# Patient Record
Sex: Male | Born: 1940
Health system: Southern US, Community
[De-identification: ages and names within clinical notes are randomized; demographics above are authoritative.]

## PROBLEM LIST (undated history)

## (undated) DIAGNOSIS — M4300 Spondylolysis, site unspecified: Secondary | ICD-10-CM

## (undated) DIAGNOSIS — M1990 Unspecified osteoarthritis, unspecified site: Secondary | ICD-10-CM

## (undated) DIAGNOSIS — C801 Malignant (primary) neoplasm, unspecified: Secondary | ICD-10-CM

## (undated) DIAGNOSIS — Z87442 Personal history of urinary calculi: Secondary | ICD-10-CM

## (undated) DIAGNOSIS — J189 Pneumonia, unspecified organism: Secondary | ICD-10-CM

## (undated) DIAGNOSIS — K219 Gastro-esophageal reflux disease without esophagitis: Secondary | ICD-10-CM

## (undated) DIAGNOSIS — T4145XA Adverse effect of unspecified anesthetic, initial encounter: Secondary | ICD-10-CM

## (undated) DIAGNOSIS — I2510 Atherosclerotic heart disease of native coronary artery without angina pectoris: Secondary | ICD-10-CM

## (undated) DIAGNOSIS — K469 Unspecified abdominal hernia without obstruction or gangrene: Secondary | ICD-10-CM

## (undated) DIAGNOSIS — Z8489 Family history of other specified conditions: Secondary | ICD-10-CM

## (undated) DIAGNOSIS — N289 Disorder of kidney and ureter, unspecified: Secondary | ICD-10-CM

## (undated) DIAGNOSIS — K5909 Other constipation: Secondary | ICD-10-CM

## (undated) DIAGNOSIS — T8859XA Other complications of anesthesia, initial encounter: Secondary | ICD-10-CM

## (undated) DIAGNOSIS — R519 Headache, unspecified: Secondary | ICD-10-CM

## (undated) DIAGNOSIS — R0600 Dyspnea, unspecified: Secondary | ICD-10-CM

## (undated) DIAGNOSIS — E785 Hyperlipidemia, unspecified: Secondary | ICD-10-CM

## (undated) DIAGNOSIS — I739 Peripheral vascular disease, unspecified: Secondary | ICD-10-CM

## (undated) DIAGNOSIS — E119 Type 2 diabetes mellitus without complications: Secondary | ICD-10-CM

## (undated) DIAGNOSIS — I10 Essential (primary) hypertension: Secondary | ICD-10-CM

## (undated) DIAGNOSIS — R350 Frequency of micturition: Secondary | ICD-10-CM

## (undated) DIAGNOSIS — J449 Chronic obstructive pulmonary disease, unspecified: Secondary | ICD-10-CM

## (undated) HISTORY — DX: Unspecified abdominal hernia without obstruction or gangrene: K46.9

## (undated) HISTORY — DX: Malignant (primary) neoplasm, unspecified: C80.1

## (undated) HISTORY — PX: KIDNEY STONE SURGERY: SHX686

## (undated) HISTORY — PX: NEPHRECTOMY: SHX65

## (undated) HISTORY — DX: Frequency of micturition: R35.0

## (undated) HISTORY — DX: Essential (primary) hypertension: I10

## (undated) HISTORY — DX: Hyperlipidemia, unspecified: E78.5

## (undated) HISTORY — DX: Gastro-esophageal reflux disease without esophagitis: K21.9

## (undated) HISTORY — DX: Type 2 diabetes mellitus without complications: E11.9

## (undated) HISTORY — PX: COLONOSCOPY: SHX174

## (undated) HISTORY — DX: Spondylolysis, site unspecified: M43.00

## (undated) HISTORY — PX: HERNIA REPAIR: SHX51

---
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
## 2009-05-12 DIAGNOSIS — C029 Malignant neoplasm of tongue, unspecified: Secondary | ICD-10-CM

## 2009-05-12 HISTORY — DX: Malignant neoplasm of tongue, unspecified: C02.9

## 2009-12-07 HISTORY — PX: PARTIAL GLOSSECTOMY: SHX2173

## 2011-05-13 DIAGNOSIS — C801 Malignant (primary) neoplasm, unspecified: Secondary | ICD-10-CM

## 2011-05-13 HISTORY — DX: Malignant (primary) neoplasm, unspecified: C80.1

## 2011-10-08 HISTORY — PX: NEPHRECTOMY: SHX65

## 2011-12-29 ENCOUNTER — Encounter: Payer: Self-pay | Admitting: Sports Medicine

## 2011-12-29 ENCOUNTER — Ambulatory Visit (INDEPENDENT_AMBULATORY_CARE_PROVIDER_SITE_OTHER): Payer: Self-pay | Admitting: Sports Medicine

## 2011-12-29 VITALS — BP 111/64 | HR 70 | Temp 97.9°F | Resp 18 | Wt 240.0 lb

## 2011-12-29 DIAGNOSIS — IMO0001 Reserved for inherently not codable concepts without codable children: Secondary | ICD-10-CM

## 2011-12-29 DIAGNOSIS — M47816 Spondylosis without myelopathy or radiculopathy, lumbar region: Secondary | ICD-10-CM | POA: Insufficient documentation

## 2011-12-29 DIAGNOSIS — M5136 Other intervertebral disc degeneration, lumbar region: Secondary | ICD-10-CM | POA: Insufficient documentation

## 2011-12-29 MED ORDER — TRAMADOL-ACETAMINOPHEN 37.5-325 MG PO TABS: 1.0000 | ORAL_TABLET | Freq: Four times a day (QID) | ORAL | Status: AC | PRN

## 2011-12-29 MED ORDER — CYCLOBENZAPRINE HCL 10 MG PO TABS: ORAL_TABLET | ORAL | Status: AC

## 2011-12-29 NOTE — Assessment & Plan Note (Signed)
Muscle spasm, known lumbar spondylosis, I do suspect he has an element of degenerative disc disease as well. Trigger point injection as above. Ultracet for use as needed. Given him some rehabilitation exercises. Also like him to use short-term disability, and avoid heavy lifting for the next 3-4 weeks.  See him back in 3-4 weeks, and if no better we'll talk about MRI for interventional injection planning.
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
## 2012-01-23 ENCOUNTER — Encounter: Payer: Self-pay | Admitting: Sports Medicine

## 2012-01-23 ENCOUNTER — Ambulatory Visit (INDEPENDENT_AMBULATORY_CARE_PROVIDER_SITE_OTHER): Payer: 59 | Admitting: Sports Medicine

## 2012-01-23 VITALS — BP 123/65 | HR 102 | Temp 97.4°F | Wt 236.0 lb

## 2012-01-23 DIAGNOSIS — IMO0001 Reserved for inherently not codable concepts without codable children: Secondary | ICD-10-CM

## 2012-01-23 NOTE — Assessment & Plan Note (Signed)
Pain is gone. He wishes to go back to full-time work. His boss does agree that he will back him off in terms of time, in intensity. He may come back to see me on an as-needed basis, and if his pain comes back, I would recommend an MRI for interventional injection planning.

## 2012-01-23 NOTE — Progress Notes (Signed)
Patient ID: Clinton Ortiz, male   DOB: [birth year], 71 y.o.   MRN: [id number] Subjective:    CC: Followup back pain  HPI: Clinton Ortiz comes back to see me for follow up of back pain. I saw him approximately 4 weeks ago in the several trigger point injections. I also placed him on some analgesics, and gave him some rehabilitation exercises to do. Overall he notes his pain is essentially gone. He is ready to go back to work. He does note, that his boss will allow him to cut back on his hours, and the intensity.  Past medical history, Surgical history, Family history, Social history, Allergies, and medications have been entered into the medical record, reviewed, and no changes needed.   Review of Systems: No fevers, chills, night sweats, weight loss, chest pain, or shortness of breath.   Objective:    General: Well Developed, well nourished, and in no acute distress.  Neuro: Alert and oriented x3, extra-ocular muscles intact.  HEENT: Normocephalic, atraumatic, pupils equal round reactive to light, neck supple, no masses, no lymphadenopathy, thyroid nonpalpable.  Skin: Warm and dry, no rashes. Cardiac: Regular rate and rhythm, no murmurs rubs or gallops.  Respiratory: Clear to auscultation bilaterally. Not using accessory muscles, speaking in full sentences. Back Exam:  Inspection: Unremarkable  Motion: Flexion 45 deg, Extension 45 deg, Side Bending to 45 deg bilaterally,  Rotation to 45 deg bilaterally  SLR laying: Negative  XSLR laying: Negative  Palpable tenderness: None. FABER: negative. Sensory change: Gross sensation intact to all lumbar and sacral dermatomes.  Reflexes: 2+ at both patellar tendons, 2+ at achilles tendons, Babinski's downgoing.  Strength at foot  Plantar-flexion: 5/5 Dorsi-flexion: 5/5 Eversion: 5/5 Inversion: 5/5  Leg strength  Quad: 5/5 Hamstring: 5/5 Hip flexor: 5/5 Hip abductors: 5/5  Gait unremarkable.   Impression and Recommendations:

## 2012-07-03 ENCOUNTER — Emergency Department
Admission: EM | Admit: 2012-07-03 | Discharge: 2012-07-03 | Disposition: A | Discharge status: Home / Self Care | Payer: 59 | Source: Home / Self Care | Attending: Family Medicine | Admitting: Family Medicine

## 2012-07-03 DIAGNOSIS — J069 Acute upper respiratory infection, unspecified: Secondary | ICD-10-CM

## 2012-07-03 MED ORDER — DOXYCYCLINE HYCLATE 100 MG PO CAPS: 100.0000 mg | ORAL_CAPSULE | Freq: Two times a day (BID) | ORAL | Status: DC

## 2012-07-03 MED ORDER — BENZONATATE 100 MG PO CAPS: ORAL_CAPSULE | ORAL | Status: DC

## 2012-07-03 NOTE — ED Notes (Signed)
Started with headache yesterday now noted with sore throat this am

## 2012-07-03 NOTE — ED Provider Notes (Signed)
History     CSN: [id number]  Arrival date & time 07/03/12  1236   First MD Initiated Contact with Patient 07/03/12 1307      Chief Complaint  Patient presents with  . Sore Throat       HPI Comments: Patient developed cold-like symptoms yesterday with sore throat, nasal congestion, mild cough, myalgias, and headache.  He has had two episodes of pneumonia in the past.  The history is provided by the patient.    Past Medical History  Diagnosis Date  . Hyperlipidemia   . Hypertension   . Hernia   . Spondylolysis   . Frequent urination   . Cancer     left kidney    Past Surgical History  Procedure Laterality Date  . Nephrectomy      left  . Hernia repair      Family History  Problem Relation Age of Onset  . Cancer Mother     bladder  . Heart attack Father     History  Substance Use Topics  . Smoking status: Former Smoker -- 1.00 packs/day for 2 years    Types: Cigarettes  . Smokeless tobacco: Not on file  . Alcohol Use: No      Review of Systems + sore throat + cough No pleuritic pain No wheezing + nasal congestion + post-nasal drainage No sinus pain/pressure No itchy/red eyes No earache No hemoptysis + SOB No fever/chills No nausea No vomiting No abdominal pain No diarrhea No urinary symptoms No skin rashes + fatigue + myalgias + headache    Allergies  Prednisone  Home Medications   Current Outpatient Rx  Name  Route  Sig  Dispense  Refill  . traMADol (ULTRAM) 50 MG tablet   Oral   Take 25 mg by mouth every 6 (six) hours as needed for pain.         Marland Kitchen amLODipine-olmesartan (AZOR) 10-40 MG per tablet   Oral   Take 1 tablet by mouth daily.         Marland Kitchen aspirin 81 MG tablet   Oral   Take 81 mg by mouth daily.         . benzonatate (TESSALON) 100 MG capsule      Take one cap at bedtime as necessary for cough   12 capsule   0   . doxycycline (VIBRAMYCIN) 100 MG capsule   Oral   Take 1 capsule (100 mg total) by mouth 2  (two) times daily.   20 capsule   0   . Multiple Vitamins-Minerals (MULTIVITAMIN WITH MINERALS) tablet   Oral   Take 1 tablet by mouth daily.         . pravastatin (PRAVACHOL) 40 MG tablet   Oral   Take 40 mg by mouth daily.         . Tamsulosin HCl (FLOMAX) 0.4 MG CAPS   Oral   Take by mouth.           BP 119/75  Pulse 77  Temp(Src) 97.8 F (36.6 C) (Oral)  Ht 5\' 7"  (1.702 m)  Wt 245 lb 8 oz (111.358 kg)  BMI 38.44 kg/m2  SpO2 96%  Physical Exam Nursing notes and Vital Signs reviewed. Appearance:  Patient appears stated age, and in no acute distress.  Patient is obese (BMI 38.4) Eyes:  Pupils are equal, round, and reactive to light and accomodation.  Extraocular movement is intact.  Conjunctivae are not inflamed  Ears:  Canals normal.  Tympanic membranes normal.  Nose:  Mildly congested turbinates.  No sinus tenderness.  Pharynx:  Uvula slightly edematous and erythematous Neck:  Supple.  Tender shotty posterior nodes are palpated bilaterally  Lungs:  Clear to auscultation.  Breath sounds are equal.  Heart:  Regular rate and rhythm without murmurs, rubs, or gallops.  Abdomen:  Nontender without masses or hepatosplenomegaly.  Bowel sounds are present.  No CVA or flank tenderness.  Extremities:  No edema.  No calf tenderness Skin:  No rash present.   ED Course  Procedures  none  POCT rapid strep test negative    1. Acute upper respiratory infections of unspecified site       MDM  Because of history of pneumonia twice will empirically begin doxycycline (advised to stop taking iron-containing vitamin while taking the doxycycline) Prescription written for Benzonatate (Tessalon) to take at bedtime for night-time cough.  Take plain Mucinex (guaifenesin) twice daily for cough and congestion.  Increase fluid intake, rest. May use Afrin nasal spray (or generic oxymetazoline) twice daily for about 5 days.  Also recommend using saline nasal spray several times daily  and saline nasal irrigation (AYR is a common brand) Stop all antihistamines for now, and other non-prescription cough/cold preparations. Follow-up with family doctor if not improving 7 to 10 days.         Kandra Nicolas, MD 07/03/12 1355

## 2012-09-02 ENCOUNTER — Ambulatory Visit (HOSPITAL_BASED_OUTPATIENT_CLINIC_OR_DEPARTMENT_OTHER)
Admission: RE | Admit: 2012-09-02 | Discharge: 2012-09-02 | Disposition: A | Discharge status: Home / Self Care | Payer: 59 | Source: Ambulatory Visit | Attending: Sports Medicine | Admitting: Sports Medicine

## 2012-09-02 ENCOUNTER — Encounter: Payer: Self-pay | Admitting: Sports Medicine

## 2012-09-02 ENCOUNTER — Ambulatory Visit (INDEPENDENT_AMBULATORY_CARE_PROVIDER_SITE_OTHER): Payer: 59 | Admitting: Sports Medicine

## 2012-09-02 VITALS — BP 137/80 | HR 80 | Wt 248.0 lb

## 2012-09-02 DIAGNOSIS — N5089 Other specified disorders of the male genital organs: Secondary | ICD-10-CM

## 2012-09-02 DIAGNOSIS — E785 Hyperlipidemia, unspecified: Secondary | ICD-10-CM

## 2012-09-02 DIAGNOSIS — C649 Malignant neoplasm of unspecified kidney, except renal pelvis: Secondary | ICD-10-CM | POA: Insufficient documentation

## 2012-09-02 DIAGNOSIS — I10 Essential (primary) hypertension: Secondary | ICD-10-CM

## 2012-09-02 DIAGNOSIS — N433 Hydrocele, unspecified: Secondary | ICD-10-CM | POA: Insufficient documentation

## 2012-09-02 NOTE — Assessment & Plan Note (Signed)
Is clinically resembles a hydrocele, left worse than right. Certainly with his history he needs a testicular ultrasound. He denies any constitutional symptoms to suggest cancer.

## 2012-09-02 NOTE — Progress Notes (Signed)
  Subjective:    CC: Scrotal swelling  HPI: Clinton Ortiz is a very pleasant 72 year old male with a history of unilateral nephrectomy for renal cancer who comes in with a several year history of on and off swelling in his scrotum. He denies any pain, notes that it is not worse with heavy lifting, or with Valsalva. The swelling is localized, somewhat worse on the left side. He denies any trauma. He has no problem urinating, with the exception of his known obstructive uropathy. Denies any constitutional symptoms, fevers, chills. No penile discharge, no dysuria. No flank pain.   Past medical history, Surgical history, Family history not pertinant except as noted below, Social history, Allergies, and medications have been entered into the medical record, reviewed, and no changes needed.   Review of Systems: No fevers, chills, night sweats, weight loss, chest pain, or shortness of breath.   Objective:    General: Well Developed, well nourished, and in no acute distress.  Neuro: Alert and oriented x3, extra-ocular muscles intact, sensation grossly intact.  HEENT: Normocephalic, atraumatic, pupils equal round reactive to light, neck supple, no masses, no lymphadenopathy, thyroid nonpalpable.  Skin: Warm and dry, no rashes. Cardiac: Regular rate and rhythm, no murmurs rubs or gallops, no lower extremity edema.  Respiratory: Clear to auscultation bilaterally. Not using accessory muscles, speaking in full sentences. Scrotum: Diffusely swollen, left worse than right, feels as though there is a hydrocele. There is no verrucous heel, and I cannot elicit any tenderness over the testes or epididymis. There is no hernia palpable with palpation in the internal inguinal ring, or directly over the anterior lower abdomen during Valsalva. There is no penile discharge. No other lesions, and no inguinal lymphadenopathy.  Impression and Recommendations:

## 2012-09-03 ENCOUNTER — Telehealth: Payer: Self-pay | Admitting: *Deleted

## 2012-09-03 NOTE — Telephone Encounter (Signed)
He was already called on 09/03/2012 at 2:20 PM. He was informed that he does have a urologist, and should he desire surgical correction he can either make an appointment, or we can do a referral for him.

## 2012-09-03 NOTE — Telephone Encounter (Signed)
Pt called requesting [place] results. Please advise.

## 2012-09-21 ENCOUNTER — Encounter: Payer: Self-pay | Admitting: Sports Medicine

## 2012-10-15 ENCOUNTER — Encounter: Payer: Self-pay | Admitting: Sports Medicine

## 2013-02-07 DIAGNOSIS — C649 Malignant neoplasm of unspecified kidney, except renal pelvis: Secondary | ICD-10-CM | POA: Diagnosis not present

## 2013-02-07 DIAGNOSIS — N183 Chronic kidney disease, stage 3 unspecified: Secondary | ICD-10-CM | POA: Diagnosis not present

## 2013-02-08 DIAGNOSIS — C649 Malignant neoplasm of unspecified kidney, except renal pelvis: Secondary | ICD-10-CM | POA: Diagnosis not present

## 2013-02-08 DIAGNOSIS — N183 Chronic kidney disease, stage 3 unspecified: Secondary | ICD-10-CM | POA: Diagnosis not present

## 2013-02-14 ENCOUNTER — Ambulatory Visit (INDEPENDENT_AMBULATORY_CARE_PROVIDER_SITE_OTHER): Payer: 59 | Admitting: Sports Medicine

## 2013-02-14 VITALS — BP 135/81 | HR 75 | Wt 256.0 lb

## 2013-02-14 DIAGNOSIS — IMO0001 Reserved for inherently not codable concepts without codable children: Secondary | ICD-10-CM | POA: Diagnosis not present

## 2013-02-14 DIAGNOSIS — M542 Cervicalgia: Secondary | ICD-10-CM | POA: Diagnosis not present

## 2013-02-14 DIAGNOSIS — D1779 Benign lipomatous neoplasm of other sites: Secondary | ICD-10-CM

## 2013-02-14 DIAGNOSIS — I10 Essential (primary) hypertension: Secondary | ICD-10-CM | POA: Diagnosis not present

## 2013-02-14 DIAGNOSIS — D1720 Benign lipomatous neoplasm of skin and subcutaneous tissue of unspecified limb: Secondary | ICD-10-CM | POA: Insufficient documentation

## 2013-02-14 DIAGNOSIS — Z23 Encounter for immunization: Secondary | ICD-10-CM

## 2013-02-14 DIAGNOSIS — Z299 Encounter for prophylactic measures, unspecified: Secondary | ICD-10-CM

## 2013-02-14 DIAGNOSIS — Z0000 Encounter for general adult medical examination without abnormal findings: Secondary | ICD-10-CM | POA: Insufficient documentation

## 2013-02-14 MED ORDER — DEXAMETHASONE 4 MG PO TABS: 4.0000 mg | ORAL_TABLET | Freq: Two times a day (BID) | ORAL | Status: DC

## 2013-02-14 NOTE — Assessment & Plan Note (Addendum)
Symptoms are likely related to lumbar degenerative disc disease, with left-sided paraspinal spasm. Home exercises, allergic to prednisone, we will use dexamethasone, he already has some tramadol. X-rays. Return in one month, we can consider formal physical therapy if no better.

## 2013-02-14 NOTE — Assessment & Plan Note (Signed)
Influenza and tetanus vaccines given. Last colonoscopy was approximately 5 years ago.

## 2013-02-14 NOTE — Assessment & Plan Note (Signed)
Well controlled, no changes 

## 2013-02-14 NOTE — Progress Notes (Signed)
  Subjective:    CC: Pain  HPI: Lump on forearm: Right-sided, present for approximately 4 months unchanged, nontender. No constitutional symptoms.  Neck and shoulder pain: Pain travels from the left upper neck, into the left upper shoulder, and down the back and the exact distribution of the trapezius. Worse when looking down for long periods of time, and with leftward gaze. Symptoms are moderate, persistent, no radicular symptoms into the hand or arm.  Past medical history, Surgical history, Family history not pertinant except as noted below, Social history, Allergies, and medications have been entered into the medical record, reviewed, and no changes needed.   Review of Systems: No fevers, chills, night sweats, weight loss, chest pain, or shortness of breath.   Objective:    General: Well Developed, well nourished, and in no acute distress.  Neuro: Alert and oriented x3, extra-ocular muscles intact, sensation grossly intact.  HEENT: Normocephalic, atraumatic, pupils equal round reactive to light, neck supple, no masses, no lymphadenopathy, thyroid nonpalpable.  Skin: Warm and dry, no rashes. Cardiac: Regular rate and rhythm, no murmurs rubs or gallops, no lower extremity edema.  Respiratory: Clear to auscultation bilaterally. Not using accessory muscles, speaking in full sentences. Neck: Inspection unremarkable. No palpable stepoffs. Negative Spurling's maneuver. Full neck range of motion Grip strength and sensation normal in bilateral hands Strength good C4 to T1 distribution No sensory change to C4 to T1 Negative Hoffman sign bilaterally Reflexes normal Right arm: There is a soft, movable, well-defined mass approximately 2 cm across it is palpable in the forearm. This has the consistency of a lipoma.  Impression and Recommendations:

## 2013-02-14 NOTE — Assessment & Plan Note (Signed)
Currently resolved with conservative measures.

## 2013-02-14 NOTE — Assessment & Plan Note (Signed)
Benign, has not changed in 4 months. He will keep an eye on this.

## 2013-02-16 DIAGNOSIS — G4700 Insomnia, unspecified: Secondary | ICD-10-CM | POA: Diagnosis not present

## 2013-02-16 DIAGNOSIS — M542 Cervicalgia: Secondary | ICD-10-CM | POA: Diagnosis not present

## 2013-02-16 DIAGNOSIS — E785 Hyperlipidemia, unspecified: Secondary | ICD-10-CM | POA: Diagnosis not present

## 2013-02-16 DIAGNOSIS — R7301 Impaired fasting glucose: Secondary | ICD-10-CM | POA: Diagnosis not present

## 2013-02-16 DIAGNOSIS — M1990 Unspecified osteoarthritis, unspecified site: Secondary | ICD-10-CM | POA: Diagnosis not present

## 2013-02-16 DIAGNOSIS — I10 Essential (primary) hypertension: Secondary | ICD-10-CM | POA: Diagnosis not present

## 2013-02-16 DIAGNOSIS — N183 Chronic kidney disease, stage 3 unspecified: Secondary | ICD-10-CM | POA: Diagnosis not present

## 2013-02-21 DIAGNOSIS — K7689 Other specified diseases of liver: Secondary | ICD-10-CM | POA: Diagnosis not present

## 2013-02-21 DIAGNOSIS — C649 Malignant neoplasm of unspecified kidney, except renal pelvis: Secondary | ICD-10-CM | POA: Diagnosis not present

## 2013-02-21 DIAGNOSIS — Z905 Acquired absence of kidney: Secondary | ICD-10-CM | POA: Diagnosis not present

## 2013-02-21 DIAGNOSIS — N289 Disorder of kidney and ureter, unspecified: Secondary | ICD-10-CM | POA: Diagnosis not present

## 2013-03-14 ENCOUNTER — Encounter: Payer: Self-pay | Admitting: Sports Medicine

## 2013-03-14 ENCOUNTER — Ambulatory Visit (INDEPENDENT_AMBULATORY_CARE_PROVIDER_SITE_OTHER): Payer: Medicare Other | Admitting: Sports Medicine

## 2013-03-14 ENCOUNTER — Ambulatory Visit (INDEPENDENT_AMBULATORY_CARE_PROVIDER_SITE_OTHER): Payer: Medicare Other

## 2013-03-14 VITALS — BP 127/81 | HR 87 | Wt 255.0 lb

## 2013-03-14 DIAGNOSIS — M542 Cervicalgia: Secondary | ICD-10-CM

## 2013-03-14 DIAGNOSIS — M5030 Other cervical disc degeneration, unspecified cervical region: Secondary | ICD-10-CM | POA: Diagnosis not present

## 2013-03-14 DIAGNOSIS — M47812 Spondylosis without myelopathy or radiculopathy, cervical region: Secondary | ICD-10-CM

## 2013-03-14 MED ORDER — DEXAMETHASONE 4 MG PO TABS: 4.0000 mg | ORAL_TABLET | Freq: Two times a day (BID) | ORAL | Status: DC

## 2013-03-14 NOTE — Assessment & Plan Note (Signed)
Persistent pain on the left side in the C6 distribution over the dorsal scapular region. Unfortunately he did not pick up any of the medicines I prescribed. Continue home PT, I will re call in the medications, he also needs to get some x-rays. I like to see him back in about 2 weeks to see how things are going.

## 2013-03-14 NOTE — Progress Notes (Signed)
  Subjective:    CC: Followup  HPI: Clinton Ortiz is a very pleasant 72 year old male, I saw him approximately a month ago, he had pain in his neck radiating over his right scapula, and causing weakness in his right hand. We suspected cervical radiculitis and I gave him some medications, and asked him to get some x-rays and do some home exercises. He returns, he did not pick up his medicines, he did not get his x-rays, he has been doing home rehabilitation, but only occasionally. Symptoms continued to cause pain radiating over the dorsal scapular distribution. It is moderate, persistent.  Past medical history, Surgical history, Family history not pertinant except as noted below, Social history, Allergies, and medications have been entered into the medical record, reviewed, and no changes needed.   Review of Systems: No fevers, chills, night sweats, weight loss, chest pain, or shortness of breath.   Objective:    General: Well Developed, well nourished, and in no acute distress.  Neuro: Alert and oriented x3, extra-ocular muscles intact, sensation grossly intact.  HEENT: Normocephalic, atraumatic, pupils equal round reactive to light, neck supple, no masses, no lymphadenopathy, thyroid nonpalpable.  Skin: Warm and dry, no rashes. Cardiac: Regular rate and rhythm, no murmurs rubs or gallops, no lower extremity edema.  Respiratory: Clear to auscultation bilaterally. Not using accessory muscles, speaking in full sentences.  Impression and Recommendations:

## 2013-03-28 ENCOUNTER — Ambulatory Visit (INDEPENDENT_AMBULATORY_CARE_PROVIDER_SITE_OTHER): Payer: Medicare Other | Admitting: Sports Medicine

## 2013-03-28 ENCOUNTER — Encounter: Payer: Self-pay | Admitting: Sports Medicine

## 2013-03-28 VITALS — BP 116/85 | HR 108 | Wt 247.0 lb

## 2013-03-28 DIAGNOSIS — M542 Cervicalgia: Secondary | ICD-10-CM

## 2013-03-28 NOTE — Assessment & Plan Note (Signed)
Essentially resolved with dexamethasone and home exercises. Return to see me on an as-needed basis. He did have multilevel cervical spondylosis on x-ray.

## 2013-03-28 NOTE — Progress Notes (Signed)
  Subjective:    CC: Follow up  HPI: C6 cervical radiculitis: Periscapular, essentially resolved with dexamethasone, and home exercises.  Past medical history, Surgical history, Family history not pertinant except as noted below, Social history, Allergies, and medications have been entered into the medical record, reviewed, and no changes needed.   Review of Systems: No fevers, chills, night sweats, weight loss, chest pain, or shortness of breath.   Objective:    General: Well Developed, well nourished, and in no acute distress.  Neuro: Alert and oriented x3, extra-ocular muscles intact, sensation grossly intact.  HEENT: Normocephalic, atraumatic, pupils equal round reactive to light, neck supple, no masses, no lymphadenopathy, thyroid nonpalpable.  Skin: Warm and dry, no rashes. Cardiac: Regular rate and rhythm, no murmurs rubs or gallops, no lower extremity edema.  Respiratory: Clear to auscultation bilaterally. Not using accessory muscles, speaking in full sentences. Neck: Inspection unremarkable. No palpable stepoffs. Negative Spurling's maneuver. Full neck range of motion Grip strength and sensation normal in bilateral hands Strength good C4 to T1 distribution No sensory change to C4 to T1 Negative Hoffman sign bilaterally Reflexes normal Impression and Recommendations:

## 2013-06-14 DIAGNOSIS — C021 Malignant neoplasm of border of tongue: Secondary | ICD-10-CM | POA: Diagnosis not present

## 2013-06-14 DIAGNOSIS — Z85810 Personal history of malignant neoplasm of tongue: Secondary | ICD-10-CM | POA: Diagnosis not present

## 2013-06-16 DIAGNOSIS — E041 Nontoxic single thyroid nodule: Secondary | ICD-10-CM | POA: Diagnosis not present

## 2013-06-21 DIAGNOSIS — M1990 Unspecified osteoarthritis, unspecified site: Secondary | ICD-10-CM | POA: Diagnosis not present

## 2013-06-21 DIAGNOSIS — N184 Chronic kidney disease, stage 4 (severe): Secondary | ICD-10-CM | POA: Diagnosis not present

## 2013-06-21 DIAGNOSIS — E785 Hyperlipidemia, unspecified: Secondary | ICD-10-CM | POA: Diagnosis not present

## 2013-06-21 DIAGNOSIS — R351 Nocturia: Secondary | ICD-10-CM | POA: Diagnosis not present

## 2013-06-21 DIAGNOSIS — Z125 Encounter for screening for malignant neoplasm of prostate: Secondary | ICD-10-CM | POA: Diagnosis not present

## 2013-06-21 DIAGNOSIS — M542 Cervicalgia: Secondary | ICD-10-CM | POA: Diagnosis not present

## 2013-06-21 DIAGNOSIS — I10 Essential (primary) hypertension: Secondary | ICD-10-CM | POA: Diagnosis not present

## 2013-06-21 DIAGNOSIS — R7301 Impaired fasting glucose: Secondary | ICD-10-CM | POA: Diagnosis not present

## 2013-06-21 DIAGNOSIS — Z0000 Encounter for general adult medical examination without abnormal findings: Secondary | ICD-10-CM | POA: Diagnosis not present

## 2013-06-21 DIAGNOSIS — R3589 Other polyuria: Secondary | ICD-10-CM | POA: Diagnosis not present

## 2013-06-21 DIAGNOSIS — C029 Malignant neoplasm of tongue, unspecified: Secondary | ICD-10-CM | POA: Diagnosis not present

## 2013-06-21 DIAGNOSIS — R358 Other polyuria: Secondary | ICD-10-CM | POA: Diagnosis not present

## 2013-08-03 ENCOUNTER — Telehealth: Payer: Self-pay | Admitting: *Deleted

## 2013-08-03 NOTE — Telephone Encounter (Signed)
Daughter will come by to pickup disc.  Oscar La, LPN

## 2013-08-03 NOTE — Telephone Encounter (Signed)
Daughter is calling stating they need the CD's from previous scan they brought in for you to look at. She is wanting to pick them up because pt has appt on Friday with spine surgeon. Do you know where the disk are placed or if you still have them so I can call her to pick them up.  Oscar La, LPN

## 2013-08-05 DIAGNOSIS — IMO0002 Reserved for concepts with insufficient information to code with codable children: Secondary | ICD-10-CM | POA: Diagnosis not present

## 2013-08-15 DIAGNOSIS — M47817 Spondylosis without myelopathy or radiculopathy, lumbosacral region: Secondary | ICD-10-CM | POA: Diagnosis not present

## 2013-08-22 DIAGNOSIS — M48061 Spinal stenosis, lumbar region without neurogenic claudication: Secondary | ICD-10-CM | POA: Diagnosis not present

## 2013-08-29 DIAGNOSIS — M545 Low back pain, unspecified: Secondary | ICD-10-CM | POA: Diagnosis not present

## 2013-09-01 ENCOUNTER — Ambulatory Visit (INDEPENDENT_AMBULATORY_CARE_PROVIDER_SITE_OTHER): Payer: Medicare Other | Admitting: Sports Medicine

## 2013-09-01 ENCOUNTER — Encounter: Payer: Self-pay | Admitting: Sports Medicine

## 2013-09-01 VITALS — BP 147/88 | HR 77 | Ht 66.0 in | Wt 272.0 lb

## 2013-09-01 DIAGNOSIS — IMO0001 Reserved for inherently not codable concepts without codable children: Secondary | ICD-10-CM

## 2013-09-01 NOTE — Progress Notes (Signed)
Patient ID: Clinton Ortiz, male   DOB: 1940-05-25, 73 y.o.   MRN: [id number]  Subjective:    CC: low back pain  HPI:  Patient is a pleasant 73 yo man who presents today for chronic lower back pain.  His pain is localized over the middle of his lower back with no radicular symptoms.  His pain is worsened with extension and activity.  He was seen at [place] and MRI was ordered.  It showed facet joint arthritis and a facet injection was recommended.  He was scheduled for facet injection with Dr. Mina Marble on May 19th, however wants to have it done sooner as he is going on vacation May 11th.  He is here today to find a sooner appointment for facet injection.  Past medical history, Surgical history, Family history not pertinant except as noted below, Social history, Allergies, and medications have been entered into the medical record, reviewed, and no changes needed.   Review of Systems: No fevers, chills, night sweats, weight loss, chest pain, or shortness of breath.   Objective:    General: Well Developed, well nourished, and in no acute distress.  Neuro: Alert and oriented x3, extra-ocular muscles intact, sensation grossly intact.  HEENT: Normocephalic, atraumatic, pupils equal round reactive to light, neck supple, no masses, no lymphadenopathy, thyroid nonpalpable.  Skin: Warm and dry, no rashes. Cardiac: Regular rate and rhythm, no murmurs rubs or gallops, no lower extremity edema.  Respiratory: Clear to auscultation bilaterally. Not using accessory muscles, speaking in full sentences. Back Exam:  Inspection: Unremarkable  Motion: Flexion 45 deg, Extension 45 deg, Side Bending to 45 deg bilaterally,  Rotation to 45 deg bilaterally  SLR laying: Negative  XSLR laying: Negative  Palpable tenderness: None. FABER: negative. Sensory change: Gross sensation intact to all lumbar and sacral dermatomes.  Reflexes: 2+ at both patellar tendons, 2+ at achilles tendons, Babinski's downgoing.    Strength at foot  Plantar-flexion: 5/5 Dorsi-flexion: 5/5 Eversion: 5/5 Inversion: 5/5  Leg strength  Quad: 5/5 Hamstring: 5/5 Hip flexor: 5/5 Hip abductors: 5/5  Gait unremarkable.  Impression and Recommendations:    Patient with known facet arthritis in his lumbar spine here to schedule facet injection before May 11th.  However we do not have MRI here to review.  I prefer to personally review the MRI prior to ordering injection so as to most accurately guide the procedure.  -return tomorrow w/ MRI disc -schedule facet injection after review of MRI

## 2013-09-01 NOTE — Assessment & Plan Note (Signed)
Degenerative disc disease at L5-S1 as well as multilevel facet spondylosis on x-rays, I do not have his MRI for review. It sounds as though he has gone to [facility], Dr. Mina Marble recommended facet joint injections. He would like these done as soon as possible, and it takes too long to get in with Dr. Mina Marble. I have asked him to get his MRI disc, bring it, and come see me tomorrow for me to review. Based on my review of his MRI I can order facet injections which can be done very quickly. I will see him back tomorrow.

## 2013-09-02 ENCOUNTER — Ambulatory Visit (INDEPENDENT_AMBULATORY_CARE_PROVIDER_SITE_OTHER): Payer: Medicare Other | Admitting: Sports Medicine

## 2013-09-02 ENCOUNTER — Encounter: Payer: Self-pay | Admitting: Sports Medicine

## 2013-09-02 VITALS — BP 139/86 | HR 95 | Ht 67.0 in | Wt 273.0 lb

## 2013-09-02 DIAGNOSIS — IMO0002 Reserved for concepts with insufficient information to code with codable children: Secondary | ICD-10-CM | POA: Diagnosis not present

## 2013-09-02 DIAGNOSIS — M5134 Other intervertebral disc degeneration, thoracic region: Secondary | ICD-10-CM | POA: Insufficient documentation

## 2013-09-02 DIAGNOSIS — IMO0001 Reserved for inherently not codable concepts without codable children: Secondary | ICD-10-CM | POA: Diagnosis not present

## 2013-09-02 NOTE — Assessment & Plan Note (Signed)
With left-sided lower thoracic radicular symptoms and overlying spasm. L-spine x-rays did extend to the lower thoracic spine and do show significant degenerative changes. I'm going to order an MRI of the thoracic spine, he should come back to see me to go over results and plan intervention. His pain does sound predominantly discogenic today.

## 2013-09-02 NOTE — Assessment & Plan Note (Addendum)
Clinton Ortiz MRI shows some severe pathology however his pain seems to be in the lower thoracic spine.  I am going to order a left L4/5 interlaminar epidural, I do think he does have some pathology in the lower thoracic spine. I am also order an MRI of his thoracic spine for further interventional planning. Interestingly his L5-S1 level appears completely congenitally fused. The amount of calcification in the anterior and posterior longitudinal ligament, as well as spurring as well as calcification in the L5-S1 disc do make me suspect that diffuse idiopathic skeletal hyperostosis may be at work here.

## 2013-09-02 NOTE — Progress Notes (Signed)
  Subjective:    CC: Followup  HPI: I saw Clinton Ortiz yesterday for his back pain, yesterday he described it is predominantly discogenic. Today he describes it worse around the low back, worse with sitting, without radicular symptoms. It's worse with sneezing and Valsalva. He also endorses pain at the lower thoracic spine and the left side with radiation around the left lower chest. This is also moderate persistent. He tells me he has not had any prior back surgery.  Past medical history, Surgical history, Family history not pertinant except as noted below, Social history, Allergies, and medications have been entered into the medical record, reviewed, and no changes needed.   Review of Systems: No fevers, chills, night sweats, weight loss, chest pain, or shortness of breath.   Objective:    General: Well Developed, well nourished, and in no acute distress.  Neuro: Alert and oriented x3, extra-ocular muscles intact, sensation grossly intact.  HEENT: Normocephalic, atraumatic, pupils equal round reactive to light, neck supple, no masses, no lymphadenopathy, thyroid nonpalpable.  Skin: Warm and dry, no rashes. Cardiac: Regular rate and rhythm, no murmurs rubs or gallops, no lower extremity edema.  Respiratory: Clear to auscultation bilaterally. Not using accessory muscles, speaking in full sentences.  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 There is also multilevel degenerative changes worse at the L4-L5 level, with multilevel lower facet arthrosis. I am unable to visualize much of the thoracic spine on MRI however x-rays do show significant lower thoracic degenerative disc disease. The anterior osteophytes as well as calcification spurring is significant enough where I would consider a diagnosis of diffuse idiopathic skeletal hyperostosis.  Impression and  Recommendations:

## 2013-09-05 ENCOUNTER — Other Ambulatory Visit: Payer: Medicare Other

## 2013-09-06 ENCOUNTER — Ambulatory Visit
Admission: RE | Admit: 2013-09-06 | Discharge: 2013-09-06 | Disposition: A | Discharge status: Home / Self Care | Payer: Medicare Other | Source: Ambulatory Visit | Attending: Sports Medicine | Admitting: Sports Medicine

## 2013-09-06 VITALS — BP 166/91 | HR 72

## 2013-09-06 DIAGNOSIS — M5134 Other intervertebral disc degeneration, thoracic region: Secondary | ICD-10-CM

## 2013-09-06 DIAGNOSIS — IMO0002 Reserved for concepts with insufficient information to code with codable children: Secondary | ICD-10-CM | POA: Diagnosis not present

## 2013-09-06 MED ORDER — IOHEXOL 180 MG/ML  SOLN
1.0000 mL | Freq: Once | INTRAMUSCULAR | Status: AC | PRN
  Administered 2013-09-06: 1 mL via EPIDURAL

## 2013-09-06 MED ORDER — METHYLPREDNISOLONE ACETATE 40 MG/ML INJ SUSP (RADIOLOG
120.0000 mg | Freq: Once | INTRAMUSCULAR | Status: AC
  Administered 2013-09-06: 120 mg via EPIDURAL

## 2013-09-06 NOTE — Discharge Instructions (Signed)

## 2013-09-08 ENCOUNTER — Telehealth: Payer: Self-pay

## 2013-09-08 NOTE — Telephone Encounter (Signed)
error 

## 2013-09-10 ENCOUNTER — Ambulatory Visit (HOSPITAL_BASED_OUTPATIENT_CLINIC_OR_DEPARTMENT_OTHER)
Admission: RE | Admit: 2013-09-10 | Discharge: 2013-09-10 | Disposition: A | Discharge status: Home / Self Care | Payer: Medicare Other | Source: Ambulatory Visit | Attending: Sports Medicine | Admitting: Sports Medicine

## 2013-09-10 DIAGNOSIS — M5134 Other intervertebral disc degeneration, thoracic region: Secondary | ICD-10-CM

## 2013-09-10 DIAGNOSIS — IMO0002 Reserved for concepts with insufficient information to code with codable children: Secondary | ICD-10-CM | POA: Diagnosis not present

## 2013-09-10 DIAGNOSIS — M5030 Other cervical disc degeneration, unspecified cervical region: Secondary | ICD-10-CM | POA: Diagnosis not present

## 2013-09-10 DIAGNOSIS — Z859 Personal history of malignant neoplasm, unspecified: Secondary | ICD-10-CM | POA: Diagnosis not present

## 2013-09-23 ENCOUNTER — Ambulatory Visit: Payer: Medicare Other | Admitting: Sports Medicine

## 2013-09-27 DIAGNOSIS — M545 Low back pain, unspecified: Secondary | ICD-10-CM | POA: Diagnosis not present

## 2013-09-27 DIAGNOSIS — E785 Hyperlipidemia, unspecified: Secondary | ICD-10-CM | POA: Diagnosis not present

## 2013-09-27 DIAGNOSIS — G4700 Insomnia, unspecified: Secondary | ICD-10-CM | POA: Diagnosis not present

## 2013-09-27 DIAGNOSIS — R7301 Impaired fasting glucose: Secondary | ICD-10-CM | POA: Diagnosis not present

## 2013-09-27 DIAGNOSIS — Z5181 Encounter for therapeutic drug level monitoring: Secondary | ICD-10-CM | POA: Diagnosis not present

## 2013-09-27 DIAGNOSIS — N184 Chronic kidney disease, stage 4 (severe): Secondary | ICD-10-CM | POA: Diagnosis not present

## 2013-09-27 DIAGNOSIS — I10 Essential (primary) hypertension: Secondary | ICD-10-CM | POA: Diagnosis not present

## 2013-09-30 ENCOUNTER — Ambulatory Visit: Payer: Medicare Other | Admitting: Sports Medicine

## 2013-10-10 DIAGNOSIS — Z85528 Personal history of other malignant neoplasm of kidney: Secondary | ICD-10-CM | POA: Diagnosis not present

## 2013-10-10 DIAGNOSIS — C649 Malignant neoplasm of unspecified kidney, except renal pelvis: Secondary | ICD-10-CM | POA: Diagnosis not present

## 2013-10-27 DIAGNOSIS — L219 Seborrheic dermatitis, unspecified: Secondary | ICD-10-CM | POA: Diagnosis not present

## 2013-10-27 DIAGNOSIS — L578 Other skin changes due to chronic exposure to nonionizing radiation: Secondary | ICD-10-CM | POA: Diagnosis not present

## 2013-10-27 DIAGNOSIS — L570 Actinic keratosis: Secondary | ICD-10-CM | POA: Diagnosis not present

## 2013-10-27 DIAGNOSIS — L821 Other seborrheic keratosis: Secondary | ICD-10-CM | POA: Diagnosis not present

## 2013-12-13 DIAGNOSIS — C021 Malignant neoplasm of border of tongue: Secondary | ICD-10-CM | POA: Diagnosis not present

## 2013-12-28 ENCOUNTER — Ambulatory Visit (INDEPENDENT_AMBULATORY_CARE_PROVIDER_SITE_OTHER): Payer: Medicare Other | Admitting: Sports Medicine

## 2013-12-28 ENCOUNTER — Encounter: Payer: Self-pay | Admitting: Sports Medicine

## 2013-12-28 VITALS — BP 144/89 | HR 78 | Ht 67.0 in | Wt 271.0 lb

## 2013-12-28 DIAGNOSIS — E785 Hyperlipidemia, unspecified: Secondary | ICD-10-CM | POA: Diagnosis not present

## 2013-12-28 DIAGNOSIS — Z79899 Other long term (current) drug therapy: Secondary | ICD-10-CM

## 2013-12-28 DIAGNOSIS — N400 Enlarged prostate without lower urinary tract symptoms: Secondary | ICD-10-CM | POA: Diagnosis not present

## 2013-12-28 DIAGNOSIS — I10 Essential (primary) hypertension: Secondary | ICD-10-CM | POA: Diagnosis not present

## 2013-12-28 DIAGNOSIS — IMO0001 Reserved for inherently not codable concepts without codable children: Secondary | ICD-10-CM | POA: Diagnosis not present

## 2013-12-28 DIAGNOSIS — H6120 Impacted cerumen, unspecified ear: Secondary | ICD-10-CM

## 2013-12-28 DIAGNOSIS — H6122 Impacted cerumen, left ear: Secondary | ICD-10-CM

## 2013-12-28 DIAGNOSIS — E119 Type 2 diabetes mellitus without complications: Secondary | ICD-10-CM

## 2013-12-28 DIAGNOSIS — E669 Obesity, unspecified: Secondary | ICD-10-CM

## 2013-12-28 DIAGNOSIS — E1169 Type 2 diabetes mellitus with other specified complication: Secondary | ICD-10-CM

## 2013-12-28 MED ORDER — LIRAGLUTIDE -WEIGHT MANAGEMENT 18 MG/3ML ~~LOC~~ SOPN: 3.0000 mg | PEN_INJECTOR | Freq: Every day | SUBCUTANEOUS | Status: DC

## 2013-12-28 MED ORDER — TAMSULOSIN HCL 0.4 MG PO CAPS: 0.8000 mg | ORAL_CAPSULE | Freq: Every day | ORAL | Status: DC

## 2013-12-28 MED ORDER — TADALAFIL 5 MG PO TABS: 5.0000 mg | ORAL_TABLET | Freq: Every day | ORAL | Status: DC | PRN

## 2013-12-28 NOTE — Assessment & Plan Note (Signed)
Excellent response to a left-sided L4-L5 interlaminar epidural. Repeating injection.

## 2013-12-28 NOTE — Assessment & Plan Note (Signed)
Increasing Flomax 0.4 mg, continuing to have some nocturia. Also adding Cialis for BPH.

## 2013-12-28 NOTE — Assessment & Plan Note (Signed)
Nutrition referral, starting

## 2013-12-28 NOTE — Progress Notes (Signed)
  Subjective:    CC: Switch to me as PCP  HPI: Hyperlipidemia: Stable  Hypertension: Slightly elevated but asymptomatic.  Lumbar degenerative disc disease: Worse at the L4-L5 level, had an excellent response to left-sided L4-L5 interlaminar epidural. Desires repeat.  Obstructive uropathy: Minimal response to 0.4 mg of Flomax, amenable to increasing dose.  Obesity: Desires weight loss medication and nutrition referral.  Past medical history, Surgical history, Family history not pertinant except as noted below, Social history, Allergies, and medications have been entered into the medical record, reviewed, and no changes needed.   Review of Systems: No fevers, chills, night sweats, weight loss, chest pain, or shortness of breath.   Objective:    General: Well Developed, well nourished, and in no acute distress.  Neuro: Alert and oriented x3, extra-ocular muscles intact, sensation grossly intact.  HEENT: Normocephalic, atraumatic, pupils equal round reactive to light, neck supple, no masses, no lymphadenopathy, thyroid nonpalpable. Bilateral cerumen impaction Skin: Warm and dry, no rashes. Cardiac: Regular rate and rhythm, no murmurs rubs or gallops, no lower extremity edema.  Respiratory: Clear to auscultation bilaterally. Not using accessory muscles, speaking in full sentences.  Indication: Cerumen impaction of the ear(s) Medical necessity statement: On physical examination, cerumen impairs clinically significant portions of the external auditory canal, and tympanic membrane. Noted obstructive, copious cerumen that cannot be removed without magnification and instrumentations requiring physician skills Consent: Discussed benefits and risks of procedure and verbal consent obtained Procedure: Patient was prepped for the procedure. Utilized an otoscope to assess and take note of the ear canal, the tympanic membrane, and the presence, amount, and placement of the cerumen. Gentle water  irrigation and soft plastic curette was utilized to remove cerumen.  Post procedure examination: shows cerumen was completely removed. Patient tolerated procedure well. The patient is made aware that they may experience temporary vertigo, temporary hearing loss, and temporary discomfort. If these symptom last for more than 24 hours to call the clinic or proceed to the ED.  Impression and Recommendations:

## 2013-12-28 NOTE — Assessment & Plan Note (Signed)
Left-sided, removed with both irrigation and physician curettage.

## 2013-12-28 NOTE — Assessment & Plan Note (Signed)
Continue pravastatin, rechecking lipids.

## 2013-12-28 NOTE — Assessment & Plan Note (Signed)
Under moderate control, continue Benicar, Lasix, amlodipine. I am increasing his Flomax for BPH which will probably lower his blood pressure.

## 2013-12-29 DIAGNOSIS — Z79899 Other long term (current) drug therapy: Secondary | ICD-10-CM | POA: Diagnosis not present

## 2013-12-29 DIAGNOSIS — I10 Essential (primary) hypertension: Secondary | ICD-10-CM | POA: Diagnosis not present

## 2013-12-29 LAB — HEMOGLOBIN A1C
Hgb A1c MFr Bld: 7.2 % — ABNORMAL HIGH (ref <5.7)
Mean Plasma Glucose: 160 mg/dL — ABNORMAL HIGH (ref <117)

## 2013-12-29 LAB — LIPID PANEL
Cholesterol: 149 mg/dL (ref 0–200)
HDL: 31 mg/dL — ABNORMAL LOW (ref >39)
LDL Cholesterol: 80 mg/dL (ref 0–99)
Total CHOL/HDL Ratio: 4.8 Ratio
Triglycerides: 190 mg/dL — ABNORMAL HIGH (ref <150)
VLDL: 38 mg/dL (ref 0–40)

## 2013-12-29 LAB — COMPREHENSIVE METABOLIC PANEL WITH GFR
ALT: 29 U/L (ref 0–53)
AST: 21 U/L (ref 0–37)
CO2: 24 meq/L (ref 19–32)
Sodium: 141 meq/L (ref 135–145)
Total Bilirubin: 0.6 mg/dL (ref 0.2–1.2)
Total Protein: 6.3 g/dL (ref 6.0–8.3)

## 2013-12-29 LAB — COMPREHENSIVE METABOLIC PANEL
Albumin: 4.3 g/dL (ref 3.5–5.2)
Alkaline Phosphatase: 93 U/L (ref 39–117)
BUN: 23 mg/dL (ref 6–23)
Calcium: 9.1 mg/dL (ref 8.4–10.5)
Chloride: 108 mEq/L (ref 96–112)
Creat: 1.83 mg/dL — ABNORMAL HIGH (ref 0.50–1.35)
Glucose, Bld: 131 mg/dL — ABNORMAL HIGH (ref 70–99)
Potassium: 4.7 mEq/L (ref 3.5–5.3)

## 2013-12-29 LAB — CBC
HCT: 44.8 % (ref 39.0–52.0)
Hemoglobin: 15 g/dL (ref 13.0–17.0)
MCH: 31.7 pg (ref 26.0–34.0)
MCHC: 33.5 g/dL (ref 30.0–36.0)
MCV: 94.7 fL (ref 78.0–100.0)
Platelets: 163 10*3/uL (ref 150–400)
RBC: 4.73 MIL/uL (ref 4.22–5.81)
RDW: 13.5 % (ref 11.5–15.5)
WBC: 6.5 K/uL (ref 4.0–10.5)

## 2013-12-29 LAB — TSH: TSH: 3.628 u[IU]/mL (ref 0.350–4.500)

## 2013-12-30 ENCOUNTER — Telehealth: Payer: Self-pay

## 2013-12-30 DIAGNOSIS — E1121 Type 2 diabetes mellitus with diabetic nephropathy: Secondary | ICD-10-CM | POA: Insufficient documentation

## 2013-12-30 NOTE — Telephone Encounter (Signed)
Patient advised.

## 2013-12-30 NOTE — Telephone Encounter (Signed)
[facility] called and reports the Saxenda injections will cost 1,100 dollars a month after the insurance. Please advise.

## 2013-12-30 NOTE — Telephone Encounter (Signed)
We have plenty of other options for diabetes, have him come in to discuss this with me.

## 2013-12-30 NOTE — Telephone Encounter (Signed)
Patient scheduled.

## 2014-01-03 ENCOUNTER — Ambulatory Visit
Admission: RE | Admit: 2014-01-03 | Discharge: 2014-01-03 | Disposition: A | Discharge status: Home / Self Care | Payer: Medicare Other | Source: Ambulatory Visit | Attending: Sports Medicine | Admitting: Sports Medicine

## 2014-01-03 VITALS — BP 140/70 | HR 100

## 2014-01-03 DIAGNOSIS — M545 Low back pain, unspecified: Secondary | ICD-10-CM | POA: Diagnosis not present

## 2014-01-03 DIAGNOSIS — E669 Obesity, unspecified: Secondary | ICD-10-CM

## 2014-01-03 MED ORDER — METHYLPREDNISOLONE ACETATE 40 MG/ML INJ SUSP (RADIOLOG
120.0000 mg | Freq: Once | INTRAMUSCULAR | Status: AC
  Administered 2014-01-03: 120 mg via EPIDURAL

## 2014-01-03 MED ORDER — IOHEXOL 180 MG/ML  SOLN
1.0000 mL | Freq: Once | INTRAMUSCULAR | Status: AC | PRN
  Administered 2014-01-03: 1 mL via EPIDURAL

## 2014-01-03 NOTE — Discharge Instructions (Signed)

## 2014-01-05 ENCOUNTER — Ambulatory Visit (INDEPENDENT_AMBULATORY_CARE_PROVIDER_SITE_OTHER): Payer: Medicare Other | Admitting: Sports Medicine

## 2014-01-05 ENCOUNTER — Encounter: Payer: Self-pay | Admitting: Sports Medicine

## 2014-01-05 VITALS — BP 145/86 | HR 70 | Ht 67.0 in | Wt 273.0 lb

## 2014-01-05 DIAGNOSIS — E669 Obesity, unspecified: Secondary | ICD-10-CM

## 2014-01-05 MED ORDER — PHENTERMINE HCL 37.5 MG PO CAPS: ORAL_CAPSULE | ORAL | Status: DC

## 2014-01-05 NOTE — Progress Notes (Signed)
  Subjective:    CC: Followup  HPI: Clinton Ortiz is trying to lose weight, we called in [place] injections, this was too expensive, he has a clean cardiac history with a catheterization in the recent past without clinically significant coronary disease. He is amenable to trying phentermine.  Past medical history, Surgical history, Family history not pertinant except as noted below, Social history, Allergies, and medications have been entered into the medical record, reviewed, and no changes needed.   Review of Systems: No fevers, chills, night sweats, weight loss, chest pain, or shortness of breath.   Objective:    General: Well Developed, well nourished, and in no acute distress.  Neuro: Alert and oriented x3, extra-ocular muscles intact, sensation grossly intact.  HEENT: Normocephalic, atraumatic, pupils equal round reactive to light, neck supple, no masses, no lymphadenopathy, thyroid nonpalpable.  Skin: Warm and dry, no rashes. Cardiac: Regular rate and rhythm, no murmurs rubs or gallops, no lower extremity edema.  Respiratory: Clear to auscultation bilaterally. Not using accessory muscles, speaking in full sentences.  Impression and Recommendations:

## 2014-01-05 NOTE — Assessment & Plan Note (Signed)
Starting phentermine. Return monthly for weight checks and refills. He has had a cardiac catheterization that was essentially clear.

## 2014-01-17 ENCOUNTER — Emergency Department (INDEPENDENT_AMBULATORY_CARE_PROVIDER_SITE_OTHER)
Admission: EM | Admit: 2014-01-17 | Discharge: 2014-01-17 | Disposition: A | Discharge status: Home / Self Care | Payer: Medicare Other | Source: Home / Self Care

## 2014-01-17 ENCOUNTER — Encounter: Payer: Self-pay | Admitting: Emergency Medicine

## 2014-01-17 DIAGNOSIS — K5289 Other specified noninfective gastroenteritis and colitis: Secondary | ICD-10-CM

## 2014-01-17 DIAGNOSIS — E860 Dehydration: Secondary | ICD-10-CM

## 2014-01-17 DIAGNOSIS — K529 Noninfective gastroenteritis and colitis, unspecified: Secondary | ICD-10-CM

## 2014-01-17 MED ORDER — SODIUM CHLORIDE 0.9 % IV SOLN
Freq: Once | INTRAVENOUS | Status: AC
  Administered 2014-01-17: 15:00:00 via INTRAVENOUS

## 2014-01-17 NOTE — ED Notes (Signed)
Pt c/o vomiting, diarrhea and body aches x 3 days. Denies fever. He reports some RLQ abd 2 days ago that has resolved.

## 2014-01-17 NOTE — ED Provider Notes (Signed)
CSN: [id number]     Arrival date & time 01/17/14  1418 History   None    Chief Complaint  Patient presents with  . Emesis  . Diarrhea   (Consider location/radiation/quality/duration/timing/severity/associated sxs/prior Treatment) HPI Pt is a 73 yo male who presents to the clinic with vomiting, diarrhea, and body aches for 3 days. His daughter was also sick but she is better now. Denies fever. Has had some chills. No abdominal pain currently but did have RLQ pain 2 days ago that was brief and resolved. No blood or black tarry stools. He had diarrhea from about 1am to 5am this morning. He ate chicken noodle soup and had another 1 hour episode of diarrhea. He has vomited only once. Denies any recent abx or travel. He is starting to feel dizzy.   He did start phentermine 1 and 1/2 weeks ago for weight loss.   Past Medical History  Diagnosis Date  . Hyperlipidemia   . Hypertension   . Hernia   . Spondylolysis   . Frequent urination   . Cancer     left kidney   Past Surgical History  Procedure Laterality Date  . Nephrectomy      left  . Hernia repair     Family History  Problem Relation Age of Onset  . Cancer Mother     bladder  . Heart attack Father    History  Substance Use Topics  . Smoking status: Former Smoker -- 1.00 packs/day for 2 years    Types: Cigarettes  . Smokeless tobacco: Not on file  . Alcohol Use: No    Review of Systems  All other systems reviewed and are negative.   Allergies  Prednisone  Home Medications   Prior to Admission medications   Medication Sig Start Date End Date Taking? Authorizing Provider  amLODipine (NORVASC) 10 MG tablet Take 10 mg by mouth daily.   Yes Historical Provider, MD  aspirin 325 MG tablet Take 325 mg by mouth daily.   Yes Historical Provider, MD  dexamethasone (DECADRON) 4 MG tablet Take 1 tablet (4 mg total) by mouth 2 (two) times daily with a meal. 03/14/13   Silverio Decamp, MD  furosemide (LASIX) 40 MG tablet  Take 40 mg by mouth.    Historical Provider, MD  olmesartan (BENICAR) 40 MG tablet Take 40 mg by mouth daily.    Historical Provider, MD  phentermine 37.5 MG capsule One tab daily at 11 AM 01/05/14   Silverio Decamp, MD  pravastatin (PRAVACHOL) 40 MG tablet Take 40 mg by mouth daily.    Historical Provider, MD  tadalafil (CIALIS) 5 MG tablet Take 1 tablet (5 mg total) by mouth daily as needed (for BPH). 12/28/13   Silverio Decamp, MD  tamsulosin (FLOMAX) 0.4 MG CAPS capsule Take 2 capsules (0.8 mg total) by mouth daily. 12/28/13   Silverio Decamp, MD  traMADol (ULTRAM) 50 MG tablet Take 25 mg by mouth every 6 (six) hours as needed for pain.    Historical Provider, MD  Zolpidem Tartrate 5 MG SUBL Place under the tongue.    Historical Provider, MD   BP 120/83  Pulse 73  Temp(Src) 97.7 F (36.5 C) (Oral)  Resp 18  Ht 5\' 7"  (1.702 m)  Wt 261 lb (118.389 kg)  BMI 40.87 kg/m2  SpO2 96% Physical Exam  Constitutional: He is oriented to person, place, and time. He appears well-developed and well-nourished.  HENT:  Head: Normocephalic and atraumatic.  Right Ear: External ear normal.  Left Ear: External ear normal.  TM's clear.  Mucosa dry and pale.   Eyes: Conjunctivae are normal. Right eye exhibits no discharge. Left eye exhibits no discharge.  Neck: Normal range of motion. Neck supple.  Cardiovascular: Regular rhythm and normal heart sounds.   Tachycardia at 105.   Pulmonary/Chest: Effort normal and breath sounds normal.  Abdominal: Soft. Bowel sounds are normal. He exhibits no distension and no mass. There is no tenderness. There is no rebound and no guarding.  Lymphadenopathy:    He has no cervical adenopathy.  Neurological: He is alert and oriented to person, place, and time.  Skin: Skin is dry. There is pallor.  Very dry mucosa.   Psychiatric: He has a normal mood and affect. His behavior is normal.    ED Course  Procedures (including critical care time) Labs  Review Labs Reviewed  COMPLETE METABOLIC PANEL WITH GFR  POCT CBC W AUTO DIFF ([place])    Imaging Review No results found.   MDM   1. Gastroenteritis   2. Dehydration    1 bag of NS given IV in clinic today. Pt improved after fluids and BP went up to 120's on top.  Reassured pt about likely viral.  Cbc and cmp ordered. Discussed with pt to stop phentermine while getting over illness could be making symptoms worse.  Offered anti-nausea but declined.  Suggested imodium to use as needed.  BRAT diet and stay hydrated.  If diarrhea continues or if symptoms worsen follow up in UC or PCP.     Donella Stade, PA-C 01/17/14 1652

## 2014-01-17 NOTE — Discharge Instructions (Signed)
Stop phentermine until feeling better for at least 3 days. Could be making symptoms worse.  BRAT diet.  Stay hydrated.  immodium for diarrhea.   Viral Gastroenteritis Viral gastroenteritis is also known as stomach flu. This condition affects the stomach and intestinal tract. It can cause sudden diarrhea and vomiting. The illness typically lasts 3 to 8 days. Most people develop an immune response that eventually gets rid of the virus. While this natural response develops, the virus can make you quite ill. CAUSES  Many different viruses can cause gastroenteritis, such as rotavirus or noroviruses. You can catch one of these viruses by consuming contaminated food or water. You may also catch a virus by sharing utensils or other personal items with an infected person or by touching a contaminated surface. SYMPTOMS  The most common symptoms are diarrhea and vomiting. These problems can cause a severe loss of body fluids (dehydration) and a body salt (electrolyte) imbalance. Other symptoms may include:  Fever.  Headache.  Fatigue.  Abdominal pain. DIAGNOSIS  Your caregiver can usually diagnose viral gastroenteritis based on your symptoms and a physical exam. A stool sample may also be taken to test for the presence of viruses or other infections. TREATMENT  This illness typically goes away on its own. Treatments are aimed at rehydration. The most serious cases of viral gastroenteritis involve vomiting so severely that you are not able to keep fluids down. In these cases, fluids must be given through an intravenous line (IV). HOME CARE INSTRUCTIONS   Drink enough fluids to keep your urine clear or pale yellow. Drink small amounts of fluids frequently and increase the amounts as tolerated.  Ask your caregiver for specific rehydration instructions.  Avoid:  Foods high in sugar.  Alcohol.  Carbonated drinks.  Tobacco.  Juice.  Caffeine drinks.  Extremely hot or cold fluids.  Fatty,  greasy foods.  Too much intake of anything at one time.  Dairy products until 24 to 48 hours after diarrhea stops.  You may consume probiotics. Probiotics are active cultures of beneficial bacteria. They may lessen the amount and number of diarrheal stools in adults. Probiotics can be found in yogurt with active cultures and in supplements.  Wash your hands well to avoid spreading the virus.  Only take over-the-counter or prescription medicines for pain, discomfort, or fever as directed by your caregiver. Do not give aspirin to children. Antidiarrheal medicines are not recommended.  Ask your caregiver if you should continue to take your regular prescribed and over-the-counter medicines.  Keep all follow-up appointments as directed by your caregiver. SEEK IMMEDIATE MEDICAL CARE IF:   You are unable to keep fluids down.  You do not urinate at least once every 6 to 8 hours.  You develop shortness of breath.  You notice blood in your stool or vomit. This may look like coffee grounds.  You have abdominal pain that increases or is concentrated in one small area (localized).  You have persistent vomiting or diarrhea.  You have a fever.  The patient is a child younger than 3 months, and he or she has a fever.  The patient is a child older than 3 months, and he or she has a fever and persistent symptoms.  The patient is a child older than 3 months, and he or she has a fever and symptoms suddenly get worse.  The patient is a baby, and he or she has no tears when crying. MAKE SURE YOU:   Understand these instructions.  Will  watch your condition.  Will get help right away if you are not doing well or get worse. Document Released: 04/28/2005 Document Revised: 07/21/2011 Document Reviewed: 02/12/2011 [facility] Patient Information 2015 [place], [place]. This information is not intended to replace advice given to you by your health care provider. Make sure you discuss any questions you  have with your health care provider.

## 2014-01-18 ENCOUNTER — Telehealth: Payer: Self-pay | Admitting: *Deleted

## 2014-01-18 LAB — COMPLETE METABOLIC PANEL WITH GFR
ALBUMIN: 4 g/dL (ref 3.5–5.2)
ALK PHOS: 101 U/L (ref 39–117)
ALT: 32 U/L (ref 0–53)
AST: 21 U/L (ref 0–37)
BILIRUBIN TOTAL: 0.6 mg/dL (ref 0.2–1.2)
BUN: 27 mg/dL — ABNORMAL HIGH (ref 6–23)
CO2: 19 mEq/L (ref 19–32)
Calcium: 9 mg/dL (ref 8.4–10.5)
Chloride: 104 mEq/L (ref 96–112)
Creat: 2.22 mg/dL — ABNORMAL HIGH (ref 0.50–1.35)
GFR, Est African American: 33 mL/min — ABNORMAL LOW
GFR, Est Non African American: 29 mL/min — ABNORMAL LOW
Glucose, Bld: 130 mg/dL — ABNORMAL HIGH (ref 70–99)
POTASSIUM: 4.7 meq/L (ref 3.5–5.3)
SODIUM: 135 meq/L (ref 135–145)
TOTAL PROTEIN: 6.5 g/dL (ref 6.0–8.3)

## 2014-01-18 NOTE — ED Provider Notes (Signed)
Agree with exam, assessment, and plan.   Kandra Nicolas, MD 01/18/14 [phone]

## 2014-01-24 ENCOUNTER — Ambulatory Visit: Payer: Medicare Other | Admitting: Sports Medicine

## 2014-02-02 ENCOUNTER — Encounter: Payer: Self-pay | Admitting: Sports Medicine

## 2014-02-02 ENCOUNTER — Ambulatory Visit (INDEPENDENT_AMBULATORY_CARE_PROVIDER_SITE_OTHER): Payer: Medicare Other | Admitting: Sports Medicine

## 2014-02-02 VITALS — BP 136/87 | HR 85 | Ht 70.0 in | Wt 260.0 lb

## 2014-02-02 DIAGNOSIS — Z23 Encounter for immunization: Secondary | ICD-10-CM | POA: Diagnosis not present

## 2014-02-02 DIAGNOSIS — E1169 Type 2 diabetes mellitus with other specified complication: Secondary | ICD-10-CM

## 2014-02-02 DIAGNOSIS — E119 Type 2 diabetes mellitus without complications: Secondary | ICD-10-CM

## 2014-02-02 DIAGNOSIS — E669 Obesity, unspecified: Secondary | ICD-10-CM

## 2014-02-02 MED ORDER — PHENTERMINE HCL 37.5 MG PO CAPS: ORAL_CAPSULE | ORAL | Status: DC

## 2014-02-02 NOTE — Progress Notes (Signed)
  Subjective:    CC: Followup  HPI: Obesity: 13 pound weight loss on phentermine.  Diabetes mellitus type 2: Due for a foot exam, eye exam.  Renal insufficiency: History of renal cancer post nephrectomy, desires renal function testing.  Past medical history, Surgical history, Family history not pertinant except as noted below, Social history, Allergies, and medications have been entered into the medical record, reviewed, and no changes needed.   Review of Systems: No fevers, chills, night sweats, weight loss, chest pain, or shortness of breath.   Objective:    General: Well Developed, well nourished, and in no acute distress.  Neuro: Alert and oriented x3, extra-ocular muscles intact, sensation grossly intact.  HEENT: Normocephalic, atraumatic, pupils equal round reactive to light, neck supple, no masses, no lymphadenopathy, thyroid nonpalpable.  Skin: Warm and dry, no rashes. Cardiac: Regular rate and rhythm, no murmurs rubs or gallops, no lower extremity edema.  Respiratory: Clear to auscultation bilaterally. Not using accessory muscles, speaking in full sentences.  Diabetic Foot Exam Both feet were examined, there are no signs of ulceration or abnormal callus. Nails are unremarkable. Dorsalis pedis and posterior tibial pulses are palpable. Sensation is intact to sharp and monofilament. Shoes are of appropriate fitment.  Impression and Recommendations:

## 2014-02-02 NOTE — Assessment & Plan Note (Signed)
Diabetic foot exam normal. Referral to ophthalmology.

## 2014-02-02 NOTE — Assessment & Plan Note (Signed)
13 pound weight loss. Refilling phentermine.

## 2014-02-02 NOTE — Addendum Note (Signed)
Addended by: Doree Albee on: 02/02/2014 02:21 PM   Modules accepted: Orders

## 2014-02-03 LAB — BASIC METABOLIC PANEL
BUN: 20 mg/dL (ref 6–23)
Potassium: 5.1 mEq/L (ref 3.5–5.3)
Sodium: 141 mEq/L (ref 135–145)

## 2014-02-03 LAB — BASIC METABOLIC PANEL WITH GFR
CO2: 26 meq/L (ref 19–32)
Calcium: 9.4 mg/dL (ref 8.4–10.5)
Chloride: 106 meq/L (ref 96–112)
Creat: 1.89 mg/dL — ABNORMAL HIGH (ref 0.50–1.35)
Glucose, Bld: 105 mg/dL — ABNORMAL HIGH (ref 70–99)

## 2014-02-07 DIAGNOSIS — N183 Chronic kidney disease, stage 3 unspecified: Secondary | ICD-10-CM | POA: Diagnosis not present

## 2014-02-07 DIAGNOSIS — C649 Malignant neoplasm of unspecified kidney, except renal pelvis: Secondary | ICD-10-CM | POA: Diagnosis not present

## 2014-03-02 ENCOUNTER — Encounter: Payer: Self-pay | Admitting: Sports Medicine

## 2014-03-02 ENCOUNTER — Ambulatory Visit (INDEPENDENT_AMBULATORY_CARE_PROVIDER_SITE_OTHER): Payer: Medicare Other | Admitting: Sports Medicine

## 2014-03-02 VITALS — BP 135/86 | HR 112 | Ht 67.0 in | Wt 254.0 lb

## 2014-03-02 DIAGNOSIS — E669 Obesity, unspecified: Secondary | ICD-10-CM

## 2014-03-02 MED ORDER — PHENTERMINE HCL 37.5 MG PO CAPS: ORAL_CAPSULE | ORAL | Status: DC

## 2014-03-02 NOTE — Assessment & Plan Note (Signed)
Good continued weight loss.  Continue phentermine. Emphasized Portion control. Return in one month for a weight check, and one month after that to reevaluate his diabetes.

## 2014-03-02 NOTE — Progress Notes (Signed)
  Subjective:    CC: Weight check  HPI: Obesity: Continues to lose weight on phentermine. No side effects.  Past medical history, Surgical history, Family history not pertinant except as noted below, Social history, Allergies, and medications have been entered into the medical record, reviewed, and no changes needed.   Review of Systems: No fevers, chills, night sweats, weight loss, chest pain, or shortness of breath.   Objective:    General: Well Developed, well nourished, and in no acute distress.  Neuro: Alert and oriented x3, extra-ocular muscles intact, sensation grossly intact.  HEENT: Normocephalic, atraumatic, pupils equal round reactive to light, neck supple, no masses, no lymphadenopathy, thyroid nonpalpable.  Skin: Warm and dry, no rashes. Cardiac: Regular rate and rhythm, no murmurs rubs or gallops, no lower extremity edema.  Respiratory: Clear to auscultation bilaterally. Not using accessory muscles, speaking in full sentences.  Impression and Recommendations:

## 2014-03-24 ENCOUNTER — Other Ambulatory Visit: Payer: Self-pay

## 2014-03-24 MED ORDER — AMLODIPINE BESYLATE 10 MG PO TABS: 10.0000 mg | ORAL_TABLET | Freq: Every day | ORAL | Status: DC

## 2014-03-24 MED ORDER — PRAVASTATIN SODIUM 40 MG PO TABS: 40.0000 mg | ORAL_TABLET | Freq: Every day | ORAL | Status: DC

## 2014-03-24 NOTE — Telephone Encounter (Signed)
Patient request refill for Amlodipine and Pravastatin #90 3 R has been sent to [facility]. Oneta Rack

## 2014-03-27 ENCOUNTER — Telehealth: Payer: Self-pay

## 2014-03-27 DIAGNOSIS — M542 Cervicalgia: Secondary | ICD-10-CM

## 2014-03-27 MED ORDER — LOSARTAN POTASSIUM 100 MG PO TABS: 100.0000 mg | ORAL_TABLET | Freq: Every day | ORAL | Status: DC

## 2014-03-27 NOTE — Telephone Encounter (Signed)
Patient request refill for Losartan 100 mg #90 3R sent to [facility]. Oneta Rack

## 2014-03-30 ENCOUNTER — Ambulatory Visit (INDEPENDENT_AMBULATORY_CARE_PROVIDER_SITE_OTHER): Payer: Medicare Other | Admitting: Sports Medicine

## 2014-03-30 ENCOUNTER — Encounter: Payer: Self-pay | Admitting: Sports Medicine

## 2014-03-30 VITALS — BP 121/73 | HR 104 | Ht 67.0 in | Wt 248.0 lb

## 2014-03-30 DIAGNOSIS — E669 Obesity, unspecified: Secondary | ICD-10-CM | POA: Diagnosis not present

## 2014-03-30 MED ORDER — PHENTERMINE HCL 37.5 MG PO CAPS: ORAL_CAPSULE | ORAL | Status: DC

## 2014-03-30 NOTE — Assessment & Plan Note (Signed)
Continued excellent weight loss. Weight was as high as 273 and has now dropped to 248. Refill phentermine, return in one month.

## 2014-03-30 NOTE — Progress Notes (Signed)
  Subjective:    CC: Recheck weight  HPI: Obesity: Nahuel has dropped from 273 pounds to 248 pounds, no side effects, closer fitting better and he feels better about himself. Amenable to refill medication.  Past medical history, Surgical history, Family history not pertinant except as noted below, Social history, Allergies, and medications have been entered into the medical record, reviewed, and no changes needed.   Review of Systems: No fevers, chills, night sweats, weight loss, chest pain, or shortness of breath.   Objective:    General: Well Developed, well nourished, and in no acute distress.  Neuro: Alert and oriented x3, extra-ocular muscles intact, sensation grossly intact.  HEENT: Normocephalic, atraumatic, pupils equal round reactive to light, neck supple, no masses, no lymphadenopathy, thyroid nonpalpable.  Skin: Warm and dry, no rashes. Cardiac: Regular rate and rhythm, no murmurs rubs or gallops, no lower extremity edema.  Respiratory: Clear to auscultation bilaterally. Not using accessory muscles, speaking in full sentences.  Impression and Recommendations:

## 2014-05-01 ENCOUNTER — Ambulatory Visit: Payer: Medicare Other | Admitting: Sports Medicine

## 2014-05-01 ENCOUNTER — Ambulatory Visit (INDEPENDENT_AMBULATORY_CARE_PROVIDER_SITE_OTHER): Payer: Medicare Other | Admitting: Sports Medicine

## 2014-05-01 ENCOUNTER — Encounter: Payer: Self-pay | Admitting: Sports Medicine

## 2014-05-01 VITALS — BP 139/76 | HR 108 | Ht 67.0 in | Wt 249.0 lb

## 2014-05-01 DIAGNOSIS — E669 Obesity, unspecified: Secondary | ICD-10-CM

## 2014-05-01 DIAGNOSIS — I10 Essential (primary) hypertension: Secondary | ICD-10-CM

## 2014-05-01 DIAGNOSIS — E1169 Type 2 diabetes mellitus with other specified complication: Secondary | ICD-10-CM

## 2014-05-01 DIAGNOSIS — E119 Type 2 diabetes mellitus without complications: Secondary | ICD-10-CM | POA: Diagnosis not present

## 2014-05-01 NOTE — Assessment & Plan Note (Signed)
Well controlled, no changes 

## 2014-05-01 NOTE — Addendum Note (Signed)
Addended by: Silverio Decamp on: 05/01/2014 04:52 PM   Modules accepted: Level of Service

## 2014-05-01 NOTE — Progress Notes (Signed)
  Subjective:    CC:  Follow-up  HPI: Obesity: Overall having very good weight loss, did fall off the bandwagon recently, his wife has been significantly: He has been in and out of the house to the hospital multiple times.  Hypertension: Well controlled.  Past medical history, Surgical history, Family history not pertinant except as noted below, Social history, Allergies, and medications have been entered into the medical record, reviewed, and no changes needed.   Review of Systems: No fevers, chills, night sweats, weight loss, chest pain, or shortness of breath.   Objective:    General: Well Developed, well nourished, and in no acute distress.  Neuro: Alert and oriented x3, extra-ocular muscles intact, sensation grossly intact.  HEENT: Normocephalic, atraumatic, pupils equal round reactive to light, neck supple, no masses, no lymphadenopathy, thyroid nonpalpable.  Skin: Warm and dry, no rashes. Cardiac: Regular rate and rhythm, no murmurs rubs or gallops, no lower extremity edema.  Respiratory: Clear to auscultation bilaterally. Not using accessory muscles, speaking in full sentences.  Impression and Recommendations:

## 2014-05-01 NOTE — Assessment & Plan Note (Signed)
Continues to do well. He has been off of phentermine, his wife has been in the hospital. He is going to return in one month, and we can restart, we are going to treat until February.

## 2014-05-01 NOTE — Assessment & Plan Note (Signed)
With excellent weight loss and do expect a hemoglobin A1c to come down. We are going to check his blood work.

## 2014-05-02 LAB — COMPREHENSIVE METABOLIC PANEL
ALT: 20 U/L (ref 0–53)
AST: 21 U/L (ref 0–37)
Albumin: 4.2 g/dL (ref 3.5–5.2)
Alkaline Phosphatase: 105 U/L (ref 39–117)
CO2: 28 mEq/L (ref 19–32)
Glucose, Bld: 114 mg/dL — ABNORMAL HIGH (ref 70–99)
Potassium: 4.6 mEq/L (ref 3.5–5.3)
Sodium: 142 mEq/L (ref 135–145)
Total Bilirubin: 0.5 mg/dL (ref 0.2–1.2)
Total Protein: 6.2 g/dL (ref 6.0–8.3)

## 2014-05-02 LAB — COMPREHENSIVE METABOLIC PANEL WITH GFR
BUN: 28 mg/dL — ABNORMAL HIGH (ref 6–23)
Calcium: 9.5 mg/dL (ref 8.4–10.5)
Chloride: 107 meq/L (ref 96–112)
Creat: 1.62 mg/dL — ABNORMAL HIGH (ref 0.50–1.35)

## 2014-05-02 LAB — HEMOGLOBIN A1C
Hgb A1c MFr Bld: 6.7 % — ABNORMAL HIGH (ref <5.7)
Mean Plasma Glucose: 146 mg/dL — ABNORMAL HIGH (ref <117)

## 2014-05-04 ENCOUNTER — Other Ambulatory Visit: Payer: Self-pay | Admitting: Sports Medicine

## 2014-05-12 ENCOUNTER — Emergency Department (INDEPENDENT_AMBULATORY_CARE_PROVIDER_SITE_OTHER)
Admission: EM | Admit: 2014-05-12 | Discharge: 2014-05-12 | Disposition: A | Discharge status: Home / Self Care | Payer: Medicare Other | Source: Home / Self Care | Attending: Family Medicine | Admitting: Family Medicine

## 2014-05-12 ENCOUNTER — Encounter: Payer: Self-pay | Admitting: Emergency Medicine

## 2014-05-12 DIAGNOSIS — B9789 Other viral agents as the cause of diseases classified elsewhere: Principal | ICD-10-CM

## 2014-05-12 DIAGNOSIS — J069 Acute upper respiratory infection, unspecified: Secondary | ICD-10-CM

## 2014-05-12 MED ORDER — CEFDINIR 300 MG PO CAPS: 300.0000 mg | ORAL_CAPSULE | Freq: Two times a day (BID) | ORAL | Status: DC

## 2014-05-12 MED ORDER — BENZONATATE 200 MG PO CAPS: 200.0000 mg | ORAL_CAPSULE | Freq: Every day | ORAL | Status: DC

## 2014-05-12 NOTE — ED Provider Notes (Signed)
CSN: [id number]     Arrival date & time 05/12/14  1246 History   First MD Initiated Contact with Patient 05/12/14 1328     Chief Complaint  Patient presents with  . Nasal Congestion  . Cough      HPI Comments: Patient complains of one week history of typical cold-like symptoms including mild sore throat, sinus congestion, headache, fatigue, and non-productive cough.  Over the past two days he has had sweats.   He is a past smoker with a history of pneumonia.  The history is provided by the patient.    Past Medical History  Diagnosis Date  . Hyperlipidemia   . Hypertension   . Hernia   . Spondylolysis   . Frequent urination   . Cancer     left kidney   Past Surgical History  Procedure Laterality Date  . Nephrectomy      left  . Hernia repair     Family History  Problem Relation Age of Onset  . Cancer Mother     bladder  . Heart attack Father    History  Substance Use Topics  . Smoking status: Former Smoker -- 1.00 packs/day for 2 years    Types: Cigarettes  . Smokeless tobacco: Not on file  . Alcohol Use: No    Review of Systems + sore throat + cough No pleuritic pain No wheezing + nasal congestion + post-nasal drainage No sinus pain/pressure No itchy/red eyes No earache No hemoptysis No SOB No fever, + chills/sweats No nausea No vomiting No abdominal pain No diarrhea No urinary symptoms No skin rash + fatigue + myalgias + headache Used OTC meds without relief  Allergies  Prednisone  Home Medications   Prior to Admission medications   Medication Sig Start Date End Date Taking? Authorizing Provider  aspirin 325 MG tablet Take 325 mg by mouth daily.    Historical Provider, MD  benzonatate (TESSALON) 200 MG capsule Take 1 capsule (200 mg total) by mouth at bedtime. Take as needed for cough 05/12/14   Kandra Nicolas, MD  cefdinir (OMNICEF) 300 MG capsule Take 1 capsule (300 mg total) by mouth 2 (two) times daily. 05/12/14   Kandra Nicolas, MD   furosemide (LASIX) 40 MG tablet Take 40 mg by mouth as needed.    Historical Provider, MD  losartan (COZAAR) 100 MG tablet Take 1 tablet (100 mg total) by mouth daily. 03/27/14   Silverio Decamp, MD  phentermine 37.5 MG capsule One tab daily at 11 AM 03/30/14   Silverio Decamp, MD  pravastatin (PRAVACHOL) 40 MG tablet Take 1 tablet (40 mg total) by mouth daily. 03/24/14   Silverio Decamp, MD  tamsulosin (FLOMAX) 0.4 MG CAPS capsule TAKE 2 CAPSULES (0.8 MG TOTAL) BY MOUTH DAILY. 05/04/14   Silverio Decamp, MD  traMADol (ULTRAM) 50 MG tablet Take 25 mg by mouth every 6 (six) hours as needed for pain.    Historical Provider, MD   BP 117/76 mmHg  Pulse 75  Temp(Src) 97.7 F (36.5 C) (Oral)  Resp 16  Ht 5\' 7"  (1.702 m)  Wt 250 lb (113.399 kg)  BMI 39.15 kg/m2  SpO2 96% Physical Exam Nursing notes and Vital Signs reviewed. Appearance:  Patient appears stated age, and in no acute distress.  Patient is obese (BMI 39.2) Eyes:  Pupils are equal, round, and reactive to light and accomodation.  Extraocular movement is intact.  Conjunctivae are not inflamed  Ears:  Canals normal.  Tympanic membranes normal.  Nose:  Mildly congested turbinates.  No sinus tenderness.   Pharynx:  Normal Neck:  Supple.   Tender enlarged posterior nodes are palpated bilaterally  Lungs:  Clear to auscultation.  Breath sounds are equal.  Heart:  Regular rate and rhythm without murmurs, rubs, or gallops.  Abdomen:  Nontender without masses or hepatosplenomegaly.  Bowel sounds are present.  No CVA or flank tenderness.  Extremities:  No edema.  No calf tenderness Skin:  No rash present.   ED Course  Procedures  none   MDM   1. Viral URI with cough    With a past history of pneumonia will begin empiric Omnicef.  Prescription written for Benzonatate [facility]) to take at bedtime for night-time cough.  Take plain Mucinex (1200 mg guaifenesin) twice daily for cough and congestion.  Increase fluid  intake, rest. May use Afrin nasal spray (or generic oxymetazoline) twice daily for about 5 days.  Also recommend using saline nasal spray several times daily and saline nasal irrigation (AYR is a common brand) Try warm salt water gargles for sore throat.  Stop all antihistamines for now, and other non-prescription cough/cold preparations. Follow-up with family doctor if not improving about10 days.    Kandra Nicolas, MD 05/14/14 0700

## 2014-05-12 NOTE — ED Notes (Signed)
Reports congestion over past week; concerned it will progress to pneumonia.

## 2014-05-12 NOTE — Discharge Instructions (Signed)
Take plain Mucinex (1200 mg guaifenesin) twice daily for cough and congestion.  Increase fluid intake, rest. May use Afrin nasal spray (or generic oxymetazoline) twice daily for about 5 days.  Also recommend using saline nasal spray several times daily and saline nasal irrigation (AYR is a common brand) Try warm salt water gargles for sore throat.  Stop all antihistamines for now, and other non-prescription cough/cold preparations. Follow-up with family doctor if not improving about10 days.

## 2014-05-26 ENCOUNTER — Ambulatory Visit (INDEPENDENT_AMBULATORY_CARE_PROVIDER_SITE_OTHER): Payer: Medicare Other

## 2014-05-26 ENCOUNTER — Encounter: Payer: Self-pay | Admitting: Physician Assistant

## 2014-05-26 ENCOUNTER — Ambulatory Visit (INDEPENDENT_AMBULATORY_CARE_PROVIDER_SITE_OTHER): Payer: Medicare Other | Admitting: Physician Assistant

## 2014-05-26 VITALS — BP 133/72 | HR 88 | Temp 97.6°F | Ht 67.0 in | Wt 248.0 lb

## 2014-05-26 DIAGNOSIS — J449 Chronic obstructive pulmonary disease, unspecified: Secondary | ICD-10-CM | POA: Diagnosis not present

## 2014-05-26 DIAGNOSIS — J209 Acute bronchitis, unspecified: Secondary | ICD-10-CM | POA: Diagnosis not present

## 2014-05-26 DIAGNOSIS — R05 Cough: Secondary | ICD-10-CM

## 2014-05-26 DIAGNOSIS — R059 Cough, unspecified: Secondary | ICD-10-CM

## 2014-05-26 MED ORDER — HYDROCODONE-HOMATROPINE 5-1.5 MG/5ML PO SYRP: 5.0000 mL | ORAL_SOLUTION | Freq: Every evening | ORAL | Status: DC | PRN

## 2014-05-26 MED ORDER — ALBUTEROL SULFATE 108 (90 BASE) MCG/ACT IN AEPB: 2.0000 | INHALATION_SPRAY | RESPIRATORY_TRACT | Status: DC | PRN

## 2014-05-26 MED ORDER — AZITHROMYCIN 250 MG PO TABS
ORAL_TABLET | ORAL | Status: DC
Start: 2014-05-26 — End: 2014-06-01

## 2014-05-26 NOTE — Progress Notes (Signed)
   Subjective:    Patient ID: Clinton Ortiz, male    DOB: 04-17-1941, 74 y.o.   MRN: [id number]  HPI Pt presents to the clinic with continual productive cough since the first of the year. Went to [facility] 05/12/14 and treated with tessalon pearles, omnicef and mucinex. Does feel some better but cannot get rid of cough. Chest still feels tight and complains of some wheezing at bedtime. Producing blackish/brown sputum. No fever or chills. Feels very weak.    Review of Systems  All other systems reviewed and are negative.      Objective:   Physical Exam  Constitutional: He is oriented to person, place, and time. He appears well-developed and well-nourished.  HENT:  Head: Normocephalic and atraumatic.  Right Ear: External ear normal.  Left Ear: External ear normal.  Nose: Nose normal.  Mouth/Throat: Oropharynx is clear and moist.  Eyes: Conjunctivae are normal. Right eye exhibits no discharge. Left eye exhibits no discharge.  Neck: Normal range of motion. Neck supple.  Cardiovascular: Normal rate, regular rhythm and normal heart sounds.   Pulmonary/Chest: Effort normal and breath sounds normal. He has no wheezes.  Productive cough on exam.  PUlse ox 96 percent.   Lymphadenopathy:    He has no cervical adenopathy.  Neurological: He is alert and oriented to person, place, and time.  Skin: Skin is dry.  Psychiatric: He has a normal mood and affect. His behavior is normal.          Assessment & Plan:  Acute bronchitis/cough- cough for over 15 days will get CXR. zpak given for next 5 days. Added albuterol inhaler to use for next 3-5 days as needed 2 puffs every 4-6 hours for cough and chest tightness. Hycodan given for cough at bedtime. Pt not tolerant to prednisone. Follow up if not improving or worsening.

## 2014-05-26 NOTE — Patient Instructions (Signed)
Hycodan for cough at bedtime.  Start zpak.  Get chest xray.  Albuterol inhaler as needed 2 puffs.

## 2014-06-01 ENCOUNTER — Encounter: Payer: Self-pay | Admitting: Sports Medicine

## 2014-06-01 ENCOUNTER — Ambulatory Visit (INDEPENDENT_AMBULATORY_CARE_PROVIDER_SITE_OTHER): Payer: Medicare Other | Admitting: Sports Medicine

## 2014-06-01 VITALS — BP 119/78 | HR 85 | Wt 250.0 lb

## 2014-06-01 DIAGNOSIS — J441 Chronic obstructive pulmonary disease with (acute) exacerbation: Secondary | ICD-10-CM | POA: Diagnosis not present

## 2014-06-01 DIAGNOSIS — J449 Chronic obstructive pulmonary disease, unspecified: Secondary | ICD-10-CM | POA: Insufficient documentation

## 2014-06-01 MED ORDER — DOXYCYCLINE HYCLATE 100 MG PO TABS: 100.0000 mg | ORAL_TABLET | Freq: Two times a day (BID) | ORAL | Status: AC

## 2014-06-01 MED ORDER — PREDNISONE 50 MG PO TABS: 50.0000 mg | ORAL_TABLET | Freq: Every day | ORAL | Status: DC

## 2014-06-01 MED ORDER — BENZONATATE 200 MG PO CAPS: 200.0000 mg | ORAL_CAPSULE | Freq: Three times a day (TID) | ORAL | Status: DC | PRN

## 2014-06-01 NOTE — Progress Notes (Signed)
  Subjective:    CC: Weight check  HPI: Clinton Ortiz's weight loss has plateaued however he is coming up on 6 months of treatment and we were going to discontinue treatment next month anyway. He is happy with his weight loss and his hemoglobin A1c has improved significantly.  Unfortunately he is continuing to have a cough that is moderate, persistent and mildly productive despite azithromycin and antitussives. Chest x-ray did show some hyperinflation suggestive of COPD.  Past medical history, Surgical history, Family history not pertinant except as noted below, Social history, Allergies, and medications have been entered into the medical record, reviewed, and no changes needed.   Review of Systems: No fevers, chills, night sweats, weight loss, chest pain, or shortness of breath.   Objective:    General: Well Developed, well nourished, and in no acute distress.  Neuro: Alert and oriented x3, extra-ocular muscles intact, sensation grossly intact.  HEENT: Normocephalic, atraumatic, pupils equal round reactive to light, neck supple, no masses, no lymphadenopathy, thyroid nonpalpable.  Skin: Warm and dry, no rashes. Cardiac: Regular rate and rhythm, no murmurs rubs or gallops, no lower extremity edema.  Respiratory: Coarse sounds bilaterally with really no wheezes, crackles, rales. Not using accessory muscles, speaking in full sentences.  Impression and Recommendations:

## 2014-06-01 NOTE — Assessment & Plan Note (Signed)
Typically  asymptomaticchest x-ray does show some hyperinflation suggestive of early emphysema.  doxycycline, prednisone, Tessalon Perles.  Return if no better in 2 weeks.

## 2014-06-15 ENCOUNTER — Ambulatory Visit (INDEPENDENT_AMBULATORY_CARE_PROVIDER_SITE_OTHER): Payer: Medicare Other | Admitting: Sports Medicine

## 2014-06-15 ENCOUNTER — Encounter: Payer: Self-pay | Admitting: Sports Medicine

## 2014-06-15 VITALS — BP 115/78 | HR 82 | Wt 252.0 lb

## 2014-06-15 DIAGNOSIS — J441 Chronic obstructive pulmonary disease with (acute) exacerbation: Secondary | ICD-10-CM

## 2014-06-15 NOTE — Assessment & Plan Note (Signed)
Resolved with antibiotics and steroids.

## 2014-06-15 NOTE — Progress Notes (Signed)
  Subjective:    CC:  Follow-up  HPI:  I saw Clinton Ortiz about a week and a half to 2 weeks ago for a COPD exacerbation, he was treated with steroids, antibiotics, and returns today with symptoms completely resolved.  Past medical history, Surgical history, Family history not pertinant except as noted below, Social history, Allergies, and medications have been entered into the medical record, reviewed, and no changes needed.   Review of Systems: No fevers, chills, night sweats, weight loss, chest pain, or shortness of breath.   Objective:    General: Well Developed, well nourished, and in no acute distress.  Neuro: Alert and oriented x3, extra-ocular muscles intact, sensation grossly intact.  HEENT: Normocephalic, atraumatic, pupils equal round reactive to light, neck supple, no masses, no lymphadenopathy, thyroid nonpalpable.  Skin: Warm and dry, no rashes. Cardiac: Regular rate and rhythm, no murmurs rubs or gallops, no lower extremity edema.  Respiratory: Clear to auscultation bilaterally. Not using accessory muscles, speaking in full sentences.  Impression and Recommendations:

## 2014-06-20 DIAGNOSIS — Z85810 Personal history of malignant neoplasm of tongue: Secondary | ICD-10-CM | POA: Diagnosis not present

## 2014-09-05 ENCOUNTER — Encounter: Payer: PRIVATE HEALTH INSURANCE | Admitting: Sports Medicine

## 2014-09-13 ENCOUNTER — Encounter: Payer: Self-pay | Admitting: Sports Medicine

## 2014-09-13 ENCOUNTER — Ambulatory Visit (INDEPENDENT_AMBULATORY_CARE_PROVIDER_SITE_OTHER): Payer: Medicare Other | Admitting: Sports Medicine

## 2014-09-13 ENCOUNTER — Ambulatory Visit (INDEPENDENT_AMBULATORY_CARE_PROVIDER_SITE_OTHER): Payer: Medicare Other

## 2014-09-13 VITALS — BP 148/87 | HR 82 | Temp 97.5°F | Wt 269.0 lb

## 2014-09-13 DIAGNOSIS — R221 Localized swelling, mass and lump, neck: Secondary | ICD-10-CM

## 2014-09-13 DIAGNOSIS — E1169 Type 2 diabetes mellitus with other specified complication: Secondary | ICD-10-CM

## 2014-09-13 DIAGNOSIS — Z0000 Encounter for general adult medical examination without abnormal findings: Secondary | ICD-10-CM

## 2014-09-13 DIAGNOSIS — E119 Type 2 diabetes mellitus without complications: Secondary | ICD-10-CM

## 2014-09-13 DIAGNOSIS — E785 Hyperlipidemia, unspecified: Secondary | ICD-10-CM | POA: Diagnosis not present

## 2014-09-13 DIAGNOSIS — I10 Essential (primary) hypertension: Secondary | ICD-10-CM

## 2014-09-13 DIAGNOSIS — Z1211 Encounter for screening for malignant neoplasm of colon: Secondary | ICD-10-CM

## 2014-09-13 DIAGNOSIS — E049 Nontoxic goiter, unspecified: Secondary | ICD-10-CM | POA: Diagnosis not present

## 2014-09-13 DIAGNOSIS — E669 Obesity, unspecified: Secondary | ICD-10-CM

## 2014-09-13 DIAGNOSIS — N400 Enlarged prostate without lower urinary tract symptoms: Secondary | ICD-10-CM

## 2014-09-13 DIAGNOSIS — Z23 Encounter for immunization: Secondary | ICD-10-CM | POA: Diagnosis not present

## 2014-09-13 DIAGNOSIS — J410 Simple chronic bronchitis: Secondary | ICD-10-CM

## 2014-09-13 LAB — HEMOCCULT GUIAC POC 1CARD (OFFICE): Fecal Occult Blood, POC: NEGATIVE

## 2014-09-13 NOTE — Assessment & Plan Note (Signed)
Stable, no changes needed.

## 2014-09-13 NOTE — Assessment & Plan Note (Addendum)
Ultrasound one year ago at [facility] showed some cystic changes in the thyroid. Rechecking to ensure no change.  CT scan is recommended after ultrasound, there is a concern for malignancy. Patient needs to get CT scan scheduled after he gets his fasting blood work done, since they will need a renal function.

## 2014-09-13 NOTE — Progress Notes (Signed)
Subjective:    Clinton Ortiz is a 74 y.o. male who presents for Medicare Annual/Subsequent preventive examination.   Preventive Screening-Counseling & Management  Tobacco History  Smoking status  . Former Smoker -- 1.00 packs/day for 2 years  . Types: Cigarettes  Smokeless tobacco  . Not on file    Problems Prior to Visit 1. Neck mass: Right-sided, last year he did have an ultrasound at [facility], that showed some cystic changes in the thyroid. Amenable to do a recheck.  Current Problems (verified) Patient Active Problem List   Diagnosis Date Noted  . COPD exacerbation 06/01/2014  . Diabetes mellitus type 2 in obese 12/30/2013  . BPH (benign prostatic hypertrophy) 12/28/2013  . Obesity 12/28/2013  . Annual physical exam 02/14/2013  . Hypertension 09/02/2012  . Hyperlipidemia 09/02/2012  . History of renal cancer status post nephrectomy 09/02/2012  . Swelling, scrotum 09/02/2012  . Low back pain, lumbar spondylosis, ? DISH syndrome 12/29/2011    Medications Prior to Visit Current Outpatient Prescriptions on File Prior to Visit  Medication Sig Dispense Refill  . Albuterol Sulfate (PROAIR RESPICLICK) 123XX123 (90 BASE) MCG/ACT AEPB Inhale 2 puffs into the lungs every 4 (four) hours as needed. 1 each 0  . aspirin 325 MG tablet Take 325 mg by mouth daily.    . benzonatate (TESSALON) 200 MG capsule Take 1 capsule (200 mg total) by mouth 3 (three) times daily as needed for cough. 45 capsule 0  . furosemide (LASIX) 40 MG tablet Take 40 mg by mouth as needed.    Marland Kitchen losartan (COZAAR) 100 MG tablet Take 1 tablet (100 mg total) by mouth daily. 90 tablet 3  . phentermine 37.5 MG capsule One tab daily at 11 AM 30 capsule 0  . pravastatin (PRAVACHOL) 40 MG tablet Take 1 tablet (40 mg total) by mouth daily. 90 tablet 3  . predniSONE (DELTASONE) 50 MG tablet Take 1 tablet (50 mg total) by mouth daily. 5 tablet 0  . tamsulosin (FLOMAX) 0.4 MG CAPS capsule TAKE 2 CAPSULES (0.8 MG TOTAL)  BY MOUTH DAILY. 60 capsule 3  . traMADol (ULTRAM) 50 MG tablet Take 25 mg by mouth every 6 (six) hours as needed for pain.     No current facility-administered medications on file prior to visit.    Current Medications (verified) Current Outpatient Prescriptions  Medication Sig Dispense Refill  . Albuterol Sulfate (PROAIR RESPICLICK) 123XX123 (90 BASE) MCG/ACT AEPB Inhale 2 puffs into the lungs every 4 (four) hours as needed. 1 each 0  . aspirin 325 MG tablet Take 325 mg by mouth daily.    . benzonatate (TESSALON) 200 MG capsule Take 1 capsule (200 mg total) by mouth 3 (three) times daily as needed for cough. 45 capsule 0  . furosemide (LASIX) 40 MG tablet Take 40 mg by mouth as needed.    Marland Kitchen losartan (COZAAR) 100 MG tablet Take 1 tablet (100 mg total) by mouth daily. 90 tablet 3  . phentermine 37.5 MG capsule One tab daily at 11 AM 30 capsule 0  . pravastatin (PRAVACHOL) 40 MG tablet Take 1 tablet (40 mg total) by mouth daily. 90 tablet 3  . predniSONE (DELTASONE) 50 MG tablet Take 1 tablet (50 mg total) by mouth daily. 5 tablet 0  . tamsulosin (FLOMAX) 0.4 MG CAPS capsule TAKE 2 CAPSULES (0.8 MG TOTAL) BY MOUTH DAILY. 60 capsule 3  . traMADol (ULTRAM) 50 MG tablet Take 25 mg by mouth every 6 (six) hours as needed for  pain.     No current facility-administered medications for this visit.     Allergies (verified) Review of patient's allergies indicates no known allergies.   PAST HISTORY  Family History Family History  Problem Relation Age of Onset  . Cancer Mother     bladder  . Heart attack Father     Social History History  Substance Use Topics  . Smoking status: Former Smoker -- 1.00 packs/day for 2 years    Types: Cigarettes  . Smokeless tobacco: Not on file  . Alcohol Use: No    Are there smokers in your home (other than you)?  No  Risk Factors Current exercise habits: The patient does not participate in regular exercise at present.  Dietary issues discussed: Yes    Cardiac risk factors: advanced age (older than 27 for men, 61 for women), diabetes mellitus, dyslipidemia, hypertension and male gender.  Depression Screen (Note: if answer to either of the following is "Yes", a more complete depression screening is indicated)   Q1: Over the past two weeks, have you felt down, depressed or hopeless? No  Q2: Over the past two weeks, have you felt little interest or pleasure in doing things? No  Have you lost interest or pleasure in daily life? No  Do you often feel hopeless? No  Do you cry easily over simple problems? No  Activities of Daily Living In your present state of health, do you have any difficulty performing the following activities?:  Driving? Yes Managing money?  No Feeding yourself? No Getting from bed to chair? No Climbing a flight of stairs? No Preparing food and eating?: No Bathing or showering? No Getting dressed: No Getting to the toilet? No Using the toilet:No Moving around from place to place: No In the past year have you fallen or had a near fall?:No   Are you sexually active?  No  Do you have more than one partner?  No  Hearing Difficulties: Yes Do you often ask people to speak up or repeat themselves? Yes Do you experience ringing or noises in your ears? No Do you have difficulty understanding soft or whispered voices? No   Do you feel that you have a problem with memory? No  Do you often misplace items? No  Do you feel safe at home?  Yes  Cognitive Testing  Alert? Yes  Normal Appearance?Yes  Oriented to person? Yes  Place? Yes   Time? Yes  Recall of three objects?  Yes  Can perform simple calculations? Yes  Displays appropriate judgment?Yes  Can read the correct time from a watch face?Yes   Advanced Directives have been discussed with the patient? No   List the Names of Other Physician/Practitioners you currently use: 1.    Indicate any recent Medical Services you may have received from other than [facility]  providers in the past year (date may be approximate).  Immunization History  Administered Date(s) Administered  . Influenza,inj,Quad PF,36+ Mos 02/14/2013, 02/02/2014  . Tdap 02/14/2013    Screening Tests Health Maintenance  Topic Date Due  . ZOSTAVAX  07/28/2001  . PNA vac Low Risk Adult (1 of 2 - PCV13) 07/29/2006  . HEMOGLOBIN A1C  10/31/2014  . INFLUENZA VACCINE  12/11/2014  . FOOT EXAM  02/03/2015  . OPHTHALMOLOGY EXAM  02/15/2015  . COLONOSCOPY  05/12/2017  . TETANUS/TDAP  02/15/2023    All answers were reviewed with the patient and necessary referrals were made:  Aundria Mems, MD   09/13/2014  History reviewed: allergies, current medications, past family history, past medical history, past social history, past surgical history and problem list  Review of Systems A comprehensive review of systems was negative.    Objective:     Vision by Snellen chart: right eye:20/20, left eye:20/40 Blood pressure 148/87, pulse 82, temperature 97.5 F (36.4 C), temperature source Oral, weight 269 lb (122.018 kg), SpO2 93 %. Body mass index is 42.12 kg/(m^2). General: Well Developed, well nourished, and in no acute distress.  Neuro: Alert and oriented x3, extra-ocular muscles intact, sensation grossly intact. Cranial nerves II through XII are intact, motor, sensory, and coordinative functions are all intact. HEENT: Normocephalic, atraumatic, pupils equal round reactive to light, neck supple, no masses, no lymphadenopathy, thyroid minimally full particularly on the right side, no palpable nodules. Oropharynx, nasopharynx, external ear canals are unremarkable. Skin: Warm and dry, no rashes noted.  Cardiac: Regular rate and rhythm, no murmurs rubs or gallops.  Respiratory: Clear to auscultation bilaterally. Not using accessory muscles, speaking in full sentences.  Abdominal: Soft, nontender, nondistended, positive bowel sounds, no masses, no organomegaly.  Musculoskeletal:  Shoulder, elbow, wrist, hip, knee, ankle stable, and with full range of motion. Rectal: Good tone, normal prostate, Hemoccult negative.     Assessment:     Healthy male     Plan:     During the course of the visit the patient was educated and counseled about appropriate screening and preventive services including:    Pneumococcal vaccine   Diet review for nutrition referral? Yes ____  Not Indicated _x___   Patient Instructions (the written plan) was given to the patient.  Medicare Attestation I have personally reviewed: The patient's medical and social history Their use of alcohol, tobacco or illicit drugs Their current medications and supplements The patient's functional ability including ADLs,fall risks, home safety risks, cognitive, and hearing and visual impairment Diet and physical activities Evidence for depression or mood disorders  The patient's weight, height, BMI, and visual acuity have been recorded in the chart.  I have made referrals, counseling, and provided education to the patient based on review of the above and I have provided the patient with a written personalized care plan for preventive services.     Aundria Mems, MD   09/13/2014

## 2014-09-13 NOTE — Assessment & Plan Note (Signed)
Unremarkable prostate exam today.

## 2014-09-13 NOTE — Assessment & Plan Note (Signed)
Medicare physical as above. Pneumovax.

## 2014-09-13 NOTE — Assessment & Plan Note (Signed)
Rechecking lipids. 

## 2014-09-13 NOTE — Assessment & Plan Note (Signed)
Moderately elevated, no changes today.

## 2014-09-13 NOTE — Assessment & Plan Note (Signed)
Rechecking A1c. 

## 2014-09-13 NOTE — Addendum Note (Signed)
Addended by: Silverio Decamp on: 09/13/2014 01:40 PM   Modules accepted: Orders

## 2014-09-15 ENCOUNTER — Emergency Department (INDEPENDENT_AMBULATORY_CARE_PROVIDER_SITE_OTHER)
Admission: EM | Admit: 2014-09-15 | Discharge: 2014-09-15 | Disposition: A | Discharge status: Home / Self Care | Payer: Medicare Other | Source: Home / Self Care

## 2014-09-15 ENCOUNTER — Encounter: Payer: Self-pay | Admitting: Emergency Medicine

## 2014-09-15 DIAGNOSIS — R358 Other polyuria: Secondary | ICD-10-CM

## 2014-09-15 DIAGNOSIS — S39011A Strain of muscle, fascia and tendon of abdomen, initial encounter: Secondary | ICD-10-CM | POA: Diagnosis not present

## 2014-09-15 DIAGNOSIS — R3589 Other polyuria: Secondary | ICD-10-CM

## 2014-09-15 LAB — POCT URINALYSIS DIP (MANUAL ENTRY)
Bilirubin, UA: NEGATIVE
Blood, UA: NEGATIVE
Glucose, UA: NEGATIVE
Ketones, POC UA: NEGATIVE
Leukocytes, UA: NEGATIVE
Nitrite, UA: NEGATIVE
Protein Ur, POC: NEGATIVE
Spec Grav, UA: 1.02 (ref 1.005–1.03)
Urobilinogen, UA: 0.2 (ref 0–1)
pH, UA: 6 (ref 5–8)

## 2014-09-15 MED ORDER — TRAMADOL HCL 50 MG PO TABS: 50.0000 mg | ORAL_TABLET | Freq: Four times a day (QID) | ORAL | Status: DC | PRN

## 2014-09-15 NOTE — ED Provider Notes (Signed)
CSN: [id number]     Arrival date & time 09/15/14  1242  [place] [facility] Friday History   None    Chief Complaint  Patient presents with  . Abdominal Pain    The history is provided by the patient and the spouse.   Mild R,L,mid Lower abdominal pain described as "muscular soreness" intermittently for 3-4 weeks after lifting heavy items. ? Associated with mild urinary frequency, saw Dr T on Wed on this week but it wasn't bothering him then. Urinary stream has been normal for him without any acute change. Denies any other GU symptoms. No dysuria or hematuria or incontinence. No nausea or vomiting or change in bowel habits or fever or chills. No cardio respiratory symptoms. Past Medical History  Diagnosis Date  . Hyperlipidemia   . Hypertension   . Hernia   . Spondylolysis   . Frequent urination   . Cancer     left kidney   Past Surgical History  Procedure Laterality Date  . Nephrectomy      left  . Hernia repair     Family History  Problem Relation Age of Onset  . Cancer Mother     bladder  . Heart attack Father    History  Substance Use Topics  . Smoking status: Former Smoker -- 1.00 packs/day for 2 years    Types: Cigarettes  . Smokeless tobacco: Not on file  . Alcohol Use: No    Review of Systems  All other systems reviewed and are negative.   Allergies  Review of patient's allergies indicates no active allergies.  Home Medications   Prior to Admission medications   Medication Sig Start Date End Date Taking? Authorizing Provider  Albuterol Sulfate (PROAIR RESPICLICK) [id number] (90 BASE) MCG/ACT AEPB Inhale 2 puffs into the lungs every 4 (four) hours as needed. 05/26/14   Jade L Breeback, PA-C  aspirin 325 MG tablet Take 325 mg by mouth daily.    Historical Provider, MD  furosemide (LASIX) 40 MG tablet Take 40 mg by mouth as needed.    Historical Provider, MD  losartan (COZAAR) 100 MG tablet Take 1 tablet (100 mg total) by mouth daily. 03/27/14   Silverio Decamp, MD  phentermine 37.5 MG capsule One tab daily at 11 AM 03/30/14   Silverio Decamp, MD  pravastatin (PRAVACHOL) 40 MG tablet Take 1 tablet (40 mg total) by mouth daily. 03/24/14   Silverio Decamp, MD  tamsulosin (FLOMAX) 0.4 MG CAPS capsule TAKE 2 CAPSULES (0.8 MG TOTAL) BY MOUTH DAILY. 09/17/14   Silverio Decamp, MD  traMADol (ULTRAM) 50 MG tablet Take 1 tablet (50 mg total) by mouth every 6 (six) hours as needed for severe pain. Any further refills would need to be decided by Dr. Dianah Field. 09/15/14   Jacqulyn Cane, MD   BP 149/84 mmHg  Pulse 74  Temp(Src) 97.5 F (36.4 C) (Oral)  Ht 5\' 7"  (1.702 m)  Wt 271 lb (122.925 kg)  BMI 42.43 kg/m2  SpO2 96% Physical Exam  Constitutional: He is oriented to person, place, and time. He appears well-developed and well-nourished. No distress.  HENT:  Head: Normocephalic and atraumatic.  Mouth/Throat: Oropharynx is clear and moist.  Eyes: Conjunctivae and EOM are normal. Pupils are equal, round, and reactive to light. No scleral icterus.  Neck: Normal range of motion. No JVD present. No tracheal deviation present.  Cardiovascular: Normal rate and normal heart sounds.   Pulmonary/Chest: Effort normal and breath sounds normal.  Abdominal: Soft. Bowel sounds are normal. He exhibits no distension and no mass. There is no hepatosplenomegaly. There is no rigidity, no rebound, no guarding, no CVA tenderness, no tenderness at McBurney's point and negative Murphy's sign. Hernia confirmed negative in the ventral area.  The only abdominal tenderness is soft tissue and muscle tenderness over right and left lower abdominal wall, exacerbated by movement. Abdomen is obese, but otherwise abdominal exam negative  Musculoskeletal: Normal range of motion.  Neurological: He is alert and oriented to person, place, and time.  Skin: Skin is warm.  Psychiatric: He has a normal mood and affect.  Nursing note and vitals reviewed.   he declined  GU exam today ED Course  Procedures (including critical care time) Labs Review Labs Reviewed  POCT URINALYSIS DIP (MANUAL ENTRY)   Results for orders placed or performed during the hospital encounter of 09/15/14  POCT urinalysis dipstick (new)  Result Value Ref Range   Color, UA yellow    Clarity, UA clear    Glucose, UA neg    Bilirubin, UA negative    Bilirubin, UA negative    Spec Grav, UA 1.020 1.005 - 1.03   Blood, UA negative    pH, UA 6.0 5 - 8   Protein Ur, POC negative    Urobilinogen, UA 0.2 0 - 1   Nitrite, UA Negative    Leukocytes, UA Negative    UA normal  MDM   1. Abdominal wall strain, initial encounter   2. Polyuria    No evidence of urinary tract infection or acute abdomen or abdominal wall hernia. Treatment options discussed, as well as risks, benefits, alternatives. Patient voiced understanding and agreement with the following plans: Heat, other symptomatic care. Discussed. Advised not to wear a belt to hold up his pants, as the belt seems to be pressing against the sore,obese area on abdominal wall. He requested prescription for Ultram traMADol (ULTRAM) 50 MG tablet Take 1 tablet (50 mg total) by mouth every 6 (six) hours as needed for severe pain. Any further refills would need to be decided by Dr. Dianah Field. 58 tablet,No RF  Follow-up with your primary care doctor in 5 days if not improving, or sooner if symptoms become worse. Precautions discussed. Red flags discussed. Questions invited and answered. Patient and Wife voiced understanding and agreement.       Jacqulyn Cane, MD 09/17/14 2133

## 2014-09-15 NOTE — ED Notes (Signed)
Lower abdominal pain intermittently for3-4 weeks after lifting heavy items, polyuria, saw Dr Darene Lamer on Wed on this week but it wasn't bothering him then.Clinton Ortiz

## 2014-09-17 ENCOUNTER — Other Ambulatory Visit: Payer: Self-pay | Admitting: Sports Medicine

## 2014-09-20 DIAGNOSIS — I10 Essential (primary) hypertension: Secondary | ICD-10-CM | POA: Diagnosis not present

## 2014-09-20 DIAGNOSIS — E119 Type 2 diabetes mellitus without complications: Secondary | ICD-10-CM | POA: Diagnosis not present

## 2014-09-20 DIAGNOSIS — E669 Obesity, unspecified: Secondary | ICD-10-CM | POA: Diagnosis not present

## 2014-09-21 ENCOUNTER — Encounter (HOSPITAL_BASED_OUTPATIENT_CLINIC_OR_DEPARTMENT_OTHER): Payer: Self-pay

## 2014-09-21 ENCOUNTER — Ambulatory Visit (HOSPITAL_BASED_OUTPATIENT_CLINIC_OR_DEPARTMENT_OTHER)
Admission: RE | Admit: 2014-09-21 | Discharge: 2014-09-21 | Disposition: A | Discharge status: Home / Self Care | Payer: Medicare Other | Source: Ambulatory Visit | Attending: Sports Medicine | Admitting: Sports Medicine

## 2014-09-21 DIAGNOSIS — I6523 Occlusion and stenosis of bilateral carotid arteries: Secondary | ICD-10-CM | POA: Diagnosis not present

## 2014-09-21 DIAGNOSIS — Z85810 Personal history of malignant neoplasm of tongue: Secondary | ICD-10-CM | POA: Insufficient documentation

## 2014-09-21 DIAGNOSIS — R221 Localized swelling, mass and lump, neck: Secondary | ICD-10-CM | POA: Diagnosis not present

## 2014-09-21 HISTORY — DX: Disorder of kidney and ureter, unspecified: N28.9

## 2014-09-21 LAB — COMPREHENSIVE METABOLIC PANEL WITH GFR
CO2: 29 meq/L (ref 19–32)
Creat: 1.57 mg/dL — ABNORMAL HIGH (ref 0.50–1.35)
Sodium: 142 meq/L (ref 135–145)
Total Protein: 6.3 g/dL (ref 6.0–8.3)

## 2014-09-21 LAB — COMPREHENSIVE METABOLIC PANEL
ALT: 23 U/L (ref 0–53)
AST: 21 U/L (ref 0–37)
Albumin: 4 g/dL (ref 3.5–5.2)
Alkaline Phosphatase: 91 U/L (ref 39–117)
BUN: 25 mg/dL — ABNORMAL HIGH (ref 6–23)
Calcium: 9.3 mg/dL (ref 8.4–10.5)
Chloride: 107 mEq/L (ref 96–112)
Glucose, Bld: 107 mg/dL — ABNORMAL HIGH (ref 70–99)
Potassium: 4.9 mEq/L (ref 3.5–5.3)
Total Bilirubin: 0.6 mg/dL (ref 0.2–1.2)

## 2014-09-21 LAB — CBC
HCT: 44.8 % (ref 39.0–52.0)
Hemoglobin: 14.9 g/dL (ref 13.0–17.0)
MCH: 31.8 pg (ref 26.0–34.0)
MCHC: 33.3 g/dL (ref 30.0–36.0)
MCV: 95.7 fL (ref 78.0–100.0)
MPV: 13 fL — ABNORMAL HIGH (ref 8.6–12.4)
Platelets: 167 K/uL (ref 150–400)
RBC: 4.68 MIL/uL (ref 4.22–5.81)
RDW: 14 % (ref 11.5–15.5)
WBC: 6.5 K/uL (ref 4.0–10.5)

## 2014-09-21 LAB — TSH: TSH: 3.552 u[IU]/mL (ref 0.350–4.500)

## 2014-09-21 LAB — HEMOGLOBIN A1C
Hgb A1c MFr Bld: 6.8 % — ABNORMAL HIGH (ref <5.7)
Mean Plasma Glucose: 148 mg/dL — ABNORMAL HIGH (ref <117)

## 2014-09-21 MED ORDER — IOHEXOL 300 MG/ML  SOLN
60.0000 mL | Freq: Once | INTRAMUSCULAR | Status: AC | PRN
  Administered 2014-09-21: 60 mL via INTRAVENOUS

## 2014-10-13 DIAGNOSIS — C642 Malignant neoplasm of left kidney, except renal pelvis: Secondary | ICD-10-CM | POA: Diagnosis not present

## 2014-10-13 DIAGNOSIS — N200 Calculus of kidney: Secondary | ICD-10-CM | POA: Diagnosis not present

## 2014-10-13 DIAGNOSIS — Z483 Aftercare following surgery for neoplasm: Secondary | ICD-10-CM | POA: Diagnosis not present

## 2014-10-13 DIAGNOSIS — I2510 Atherosclerotic heart disease of native coronary artery without angina pectoris: Secondary | ICD-10-CM | POA: Diagnosis not present

## 2014-10-13 DIAGNOSIS — C649 Malignant neoplasm of unspecified kidney, except renal pelvis: Secondary | ICD-10-CM | POA: Diagnosis not present

## 2014-10-13 DIAGNOSIS — Z905 Acquired absence of kidney: Secondary | ICD-10-CM | POA: Diagnosis not present

## 2014-10-13 DIAGNOSIS — N289 Disorder of kidney and ureter, unspecified: Secondary | ICD-10-CM | POA: Diagnosis not present

## 2014-10-30 DIAGNOSIS — L570 Actinic keratosis: Secondary | ICD-10-CM | POA: Diagnosis not present

## 2014-10-30 DIAGNOSIS — L821 Other seborrheic keratosis: Secondary | ICD-10-CM | POA: Diagnosis not present

## 2014-10-30 DIAGNOSIS — L218 Other seborrheic dermatitis: Secondary | ICD-10-CM | POA: Diagnosis not present

## 2014-10-30 DIAGNOSIS — L579 Skin changes due to chronic exposure to nonionizing radiation, unspecified: Secondary | ICD-10-CM | POA: Diagnosis not present

## 2014-11-23 ENCOUNTER — Other Ambulatory Visit: Payer: Self-pay | Admitting: Sports Medicine

## 2014-11-23 MED ORDER — TRAMADOL HCL 50 MG PO TABS: 50.0000 mg | ORAL_TABLET | Freq: Four times a day (QID) | ORAL | Status: DC | PRN

## 2014-11-27 NOTE — Telephone Encounter (Signed)
RX already faxed to [facility]. Rhonda Cunningham,CMA

## 2014-12-19 DIAGNOSIS — C021 Malignant neoplasm of border of tongue: Secondary | ICD-10-CM | POA: Diagnosis not present

## 2015-01-01 ENCOUNTER — Telehealth: Payer: Self-pay

## 2015-01-01 MED ORDER — TRAMADOL HCL 50 MG PO TABS: 50.0000 mg | ORAL_TABLET | Freq: Four times a day (QID) | ORAL | Status: DC | PRN

## 2015-01-01 NOTE — Telephone Encounter (Signed)
Patient request refill for Tramadol. #[phone] refills faxed to [facility]. Oneta Rack

## 2015-01-10 ENCOUNTER — Other Ambulatory Visit: Payer: Self-pay | Admitting: Sports Medicine

## 2015-02-19 DIAGNOSIS — N183 Chronic kidney disease, stage 3 (moderate): Secondary | ICD-10-CM | POA: Diagnosis not present

## 2015-03-11 ENCOUNTER — Other Ambulatory Visit: Payer: Self-pay | Admitting: Sports Medicine

## 2015-04-07 ENCOUNTER — Other Ambulatory Visit: Payer: Self-pay | Admitting: Sports Medicine

## 2015-04-27 ENCOUNTER — Other Ambulatory Visit: Payer: Self-pay | Admitting: Sports Medicine

## 2015-06-07 ENCOUNTER — Ambulatory Visit (INDEPENDENT_AMBULATORY_CARE_PROVIDER_SITE_OTHER): Payer: Medicare Other | Admitting: Sports Medicine

## 2015-06-07 ENCOUNTER — Telehealth: Payer: Self-pay | Admitting: Sports Medicine

## 2015-06-07 ENCOUNTER — Encounter: Payer: Self-pay | Admitting: Sports Medicine

## 2015-06-07 VITALS — BP 134/88 | HR 87 | Temp 97.8°F | Resp 18 | Wt 273.0 lb

## 2015-06-07 DIAGNOSIS — E669 Obesity, unspecified: Secondary | ICD-10-CM | POA: Diagnosis not present

## 2015-06-07 DIAGNOSIS — E119 Type 2 diabetes mellitus without complications: Secondary | ICD-10-CM | POA: Diagnosis not present

## 2015-06-07 DIAGNOSIS — J410 Simple chronic bronchitis: Secondary | ICD-10-CM | POA: Diagnosis not present

## 2015-06-07 DIAGNOSIS — Z23 Encounter for immunization: Secondary | ICD-10-CM | POA: Diagnosis not present

## 2015-06-07 DIAGNOSIS — E785 Hyperlipidemia, unspecified: Secondary | ICD-10-CM | POA: Diagnosis not present

## 2015-06-07 DIAGNOSIS — E1169 Type 2 diabetes mellitus with other specified complication: Secondary | ICD-10-CM

## 2015-06-07 LAB — LIPID PANEL
Cholesterol: 167 mg/dL (ref 125–200)
HDL: 26 mg/dL — ABNORMAL LOW (ref >=40)
LDL Cholesterol: 84 mg/dL (ref <130)
Total CHOL/HDL Ratio: 6.4 ratio — ABNORMAL HIGH (ref <=5.0)
Triglycerides: 283 mg/dL — ABNORMAL HIGH (ref <150)
VLDL: 57 mg/dL — ABNORMAL HIGH (ref <30)

## 2015-06-07 LAB — COMPREHENSIVE METABOLIC PANEL WITH GFR
BUN: 20 mg/dL (ref 7–25)
CO2: 23 mmol/L (ref 20–31)
Chloride: 107 mmol/L (ref 98–110)
Creat: 1.94 mg/dL — ABNORMAL HIGH (ref 0.70–1.18)
Glucose, Bld: 155 mg/dL — ABNORMAL HIGH (ref 65–99)
Potassium: 4.9 mmol/L (ref 3.5–5.3)
Sodium: 141 mmol/L (ref 135–146)
Total Protein: 6.7 g/dL (ref 6.1–8.1)

## 2015-06-07 LAB — COMPREHENSIVE METABOLIC PANEL
ALT: 58 U/L — ABNORMAL HIGH (ref 9–46)
AST: 56 U/L — ABNORMAL HIGH (ref 10–35)
Albumin: 4.2 g/dL (ref 3.6–5.1)
Alkaline Phosphatase: 82 U/L (ref 40–115)
Calcium: 9.3 mg/dL (ref 8.6–10.3)
Total Bilirubin: 0.7 mg/dL (ref 0.2–1.2)

## 2015-06-07 MED ORDER — UMECLIDINIUM-VILANTEROL 62.5-25 MCG/INH IN AEPB: 1.0000 | INHALATION_SPRAY | RESPIRATORY_TRACT | Status: DC

## 2015-06-07 MED ORDER — EXENATIDE ER 2 MG ~~LOC~~ PEN: 2.0000 mg | PEN_INJECTOR | SUBCUTANEOUS | Status: DC

## 2015-06-07 MED ORDER — GABAPENTIN 100 MG PO CAPS: 100.0000 mg | ORAL_CAPSULE | Freq: Two times a day (BID) | ORAL | Status: DC

## 2015-06-07 NOTE — Progress Notes (Signed)
Subjective:    CC: Bilateral foot numbness and shortness of breath with exertion  HPI: Patient presents with DOE for the past few months. He says that it has slowly come on and he notices it the most when he takes his trash cans to the curb or when he does any lifting. He does feels some SOB when getting up to go to the restroom. Patient denies any chest pain, abdominal pain, palpitations, light headedness, or visual changes during these events. He has not used his albuterol inhaler during these events as he reports quick relief with rest. He reports an inability to exercise due to ankylosing spondylitits and spends most of his time sedentary. Despite this, he says that he has lost 15lbs due to dietary changes. He denies any leg pain, increased leg swelling, or SOB upon laying down flat at night.   Patient's bilateral foot numbness is noticed mostly along the distal plantar surface of both feet. He says that it seems to come and go throughout the day and is worse at night and in the morning. He says that it has been going on for around a year now. It is associated with itching that is more proximal to the numbness along the bottom of the midfoot. He denies any weakness or shooting pains/numbness. He says that it does not feel like the radicular symptoms he had in the past prior to his spinal fusion. He denies any pain with walking. His daughter reports that his diet and lifestyle is not helpful for his diabetes, saying that he eats a poor diet and does not take anything for his diabetes.  Past medical history, Surgical history, Family history not pertinant except as noted below, Social history, Allergies, and medications have been entered into the medical record, reviewed, and no changes needed.   Review of Systems: No fevers, chills, night sweats, weight loss, or headache.   Objective:    General: Well Developed, well nourished, and in no acute distress.  Neuro: Alert and oriented x3, extra-ocular  muscles intact, sensation grossly intact.  HEENT: Normocephalic, atraumatic, pupils equal round reactive to light, neck supple, no masses, no lymphadenopathy, thyroid nonpalpable.  Skin: Warm and dry, no rashes. Cardiac: Regular rate and rhythm, no murmurs rubs or gallops, 2+ lower extremity edema bilaterally.  Respiratory: Clear to auscultation bilaterally. Not using accessory muscles, speaking in full sentences. Abdominal: NT/ND. Normoactive bowel sounds.  Right Foot: No visible erythema or swelling. Range of motion is full in all directions. Strength is 5/5 in all directions. No hallux valgus. No pes cavus or pes planus. No abnormal callus noted. No pain over the navicular prominence, or base of fifth metatarsal. No tenderness to palpation of the calcaneal insertion of plantar fascia. No pain at the Achilles insertion. No pain over the calcaneal bursa. No pain of the retrocalcaneal bursa. No tenderness to palpation over the tarsals, metatarsals, or phalanges. No hallux rigidus or limitus. No tenderness palpation over interphalangeal joints. No pain with compression of the metatarsal heads. Doral pedal pulse is 2+. Sensation is decreased along the distal plantar aspect of the foot diffusely up until the metatarsal heads. Proprioception intact in the 1st toe. Left Foot: No visible erythema or swelling. Range of motion is full in all directions. Strength is 5/5 in all directions. No hallux valgus. No pes cavus or pes planus. No abnormal callus noted. No pain over the navicular prominence, or base of fifth metatarsal. No tenderness to palpation of the calcaneal insertion of plantar fascia. No  pain at the Achilles insertion. No pain over the calcaneal bursa. No pain of the retrocalcaneal bursa. No tenderness to palpation over the tarsals, metatarsals, or phalanges. No hallux rigidus or limitus. No tenderness palpation over interphalangeal joints. No pain with compression of the  metatarsal heads. Doral pedal pulse is 2+. Sensation is decreased along the distal plantar aspect of the foot diffusely up until the metatarsal heads. Proprioception intact in the 1st toe.  Impression and Recommendations:    Patient's SOB is likely a combination of COPD progression and deconditioning due to lack of exercise. Does not seem cardiac given lack of chest pain, palpitations, lightheadedness, or orthopnea. Will Rx Anoro inhaler to be taken everyday and encouraged movement as tolerated.  Patient's numbness and tingling is most likely diabetic neuropathy given the lack of motor symptoms, lack of a radiculopathy pattern, and the distribution of sensory loss. Will check a HgbA1c as well as routine blood work today.   UPDATE: POC HgbA1c was elevated. Will Rx Bydureon for DM control and gabapentin for symptom control. Patient instructed to get routine eye exams.

## 2015-06-07 NOTE — Assessment & Plan Note (Signed)
Off of all diabetes medications, now developing some peripheral neuropathy. Starting low-dose gabapentin, rechecking A1c and we will likely start a GLP-1 agonist.  Also checking routine blood work. Foot exam performed today, due for eye exam.

## 2015-06-07 NOTE — Telephone Encounter (Signed)
Received fax for prior authorization on Bydureon sent through cover my meds waiting on authorization. - CF

## 2015-06-07 NOTE — Assessment & Plan Note (Signed)
Shortness of breath with movement, not on any controller medications. Starting [employer]

## 2015-06-08 LAB — CBC
HCT: 45 % (ref 39.0–52.0)
Hemoglobin: 15 g/dL (ref 13.0–17.0)
MCH: 31.8 pg (ref 26.0–34.0)
MCHC: 33.3 g/dL (ref 30.0–36.0)
MCV: 95.3 fL (ref 78.0–100.0)
MPV: 13.5 fL — ABNORMAL HIGH (ref 8.6–12.4)
Platelets: 178 K/uL (ref 150–400)
RBC: 4.72 MIL/uL (ref 4.22–5.81)
RDW: 13.7 % (ref 11.5–15.5)
WBC: 7.4 10*3/uL (ref 4.0–10.5)

## 2015-06-08 MED ORDER — ATORVASTATIN CALCIUM 40 MG PO TABS: 40.0000 mg | ORAL_TABLET | Freq: Every day | ORAL | Status: DC

## 2015-06-08 NOTE — Addendum Note (Signed)
Addended by: Silverio Decamp on: 06/08/2015 09:07 AM   Modules accepted: Orders, Medications

## 2015-06-08 NOTE — Assessment & Plan Note (Signed)
Persistently elevated, switching to atorvastatin, recheck in 3 months. Minimal transaminitis likely secondary to hepatic steatosis, we will monitor this.

## 2015-06-15 NOTE — Telephone Encounter (Signed)
Received information back from insurance company, Rx was denied until Pt tries and fails their preferred formulary. Will put information in ordering providers box for review.

## 2015-06-20 LAB — HM DIABETES EYE EXAM

## 2015-07-05 ENCOUNTER — Ambulatory Visit (INDEPENDENT_AMBULATORY_CARE_PROVIDER_SITE_OTHER): Payer: Medicare Other | Admitting: Sports Medicine

## 2015-07-05 ENCOUNTER — Encounter: Payer: Self-pay | Admitting: Sports Medicine

## 2015-07-05 DIAGNOSIS — J410 Simple chronic bronchitis: Secondary | ICD-10-CM

## 2015-07-05 DIAGNOSIS — E669 Obesity, unspecified: Secondary | ICD-10-CM | POA: Diagnosis not present

## 2015-07-05 DIAGNOSIS — E1169 Type 2 diabetes mellitus with other specified complication: Secondary | ICD-10-CM

## 2015-07-05 DIAGNOSIS — E119 Type 2 diabetes mellitus without complications: Secondary | ICD-10-CM

## 2015-07-05 MED ORDER — LIRAGLUTIDE 18 MG/3ML ~~LOC~~ SOPN: PEN_INJECTOR | SUBCUTANEOUS | Status: DC

## 2015-07-05 MED ORDER — FLUTICASONE FUROATE 100 MCG/ACT IN AEPB: 1.0000 | INHALATION_SPRAY | Freq: Every day | RESPIRATORY_TRACT | Status: DC

## 2015-07-05 MED ORDER — UMECLIDINIUM-VILANTEROL 62.5-25 MCG/INH IN AEPB: 1.0000 | INHALATION_SPRAY | Freq: Every day | RESPIRATORY_TRACT | Status: DC

## 2015-07-05 NOTE — Assessment & Plan Note (Signed)
Bydureon was not covered, switching to [employer]

## 2015-07-05 NOTE — Assessment & Plan Note (Signed)
Continue Anoro, no samples left so we are going to write prescriptions for this and

## 2015-07-05 NOTE — Progress Notes (Signed)
  Subjective:    CC: "Shortness of Breath"  HPI: Patient is a 74 year old male presenting for follow up for Anoro. Patient states that Anoro has minimally relieved his shortness of breath. Patient states that he feels like weight loss would help with shortness of breath. Patient states that GLP 1 agonist was not approved by his insurance. Patient would like to discuss other medications that could help control his diabetes. Patient states that he has otherwise been well.   Past medical history, Surgical history, Family history not pertinant except as noted below, Social history, Allergies, and medications have been entered into the medical record, reviewed, and no changes needed.   Review of Systems: No fevers, chills, night sweats, weight loss or chest pain  Objective:    General: Well Developed, well nourished, and in no acute distress.  Neuro: Alert and oriented x3, extra-ocular muscles intact, sensation grossly intact.  HEENT: Normocephalic, atraumatic, pupils equal round reactive to light, neck supple, no masses, no lymphadenopathy, thyroid nonpalpable.  Skin: Warm and dry, no rashes. Cardiac: Regular rate and rhythm, no murmurs rubs or gallops, no lower extremity edema.  Respiratory: Clear to auscultation bilaterally. Not using accessory muscles, speaking in full sentences.    Impression and Recommendations:    1. COPD: Continue Anoro. Patient was prescribed inhaled corticosteroid. Patient will follow up in one month.   2. Diabetes: Patient will continue on gabapentin for peripheral neuropathy. Patient was started on Victoza, which should also help patient meet weight loss goals.   3. Abnormal weight gain: Patient was advised to increase physical activity. Patient was advised to try swimming at [facility].

## 2015-07-06 ENCOUNTER — Telehealth: Payer: Self-pay | Admitting: Sports Medicine

## 2015-07-06 NOTE — Telephone Encounter (Signed)
Received fax for prior authorization on Victoza sent through cover my meds waiting on authorization. - CF

## 2015-07-06 NOTE — Telephone Encounter (Signed)
Received fax for prior authorization on Arnuity 100 mcg sent through cover my meds waiting on authorization. - CF

## 2015-07-09 NOTE — Telephone Encounter (Signed)
Medication was denied through cover my meds. - CF

## 2015-07-09 NOTE — Telephone Encounter (Signed)
Medication was denied through cover my meds - CF

## 2015-07-11 ENCOUNTER — Telehealth: Payer: Self-pay | Admitting: *Deleted

## 2015-07-17 ENCOUNTER — Telehealth: Payer: Self-pay

## 2015-07-17 NOTE — Telephone Encounter (Signed)
Clinton Ortiz called about her dad's medications, Anora Ellipta and Victoza. She states the Anora Ellipta should have been PA'ed for COPD and the Victoza for DM. I am not sure if there has been a PA started on these medications.

## 2015-07-18 NOTE — Telephone Encounter (Signed)
Checked on PA for victoza and it has been denied, Arnuity Ellipta has also been denied;  I have not received a paper copy of denial letter as of yet;will rout this phone note to provider to make him aware

## 2015-07-19 NOTE — Telephone Encounter (Signed)
Have him come and pick up some samples of Anoro, VIctoza cannot be used, looks like he has terrible insurance and won't be able to use a GLP 1 agonist.

## 2015-07-20 ENCOUNTER — Encounter: Payer: Self-pay | Admitting: *Deleted

## 2015-07-20 MED ORDER — TIOTROPIUM BROMIDE-OLODATEROL 2.5-2.5 MCG/ACT IN AERS: 2.0000 | INHALATION_SPRAY | Freq: Every day | RESPIRATORY_TRACT | Status: DC

## 2015-07-20 NOTE — Telephone Encounter (Signed)
We don't have anoro samples

## 2015-07-20 NOTE — Telephone Encounter (Signed)
That's ok we can use stiolto, I'll call some in and he can come for samples.

## 2015-07-23 ENCOUNTER — Other Ambulatory Visit: Payer: Self-pay

## 2015-07-23 MED ORDER — TAMSULOSIN HCL 0.4 MG PO CAPS: ORAL_CAPSULE | ORAL | Status: DC

## 2015-07-23 NOTE — Telephone Encounter (Signed)
Harrison left at the front and left message on patients vm

## 2015-07-23 NOTE — Telephone Encounter (Signed)
closed

## 2015-08-02 ENCOUNTER — Ambulatory Visit: Payer: Medicare Other | Admitting: Sports Medicine

## 2015-08-03 ENCOUNTER — Encounter: Payer: Self-pay | Admitting: Sports Medicine

## 2015-08-03 ENCOUNTER — Ambulatory Visit (INDEPENDENT_AMBULATORY_CARE_PROVIDER_SITE_OTHER): Payer: Medicare Other | Admitting: Sports Medicine

## 2015-08-03 VITALS — BP 139/85 | HR 93 | Resp 20 | Wt 268.5 lb

## 2015-08-03 DIAGNOSIS — E119 Type 2 diabetes mellitus without complications: Secondary | ICD-10-CM

## 2015-08-03 DIAGNOSIS — E1169 Type 2 diabetes mellitus with other specified complication: Secondary | ICD-10-CM

## 2015-08-03 DIAGNOSIS — J410 Simple chronic bronchitis: Secondary | ICD-10-CM

## 2015-08-03 DIAGNOSIS — E669 Obesity, unspecified: Secondary | ICD-10-CM

## 2015-08-03 MED ORDER — ASPIRIN EC 81 MG PO TBEC: 81.0000 mg | DELAYED_RELEASE_TABLET | Freq: Every day | ORAL | Status: DC

## 2015-08-03 MED ORDER — DULAGLUTIDE 1.5 MG/0.5ML ~~LOC~~ SOAJ: 1.5000 mg | SUBCUTANEOUS | Status: DC

## 2015-08-03 NOTE — Progress Notes (Signed)
  Subjective:    CC: Follow-up  HPI: COPD: Unclear as to exactly which inhalers he's using and when. Symptoms are overall unchanged.  Obesity/diabetes: So far Victoza has been denied as well as Bydureon. On review of his formulary Victoza, Bydureon, Trulicity are all Tier 3.  Past medical history, Surgical history, Family history not pertinant except as noted below, Social history, Allergies, and medications have been entered into the medical record, reviewed, and no changes needed.   Review of Systems: No fevers, chills, night sweats, weight loss, chest pain, or shortness of breath.   Objective:    General: Well Developed, well nourished, and in no acute distress.  Neuro: Alert and oriented x3, extra-ocular muscles intact, sensation grossly intact.  HEENT: Normocephalic, atraumatic, pupils equal round reactive to light, neck supple, no masses, no lymphadenopathy, thyroid nonpalpable.  Skin: Warm and dry, no rashes. Cardiac: Regular rate and rhythm, no murmurs rubs or gallops, no lower extremity edema.  Respiratory: Clear to auscultation bilaterally. Not using accessory muscles, speaking in full sentences.  Impression and Recommendations:

## 2015-08-03 NOTE — Assessment & Plan Note (Signed)
Should be using Stiolto and Arnuity.

## 2015-08-08 ENCOUNTER — Telehealth: Payer: Self-pay | Admitting: *Deleted

## 2015-08-08 NOTE — Telephone Encounter (Signed)
Trulicity approved through insurance through 05/11/2016. If dose increases will need a new PA   Ref # [id number]  Pt and pharm notified

## 2015-08-18 ENCOUNTER — Other Ambulatory Visit: Payer: Self-pay | Admitting: Sports Medicine

## 2015-08-20 NOTE — Telephone Encounter (Signed)
Is this a medication that you would be willing to refill while Dr. Darene Lamer is out of the office this week?

## 2015-08-30 ENCOUNTER — Ambulatory Visit: Payer: Medicare Other | Admitting: Sports Medicine

## 2015-08-30 ENCOUNTER — Ambulatory Visit (INDEPENDENT_AMBULATORY_CARE_PROVIDER_SITE_OTHER): Payer: Medicare Other | Admitting: Sports Medicine

## 2015-08-30 VITALS — BP 139/88 | HR 73 | Resp 16 | Wt 266.9 lb

## 2015-08-30 DIAGNOSIS — J410 Simple chronic bronchitis: Secondary | ICD-10-CM | POA: Diagnosis not present

## 2015-08-30 DIAGNOSIS — E1169 Type 2 diabetes mellitus with other specified complication: Secondary | ICD-10-CM

## 2015-08-30 DIAGNOSIS — E669 Obesity, unspecified: Secondary | ICD-10-CM | POA: Diagnosis not present

## 2015-08-30 DIAGNOSIS — E119 Type 2 diabetes mellitus without complications: Secondary | ICD-10-CM | POA: Diagnosis not present

## 2015-08-30 LAB — POCT GLYCOSYLATED HEMOGLOBIN (HGB A1C): Hemoglobin A1C: 7.8

## 2015-08-30 NOTE — Assessment & Plan Note (Signed)
A1c is controlled but not perfect, continue Trulicity, he's only been doing shots for 3 weeks now, return in 3 months to recheck A1c

## 2015-08-30 NOTE — Progress Notes (Signed)
  Subjective:    CC: Medication follow up for Diabetes and COPD.  HPI: Patient is here today for a follow up of his diabetes and COPD. Patient started Trulicity 3 weeks ago and states this has been going well for him. He has no concerns regarding this medication.   Patient is using his Fluticasone inhaler daily and the Albuterol inhaler as needed. He has not been using his Stiolto inhaler. He reports some productive cough when he wakes up in the morning, but this typically improves throughout the day. He denies wheezing, shortness of breath, or difficulty breathing.  Past medical history, Surgical history, Family history not pertinant except as noted below, Social history, Allergies, and medications have been entered into the medical record, reviewed, and no changes needed.   Review of Systems: No fevers, chills, night sweats, weight loss, chest pain, or shortness of breath.   Objective:    General: Well Developed, well nourished, and in no acute distress.  Neuro: Alert and oriented x3, extra-ocular muscles intact, sensation grossly intact.  HEENT: Normocephalic, atraumatic, pupils equal round reactive to light, neck supple, no masses, no lymphadenopathy, thyroid nonpalpable.  Skin: Warm and dry, no rashes. Cardiac: Regular rate and rhythm, no murmurs rubs or gallops, no lower extremity edema.  Respiratory: Clear to auscultation bilaterally. Not using accessory muscles, speaking in full sentences.   Impression and Recommendations:   1. COPD- Patient reports productive cough in the mornings. He has been noncompliant with his inhalers and not using them as prescribed. Educated patient and demonstrated Stiolto inhaler use in the office today. Encouraged patient to use this inhaler daily. Continue using daily Fluticasone inhaler and Albuterol inhaler as needed.   2. Diabetes Mellitus- A1c 7.8. Patient began Trulicity three weeks ago. Will recheck A1c in 3 months.   3. Obesity- Discussed  weight. Advised patient to avoid simple carbohydrates and simple sugars and encouraged increasing daily activity. Patient started Trulicity 3 weeks ago and has had a 2 pound weight loss.   I spent 25 minutes with this patient, greater than 50% was face-to-face time counseling regarding the above diagnoses

## 2015-08-30 NOTE — Assessment & Plan Note (Signed)
Noncompliant with inhalers, he is only doing his inhaled fluticasone, I gave him some Stiolto samples and showed him how to use it.

## 2015-10-12 DIAGNOSIS — N398 Other specified disorders of urinary system: Secondary | ICD-10-CM | POA: Diagnosis not present

## 2015-10-12 DIAGNOSIS — C642 Malignant neoplasm of left kidney, except renal pelvis: Secondary | ICD-10-CM | POA: Diagnosis not present

## 2015-10-19 ENCOUNTER — Other Ambulatory Visit: Payer: Self-pay | Admitting: Sports Medicine

## 2015-11-21 ENCOUNTER — Other Ambulatory Visit: Payer: Self-pay

## 2015-11-21 MED ORDER — TAMSULOSIN HCL 0.4 MG PO CAPS: ORAL_CAPSULE | ORAL | Status: DC

## 2015-11-27 ENCOUNTER — Other Ambulatory Visit: Payer: Self-pay | Admitting: Sports Medicine

## 2015-11-29 ENCOUNTER — Ambulatory Visit (INDEPENDENT_AMBULATORY_CARE_PROVIDER_SITE_OTHER): Payer: Medicare Other | Admitting: Sports Medicine

## 2015-11-29 ENCOUNTER — Encounter: Payer: Self-pay | Admitting: Sports Medicine

## 2015-11-29 DIAGNOSIS — Z23 Encounter for immunization: Secondary | ICD-10-CM

## 2015-11-29 DIAGNOSIS — I10 Essential (primary) hypertension: Secondary | ICD-10-CM | POA: Diagnosis not present

## 2015-11-29 DIAGNOSIS — M5136 Other intervertebral disc degeneration, lumbar region: Secondary | ICD-10-CM | POA: Diagnosis not present

## 2015-11-29 DIAGNOSIS — M51369 Other intervertebral disc degeneration, lumbar region without mention of lumbar back pain or lower extremity pain: Secondary | ICD-10-CM

## 2015-11-29 DIAGNOSIS — E119 Type 2 diabetes mellitus without complications: Secondary | ICD-10-CM

## 2015-11-29 DIAGNOSIS — E669 Obesity, unspecified: Secondary | ICD-10-CM

## 2015-11-29 DIAGNOSIS — E1169 Type 2 diabetes mellitus with other specified complication: Secondary | ICD-10-CM

## 2015-11-29 LAB — CBC
HCT: 47.5 % (ref 38.5–50.0)
Hemoglobin: 15.8 g/dL (ref 13.2–17.1)
MCH: 31.9 pg (ref 27.0–33.0)
MCHC: 33.3 g/dL (ref 32.0–36.0)
MCV: 96 fL (ref 80.0–100.0)
MPV: 12.6 fL — ABNORMAL HIGH (ref 7.5–12.5)
Platelets: 194 10*3/uL (ref 140–400)
RBC: 4.95 MIL/uL (ref 4.20–5.80)
RDW: 14 % (ref 11.0–15.0)
WBC: 8.9 K/uL (ref 3.8–10.8)

## 2015-11-29 LAB — LIPID PANEL
Cholesterol: 154 mg/dL (ref 125–200)
HDL: 34 mg/dL — ABNORMAL LOW (ref >=40)
LDL Cholesterol: 68 mg/dL (ref <130)
Total CHOL/HDL Ratio: 4.5 Ratio (ref <=5.0)
Triglycerides: 262 mg/dL — ABNORMAL HIGH (ref <150)
VLDL: 52 mg/dL — ABNORMAL HIGH (ref <30)

## 2015-11-29 LAB — COMPREHENSIVE METABOLIC PANEL WITH GFR
ALT: 34 U/L (ref 9–46)
AST: 29 U/L (ref 10–35)
Alkaline Phosphatase: 88 U/L (ref 40–115)
BUN: 23 mg/dL (ref 7–25)
Creat: 1.83 mg/dL — ABNORMAL HIGH (ref 0.70–1.18)
Glucose, Bld: 129 mg/dL — ABNORMAL HIGH (ref 65–99)
Potassium: 4.8 mmol/L (ref 3.5–5.3)
Sodium: 143 mmol/L (ref 135–146)
Total Protein: 7 g/dL (ref 6.1–8.1)

## 2015-11-29 LAB — COMPREHENSIVE METABOLIC PANEL
Albumin: 4.2 g/dL (ref 3.6–5.1)
CO2: 25 mmol/L (ref 20–31)
Calcium: 9.5 mg/dL (ref 8.6–10.3)
Chloride: 107 mmol/L (ref 98–110)
Total Bilirubin: 0.8 mg/dL (ref 0.2–1.2)

## 2015-11-29 NOTE — Assessment & Plan Note (Signed)
Currently in the doughnut hole, medications of gone up in price. Checking a hemoglobin A1c downstairs. Up-to-date on all screening measures.

## 2015-11-29 NOTE — Assessment & Plan Note (Signed)
Good response to a left L4-L5 interlaminar epidural about 2 years ago, repeating injection today, pain is in the left hip and buttock but not referable to the joint on exam.

## 2015-11-29 NOTE — Assessment & Plan Note (Signed)
Due for pneumococcal vaccination

## 2015-11-29 NOTE — Progress Notes (Signed)
  Subjective:    CC: Follow-up  HPI:  Diabetes mellitus type 2: Compliant with all the medications, they did go up in price, he is in the donut hole. Random blood sugar today was in the 140s, we are out of hemoglobin A1c cartridges.  Left hip pain: History of lumbar degenerative disc disease with radiculopathy, 2 years ago we did in the left L4-L5 interlaminar epidural that provided good relief, he is agreeable to repeat.  Hypertension: Well controlled  Hyperlipidemia: Due to recheck  Preventive measures: Due for pneumococcal 13  Past medical history, Surgical history, Family history not pertinant except as noted below, Social history, Allergies, and medications have been entered into the medical record, reviewed, and no changes needed.   Review of Systems: No fevers, chills, night sweats, weight loss, chest pain, or shortness of breath.   Objective:    General: Well Developed, well nourished, and in no acute distress.  Neuro: Alert and oriented x3, extra-ocular muscles intact, sensation grossly intact.  HEENT: Normocephalic, atraumatic, pupils equal round reactive to light, neck supple, no masses, no lymphadenopathy, thyroid nonpalpable.  Skin: Warm and dry, no rashes. Cardiac: Regular rate and rhythm, no murmurs rubs or gallops, no lower extremity edema.  Respiratory: Clear to auscultation bilaterally. Not using accessory muscles, speaking in full sentences.  Impression and Recommendations:    I spent 25 minutes with this patient, greater than 50% was face-to-face time counseling regarding the above diagnoses

## 2015-11-29 NOTE — Assessment & Plan Note (Signed)
Well controlled, no changes 

## 2015-11-30 LAB — HEMOGLOBIN A1C
Hgb A1c MFr Bld: 7.2 % — ABNORMAL HIGH (ref <5.7)
Mean Plasma Glucose: 160 mg/dL

## 2015-12-06 DIAGNOSIS — D125 Benign neoplasm of sigmoid colon: Secondary | ICD-10-CM | POA: Diagnosis not present

## 2015-12-06 DIAGNOSIS — Z86010 Personal history of colonic polyps: Secondary | ICD-10-CM | POA: Diagnosis not present

## 2015-12-07 ENCOUNTER — Ambulatory Visit
Admission: RE | Admit: 2015-12-07 | Discharge: 2015-12-07 | Disposition: A | Discharge status: Home / Self Care | Payer: Medicare Other | Source: Ambulatory Visit | Attending: Sports Medicine | Admitting: Sports Medicine

## 2015-12-07 VITALS — BP 159/89 | HR 75

## 2015-12-07 DIAGNOSIS — M5136 Other intervertebral disc degeneration, lumbar region: Secondary | ICD-10-CM

## 2015-12-07 DIAGNOSIS — M47817 Spondylosis without myelopathy or radiculopathy, lumbosacral region: Secondary | ICD-10-CM | POA: Diagnosis not present

## 2015-12-07 MED ORDER — IOPAMIDOL (ISOVUE-M 200) INJECTION 41%
1.0000 mL | Freq: Once | INTRAMUSCULAR | Status: AC
  Administered 2015-12-07: 1 mL via EPIDURAL

## 2015-12-07 MED ORDER — METHYLPREDNISOLONE ACETATE 40 MG/ML INJ SUSP (RADIOLOG
120.0000 mg | Freq: Once | INTRAMUSCULAR | Status: AC
  Administered 2015-12-07: 120 mg via EPIDURAL

## 2015-12-07 NOTE — Discharge Instructions (Signed)

## 2015-12-21 ENCOUNTER — Other Ambulatory Visit: Payer: Self-pay | Admitting: Sports Medicine

## 2015-12-25 DIAGNOSIS — Z9089 Acquired absence of other organs: Secondary | ICD-10-CM | POA: Diagnosis not present

## 2015-12-25 DIAGNOSIS — Z08 Encounter for follow-up examination after completed treatment for malignant neoplasm: Secondary | ICD-10-CM | POA: Diagnosis not present

## 2015-12-25 DIAGNOSIS — Z85810 Personal history of malignant neoplasm of tongue: Secondary | ICD-10-CM | POA: Diagnosis not present

## 2015-12-28 ENCOUNTER — Other Ambulatory Visit: Payer: Self-pay | Admitting: Sports Medicine

## 2016-01-07 ENCOUNTER — Other Ambulatory Visit: Payer: Self-pay | Admitting: Sports Medicine

## 2016-02-11 ENCOUNTER — Other Ambulatory Visit: Payer: Self-pay | Admitting: Sports Medicine

## 2016-02-19 DIAGNOSIS — N183 Chronic kidney disease, stage 3 (moderate): Secondary | ICD-10-CM | POA: Diagnosis not present

## 2016-02-23 ENCOUNTER — Other Ambulatory Visit: Payer: Self-pay | Admitting: Sports Medicine

## 2016-02-27 ENCOUNTER — Other Ambulatory Visit: Payer: Self-pay | Admitting: Sports Medicine

## 2016-02-29 ENCOUNTER — Ambulatory Visit (INDEPENDENT_AMBULATORY_CARE_PROVIDER_SITE_OTHER): Payer: Medicare Other | Admitting: Sports Medicine

## 2016-02-29 ENCOUNTER — Encounter: Payer: Self-pay | Admitting: Sports Medicine

## 2016-02-29 DIAGNOSIS — E7800 Pure hypercholesterolemia, unspecified: Secondary | ICD-10-CM

## 2016-02-29 DIAGNOSIS — E669 Obesity, unspecified: Secondary | ICD-10-CM | POA: Diagnosis not present

## 2016-02-29 DIAGNOSIS — E1169 Type 2 diabetes mellitus with other specified complication: Secondary | ICD-10-CM | POA: Diagnosis not present

## 2016-02-29 DIAGNOSIS — M5136 Other intervertebral disc degeneration, lumbar region: Secondary | ICD-10-CM | POA: Diagnosis not present

## 2016-02-29 DIAGNOSIS — Z23 Encounter for immunization: Secondary | ICD-10-CM

## 2016-02-29 DIAGNOSIS — M51369 Other intervertebral disc degeneration, lumbar region without mention of lumbar back pain or lower extremity pain: Secondary | ICD-10-CM

## 2016-02-29 DIAGNOSIS — I10 Essential (primary) hypertension: Secondary | ICD-10-CM

## 2016-02-29 LAB — LIPID PANEL
Cholesterol: 150 mg/dL (ref 125–200)
HDL: 31 mg/dL — ABNORMAL LOW (ref >=40)
LDL Cholesterol: 82 mg/dL (ref <130)
Total CHOL/HDL Ratio: 4.8 Ratio (ref <=5.0)
Triglycerides: 187 mg/dL — ABNORMAL HIGH (ref <150)
VLDL: 37 mg/dL — ABNORMAL HIGH (ref <30)

## 2016-02-29 LAB — COMPREHENSIVE METABOLIC PANEL WITH GFR
ALT: 35 U/L (ref 9–46)
AST: 30 U/L (ref 10–35)
Alkaline Phosphatase: 83 U/L (ref 40–115)
BUN: 28 mg/dL — ABNORMAL HIGH (ref 7–25)
Creat: 1.97 mg/dL — ABNORMAL HIGH (ref 0.70–1.18)
Sodium: 141 mmol/L (ref 135–146)
Total Protein: 6.5 g/dL (ref 6.1–8.1)

## 2016-02-29 LAB — COMPREHENSIVE METABOLIC PANEL
Albumin: 4.2 g/dL (ref 3.6–5.1)
CO2: 22 mmol/L (ref 20–31)
Calcium: 9.4 mg/dL (ref 8.6–10.3)
Chloride: 107 mmol/L (ref 98–110)
Glucose, Bld: 136 mg/dL — ABNORMAL HIGH (ref 65–99)
Potassium: 4.5 mmol/L (ref 3.5–5.3)
Total Bilirubin: 0.7 mg/dL (ref 0.2–1.2)

## 2016-02-29 LAB — POCT GLYCOSYLATED HEMOGLOBIN (HGB A1C): Hemoglobin A1C: 7.2

## 2016-02-29 NOTE — Assessment & Plan Note (Signed)
Rechecking lipid panel today, if hyperglyceridemia is persistent we will add fenofibrate to his Pravachol.

## 2016-02-29 NOTE — Assessment & Plan Note (Signed)
Well controlled, no changes 

## 2016-02-29 NOTE — Assessment & Plan Note (Addendum)
1 month response to left L4-L5 interlaminar epidural, we are going to repeat this. If insufficient relief we will proceed with facet joint injections. I think he is due for a new MRI.

## 2016-02-29 NOTE — Progress Notes (Signed)
  Subjective:    CC: Follow-up  HPI: Hyperlipidemia: It have significant hypertriglyceridemia despite pravastatin, agreeable to recheck today and start medication if still elevated.  Hypertension: Well controlled  Low back pain: Temporary response to lumbar epidural back in July. Agreeable to proceed with another injection.  Diabetes mellitus type 2: Stable, hemoglobin A1c is 7.2, no questions or concerns.  Past medical history:  Negative.  See flowsheet/record as well for more information.  Surgical history: Negative.  See flowsheet/record as well for more information.  Family history: Negative.  See flowsheet/record as well for more information.  Social history: Negative.  See flowsheet/record as well for more information.  Allergies, and medications have been entered into the medical record, reviewed, and no changes needed.   Review of Systems: No fevers, chills, night sweats, weight loss, chest pain, or shortness of breath.   Objective:    General: Well Developed, well nourished, and in no acute distress.  Neuro: Alert and oriented x3, extra-ocular muscles intact, sensation grossly intact.  HEENT: Normocephalic, atraumatic, pupils equal round reactive to light, neck supple, no masses, no lymphadenopathy, thyroid nonpalpable.  Skin: Warm and dry, no rashes. Cardiac: Regular rate and rhythm, no murmurs rubs or gallops, no lower extremity edema.  Respiratory: Clear to auscultation bilaterally. Not using accessory muscles, speaking in full sentences.  Impression and Recommendations:    Lumbar degenerative disc disease 1 month response to left L4-L5 interlaminar epidural, we are going to repeat this. If insufficient relief we will proceed with facet joint injections. I think he is due for a new MRI.  Hypertension Well-controlled, no changes  Hyperlipidemia Rechecking lipid panel today, if hyperglyceridemia is persistent we will add fenofibrate to his Pravachol.  Diabetes  mellitus type 2 in obese Diabetes is well-controlled, no changes.

## 2016-02-29 NOTE — Assessment & Plan Note (Signed)
Diabetes is well-controlled, no changes.

## 2016-03-06 ENCOUNTER — Other Ambulatory Visit: Payer: Self-pay | Admitting: Sports Medicine

## 2016-03-06 DIAGNOSIS — Z23 Encounter for immunization: Secondary | ICD-10-CM

## 2016-03-06 DIAGNOSIS — M5136 Other intervertebral disc degeneration, lumbar region: Secondary | ICD-10-CM

## 2016-03-07 ENCOUNTER — Other Ambulatory Visit: Payer: Self-pay | Admitting: Sports Medicine

## 2016-03-07 DIAGNOSIS — M5136 Other intervertebral disc degeneration, lumbar region: Secondary | ICD-10-CM

## 2016-03-12 ENCOUNTER — Other Ambulatory Visit: Payer: Self-pay | Admitting: Sports Medicine

## 2016-03-16 ENCOUNTER — Ambulatory Visit
Admission: RE | Admit: 2016-03-16 | Discharge: 2016-03-16 | Disposition: A | Discharge status: Home / Self Care | Payer: Medicare Other | Source: Ambulatory Visit | Attending: Sports Medicine | Admitting: Sports Medicine

## 2016-03-16 DIAGNOSIS — M5136 Other intervertebral disc degeneration, lumbar region: Secondary | ICD-10-CM

## 2016-03-16 DIAGNOSIS — M5126 Other intervertebral disc displacement, lumbar region: Secondary | ICD-10-CM | POA: Diagnosis not present

## 2016-03-17 ENCOUNTER — Ambulatory Visit
Admission: RE | Admit: 2016-03-17 | Discharge: 2016-03-17 | Disposition: A | Discharge status: Home / Self Care | Payer: Medicare Other | Source: Ambulatory Visit | Attending: Sports Medicine | Admitting: Sports Medicine

## 2016-03-17 DIAGNOSIS — M545 Low back pain: Secondary | ICD-10-CM | POA: Diagnosis not present

## 2016-03-17 MED ORDER — METHYLPREDNISOLONE ACETATE 40 MG/ML INJ SUSP (RADIOLOG
120.0000 mg | Freq: Once | INTRAMUSCULAR | Status: AC
  Administered 2016-03-17: 120 mg via EPIDURAL

## 2016-03-17 MED ORDER — IOPAMIDOL (ISOVUE-M 200) INJECTION 41%
1.0000 mL | Freq: Once | INTRAMUSCULAR | Status: AC
  Administered 2016-03-17: 1 mL via EPIDURAL

## 2016-03-17 NOTE — Discharge Instructions (Signed)

## 2016-04-08 ENCOUNTER — Other Ambulatory Visit: Payer: Self-pay | Admitting: Sports Medicine

## 2016-04-14 ENCOUNTER — Encounter: Payer: Self-pay | Admitting: Sports Medicine

## 2016-04-14 ENCOUNTER — Ambulatory Visit (INDEPENDENT_AMBULATORY_CARE_PROVIDER_SITE_OTHER): Payer: Medicare Other | Admitting: Sports Medicine

## 2016-04-14 DIAGNOSIS — M51369 Other intervertebral disc degeneration, lumbar region without mention of lumbar back pain or lower extremity pain: Secondary | ICD-10-CM

## 2016-04-14 DIAGNOSIS — M5136 Other intervertebral disc degeneration, lumbar region: Secondary | ICD-10-CM

## 2016-04-14 NOTE — Assessment & Plan Note (Addendum)
Did not have a similar response to the left L4-L5 interlaminar epidural that he had previously, pain is also worse with standing and with twisting. Radiation deep into the buttock, facet mediated pain. We are going to proceed now with a left L3-S1 facet joint injections.  On the day of injection patient reports right-sided facet type pain. We are going to change the order to bilateral L3-S1 facet joint injections.

## 2016-04-14 NOTE — Progress Notes (Signed)
  Subjective:    CC: Follow-up  HPI: Clinton Ortiz recently had his epidural, previously his left L4-L5 interlaminar epidurals have provided great relief, unfortunately he continues to have pain, localized on the left lower back, radiation into the buttock, worse with standing and twisting. Pain is moderate, persistent without radiation, no bowel or bladder dysfunction, saddle numbness, constitutional symptoms.  Past medical history:  Negative.  See flowsheet/record as well for more information.  Surgical history: Negative.  See flowsheet/record as well for more information.  Family history: Negative.  See flowsheet/record as well for more information.  Social history: Negative.  See flowsheet/record as well for more information.  Allergies, and medications have been entered into the medical record, reviewed, and no changes needed.   Review of Systems: No fevers, chills, night sweats, weight loss, chest pain, or shortness of breath.   Objective:    General: Well Developed, well nourished, and in no acute distress.  Neuro: Alert and oriented x3, extra-ocular muscles intact, sensation grossly intact.  HEENT: Normocephalic, atraumatic, pupils equal round reactive to light, neck supple, no masses, no lymphadenopathy, thyroid nonpalpable.  Skin: Warm and dry, no rashes. Cardiac: Regular rate and rhythm, no murmurs rubs or gallops, no lower extremity edema.  Respiratory: Clear to auscultation bilaterally. Not using accessory muscles, speaking in full sentences.  Impression and Recommendations:    Lumbar degenerative disc disease Did not have a similar response to the left L4-L5 interlaminar epidural that he had previously, pain is also worse with standing and with twisting. Radiation deep into the buttock, facet mediated pain. We are going to proceed now with a left L3-S1 facet joint injections.  I spent 25 minutes with this patient, greater than 50% was face-to-face time counseling regarding the  above diagnoses

## 2016-04-17 ENCOUNTER — Other Ambulatory Visit: Payer: Self-pay

## 2016-04-22 ENCOUNTER — Telehealth: Payer: Self-pay | Admitting: Sports Medicine

## 2016-04-22 ENCOUNTER — Ambulatory Visit
Admission: RE | Admit: 2016-04-22 | Discharge: 2016-04-22 | Disposition: A | Discharge status: Home / Self Care | Payer: Medicare Other | Source: Ambulatory Visit | Attending: Sports Medicine | Admitting: Sports Medicine

## 2016-04-22 MED ORDER — IOPAMIDOL (ISOVUE-M 200) INJECTION 41%: 1.0000 mL | Freq: Once | INTRAMUSCULAR | Status: DC

## 2016-04-22 MED ORDER — METHYLPREDNISOLONE ACETATE 40 MG/ML INJ SUSP (RADIOLOG: 120.0000 mg | Freq: Once | INTRAMUSCULAR | Status: DC

## 2016-04-22 MED ORDER — PREDNISONE 50 MG PO TABS: ORAL_TABLET | ORAL | 0 refills | Status: DC

## 2016-04-22 NOTE — Addendum Note (Signed)
Addended by: Silverio Decamp on: 04/22/2016 10:55 AM   Modules accepted: Orders

## 2016-04-22 NOTE — Telephone Encounter (Signed)
Adding prednisone, he can return to see me to go over foot pain.

## 2016-04-22 NOTE — Telephone Encounter (Signed)
Dr. Jeralyn Ruths called to discuss treatment of patient. While at Dr. Nelva Bush office, patient stated that his left foot was no longer hurting and requested injections in his right foot instead. The injury to his right foot is a new injury he has not been seen for in our office. For now the injections have been cancelled and patient was told to schedule with our office to have his right foot checked. Also, please call in a prescription for prednisone. Thanks!

## 2016-04-22 NOTE — Discharge Instructions (Signed)

## 2016-04-23 ENCOUNTER — Ambulatory Visit (INDEPENDENT_AMBULATORY_CARE_PROVIDER_SITE_OTHER): Payer: Medicare Other | Admitting: Sports Medicine

## 2016-04-23 DIAGNOSIS — M5136 Other intervertebral disc degeneration, lumbar region: Secondary | ICD-10-CM | POA: Diagnosis not present

## 2016-04-23 DIAGNOSIS — M51369 Other intervertebral disc degeneration, lumbar region without mention of lumbar back pain or lower extremity pain: Secondary | ICD-10-CM

## 2016-04-23 MED ORDER — CYCLOBENZAPRINE HCL 10 MG PO TABS: ORAL_TABLET | ORAL | 0 refills | Status: DC

## 2016-04-23 NOTE — Progress Notes (Signed)
  Subjective:    CC: Back pain  HPI: This is a pleasant 75 year old male, he did respond to a lumbar epidural, we were setting him up for facet joint injections, his left-sided back pain had resolved at the time of the injection, but he was having acute onset right-sided back pain after lifting a great deal of cinderblocks previously. Pain is axial, discogenic. Nothing radicular, no bowel or bladder dysfunction, saddle numbness, constitutional symptoms.  Past medical history:  Negative.  See flowsheet/record as well for more information.  Surgical history: Negative.  See flowsheet/record as well for more information.  Family history: Negative.  See flowsheet/record as well for more information.  Social history: Negative.  See flowsheet/record as well for more information.  Allergies, and medications have been entered into the medical record, reviewed, and no changes needed.   Review of Systems: No fevers, chills, night sweats, weight loss, chest pain, or shortness of breath.   Objective:    General: Well Developed, well nourished, and in no acute distress.  Neuro: Alert and oriented x3, extra-ocular muscles intact, sensation grossly intact.  HEENT: Normocephalic, atraumatic, pupils equal round reactive to light, neck supple, no masses, no lymphadenopathy, thyroid nonpalpable.  Skin: Warm and dry, no rashes. Cardiac: Regular rate and rhythm, no murmurs rubs or gallops, no lower extremity edema.  Respiratory: Clear to auscultation bilaterally. Not using accessory muscles, speaking in full sentences. Back Exam:  Inspection: Unremarkable  Motion: Flexion 45 deg, Extension 45 deg, Side Bending to 45 deg bilaterally,  Rotation to 45 deg bilaterally  SLR laying: Negative  XSLR laying: Negative  Palpable tenderness: Fairly severe tenderness along the right paralumbar muscles. FABER: negative. Sensory change: Gross sensation intact to all lumbar and sacral dermatomes.  Reflexes: 2+ at both  patellar tendons, 2+ at achilles tendons, Babinski's downgoing.  Strength at foot  Plantar-flexion: 5/5 Dorsi-flexion: 5/5 Eversion: 5/5 Inversion: 5/5  Leg strength  Quad: 5/5 Hamstring: 5/5 Hip flexor: 5/5 Hip abductors: 5/5  Gait unremarkable.  Impression and Recommendations:    Lumbar degenerative disc disease Did not have much response with a left L4-L5 interlaminar epidural like he did previously. Ultimately he started to have facet mediated pain so we set him up for left L3-S1 facet joint injections, on the day of the facet joint injections his left-sided back pain had resolved. He was then having severe right-sided back pain, on further questioning he had lifted a great deal cinderblocks all day. We are going to treat him aggressively with Cytomel drop, Toradol. He already has prednisone and I'm going to add Flexeril. He can do this for a couple of weeks and return to see me. We will defer his left and possibly right L3-S1 facet joint injections until a later date.  I spent 25 minutes with this patient, greater than 50% was face-to-face time counseling regarding the above diagnoses

## 2016-04-23 NOTE — Assessment & Plan Note (Signed)
Did not have much response with a left L4-L5 interlaminar epidural like he did previously. Ultimately he started to have facet mediated pain so we set him up for left L3-S1 facet joint injections, on the day of the facet joint injections his left-sided back pain had resolved. He was then having severe right-sided back pain, on further questioning he had lifted a great deal cinderblocks all day. We are going to treat him aggressively with Cytomel drop, Toradol. He already has prednisone and I'm going to add Flexeril. He can do this for a couple of weeks and return to see me. We will defer his left and possibly right L3-S1 facet joint injections until a later date.

## 2016-04-24 MED ORDER — METHYLPREDNISOLONE SODIUM SUCC 125 MG IJ SOLR
125.0000 mg | Freq: Once | INTRAMUSCULAR | Status: AC
  Administered 2016-04-24: 125 mg via INTRAMUSCULAR

## 2016-04-24 MED ORDER — KETOROLAC TROMETHAMINE 30 MG/ML IJ SOLN
30.0000 mg | Freq: Once | INTRAMUSCULAR | Status: AC
  Administered 2016-04-24: 30 mg via INTRAMUSCULAR

## 2016-04-24 NOTE — Addendum Note (Signed)
Addended by: Terance Hart on: 04/24/2016 02:25 PM   Modules accepted: Orders

## 2016-05-16 ENCOUNTER — Other Ambulatory Visit: Payer: Self-pay | Admitting: Sports Medicine

## 2016-05-18 ENCOUNTER — Other Ambulatory Visit: Payer: Self-pay | Admitting: Sports Medicine

## 2016-05-18 DIAGNOSIS — E669 Obesity, unspecified: Secondary | ICD-10-CM

## 2016-05-18 DIAGNOSIS — E1169 Type 2 diabetes mellitus with other specified complication: Secondary | ICD-10-CM

## 2016-06-06 ENCOUNTER — Other Ambulatory Visit: Payer: Self-pay | Admitting: Sports Medicine

## 2016-06-29 ENCOUNTER — Other Ambulatory Visit: Payer: Self-pay | Admitting: Sports Medicine

## 2016-07-31 ENCOUNTER — Telehealth: Payer: Self-pay | Admitting: *Deleted

## 2016-07-31 NOTE — Telephone Encounter (Signed)
trulicity has been approved through 05/11/2017. Pharmacy and patient notified

## 2016-07-31 NOTE — Telephone Encounter (Signed)
Pre Authorization sent to cover my meds. [id number]

## 2016-08-01 ENCOUNTER — Other Ambulatory Visit: Payer: Self-pay | Admitting: Sports Medicine

## 2016-08-28 ENCOUNTER — Other Ambulatory Visit: Payer: Self-pay | Admitting: Sports Medicine

## 2016-08-28 DIAGNOSIS — E669 Obesity, unspecified: Secondary | ICD-10-CM

## 2016-08-28 DIAGNOSIS — E1169 Type 2 diabetes mellitus with other specified complication: Secondary | ICD-10-CM

## 2016-09-05 ENCOUNTER — Other Ambulatory Visit: Payer: Self-pay | Admitting: Sports Medicine

## 2016-09-08 ENCOUNTER — Ambulatory Visit (INDEPENDENT_AMBULATORY_CARE_PROVIDER_SITE_OTHER): Payer: Medicare Other | Admitting: Physician Assistant

## 2016-09-08 ENCOUNTER — Ambulatory Visit (INDEPENDENT_AMBULATORY_CARE_PROVIDER_SITE_OTHER): Payer: Medicare Other

## 2016-09-08 VITALS — BP 124/90 | HR 102 | Wt 262.0 lb

## 2016-09-08 DIAGNOSIS — IMO0002 Reserved for concepts with insufficient information to code with codable children: Secondary | ICD-10-CM

## 2016-09-08 DIAGNOSIS — F458 Other somatoform disorders: Secondary | ICD-10-CM

## 2016-09-08 DIAGNOSIS — R0602 Shortness of breath: Secondary | ICD-10-CM | POA: Diagnosis not present

## 2016-09-08 DIAGNOSIS — J9811 Atelectasis: Secondary | ICD-10-CM | POA: Diagnosis not present

## 2016-09-08 DIAGNOSIS — E1165 Type 2 diabetes mellitus with hyperglycemia: Secondary | ICD-10-CM

## 2016-09-08 DIAGNOSIS — E1121 Type 2 diabetes mellitus with diabetic nephropathy: Secondary | ICD-10-CM

## 2016-09-08 DIAGNOSIS — R198 Other specified symptoms and signs involving the digestive system and abdomen: Secondary | ICD-10-CM

## 2016-09-08 DIAGNOSIS — R1310 Dysphagia, unspecified: Secondary | ICD-10-CM | POA: Diagnosis not present

## 2016-09-08 DIAGNOSIS — J441 Chronic obstructive pulmonary disease with (acute) exacerbation: Secondary | ICD-10-CM

## 2016-09-08 DIAGNOSIS — R0989 Other specified symptoms and signs involving the circulatory and respiratory systems: Secondary | ICD-10-CM | POA: Insufficient documentation

## 2016-09-08 DIAGNOSIS — R09A2 Foreign body sensation, throat: Secondary | ICD-10-CM

## 2016-09-08 DIAGNOSIS — R0600 Dyspnea, unspecified: Secondary | ICD-10-CM | POA: Insufficient documentation

## 2016-09-08 DIAGNOSIS — R5383 Other fatigue: Secondary | ICD-10-CM | POA: Diagnosis not present

## 2016-09-08 DIAGNOSIS — E119 Type 2 diabetes mellitus without complications: Secondary | ICD-10-CM | POA: Diagnosis not present

## 2016-09-08 DIAGNOSIS — R0609 Other forms of dyspnea: Secondary | ICD-10-CM | POA: Insufficient documentation

## 2016-09-08 MED ORDER — AZITHROMYCIN 250 MG PO TABS: ORAL_TABLET | ORAL | 0 refills | Status: DC

## 2016-09-08 MED ORDER — PREDNISONE 50 MG PO TABS: ORAL_TABLET | ORAL | 0 refills | Status: DC

## 2016-09-08 NOTE — Patient Instructions (Addendum)
-   Go downstairs for Chest X-ray and labs - Take antibiotic and steroid as prescribed for COPD exacerbations - Follow-up with Dr. Darene Lamer in 1 week or sooner as needed   Chronic Obstructive Pulmonary Disease Exacerbation Chronic obstructive pulmonary disease (COPD) is a common lung condition in which airflow from the lungs is limited. COPD is a general term that can be used to describe many different lung problems that limit airflow, including chronic bronchitis and emphysema. COPD exacerbations are episodes when breathing symptoms become much worse and require extra treatment. Without treatment, COPD exacerbations can be life threatening, and frequent COPD exacerbations can cause further damage to your lungs. What are the causes?  Respiratory infections.  Exposure to smoke.  Exposure to air pollution, chemical fumes, or dust. Sometimes there is no apparent cause or trigger. What increases the risk?  Smoking cigarettes.  Older age.  Frequent prior COPD exacerbations. What are the signs or symptoms?  Increased coughing.  Increased thick spit (sputum) production.  Increased wheezing.  Increased shortness of breath.  Rapid breathing.  Chest tightness. How is this diagnosed? Your medical history, a physical exam, and tests will help your health care provider make a diagnosis. Tests may include:  A chest X-ray.  Basic lab tests.  Sputum testing.  An arterial blood gas test. How is this treated? Depending on the severity of your COPD exacerbation, you may need to be admitted to a hospital for treatment. Some of the treatments commonly used to treat COPD exacerbations are:  Antibiotic medicines.  Bronchodilators. These are drugs that expand the air passages. They may be given with an inhaler or nebulizer. Spacer devices may be needed to help improve drug delivery.  Corticosteroid medicines.  Supplemental oxygen therapy.  Airway clearing techniques, such as noninvasive  ventilation (NIV) and positive expiratory pressure (PEP). These provide respiratory support through a mask or other noninvasive device. Follow these instructions at home:  Do not smoke. Quitting smoking is very important to prevent COPD from getting worse and exacerbations from happening as often.  Avoid exposure to all substances that irritate the airway, especially to tobacco smoke.  If you were prescribed an antibiotic medicine, finish it all even if you start to feel better.  Take all medicines as directed by your health care provider.It is important to use correct technique with inhaled medicines.  Drink enough fluids to keep your urine clear or pale yellow (unless you have a medical condition that requires fluid restriction).  Use a cool mist vaporizer. This makes it easier to clear your chest when you cough.  If you have a home nebulizer and oxygen, continue to use them as directed.  Maintain all necessary vaccinations to prevent infections.  Exercise regularly.  Eat a healthy diet.  Keep all follow-up appointments as directed by your health care provider. Get help right away if:  You have worsening shortness of breath.  You have trouble talking.  You have severe chest pain.  You have blood in your sputum.  You have a fever.  You have weakness, vomit repeatedly, or faint.  You feel confused.  You continue to get worse. This information is not intended to replace advice given to you by your health care provider. Make sure you discuss any questions you have with your health care provider. Document Released: 02/23/2007 Document Revised: 10/04/2015 Document Reviewed: 12/31/2012 Elsevier Interactive Patient Education  2017 [employer].

## 2016-09-08 NOTE — Progress Notes (Signed)
HPI:                                                                Clinton Ortiz is a 76 y.o. male who presents to [facility] today for worsening shortness of breath  Patient is accompanied by his daughter today.  Patient with PMH of COPD, left renal carcinoma, HTN, DM II, and R neck mass presents with worsening shortness of breath x 3 months. Patient states he is short of breath all of the time, even at rest. He reports today while feeding his deer he became weak and lightheaded. Denies syncope or fall. Patient endorses fatigue and daily productive cough. Denies fever, chills, hemoptysis. Denies orthopnea, PND, or peripheral edema. Daughter states he "rarely" needs Lasix. Patient states that his inhalers "don't do me any good." Daughter states she tries to check to see if he is using them.  Additionally, patient reports persistent "tightness in his throat" and difficulty swallowing. He states this has been going on for a few months. He denies throat pain. The tightness is midline. He does not notice if it is worse with solids or liquids.  His daughter is also concerned that Clinton Ortiz has been nauseous and has lost some weight due to poor appetite.   Past Medical History:  Diagnosis Date  . Cancer ([place])    left kidney  . Frequent urination   . Hernia   . Hyperlipidemia   . Hypertension   . Renal insufficiency   . Spondylolysis    Past Surgical History:  Procedure Laterality Date  . HERNIA REPAIR    . NEPHRECTOMY     left   Social History  Substance Use Topics  . Smoking status: Former Smoker    Packs/day: 1.00    Years: 2.00    Types: Cigarettes  . Smokeless tobacco: Not on file  . Alcohol use No   family history includes Cancer in his mother; Heart attack in his father.  ROS: negative except as noted in the HPI  Medications: Current Outpatient Prescriptions  Medication Sig Dispense Refill  . Multiple Vitamins-Minerals  (MULTIVITAMIN ADULTS 50+ PO) Take by mouth.    . Albuterol Sulfate (PROAIR RESPICLICK) 270 (90 BASE) MCG/ACT AEPB Inhale 2 puffs into the lungs every 4 (four) hours as needed. 1 each 0  . aspirin 325 MG EC tablet Take 325 mg by mouth daily.    Marland Kitchen azithromycin (ZITHROMAX Z-PAK) 250 MG tablet Take 2 tablets (500 mg) on  Day 1,  followed by 1 tablet (250 mg) once daily on Days 2 through 5. 6 tablet 0  . B Complex Vitamins (B COMPLEX PO) Take by mouth daily.    . cyclobenzaprine (FLEXERIL) 10 MG tablet One half tab PO qHS, then increase gradually to one tab TID. 30 tablet 0  . Dulaglutide (TRULICITY) 1.5 WC/3.7SE SOPN Inject 1.5 mg into the skin once a week. NEEDS APPOINTMENT FOR FUTURE REFILLS 4 pen 0  . Fish Oil-Krill Oil (KRILL OIL PLUS PO) Take by mouth daily.    . furosemide (LASIX) 40 MG tablet Take 40 mg by mouth as needed. Reported on 11/29/2015    . gabapentin (NEURONTIN) 100 MG capsule TAKE ONE CAPSULE TWICE A DAY 60 capsule 11  .  losartan (COZAAR) 100 MG tablet TAKE 1 TABLET (100 MG TOTAL) BY MOUTH DAILY. 90 tablet 3  . pravastatin (PRAVACHOL) 40 MG tablet TAKE 1 TABLET (40 MG TOTAL) BY MOUTH DAILY. 90 tablet 3  . predniSONE (DELTASONE) 50 MG tablet One tab PO daily for 5 days. 5 tablet 0  . Probiotic Product ([employer] ADULT 50+ PROBIOTIC PO) Take by mouth daily.    . tamsulosin (FLOMAX) 0.4 MG CAPS capsule TAKE 2 CAPSULES BY MOUTH EVERY DAY 180 capsule 0  . traMADol (ULTRAM) 50 MG tablet TAKE 1 TABLET EVERY 6 HOURS AS NEEDED FOR PAIN 30 tablet 0  . umeclidinium-vilanterol (ANORO ELLIPTA) 62.5-25 MCG/INH AEPB      No current facility-administered medications for this visit.    Allergies  Allergen Reactions  . Penicillins Nausea And Vomiting       Objective:  BP 124/90   Pulse (!) 102   Wt 262 lb (118.8 kg)   SpO2 92%   BMI 41.04 kg/m  Gen: well-groomed, cooperative, not ill-appearing, no distress HEENT: normal conjunctiva, oropharynx clear, moist mucus membranes, neck supple,  right submandibular mass Pulm: Normal work of breathing, normal phonation, trachea midline, clear to auscultation bilaterally, no wheezes, rales or rhonchi CV: Normal rate, regular rhythm, s1 and s2 distinct, no murmurs, clicks or rubs  GI: soft, nondistended, nontender Neuro: alert and oriented x 3, EOM's intact, PERRLA, normal tone, no tremor MSK: normal strength, moving all extremities, normal gait and station, no peripheral edema Lymph: no cervical or supraclavicular adenopathy Skin: warm and dry, no rashes or lesions on exposed skin, no cyanosis   No results found for this or any previous visit (from the past 72 hour(s)). Dg Chest 2 View  Result Date: 09/08/2016 CLINICAL DATA:  Shortness of breath. EXAM: CHEST  2 VIEW COMPARISON:  Chest radiograph 05/26/2014. FINDINGS: Stable cardiac and mediastinal contours with tortuosity of the thoracic aorta. Persistent lower lung heterogeneous opacities. No pleural effusion or pneumothorax. Thoracic spine degenerative changes. IMPRESSION: No acute cardiopulmonary process. Basilar atelectasis and or scarring. Electronically Signed   By: Lovey Newcomer M.D.   On: 09/08/2016 16:02        SIX MIN WALK 09/08/2016  Supplimental Oxygen during Test? (L/min) No  Baseline SPO2 93  SPO2 91  SPO2 91  Stopped or Paused before Six Minutes No     Assessment and Plan: 76 y.o. male with PMH of COPD, left renal carcinoma, HTN, DM II, and R neck mass and  Shortness of breath, COPD exacerbation - patient passed 6 minute walk, but SpO2 is 91-93% on RA - will treat as acute COPD exacerbation - question of compliance issue with daily inhalers - DG Chest 2 View - azithromycin (ZITHROMAX Z-PAK) 250 MG tablet; Take 2 tablets (500 mg) on  Day 1,  followed by 1 tablet (250 mg) once daily on Days 2 through 5.  Dispense: 6 tablet; Refill: 0 - predniSONE (DELTASONE) 50 MG tablet; One tab PO daily for 5 days.  Dispense: 5 tablet; Refill: 0 - close follow-up with PCP in  1 week  Fatigue, unspecified type - CBC with Differential/Platelet - COMPLETE METABOLIC PANEL WITH GFR - TSH - Hemoglobin A1c - Urinalysis - consider ECHO if labs normal, but patient is not showing signs/symptoms of heart failure  Globus sensation, Dysphagia - reviewed CT neck w/contrast report from 09/21/2014 which was negative for a mass - right submandibular mass could be causing compressive symptoms. Differential also includes GERD - close follow-up with PCP in  1 week  Patient education and anticipatory guidance given Patient agrees with treatment plan Follow-up with PCP in 1 week or sooner as needed if symptoms worsen or fail to improve  Darlyne Russian PA-C

## 2016-09-09 LAB — CBC WITH DIFFERENTIAL/PLATELET
BASOS ABS: 0 {cells}/uL (ref 0–200)
Basophils Relative: 0 %
EOS ABS: 222 {cells}/uL (ref 15–500)
Eosinophils Relative: 3 %
HEMATOCRIT: 46.4 % (ref 38.5–50.0)
Hemoglobin: 15.3 g/dL (ref 13.2–17.1)
LYMPHS ABS: 1776 {cells}/uL (ref 850–3900)
Lymphocytes Relative: 24 %
MCH: 32.1 pg (ref 27.0–33.0)
MCHC: 33 g/dL (ref 32.0–36.0)
MCV: 97.3 fL (ref 80.0–100.0)
MONO ABS: 666 {cells}/uL (ref 200–950)
MPV: 13.8 fL — ABNORMAL HIGH (ref 7.5–12.5)
Monocytes Relative: 9 %
NEUTROS ABS: 4736 {cells}/uL (ref 1500–7800)
Neutrophils Relative %: 64 %
PLATELETS: 173 10*3/uL (ref 140–400)
RBC: 4.77 MIL/uL (ref 4.20–5.80)
RDW: 12.9 % (ref 11.0–15.0)
WBC: 7.4 10*3/uL (ref 3.8–10.8)

## 2016-09-09 LAB — COMPREHENSIVE METABOLIC PANEL
AG RATIO: 1.4 ratio (ref 1.0–2.5)
ALK PHOS: 113 U/L (ref 40–115)
ALT: 38 U/L (ref 9–46)
AST: 34 U/L (ref 10–35)
Albumin: 3.9 g/dL (ref 3.6–5.1)
BILIRUBIN TOTAL: 0.7 mg/dL (ref 0.2–1.2)
BUN / CREAT RATIO: 12.9 ratio (ref 6–22)
BUN: 25 mg/dL (ref 7–25)
CO2: 22 mmol/L (ref 20–31)
Calcium: 9.4 mg/dL (ref 8.6–10.3)
Chloride: 102 mmol/L (ref 98–110)
Creat: 1.94 mg/dL — ABNORMAL HIGH (ref 0.70–1.18)
GFR, EST NON AFRICAN AMERICAN: 33 mL/min — AB (ref >=60)
GFR, Est African American: 38 mL/min — ABNORMAL LOW (ref >=60)
GLOBULIN: 2.7 g/dL (ref 1.9–3.7)
GLUCOSE: 340 mg/dL — AB (ref 65–99)
Potassium: 4.7 mmol/L (ref 3.5–5.3)
SODIUM: 135 mmol/L (ref 135–146)
TOTAL PROTEIN: 6.6 g/dL (ref 6.1–8.1)

## 2016-09-09 LAB — URINALYSIS
BILIRUBIN URINE: NEGATIVE
HGB URINE DIPSTICK: NEGATIVE
KETONES UR: NEGATIVE
Leukocytes, UA: NEGATIVE
Nitrite: NEGATIVE
PH: 5.5 (ref 5.0–8.0)
PROTEIN: NEGATIVE
Specific Gravity, Urine: 1.029 (ref 1.001–1.035)

## 2016-09-09 LAB — TSH: TSH: 3.72 m[IU]/L (ref 0.40–4.50)

## 2016-09-09 LAB — HEMOGLOBIN A1C
HEMOGLOBIN A1C: 12 % — AB (ref <5.7)
MEAN PLASMA GLUCOSE: 298 mg/dL

## 2016-09-09 NOTE — Progress Notes (Signed)
Patient found to have A1C of 12, hyperglycemia and glucosuria where diabetes was previously well-controlled on Trulicity.  Patient also showing signs of diabetic nephropathy with GFR of 38, Scr of 1.94 Lab Results  Component Value Date   CREATININE 1.94 (H) 09/08/2016   Patient notified and recommended he follow-up with PCP to adjust his medications as soon as possible.

## 2016-09-10 ENCOUNTER — Telehealth: Payer: Self-pay

## 2016-09-10 NOTE — Telephone Encounter (Signed)
Pt's daughter called and said that Clinton Ortiz has been having diarrhea since 4 am yesterday.  They think that it is from the antibiotic.  Please advise.

## 2016-09-10 NOTE — Telephone Encounter (Signed)
Discontinue the Azithromycin. Make sure he is drinking plenty of clear liquids. Recommend Culturelle probiotics over-the-counter to restore normal gut flora. He also needs a follow-up appointment with Dr. Darene Lamer for his uncontrolled diabetes if he does not have one already

## 2016-09-10 NOTE — Telephone Encounter (Signed)
Notified patient, verbalized understanding.

## 2016-09-12 DIAGNOSIS — Z7982 Long term (current) use of aspirin: Secondary | ICD-10-CM | POA: Diagnosis not present

## 2016-09-12 DIAGNOSIS — Z888 Allergy status to other drugs, medicaments and biological substances status: Secondary | ICD-10-CM | POA: Diagnosis not present

## 2016-09-12 DIAGNOSIS — E1165 Type 2 diabetes mellitus with hyperglycemia: Secondary | ICD-10-CM | POA: Diagnosis not present

## 2016-09-12 DIAGNOSIS — R5383 Other fatigue: Secondary | ICD-10-CM | POA: Diagnosis not present

## 2016-09-12 DIAGNOSIS — R531 Weakness: Secondary | ICD-10-CM | POA: Diagnosis not present

## 2016-09-12 DIAGNOSIS — Z87891 Personal history of nicotine dependence: Secondary | ICD-10-CM | POA: Diagnosis not present

## 2016-09-12 DIAGNOSIS — E785 Hyperlipidemia, unspecified: Secondary | ICD-10-CM | POA: Diagnosis not present

## 2016-09-12 DIAGNOSIS — G319 Degenerative disease of nervous system, unspecified: Secondary | ICD-10-CM | POA: Diagnosis not present

## 2016-09-12 DIAGNOSIS — Z7951 Long term (current) use of inhaled steroids: Secondary | ICD-10-CM | POA: Diagnosis not present

## 2016-09-12 DIAGNOSIS — R110 Nausea: Secondary | ICD-10-CM | POA: Diagnosis not present

## 2016-09-12 DIAGNOSIS — J9811 Atelectasis: Secondary | ICD-10-CM | POA: Diagnosis not present

## 2016-09-12 DIAGNOSIS — Z91010 Allergy to peanuts: Secondary | ICD-10-CM | POA: Diagnosis not present

## 2016-09-12 DIAGNOSIS — Z79899 Other long term (current) drug therapy: Secondary | ICD-10-CM | POA: Diagnosis not present

## 2016-09-12 DIAGNOSIS — R600 Localized edema: Secondary | ICD-10-CM | POA: Diagnosis not present

## 2016-09-12 DIAGNOSIS — Z880 Allergy status to penicillin: Secondary | ICD-10-CM | POA: Diagnosis not present

## 2016-09-12 DIAGNOSIS — R29818 Other symptoms and signs involving the nervous system: Secondary | ICD-10-CM | POA: Diagnosis not present

## 2016-09-12 DIAGNOSIS — I10 Essential (primary) hypertension: Secondary | ICD-10-CM | POA: Diagnosis not present

## 2016-09-12 DIAGNOSIS — J449 Chronic obstructive pulmonary disease, unspecified: Secondary | ICD-10-CM | POA: Diagnosis not present

## 2016-09-16 ENCOUNTER — Ambulatory Visit (INDEPENDENT_AMBULATORY_CARE_PROVIDER_SITE_OTHER): Payer: Medicare Other | Admitting: Sports Medicine

## 2016-09-16 VITALS — BP 105/66 | HR 117 | Resp 18 | Wt 256.5 lb

## 2016-09-16 DIAGNOSIS — R110 Nausea: Secondary | ICD-10-CM | POA: Diagnosis not present

## 2016-09-16 DIAGNOSIS — IMO0002 Reserved for concepts with insufficient information to code with codable children: Secondary | ICD-10-CM

## 2016-09-16 DIAGNOSIS — E1165 Type 2 diabetes mellitus with hyperglycemia: Secondary | ICD-10-CM

## 2016-09-16 DIAGNOSIS — E1121 Type 2 diabetes mellitus with diabetic nephropathy: Secondary | ICD-10-CM

## 2016-09-16 LAB — GLUCOSE, POCT (MANUAL RESULT ENTRY): POC Glucose: 360 mg/dl — AB (ref 70–99)

## 2016-09-16 MED ORDER — PROMETHAZINE HCL 25 MG/ML IJ SOLN
25.0000 mg | Freq: Once | INTRAMUSCULAR | Status: AC
  Administered 2016-09-16: 25 mg via INTRAMUSCULAR

## 2016-09-16 MED ORDER — CANAGLIFLOZIN-METFORMIN HCL ER 150-1000 MG PO TB24: 1.0000 | ORAL_TABLET | Freq: Every day | ORAL | 11 refills | Status: DC

## 2016-09-16 MED ORDER — GLIPIZIDE 10 MG PO TABS: 10.0000 mg | ORAL_TABLET | Freq: Two times a day (BID) | ORAL | 3 refills | Status: DC

## 2016-09-16 MED ORDER — METOCLOPRAMIDE HCL 10 MG PO TABS: 10.0000 mg | ORAL_TABLET | Freq: Three times a day (TID) | ORAL | 3 refills | Status: DC

## 2016-09-16 MED ORDER — ONDANSETRON 4 MG PO TBDP
8.0000 mg | ORAL_TABLET | Freq: Once | ORAL | Status: AC
  Administered 2016-09-16: 8 mg via ORAL

## 2016-09-16 MED ORDER — ASPIRIN EC 81 MG PO TBEC: 81.0000 mg | DELAYED_RELEASE_TABLET | Freq: Every day | ORAL | 3 refills | Status: DC

## 2016-09-16 MED ORDER — ONDANSETRON 8 MG PO TBDP: 8.0000 mg | ORAL_TABLET | Freq: Three times a day (TID) | ORAL | 11 refills | Status: DC | PRN

## 2016-09-16 NOTE — Assessment & Plan Note (Addendum)
Extremely high blood sugar, he is developing some delirium at night. I also think he has some degree of diabetic gastroparesis. Phenergan and Zofran here in the office today. He will take Reglan and Zofran around-the-clock at home. Adding high-dose glipizide and Invokamet. Rechecking blood work. They will keep track of morning blood sugars every day and return to see me next week. If his blood sugars normalize and he still has some degree of altered mental status we will proceed with brain MRI.

## 2016-09-16 NOTE — Progress Notes (Signed)
  Subjective:    CC: Feeling sick  HPI: For the past week Clinton Ortiz has felt nauseated, has not had an appetite. At night he becomes somewhat delirious per his daughter, he was seen in the return to department were blood sugar was over 300, given some insulin and discharged. Other blood work was for the most part negative with the exception of some glycosuria and a slight bump in his creatinine.   Past medical history:  Negative.  See flowsheet/record as well for more information.  Surgical history: Negative.  See flowsheet/record as well for more information.  Family history: Negative.  See flowsheet/record as well for more information.  Social history: Negative.  See flowsheet/record as well for more information.  Allergies, and medications have been entered into the medical record, reviewed, and no changes needed.   Review of Systems: No fevers, chills, night sweats, weight loss, chest pain, or shortness of breath.   Objective:    General: Well Developed, well nourished, and in no acute distress.  Neuro: Alert and oriented x3, extra-ocular muscles intact, sensation grossly intact.  HEENT: Normocephalic, atraumatic, pupils equal round reactive to light, neck supple, no masses, no lymphadenopathy, thyroid nonpalpable.  Skin: Warm and dry, no rashes. Cardiac: Regular rate and rhythm, no murmurs rubs or gallops, no lower extremity edema.  Respiratory: Clear to auscultation bilaterally. Not using accessory muscles, speaking in full sentences. Abdomen: Soft, nontender, nondistended, no bowel sounds, no palpable masses, guarding, rigidity, rebound tenderness.  Impression and Recommendations:    Uncontrolled type 2 diabetes mellitus with diabetic nephropathy, without long-term current use of insulin (HCC) Extremely high blood sugar, he is developing some delirium at night. I also think he has some degree of diabetic gastroparesis. Phenergan and Zofran here in the office today. He will take  Reglan and Zofran around-the-clock at home. Adding high-dose glipizide and Invokamet. Rechecking blood work. They will keep track of morning blood sugars every day and return to see me next week. If his blood sugars normalize and he still has some degree of altered mental status we will proceed with brain MRI.

## 2016-09-17 LAB — COMPREHENSIVE METABOLIC PANEL WITH GFR
ALT: 48 U/L — ABNORMAL HIGH (ref 9–46)
AST: 44 U/L — ABNORMAL HIGH (ref 10–35)
Alkaline Phosphatase: 96 U/L (ref 40–115)
BUN: 22 mg/dL (ref 7–25)
Calcium: 9 mg/dL (ref 8.6–10.3)
Glucose, Bld: 335 mg/dL — ABNORMAL HIGH (ref 65–99)
Total Bilirubin: 0.7 mg/dL (ref 0.2–1.2)

## 2016-09-17 LAB — CBC
HCT: 48.4 % (ref 38.5–50.0)
Hemoglobin: 16 g/dL (ref 13.2–17.1)
MCH: 32.3 pg (ref 27.0–33.0)
MCHC: 33.1 g/dL (ref 32.0–36.0)
MCV: 97.6 fL (ref 80.0–100.0)
MPV: 13.7 fL — ABNORMAL HIGH (ref 7.5–12.5)
Platelets: 178 K/uL (ref 140–400)
RBC: 4.96 MIL/uL (ref 4.20–5.80)
RDW: 13 % (ref 11.0–15.0)
WBC: 9.1 K/uL (ref 3.8–10.8)

## 2016-09-17 LAB — COMPREHENSIVE METABOLIC PANEL
Albumin: 3.8 g/dL (ref 3.6–5.1)
CO2: 21 mmol/L (ref 20–31)
Chloride: 103 mmol/L (ref 98–110)
Creat: 1.91 mg/dL — ABNORMAL HIGH (ref 0.70–1.18)
Potassium: 4.8 mmol/L (ref 3.5–5.3)
Sodium: 135 mmol/L (ref 135–146)
Total Protein: 6.1 g/dL (ref 6.1–8.1)

## 2016-09-17 LAB — D-DIMER, QUANTITATIVE: D-Dimer, Quant: 0.45 mcg/mL FEU (ref <0.50)

## 2016-09-22 ENCOUNTER — Telehealth: Payer: Self-pay | Admitting: *Deleted

## 2016-09-22 NOTE — Telephone Encounter (Signed)
Pre Authorization sent to cover my meds. [id number]

## 2016-09-23 ENCOUNTER — Ambulatory Visit (INDEPENDENT_AMBULATORY_CARE_PROVIDER_SITE_OTHER): Payer: Medicare Other | Admitting: Sports Medicine

## 2016-09-23 DIAGNOSIS — E1121 Type 2 diabetes mellitus with diabetic nephropathy: Secondary | ICD-10-CM | POA: Diagnosis not present

## 2016-09-23 DIAGNOSIS — E1165 Type 2 diabetes mellitus with hyperglycemia: Secondary | ICD-10-CM

## 2016-09-23 DIAGNOSIS — IMO0002 Reserved for concepts with insufficient information to code with codable children: Secondary | ICD-10-CM

## 2016-09-23 MED ORDER — METFORMIN HCL 1000 MG PO TABS: 1000.0000 mg | ORAL_TABLET | Freq: Two times a day (BID) | ORAL | 3 refills | Status: DC

## 2016-09-23 NOTE — Assessment & Plan Note (Signed)
Doing fantastic, blood sugars have improved considerably, his nausea diabetic gastroparesis has resolved with Reglan. He was unable to get the Invokamet, he will continue with Trulicity, glipizide max dose and I am going to simply switch him to generic metformin twice a day. Blood sugars have been running between 150 and 250. We will do 3 months on current regimen and then check an A1c.

## 2016-09-23 NOTE — Progress Notes (Signed)
  Subjective:    CC: Follow-up  HPI: I saw this pleasant 76 year old male last week with severe nausea, vomiting, his blood sugars were elevated into the 300s to 400s, he really didn't change his regimen of Trulicity. We hydrated him, adding Reglan and added high-dose glipizide, I also added Invokamet, he was unable to get this covered.  He returns today with symptoms completely resolved, home blood sugars are typically in the 150s fasting and in the 250s postprandial in the evening.  Overall he feels well.  Past medical history:  Negative.  See flowsheet/record as well for more information.  Surgical history: Negative.  See flowsheet/record as well for more information.  Family history: Negative.  See flowsheet/record as well for more information.  Social history: Negative.  See flowsheet/record as well for more information.  Allergies, and medications have been entered into the medical record, reviewed, and no changes needed.   Review of Systems: No fevers, chills, night sweats, weight loss, chest pain, or shortness of breath.   Objective:    General: Well Developed, well nourished, and in no acute distress.  Neuro: Alert and oriented x3, extra-ocular muscles intact, sensation grossly intact.  HEENT: Normocephalic, atraumatic, pupils equal round reactive to light, neck supple, no masses, no lymphadenopathy, thyroid nonpalpable.  Skin: Warm and dry, no rashes. Cardiac: Regular rate and rhythm, no murmurs rubs or gallops, no lower extremity edema.  Respiratory: Clear to auscultation bilaterally. Not using accessory muscles, speaking in full sentences.  Impression and Recommendations:    Uncontrolled type 2 diabetes mellitus with diabetic nephropathy, without long-term current use of insulin (HCC) Doing fantastic, blood sugars have improved considerably, his nausea diabetic gastroparesis has resolved with Reglan. He was unable to get the Invokamet, he will continue with Trulicity,  glipizide max dose and I am going to simply switch him to generic metformin twice a day. Blood sugars have been running between 150 and 250. We will do 3 months on current regimen and then check an A1c.  I spent 25 minutes with this patient, greater than 50% was face-to-face time counseling regarding the above diagnoses

## 2016-09-25 NOTE — Telephone Encounter (Signed)
See office visit 09/23/16. Closing encounter.

## 2016-10-16 DIAGNOSIS — C642 Malignant neoplasm of left kidney, except renal pelvis: Secondary | ICD-10-CM | POA: Diagnosis not present

## 2016-10-16 DIAGNOSIS — N200 Calculus of kidney: Secondary | ICD-10-CM | POA: Diagnosis not present

## 2016-10-16 DIAGNOSIS — Z85528 Personal history of other malignant neoplasm of kidney: Secondary | ICD-10-CM | POA: Diagnosis not present

## 2016-11-10 ENCOUNTER — Other Ambulatory Visit: Payer: Self-pay | Admitting: Sports Medicine

## 2016-11-10 DIAGNOSIS — E1169 Type 2 diabetes mellitus with other specified complication: Secondary | ICD-10-CM

## 2016-11-10 DIAGNOSIS — E669 Obesity, unspecified: Secondary | ICD-10-CM

## 2016-12-01 ENCOUNTER — Telehealth: Payer: Self-pay

## 2016-12-01 DIAGNOSIS — E1169 Type 2 diabetes mellitus with other specified complication: Secondary | ICD-10-CM

## 2016-12-01 DIAGNOSIS — E669 Obesity, unspecified: Secondary | ICD-10-CM

## 2016-12-01 NOTE — Telephone Encounter (Deleted)
Daughter stated that he has been taking 2 gabapentin since his last visit. She is running and is wanting a new Rx sent to his [facility]. Is this request appropriate? -EH/RMA

## 2016-12-02 MED ORDER — GABAPENTIN 100 MG PO CAPS: 100.0000 mg | ORAL_CAPSULE | Freq: Two times a day (BID) | ORAL | 1 refills | Status: DC

## 2016-12-02 NOTE — Telephone Encounter (Signed)
Gabapentin refill sent to [place]

## 2016-12-04 ENCOUNTER — Other Ambulatory Visit: Payer: Self-pay | Admitting: Sports Medicine

## 2016-12-09 DIAGNOSIS — C029 Malignant neoplasm of tongue, unspecified: Secondary | ICD-10-CM | POA: Diagnosis not present

## 2016-12-09 DIAGNOSIS — Z08 Encounter for follow-up examination after completed treatment for malignant neoplasm: Secondary | ICD-10-CM | POA: Diagnosis not present

## 2016-12-09 DIAGNOSIS — Z85810 Personal history of malignant neoplasm of tongue: Secondary | ICD-10-CM | POA: Diagnosis not present

## 2016-12-09 DIAGNOSIS — Z9049 Acquired absence of other specified parts of digestive tract: Secondary | ICD-10-CM | POA: Diagnosis not present

## 2016-12-24 ENCOUNTER — Other Ambulatory Visit: Payer: Self-pay | Admitting: Sports Medicine

## 2016-12-24 DIAGNOSIS — E1165 Type 2 diabetes mellitus with hyperglycemia: Principal | ICD-10-CM

## 2016-12-24 DIAGNOSIS — IMO0002 Reserved for concepts with insufficient information to code with codable children: Secondary | ICD-10-CM

## 2016-12-24 DIAGNOSIS — E1121 Type 2 diabetes mellitus with diabetic nephropathy: Secondary | ICD-10-CM

## 2016-12-25 ENCOUNTER — Ambulatory Visit (INDEPENDENT_AMBULATORY_CARE_PROVIDER_SITE_OTHER): Payer: Medicare Other | Admitting: Sports Medicine

## 2016-12-25 VITALS — BP 104/70 | HR 96 | Resp 16 | Wt 261.5 lb

## 2016-12-25 DIAGNOSIS — E1121 Type 2 diabetes mellitus with diabetic nephropathy: Secondary | ICD-10-CM | POA: Diagnosis not present

## 2016-12-25 DIAGNOSIS — I10 Essential (primary) hypertension: Secondary | ICD-10-CM

## 2016-12-25 LAB — POCT GLYCOSYLATED HEMOGLOBIN (HGB A1C): Hemoglobin A1C: 6.7

## 2016-12-25 NOTE — Progress Notes (Signed)
  Subjective:    CC: Follow-up  HPI: Diabetes mellitus type 2: Was previously uncontrolled, more recently he has been doing very well with morning blood sugars just over 100, he is currently untreated Trulicity, max dose glipizide and max dose metformin. Overall doing well. No low blood sugars.  Past medical history:  Negative.  See flowsheet/record as well for more information.  Surgical history: Negative.  See flowsheet/record as well for more information.  Family history: Negative.  See flowsheet/record as well for more information.  Social history: Negative.  See flowsheet/record as well for more information.  Allergies, and medications have been entered into the medical record, reviewed, and no changes needed.   Review of Systems: No fevers, chills, night sweats, weight loss, chest pain, or shortness of breath.   Objective:    General: Well Developed, well nourished, and in no acute distress.  Neuro: Alert and oriented x3, extra-ocular muscles intact, sensation grossly intact.  HEENT: Normocephalic, atraumatic, pupils equal round reactive to light, neck supple, no masses, no lymphadenopathy, thyroid nonpalpable.  Skin: Warm and dry, no rashes. Cardiac: Regular rate and rhythm, no murmurs rubs or gallops, no lower extremity edema.  Respiratory: Clear to auscultation bilaterally. Not using accessory muscles, speaking in full sentences.  Diabetic Foot Exam Both feet were examined, there are no signs of ulceration or abnormal callus. Nails are unremarkable. Dorsalis pedis and posterior tibial pulses are palpable. Sensation is intact to sharp and monofilament. Shoes are of appropriate fitment.  Impression and Recommendations:    Controlled type 2 diabetes mellitus with diabetic nephropathy (HCC) Hemoglobin A1c is 6.7%. Doing well on Trulicity, glipizide max dose, generic metformin twice a day. Foot exam done today. Return in 6 months for this.  Hypertension Well-controlled, no  changes.  I spent 25 minutes with this patient, greater than 50% was face-to-face time counseling regarding the above diagnoses

## 2016-12-25 NOTE — Assessment & Plan Note (Signed)
Hemoglobin A1c is 6.7%. Doing well on Trulicity, glipizide max dose, generic metformin twice a day. Foot exam done today. Return in 6 months for this.

## 2016-12-25 NOTE — Assessment & Plan Note (Signed)
Well controlled, no changes 

## 2017-01-02 ENCOUNTER — Other Ambulatory Visit: Payer: Self-pay | Admitting: Sports Medicine

## 2017-01-02 DIAGNOSIS — J410 Simple chronic bronchitis: Secondary | ICD-10-CM

## 2017-02-04 ENCOUNTER — Other Ambulatory Visit: Payer: Self-pay | Admitting: Sports Medicine

## 2017-02-11 ENCOUNTER — Other Ambulatory Visit: Payer: Self-pay

## 2017-02-11 DIAGNOSIS — E1169 Type 2 diabetes mellitus with other specified complication: Secondary | ICD-10-CM

## 2017-02-11 DIAGNOSIS — E669 Obesity, unspecified: Secondary | ICD-10-CM

## 2017-02-11 MED ORDER — GABAPENTIN 100 MG PO CAPS: 100.0000 mg | ORAL_CAPSULE | Freq: Three times a day (TID) | ORAL | 1 refills | Status: DC

## 2017-02-14 ENCOUNTER — Other Ambulatory Visit: Payer: Self-pay | Admitting: Sports Medicine

## 2017-02-14 DIAGNOSIS — E1121 Type 2 diabetes mellitus with diabetic nephropathy: Secondary | ICD-10-CM

## 2017-02-14 DIAGNOSIS — IMO0002 Reserved for concepts with insufficient information to code with codable children: Secondary | ICD-10-CM

## 2017-02-14 DIAGNOSIS — E1165 Type 2 diabetes mellitus with hyperglycemia: Principal | ICD-10-CM

## 2017-02-25 DIAGNOSIS — N183 Chronic kidney disease, stage 3 (moderate): Secondary | ICD-10-CM | POA: Diagnosis not present

## 2017-03-05 ENCOUNTER — Other Ambulatory Visit: Payer: Self-pay | Admitting: Sports Medicine

## 2017-04-20 ENCOUNTER — Other Ambulatory Visit: Payer: Self-pay | Admitting: Sports Medicine

## 2017-04-20 DIAGNOSIS — E1165 Type 2 diabetes mellitus with hyperglycemia: Principal | ICD-10-CM

## 2017-04-20 DIAGNOSIS — E1121 Type 2 diabetes mellitus with diabetic nephropathy: Secondary | ICD-10-CM

## 2017-04-20 DIAGNOSIS — IMO0002 Reserved for concepts with insufficient information to code with codable children: Secondary | ICD-10-CM

## 2017-04-27 ENCOUNTER — Telehealth: Payer: Self-pay

## 2017-04-27 DIAGNOSIS — J410 Simple chronic bronchitis: Secondary | ICD-10-CM

## 2017-04-27 NOTE — Telephone Encounter (Signed)
Pt daughter left VM to ask for samples of inhaler (Anoro)  or a change in inhaler that will be more affordable. Please advise.

## 2017-04-28 MED ORDER — UMECLIDINIUM-VILANTEROL 62.5-25 MCG/INH IN AEPB: 1.0000 | INHALATION_SPRAY | Freq: Every day | RESPIRATORY_TRACT | 3 refills | Status: DC

## 2017-04-28 NOTE — Telephone Encounter (Signed)
We don't have any samples but I am going to write a prescription and attached to a 1 month free discount coupon that should get him out of the donut hole.  It will be in my box.

## 2017-04-30 DIAGNOSIS — Z23 Encounter for immunization: Secondary | ICD-10-CM | POA: Diagnosis not present

## 2017-04-30 NOTE — Telephone Encounter (Signed)
Spoke with daughter states pt is medicare so he can't use the coupon. Would like to know if medication can be changed.

## 2017-05-01 ENCOUNTER — Other Ambulatory Visit: Payer: Self-pay

## 2017-05-01 DIAGNOSIS — J410 Simple chronic bronchitis: Secondary | ICD-10-CM

## 2017-05-01 MED ORDER — UMECLIDINIUM-VILANTEROL 62.5-25 MCG/INH IN AEPB: 1.0000 | INHALATION_SPRAY | Freq: Every day | RESPIRATORY_TRACT | 3 refills | Status: DC

## 2017-05-01 NOTE — Telephone Encounter (Signed)
Left VM with information.  

## 2017-05-01 NOTE — Telephone Encounter (Signed)
The discount coupons cannot be used but the 1 month free voucher should be okay with Medicare.

## 2017-05-06 ENCOUNTER — Ambulatory Visit (INDEPENDENT_AMBULATORY_CARE_PROVIDER_SITE_OTHER): Payer: Medicare Other | Admitting: Physician Assistant

## 2017-05-06 ENCOUNTER — Encounter: Payer: Self-pay | Admitting: Physician Assistant

## 2017-05-06 ENCOUNTER — Ambulatory Visit (INDEPENDENT_AMBULATORY_CARE_PROVIDER_SITE_OTHER): Payer: Medicare Other

## 2017-05-06 VITALS — BP 128/76 | HR 82 | Temp 98.5°F | Resp 16 | Wt 267.3 lb

## 2017-05-06 DIAGNOSIS — R042 Hemoptysis: Secondary | ICD-10-CM

## 2017-05-06 DIAGNOSIS — J440 Chronic obstructive pulmonary disease with acute lower respiratory infection: Secondary | ICD-10-CM | POA: Diagnosis not present

## 2017-05-06 DIAGNOSIS — R05 Cough: Secondary | ICD-10-CM | POA: Diagnosis not present

## 2017-05-06 MED ORDER — AZITHROMYCIN 250 MG PO TABS: ORAL_TABLET | ORAL | 0 refills | Status: DC

## 2017-05-06 MED ORDER — PREDNISONE 50 MG PO TABS: ORAL_TABLET | ORAL | 0 refills | Status: DC

## 2017-05-06 NOTE — Patient Instructions (Signed)

## 2017-05-06 NOTE — Progress Notes (Signed)
HPI:                                                                Clinton Ortiz is a 76 y.o. male who presents to [facility] [place] today for cough  Cough  This is a new problem. The current episode started in the past 7 days. The problem has been unchanged. The problem occurs every few minutes. The cough is productive of blood-tinged sputum and productive of purulent sputum. Associated symptoms include nasal congestion, a sore throat, sweats and wheezing. Pertinent negatives include no fever, headaches or rash. The symptoms are aggravated by lying down. Risk factors for lung disease include smoking/tobacco exposure. He has tried OTC cough suppressant (acetaminophen) for the symptoms. The treatment provided no relief.    Past Medical History:  Diagnosis Date  . Cancer ([place])    left kidney  . Frequent urination   . Hernia   . Hyperlipidemia   . Hypertension   . Renal insufficiency   . Spondylolysis    Past Surgical History:  Procedure Laterality Date  . HERNIA REPAIR    . NEPHRECTOMY     left   Social History   Tobacco Use  . Smoking status: Former Smoker    Packs/day: 1.00    Years: 2.00    Pack years: 2.00    Types: Cigarettes  . Smokeless tobacco: Never Used  Substance Use Topics  . Alcohol use: No   family history includes Cancer in his mother; Heart attack in his father.  ROS: negative except as noted in the HPI  Medications: Current Outpatient Medications  Medication Sig Dispense Refill  . acetaminophen (TYLENOL) 325 MG tablet Take 650 mg by mouth every 6 (six) hours as needed.    . Albuterol Sulfate (PROAIR RESPICLICK) 237 (90 BASE) MCG/ACT AEPB Inhale 2 puffs into the lungs every 4 (four) hours as needed. 1 each 0  . aspirin 325 MG EC tablet Take 325 mg by mouth daily.    . B Complex Vitamins (B COMPLEX PO) Take by mouth daily.    . cyclobenzaprine (FLEXERIL) 10 MG tablet One half tab PO qHS, then increase gradually to  one tab TID. 30 tablet 0  . Fish Oil-Krill Oil (KRILL OIL PLUS PO) Take by mouth daily.    . furosemide (LASIX) 40 MG tablet Take 40 mg by mouth as needed. Reported on 11/29/2015    . gabapentin (NEURONTIN) 100 MG capsule Take 1 capsule (100 mg total) by mouth 3 (three) times daily. 270 capsule 1  . glipiZIDE (GLUCOTROL) 10 MG tablet TAKE 1 TABLET (10 MG TOTAL) BY MOUTH 2 (TWO) TIMES DAILY BEFORE A MEAL. 60 tablet 3  . losartan (COZAAR) 100 MG tablet TAKE 1 TABLET (100 MG TOTAL) BY MOUTH DAILY. 90 tablet 3  . metFORMIN (GLUCOPHAGE) 1000 MG tablet Take 1 tablet (1,000 mg total) by mouth 2 (two) times daily with a meal. 180 tablet 3  . metoCLOPramide (REGLAN) 10 MG tablet TAKE 1 TABLET (10 MG TOTAL) BY MOUTH 3 (THREE) TIMES DAILY BEFORE MEALS. 90 tablet 3  . Multiple Vitamins-Minerals (MULTIVITAMIN ADULTS 50+ PO) Take by mouth.    . ondansetron (ZOFRAN-ODT) 8 MG disintegrating tablet Take 1 tablet (8 mg total) by mouth  every 8 (eight) hours as needed for nausea. 20 tablet 11  . pravastatin (PRAVACHOL) 40 MG tablet TAKE 1 TABLET (40 MG TOTAL) BY MOUTH DAILY. 90 tablet 3  . Probiotic Product ([facility] ADULT 50+ PROBIOTIC PO) Take by mouth daily.    . tamsulosin (FLOMAX) 0.4 MG CAPS capsule TAKE 2 CAPSULES BY MOUTH EVERY DAY 180 capsule 0  . traMADol (ULTRAM) 50 MG tablet TAKE 1 TABLET EVERY 6 HOURS AS NEEDED FOR PAIN 30 tablet 0  . TRULICITY 1.5 MG/8.6PY SOPN INJECT 1.5 MG INTO THE SKIN ONCE A WEEK. NEEDS APPOINTMENT FOR FUTURE REFILLS 4 pen 11  . umeclidinium-vilanterol (ANORO ELLIPTA) 62.5-25 MCG/INH AEPB Inhale 1 puff into the lungs daily. 60 each 3  . azithromycin (ZITHROMAX Z-PAK) 250 MG tablet Take 2 tablets (500 mg) on  Day 1,  followed by 1 tablet (250 mg) once daily on Days 2 through 5. 6 tablet 0  . predniSONE (DELTASONE) 50 MG tablet One tab PO daily for 5 days. 5 tablet 0   No current facility-administered medications for this visit.    Allergies  Allergen Reactions  . Penicillins Nausea And  Vomiting       Objective:  BP 128/76   Pulse 82   Temp 98.5 F (36.9 C) (Oral)   Resp 16   Wt 267 lb 4.8 oz (121.2 kg)   SpO2 96%   BMI 41.87 kg/m  Gen:  alert, not ill-appearing, no distress, appropriate for age, obese male HEENT: head normocephalic without obvious abnormality, conjunctiva and cornea clear, wearing glasses, TM's clear bilaterally, oropharynx clear, uvula midline, neck supple, no adenopathy, trachea midline Pulm: Normal work of breathing, normal phonation, breath sounds mildly diminished, clear to auscultation bilaterally, no wheezes, rales or rhonchi CV: Normal rate, regular rhythm, s1 and s2 distinct, heart sounds are distant, no murmurs, clicks or rubs  Neuro: alert and oriented x 3, no tremor MSK: extremities atraumatic, normal gait and station Skin: intact, no rashes on exposed skin, no jaundice, no cyanosis  No flowsheet data found.   No results found for this or any previous visit (from the past 72 hour(s)). No results found.    Assessment and Plan: 76 y.o. male with   1. Chronic obstructive pulmonary disease with acute lower respiratory infection (HCC) - SpO2 96% on RA at rest, no evidence of respiratory distress, no adventitious lung sounds - DG Chest 2 View to assess for infiltrate given history of blood-tinged sputum - azithromycin (ZITHROMAX Z-PAK) 250 MG tablet; Take 2 tablets (500 mg) on  Day 1,  followed by 1 tablet (250 mg) once daily on Days 2 through 5.  Dispense: 6 tablet; Refill: 0 - predniSONE (DELTASONE) 50 MG tablet; One tab PO daily for 5 days.  Dispense: 5 tablet; Refill: 0  2. Blood-tinged sputum - DG Chest 2 View   Patient education and anticipatory guidance given Patient agrees with treatment plan Follow-up with PCP in 2 weeks or sooner as needed if symptoms worsen or fail to improve  Darlyne Russian PA-C

## 2017-05-06 NOTE — Progress Notes (Signed)
Jaimeson,  Good news - your chest x-ray did not show any pneumonia. COPD changes and fibrosis of the lungs are chronic and unchanged from prior chest x-rays. Treatment plan does not change.   Best, Evlyn Clines

## 2017-05-11 ENCOUNTER — Telehealth: Payer: Self-pay | Admitting: *Deleted

## 2017-05-11 NOTE — Telephone Encounter (Signed)
Pre Authorization sent to cover my meds. [id number]

## 2017-05-11 NOTE — Telephone Encounter (Signed)
trulicity has been approved through insurance through 05/11/2018. Pharmacy notified

## 2017-05-16 ENCOUNTER — Encounter: Payer: Self-pay | Admitting: Physician Assistant

## 2017-05-22 ENCOUNTER — Ambulatory Visit (INDEPENDENT_AMBULATORY_CARE_PROVIDER_SITE_OTHER): Payer: Medicare Other | Admitting: Sports Medicine

## 2017-05-22 DIAGNOSIS — E669 Obesity, unspecified: Secondary | ICD-10-CM | POA: Diagnosis not present

## 2017-05-22 DIAGNOSIS — J440 Chronic obstructive pulmonary disease with acute lower respiratory infection: Secondary | ICD-10-CM

## 2017-05-22 DIAGNOSIS — E1169 Type 2 diabetes mellitus with other specified complication: Secondary | ICD-10-CM | POA: Diagnosis not present

## 2017-05-22 MED ORDER — DULAGLUTIDE 1.5 MG/0.5ML ~~LOC~~ SOAJ: 1.5000 mg | SUBCUTANEOUS | 11 refills | Status: DC

## 2017-05-22 MED ORDER — HYDROCODONE-HOMATROPINE 5-1.5 MG/5ML PO SYRP: 5.0000 mL | ORAL_SOLUTION | Freq: Three times a day (TID) | ORAL | 0 refills | Status: DC | PRN

## 2017-05-22 NOTE — Assessment & Plan Note (Signed)
Currently on Anoro, has recovered well from his exacerbation, still has a bit of postinfectious cough. I am going to add Hycodan cough syrup and if he continues to cough in 2 weeks we will proceed with pulmonary rehabilitation.

## 2017-05-22 NOTE — Progress Notes (Signed)
Subjective:    CC: Recheck after COPD exacerbation  HPI: Clinton Ortiz is a pleasant 77 year old male with stable COPD currently on Anoro.  He was recently seen with a COPD exacerbation and appropriately prescribed azithromycin, prednisone.  He improved considerably, he still has a bit of a cough with some yellowish sputum production but overall feels significantly better.  He does have some fatigue with walking, his issue is more increase in his back pain on the left side and left buttock rather than chest pain or shortness of breath.  I reviewed the past medical history, family history, social history, surgical history, and allergies today and no changes were needed.  Please see the problem list section below in epic for further details.  Past Medical History: Past Medical History:  Diagnosis Date  . Cancer ([place])    left kidney  . Frequent urination   . Hernia   . Hyperlipidemia   . Hypertension   . Renal insufficiency   . Spondylolysis    Past Surgical History: Past Surgical History:  Procedure Laterality Date  . HERNIA REPAIR    . NEPHRECTOMY     left   Social History: Social History   Socioeconomic History  . Marital status: Married    Spouse name: Not on file  . Number of children: Not on file  . Years of education: Not on file  . Highest education level: Not on file  Social Needs  . Financial resource strain: Not on file  . Food insecurity - worry: Not on file  . Food insecurity - inability: Not on file  . Transportation needs - medical: Not on file  . Transportation needs - non-medical: Not on file  Occupational History  . Not on file  Tobacco Use  . Smoking status: Former Smoker    Packs/day: 1.00    Years: 2.00    Pack years: 2.00    Types: Cigarettes  . Smokeless tobacco: Never Used  Substance and Sexual Activity  . Alcohol use: No  . Drug use: No  . Sexual activity: No  Other Topics Concern  . Not on file  Social History Narrative  . Not on file    Family History: Family History  Problem Relation Age of Onset  . Cancer Mother        bladder  . Heart attack Father    Allergies: Allergies  Allergen Reactions  . Penicillins Nausea And Vomiting   Medications: See med rec.  Review of Systems: No fevers, chills, night sweats, weight loss, chest pain, or shortness of breath.   Objective:    General: Well Developed, well nourished, and in no acute distress.  Neuro: Alert and oriented x3, extra-ocular muscles intact, sensation grossly intact.  HEENT: Normocephalic, atraumatic, pupils equal round reactive to light, neck supple, no masses, no lymphadenopathy, thyroid nonpalpable.  Oropharynx, nasopharynx, ear canals unremarkable. Skin: Warm and dry, no rashes. Cardiac: Regular rate and rhythm, no murmurs rubs or gallops, no lower extremity edema.  Respiratory: Clear to auscultation bilaterally. Not using accessory muscles, speaking in full sentences.  Impression and Recommendations:    COPD (chronic obstructive pulmonary disease) Currently on Anoro, has recovered well from his exacerbation, still has a bit of postinfectious cough. I am going to add Hycodan cough syrup and if he continues to cough in 2 weeks we will proceed with pulmonary rehabilitation.  ___________________________________________ Gwen Her. Dianah Field, M.D., ABFM., CAQSM. Primary Care and [place] Instructor of Family Medicine  [employer]

## 2017-06-01 ENCOUNTER — Encounter: Payer: Medicare Other | Admitting: Sports Medicine

## 2017-06-01 DIAGNOSIS — Z0189 Encounter for other specified special examinations: Secondary | ICD-10-CM

## 2017-06-04 ENCOUNTER — Ambulatory Visit (INDEPENDENT_AMBULATORY_CARE_PROVIDER_SITE_OTHER): Payer: Medicare Other | Admitting: Sports Medicine

## 2017-06-04 ENCOUNTER — Other Ambulatory Visit: Payer: Self-pay | Admitting: Sports Medicine

## 2017-06-04 ENCOUNTER — Encounter: Payer: Self-pay | Admitting: Sports Medicine

## 2017-06-04 VITALS — BP 116/78 | HR 110 | Ht 67.0 in | Wt 263.0 lb

## 2017-06-04 DIAGNOSIS — J440 Chronic obstructive pulmonary disease with acute lower respiratory infection: Secondary | ICD-10-CM | POA: Diagnosis not present

## 2017-06-04 DIAGNOSIS — Z0000 Encounter for general adult medical examination without abnormal findings: Secondary | ICD-10-CM

## 2017-06-04 DIAGNOSIS — K219 Gastro-esophageal reflux disease without esophagitis: Secondary | ICD-10-CM | POA: Diagnosis not present

## 2017-06-04 DIAGNOSIS — E7800 Pure hypercholesterolemia, unspecified: Secondary | ICD-10-CM

## 2017-06-04 DIAGNOSIS — E1121 Type 2 diabetes mellitus with diabetic nephropathy: Secondary | ICD-10-CM

## 2017-06-04 DIAGNOSIS — I10 Essential (primary) hypertension: Secondary | ICD-10-CM

## 2017-06-04 MED ORDER — PANTOPRAZOLE SODIUM 40 MG PO TBEC: 40.0000 mg | DELAYED_RELEASE_TABLET | Freq: Every day | ORAL | 3 refills | Status: DC

## 2017-06-04 MED ORDER — FLUTICASONE-UMECLIDIN-VILANT 100-62.5-25 MCG/INH IN AEPB: 1.0000 | INHALATION_SPRAY | Freq: Every day | RESPIRATORY_TRACT | 11 refills | Status: DC

## 2017-06-04 NOTE — Assessment & Plan Note (Signed)
Of ranitidine in the mornings. Adding pantoprazole at night. Because his symptoms have been present for so long I would like him to touch base with his ENT for direct visualization.

## 2017-06-04 NOTE — Assessment & Plan Note (Signed)
Hypertension is well controlled, still a little bit tachycardic.

## 2017-06-04 NOTE — Progress Notes (Signed)
Subjective:   Clinton Ortiz is a 77 y.o. male who presents for Medicare Annual/Subsequent preventive examination.  Hypertension: Well-controlled.  Hyperlipidemia: Well-controlled historically.  Diabetes mellitus type 2: Previous A1c 5 months ago was 5.7, he does have diabetic eye exam coming up next week.  COPD: Currently only on Anoro, still has some persistent coughing.  Not really productive.  He does also admit to missing some doses of his Anoro.  Lump in throat: Taking acid blockers in the morning, does have a bit of hoarseness, ability to bring up mucus at all times.  He has a history of a head and neck cancer treated by ENT.  No hemoptysis.  Review of Systems:  Unremarkable comprehensive review of systems except as noted below       Objective:    Vitals: BP 116/78   Pulse (!) 110   Ht 5\' 7"  (1.702 m)   Wt 263 lb (119.3 kg)   SpO2 93%   BMI 41.19 kg/m   Body mass index is 41.19 kg/m.  No flowsheet data found.  Tobacco Social History   Tobacco Use  Smoking Status Former Smoker  . Packs/day: 1.00  . Years: 2.00  . Pack years: 2.00  . Types: Cigarettes  Smokeless Tobacco Never Used     Counseling given: Not Answered  General: Well Developed, well nourished, and in no acute distress.  Neuro: Alert and oriented x3, extra-ocular muscles intact, sensation grossly intact. Cranial nerves II through XII are intact, motor, sensory, and coordinative functions are all intact. HEENT: Normocephalic, atraumatic, pupils equal round reactive to light, neck supple, no masses, no lymphadenopathy, thyroid nonpalpable. Oropharynx, nasopharynx, external ear canals are unremarkable. Skin: Warm and dry, no rashes noted.  Cardiac: Regular rate and rhythm, no murmurs rubs or gallops.  Respiratory: There are a few diffuse and intermittent expiratory wheezes. Not using accessory muscles, speaking in full sentences.  Abdominal: Soft, nontender, nondistended, positive bowel sounds, no  masses, no organomegaly.  Musculoskeletal: Shoulder, elbow, wrist, hip, knee, ankle stable, and with full range of motion.    Past Medical History:  Diagnosis Date  . Cancer ([place])    left kidney  . Frequent urination   . Hernia   . Hyperlipidemia   . Hypertension   . Renal insufficiency   . Spondylolysis    Past Surgical History:  Procedure Laterality Date  . HERNIA REPAIR    . NEPHRECTOMY     left   Family History  Problem Relation Age of Onset  . Cancer Mother        bladder  . Heart attack Father    Social History   Socioeconomic History  . Marital status: Married    Spouse name: None  . Number of children: None  . Years of education: None  . Highest education level: None  Social Needs  . Financial resource strain: None  . Food insecurity - worry: None  . Food insecurity - inability: None  . Transportation needs - medical: None  . Transportation needs - non-medical: None  Occupational History  . None  Tobacco Use  . Smoking status: Former Smoker    Packs/day: 1.00    Years: 2.00    Pack years: 2.00    Types: Cigarettes  . Smokeless tobacco: Never Used  Substance and Sexual Activity  . Alcohol use: No  . Drug use: No  . Sexual activity: No  Other Topics Concern  . None  Social History Narrative  . None  Outpatient Encounter Medications as of 06/04/2017  Medication Sig  . acetaminophen (TYLENOL) 325 MG tablet Take 650 mg by mouth every 6 (six) hours as needed.  . Albuterol Sulfate (PROAIR RESPICLICK) 161 (90 BASE) MCG/ACT AEPB Inhale 2 puffs into the lungs every 4 (four) hours as needed.  Marland Kitchen aspirin 325 MG EC tablet Take 325 mg by mouth daily.  . B Complex Vitamins (B COMPLEX PO) Take by mouth daily.  . Dulaglutide (TRULICITY) 1.5 WR/6.0AV SOPN Inject 1.5 mg into the skin once a week. NEEDS APPOINTMENT FOR FUTURE REFILLS  . Fish Oil-Krill Oil (KRILL OIL PLUS PO) Take by mouth daily.  . furosemide (LASIX) 40 MG tablet Take 40 mg by mouth as needed.  Reported on 11/29/2015  . gabapentin (NEURONTIN) 100 MG capsule Take 1 capsule (100 mg total) by mouth 3 (three) times daily.  Marland Kitchen glipiZIDE (GLUCOTROL) 10 MG tablet TAKE 1 TABLET (10 MG TOTAL) BY MOUTH 2 (TWO) TIMES DAILY BEFORE A MEAL.  Marland Kitchen losartan (COZAAR) 100 MG tablet TAKE 1 TABLET (100 MG TOTAL) BY MOUTH DAILY.  . metFORMIN (GLUCOPHAGE) 1000 MG tablet Take 1 tablet (1,000 mg total) by mouth 2 (two) times daily with a meal.  . metoCLOPramide (REGLAN) 10 MG tablet TAKE 1 TABLET (10 MG TOTAL) BY MOUTH 3 (THREE) TIMES DAILY BEFORE MEALS.  . Multiple Vitamins-Minerals (MULTIVITAMIN ADULTS 50+ PO) Take by mouth.  . pravastatin (PRAVACHOL) 40 MG tablet TAKE 1 TABLET (40 MG TOTAL) BY MOUTH DAILY.  . Probiotic Product ([employer] ADULT 50+ PROBIOTIC PO) Take by mouth daily.  . tamsulosin (FLOMAX) 0.4 MG CAPS capsule TAKE 2 CAPSULES BY MOUTH EVERY DAY  . traMADol (ULTRAM) 50 MG tablet TAKE 1 TABLET EVERY 6 HOURS AS NEEDED FOR PAIN  . [DISCONTINUED] cyclobenzaprine (FLEXERIL) 10 MG tablet One half tab PO qHS, then increase gradually to one tab TID.  . [DISCONTINUED] HYDROcodone-homatropine (HYCODAN) 5-1.5 MG/5ML syrup Take 5 mLs by mouth every 8 (eight) hours as needed for cough.  . [DISCONTINUED] ondansetron (ZOFRAN-ODT) 8 MG disintegrating tablet Take 1 tablet (8 mg total) by mouth every 8 (eight) hours as needed for nausea.  . [DISCONTINUED] umeclidinium-vilanterol (ANORO ELLIPTA) 62.5-25 MCG/INH AEPB Inhale 1 puff into the lungs daily.  . Fluticasone-Umeclidin-Vilant (TRELEGY ELLIPTA) 100-62.5-25 MCG/INH AEPB Inhale 1 puff into the lungs daily.  . pantoprazole (PROTONIX) 40 MG tablet Take 1 tablet (40 mg total) by mouth at bedtime.   No facility-administered encounter medications on file as of 06/04/2017.     Activities of Daily Living In your present state of health, do you have any difficulty performing the following activities: 06/04/2017  Hearing? Y  Comment Right ear  Vision? Y  Comment Right eye    Difficulty concentrating or making decisions? Y  Walking or climbing stairs? N  Dressing or bathing? N  Doing errands, shopping? Y  Comment Due to back pain and SOB.  Some recent data might be hidden    Patient Care Team: Silverio Decamp, MD as PCP - General (Family Medicine)   Assessment:   This is a routine wellness examination for Clinton Ortiz.  Exercise Activities and Dietary recommendations    Goals    None      Fall Risk Fall Risk  06/04/2017 04/17/2016  Falls in the past year? Yes No  Comment - Emmi Telephone Survey: data to providers prior to load   Is the patient's home free of loose throw rugs in walkways, pet beds, electrical cords, etc?   no  Grab bars in the bathroom? yes      Handrails on the stairs?   yes      Adequate lighting?   yes  Timed Get Up and Go Performed: No  Depression Screen PHQ 2/9 Scores 06/04/2017 05/22/2017  PHQ - 2 Score 0 0  PHQ- 9 Score - 4    Cognitive Function        Immunization History  Administered Date(s) Administered  . Influenza,inj,Quad PF,6+ Mos 02/14/2013, 02/02/2014, 06/07/2015, 02/29/2016  . Pneumococcal Conjugate-13 09/13/2014  . Pneumococcal Polysaccharide-23 11/29/2015  . Tdap 02/14/2013    Qualifies for Shingles Vaccine? Done  Screening Tests Health Maintenance  Topic Date Due  . OPHTHALMOLOGY EXAM  06/19/2016  . HEMOGLOBIN A1C  06/27/2017  . FOOT EXAM  12/25/2017  . TETANUS/TDAP  02/15/2023  . INFLUENZA VACCINE  Completed  . PNA vac Low Risk Adult  Completed   Cancer Screenings: Lung: Low Dose CT Chest recommended if Age 83-80 years, 30 pack-year currently smoking OR have quit w/in 15years. Patient does not qualify. Colorectal: Done with screening  Additional Screenings: None needed Hepatitis B/HIV/Syphillis: Done Hepatitis C Screening: Done    Plan:   77 year old male with COPD, multiple other medical problems, please see below in assessment and plan for further details.  I have personally  reviewed and noted the following in the patient's chart:   . Medical and social history . Use of alcohol, tobacco or illicit drugs  . Current medications and supplements . Functional ability and status . Nutritional status . Physical activity . Advanced directives . List of other physicians . Hospitalizations, surgeries, and ER visits in previous 12 months . Vitals . Screenings to include cognitive, depression, and falls . Referrals and appointments  In addition, I have reviewed and discussed with patient certain preventive protocols, quality metrics, and best practice recommendations. A written personalized care plan for preventive services as well as general preventive health recommendations were provided to patient.    ___________________________________________ Gwen Her. Dianah Field, M.D., ABFM., CAQSM. Primary Care and [place] Instructor of [place] of [facility] of Medicine

## 2017-06-04 NOTE — Assessment & Plan Note (Signed)
Persistent symptoms in spite of Anoro, switching to Trelegy. If Trelegy is too expensive we will simply go back to Anoro and add inhaled fluticasone.

## 2017-06-04 NOTE — Assessment & Plan Note (Signed)
Rechecking lipids. 

## 2017-06-04 NOTE — Assessment & Plan Note (Signed)
Historically controlled on Trulicity, metformin, glipizide. Rechecking hemoglobin A1c and other labs. He does have a diabetic eye exam coming up next week.

## 2017-06-04 NOTE — Assessment & Plan Note (Signed)
Medicare physical as above. Checking routine blood work.

## 2017-06-05 ENCOUNTER — Encounter: Payer: Self-pay | Admitting: Sports Medicine

## 2017-06-05 DIAGNOSIS — E1122 Type 2 diabetes mellitus with diabetic chronic kidney disease: Secondary | ICD-10-CM | POA: Insufficient documentation

## 2017-06-05 DIAGNOSIS — N183 Chronic kidney disease, stage 3 (moderate): Secondary | ICD-10-CM

## 2017-06-05 LAB — CBC
HCT: 42.8 % (ref 38.5–50.0)
Hemoglobin: 14.8 g/dL (ref 13.2–17.1)
MCH: 32.4 pg (ref 27.0–33.0)
MCHC: 34.6 g/dL (ref 32.0–36.0)
MCV: 93.7 fL (ref 80.0–100.0)
MPV: 12.8 fL — ABNORMAL HIGH (ref 7.5–12.5)
Platelets: 199 Thousand/uL (ref 140–400)
RBC: 4.57 10*6/uL (ref 4.20–5.80)
RDW: 12.5 % (ref 11.0–15.0)
WBC: 7.5 Thousand/uL (ref 3.8–10.8)

## 2017-06-05 LAB — COMPREHENSIVE METABOLIC PANEL
AG Ratio: 1.5 (calc) (ref 1.0–2.5)
AST: 22 U/L (ref 10–35)
Albumin: 4.1 g/dL (ref 3.6–5.1)
Alkaline phosphatase (APISO): 91 U/L (ref 40–115)
CO2: 24 mmol/L (ref 20–32)
Calcium: 9.8 mg/dL (ref 8.6–10.3)
Chloride: 110 mmol/L (ref 98–110)
Globulin: 2.7 g/dL (calc) (ref 1.9–3.7)
Potassium: 4.9 mmol/L (ref 3.5–5.3)
Total Bilirubin: 0.4 mg/dL (ref 0.2–1.2)

## 2017-06-05 LAB — LIPID PANEL W/REFLEX DIRECT LDL
Cholesterol: 143 mg/dL (ref <200)
HDL: 31 mg/dL — ABNORMAL LOW (ref >40)
LDL Cholesterol (Calc): 75 mg/dL
Non-HDL Cholesterol (Calc): 112 mg/dL (ref <130)
Total CHOL/HDL Ratio: 4.6 (calc) (ref <5.0)
Triglycerides: 281 mg/dL — ABNORMAL HIGH (ref <150)

## 2017-06-05 LAB — TSH: TSH: 3.42 mIU/L (ref 0.40–4.50)

## 2017-06-05 LAB — COMPREHENSIVE METABOLIC PANEL WITH GFR
ALT: 25 U/L (ref 9–46)
BUN/Creatinine Ratio: 13 (calc) (ref 6–22)
BUN: 27 mg/dL — ABNORMAL HIGH (ref 7–25)
Creat: 2.14 mg/dL — ABNORMAL HIGH (ref 0.70–1.18)
Glucose, Bld: 127 mg/dL — ABNORMAL HIGH (ref 65–99)
Sodium: 144 mmol/L (ref 135–146)
Total Protein: 6.8 g/dL (ref 6.1–8.1)

## 2017-06-05 LAB — HEMOGLOBIN A1C
Hgb A1c MFr Bld: 7 % of total Hgb — ABNORMAL HIGH (ref <5.7)
Mean Plasma Glucose: 154 (calc)
eAG (mmol/L): 8.5 (calc)

## 2017-06-09 DIAGNOSIS — Z85810 Personal history of malignant neoplasm of tongue: Secondary | ICD-10-CM | POA: Diagnosis not present

## 2017-06-09 DIAGNOSIS — Z08 Encounter for follow-up examination after completed treatment for malignant neoplasm: Secondary | ICD-10-CM | POA: Diagnosis not present

## 2017-06-10 DIAGNOSIS — R109 Unspecified abdominal pain: Secondary | ICD-10-CM | POA: Diagnosis not present

## 2017-06-10 DIAGNOSIS — Z880 Allergy status to penicillin: Secondary | ICD-10-CM | POA: Diagnosis not present

## 2017-06-10 DIAGNOSIS — I10 Essential (primary) hypertension: Secondary | ICD-10-CM | POA: Diagnosis not present

## 2017-06-10 DIAGNOSIS — Z91010 Allergy to peanuts: Secondary | ICD-10-CM | POA: Diagnosis not present

## 2017-06-10 DIAGNOSIS — E119 Type 2 diabetes mellitus without complications: Secondary | ICD-10-CM | POA: Diagnosis not present

## 2017-06-10 DIAGNOSIS — K5900 Constipation, unspecified: Secondary | ICD-10-CM | POA: Diagnosis not present

## 2017-06-10 DIAGNOSIS — Z79899 Other long term (current) drug therapy: Secondary | ICD-10-CM | POA: Diagnosis not present

## 2017-06-10 DIAGNOSIS — Z7984 Long term (current) use of oral hypoglycemic drugs: Secondary | ICD-10-CM | POA: Diagnosis not present

## 2017-06-10 DIAGNOSIS — K760 Fatty (change of) liver, not elsewhere classified: Secondary | ICD-10-CM | POA: Diagnosis not present

## 2017-06-10 DIAGNOSIS — C649 Malignant neoplasm of unspecified kidney, except renal pelvis: Secondary | ICD-10-CM | POA: Diagnosis not present

## 2017-06-10 DIAGNOSIS — E785 Hyperlipidemia, unspecified: Secondary | ICD-10-CM | POA: Diagnosis not present

## 2017-06-10 DIAGNOSIS — R1031 Right lower quadrant pain: Secondary | ICD-10-CM | POA: Diagnosis not present

## 2017-06-10 DIAGNOSIS — N400 Enlarged prostate without lower urinary tract symptoms: Secondary | ICD-10-CM | POA: Diagnosis not present

## 2017-06-10 DIAGNOSIS — Z905 Acquired absence of kidney: Secondary | ICD-10-CM | POA: Diagnosis not present

## 2017-06-10 DIAGNOSIS — Z888 Allergy status to other drugs, medicaments and biological substances status: Secondary | ICD-10-CM | POA: Diagnosis not present

## 2017-06-10 DIAGNOSIS — Z7982 Long term (current) use of aspirin: Secondary | ICD-10-CM | POA: Diagnosis not present

## 2017-06-10 DIAGNOSIS — N132 Hydronephrosis with renal and ureteral calculous obstruction: Secondary | ICD-10-CM | POA: Diagnosis not present

## 2017-06-10 DIAGNOSIS — Z87891 Personal history of nicotine dependence: Secondary | ICD-10-CM | POA: Diagnosis not present

## 2017-06-10 DIAGNOSIS — R112 Nausea with vomiting, unspecified: Secondary | ICD-10-CM | POA: Diagnosis not present

## 2017-06-10 DIAGNOSIS — N200 Calculus of kidney: Secondary | ICD-10-CM | POA: Diagnosis not present

## 2017-06-10 DIAGNOSIS — J449 Chronic obstructive pulmonary disease, unspecified: Secondary | ICD-10-CM | POA: Diagnosis not present

## 2017-06-11 DIAGNOSIS — N201 Calculus of ureter: Secondary | ICD-10-CM | POA: Diagnosis not present

## 2017-06-14 DIAGNOSIS — R6521 Severe sepsis with septic shock: Secondary | ICD-10-CM | POA: Diagnosis not present

## 2017-06-14 DIAGNOSIS — I4510 Unspecified right bundle-branch block: Secondary | ICD-10-CM | POA: Diagnosis not present

## 2017-06-14 DIAGNOSIS — Z87891 Personal history of nicotine dependence: Secondary | ICD-10-CM | POA: Diagnosis not present

## 2017-06-14 DIAGNOSIS — Z7984 Long term (current) use of oral hypoglycemic drugs: Secondary | ICD-10-CM | POA: Diagnosis not present

## 2017-06-14 DIAGNOSIS — Z9989 Dependence on other enabling machines and devices: Secondary | ICD-10-CM | POA: Diagnosis not present

## 2017-06-14 DIAGNOSIS — Z85528 Personal history of other malignant neoplasm of kidney: Secondary | ICD-10-CM | POA: Diagnosis not present

## 2017-06-14 DIAGNOSIS — G4733 Obstructive sleep apnea (adult) (pediatric): Secondary | ICD-10-CM | POA: Diagnosis not present

## 2017-06-14 DIAGNOSIS — E875 Hyperkalemia: Secondary | ICD-10-CM | POA: Diagnosis not present

## 2017-06-14 DIAGNOSIS — N401 Enlarged prostate with lower urinary tract symptoms: Secondary | ICD-10-CM | POA: Diagnosis present

## 2017-06-14 DIAGNOSIS — J449 Chronic obstructive pulmonary disease, unspecified: Secondary | ICD-10-CM | POA: Diagnosis not present

## 2017-06-14 DIAGNOSIS — E872 Acidosis: Secondary | ICD-10-CM | POA: Diagnosis not present

## 2017-06-14 DIAGNOSIS — E874 Mixed disorder of acid-base balance: Secondary | ICD-10-CM | POA: Diagnosis not present

## 2017-06-14 DIAGNOSIS — R109 Unspecified abdominal pain: Secondary | ICD-10-CM | POA: Diagnosis not present

## 2017-06-14 DIAGNOSIS — Z85810 Personal history of malignant neoplasm of tongue: Secondary | ICD-10-CM | POA: Diagnosis not present

## 2017-06-14 DIAGNOSIS — K219 Gastro-esophageal reflux disease without esophagitis: Secondary | ICD-10-CM | POA: Diagnosis not present

## 2017-06-14 DIAGNOSIS — I10 Essential (primary) hypertension: Secondary | ICD-10-CM | POA: Diagnosis present

## 2017-06-14 DIAGNOSIS — I129 Hypertensive chronic kidney disease with stage 1 through stage 4 chronic kidney disease, or unspecified chronic kidney disease: Secondary | ICD-10-CM | POA: Diagnosis not present

## 2017-06-14 DIAGNOSIS — Z905 Acquired absence of kidney: Secondary | ICD-10-CM | POA: Diagnosis not present

## 2017-06-14 DIAGNOSIS — I440 Atrioventricular block, first degree: Secondary | ICD-10-CM | POA: Diagnosis not present

## 2017-06-14 DIAGNOSIS — N183 Chronic kidney disease, stage 3 (moderate): Secondary | ICD-10-CM | POA: Diagnosis not present

## 2017-06-14 DIAGNOSIS — N132 Hydronephrosis with renal and ureteral calculous obstruction: Secondary | ICD-10-CM | POA: Diagnosis not present

## 2017-06-14 DIAGNOSIS — R269 Unspecified abnormalities of gait and mobility: Secondary | ICD-10-CM | POA: Diagnosis not present

## 2017-06-14 DIAGNOSIS — N201 Calculus of ureter: Secondary | ICD-10-CM | POA: Diagnosis not present

## 2017-06-14 DIAGNOSIS — E871 Hypo-osmolality and hyponatremia: Secondary | ICD-10-CM | POA: Diagnosis not present

## 2017-06-14 DIAGNOSIS — F329 Major depressive disorder, single episode, unspecified: Secondary | ICD-10-CM | POA: Diagnosis not present

## 2017-06-14 DIAGNOSIS — J9601 Acute respiratory failure with hypoxia: Secondary | ICD-10-CM | POA: Diagnosis not present

## 2017-06-14 DIAGNOSIS — A419 Sepsis, unspecified organism: Secondary | ICD-10-CM | POA: Diagnosis not present

## 2017-06-14 DIAGNOSIS — N138 Other obstructive and reflux uropathy: Secondary | ICD-10-CM | POA: Diagnosis not present

## 2017-06-14 DIAGNOSIS — E1122 Type 2 diabetes mellitus with diabetic chronic kidney disease: Secondary | ICD-10-CM | POA: Diagnosis not present

## 2017-06-14 DIAGNOSIS — E785 Hyperlipidemia, unspecified: Secondary | ICD-10-CM | POA: Diagnosis not present

## 2017-06-14 DIAGNOSIS — I2510 Atherosclerotic heart disease of native coronary artery without angina pectoris: Secondary | ICD-10-CM | POA: Diagnosis present

## 2017-06-14 DIAGNOSIS — N179 Acute kidney failure, unspecified: Secondary | ICD-10-CM | POA: Diagnosis not present

## 2017-06-14 DIAGNOSIS — Z9119 Patient's noncompliance with other medical treatment and regimen: Secondary | ICD-10-CM | POA: Diagnosis not present

## 2017-06-14 DIAGNOSIS — K429 Umbilical hernia without obstruction or gangrene: Secondary | ICD-10-CM | POA: Diagnosis not present

## 2017-06-14 DIAGNOSIS — N136 Pyonephrosis: Secondary | ICD-10-CM | POA: Diagnosis not present

## 2017-06-14 DIAGNOSIS — Z960 Presence of urogenital implants: Secondary | ICD-10-CM | POA: Diagnosis not present

## 2017-06-14 DIAGNOSIS — R110 Nausea: Secondary | ICD-10-CM | POA: Diagnosis not present

## 2017-06-14 DIAGNOSIS — N12 Tubulo-interstitial nephritis, not specified as acute or chronic: Secondary | ICD-10-CM | POA: Diagnosis not present

## 2017-06-14 DIAGNOSIS — Z87442 Personal history of urinary calculi: Secondary | ICD-10-CM | POA: Diagnosis not present

## 2017-06-14 DIAGNOSIS — Z6841 Body Mass Index (BMI) 40.0 and over, adult: Secondary | ICD-10-CM | POA: Diagnosis not present

## 2017-06-14 DIAGNOSIS — Z955 Presence of coronary angioplasty implant and graft: Secondary | ICD-10-CM | POA: Diagnosis not present

## 2017-06-14 DIAGNOSIS — J9602 Acute respiratory failure with hypercapnia: Secondary | ICD-10-CM | POA: Diagnosis not present

## 2017-06-14 DIAGNOSIS — E1121 Type 2 diabetes mellitus with diabetic nephropathy: Secondary | ICD-10-CM | POA: Diagnosis not present

## 2017-06-14 DIAGNOSIS — Z89512 Acquired absence of left leg below knee: Secondary | ICD-10-CM | POA: Diagnosis not present

## 2017-06-14 DIAGNOSIS — N200 Calculus of kidney: Secondary | ICD-10-CM | POA: Diagnosis not present

## 2017-06-18 ENCOUNTER — Inpatient Hospital Stay: Payer: Medicare Other | Admitting: Sports Medicine

## 2017-06-22 ENCOUNTER — Ambulatory Visit (INDEPENDENT_AMBULATORY_CARE_PROVIDER_SITE_OTHER): Payer: Medicare Other | Admitting: Sports Medicine

## 2017-06-22 ENCOUNTER — Encounter: Payer: Self-pay | Admitting: Sports Medicine

## 2017-06-22 DIAGNOSIS — N390 Urinary tract infection, site not specified: Secondary | ICD-10-CM

## 2017-06-22 DIAGNOSIS — J440 Chronic obstructive pulmonary disease with acute lower respiratory infection: Secondary | ICD-10-CM | POA: Diagnosis not present

## 2017-06-22 DIAGNOSIS — A419 Sepsis, unspecified organism: Secondary | ICD-10-CM

## 2017-06-22 DIAGNOSIS — E1121 Type 2 diabetes mellitus with diabetic nephropathy: Secondary | ICD-10-CM

## 2017-06-22 NOTE — Assessment & Plan Note (Signed)
Historically controlled on Trulicity, metformin, glipizide. Metformin was decreased secondary to his renal failure, blood sugars are running fairly low, we are going to hold his current metformin dose at its current level.

## 2017-06-22 NOTE — Progress Notes (Addendum)
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  Was seen in the emergency department and noted to be in acute renal failure with a creatinine of 8, hyperkalemic, and with 2 nephroliths, 1 in the ureter and one in the urethra.  He was treated aggressively, admitted, after a stent was placed his creatinine began to trend down rapidly and his potassium normalized.  His stay was complicated by a difficult extubation, septic shock requiring pressors and ICU stay.  Through his hospitalization his lab tests improved considerably.  He was discharged in stable condition.  He was taken off of his Trelegy and his metformin dose was decreased.  He does have a lithotripsy is coming up this week.  I reviewed the past medical history, family history, social history, surgical history, and allergies today and no changes were needed.  Please see the problem list section below in epic for further details.  Past Medical History: Past Medical History:  Diagnosis Date  . Cancer ([place])    left kidney  . Frequent urination   . Hernia   . Hyperlipidemia   . Hypertension   . Renal insufficiency   . Spondylolysis    Past Surgical History: Past Surgical History:  Procedure Laterality Date  . HERNIA REPAIR    . NEPHRECTOMY     left   Social History: Social History   Socioeconomic History  . Marital status: Married    Spouse name: None  . Number of children: None  . Years of education: None  . Highest education level: None  Social Needs  . Financial resource strain: None  . Food insecurity - worry: None  . Food insecurity - inability: None  . Transportation needs - medical: None  . Transportation needs - non-medical: None  Occupational History  . None  Tobacco Use  . Smoking status: Former Smoker    Packs/day: 1.00    Years: 2.00   Pack years: 2.00    Types: Cigarettes  . Smokeless tobacco: Never Used  Substance and Sexual Activity  . Alcohol use: No  . Drug use: No  . Sexual activity: No  Other Topics Concern  . None  Social History Narrative  . None   Family History: Family History  Problem Relation Age of Onset  . Cancer Mother        bladder  . Heart attack Father    Allergies: Allergies  Allergen Reactions  . Penicillins Nausea And Vomiting   Medications: See med rec.  Review of Systems: No fevers, chills, night sweats, weight loss, chest pain, or shortness of breath.   Objective:    General: Well Developed, well nourished, and in no acute distress.  Neuro: Alert and oriented x3, extra-ocular muscles intact, sensation grossly intact.  HEENT: Normocephalic, atraumatic, pupils equal round reactive to light, neck supple, no masses, no lymphadenopathy, thyroid nonpalpable.  Skin: Warm and dry, no rashes. Cardiac: Regular rate and rhythm, no murmurs rubs or gallops, no lower extremity edema.  Respiratory: Clear to auscultation bilaterally. Not using accessory muscles, speaking in full sentences. Abdomen: Soft, nontender, nondistended, normal bowel sounds, no palpable masses, no guarding, rigidity, rebound tenderness, mild right costovertebral angle pain.  Impression and Recommendations:    Sepsis due to urinary tract infection ([place]) Septic shock, intubated, ended up with a right pyelonephritis, obstruction with 2 stones, one in the  ureter, one in the urethra. Stent placed, acute on chronic renal failure improved from a creatinine of 8 down to 2. Hyperkalemia improved as well with stent placement. Lithotripsy coming up later this week. Rechecking CBC, CMP, urinalysis, urine culture, blood cultures. He was discharged with ciprofloxacin, has 1 more day, we may need to extend this course based on the appearance of his urine test.  Restarting Cipro 750 twice a day for 10 days, still with significant  pyuria.  I do expect some hematuria with his stent in place.  Controlled type 2 diabetes mellitus with diabetic nephropathy ([place]) Historically controlled on Trulicity, metformin, glipizide. Metformin was decreased secondary to his renal failure, blood sugars are running fairly low, we are going to hold his current metformin dose at its current level.  COPD (chronic obstructive pulmonary disease) Trelegy was working extremely well, discontinued during hospitalization for septic shock. Restart Trelegy, he is having increasing cough.  ___________________________________________ Gwen Her. Dianah Field, M.D., ABFM., CAQSM. Primary Care and [place] Instructor of [place] of [facility] of Medicine

## 2017-06-22 NOTE — Assessment & Plan Note (Addendum)
Septic shock, intubated, ended up with a right pyelonephritis, obstruction with 2 stones, one in the ureter, one in the urethra. Stent placed, acute on chronic renal failure improved from a creatinine of 8 down to 2. Hyperkalemia improved as well with stent placement. Lithotripsy coming up later this week. Rechecking CBC, CMP, urinalysis, urine culture, blood cultures. He was discharged with ciprofloxacin, has 1 more day, we may need to extend this course based on the appearance of his urine test.  Restarting Cipro 750 twice a day for 10 days, still with significant pyuria.  I do expect some hematuria with his stent in place.

## 2017-06-22 NOTE — Assessment & Plan Note (Signed)
Trelegy was working extremely well, discontinued during hospitalization for septic shock. Restart Trelegy, he is having increasing cough.

## 2017-06-23 LAB — CBC WITH DIFFERENTIAL/PLATELET
Basophils Absolute: 69 {cells}/uL (ref 0–200)
Basophils Relative: 0.8 %
Eosinophils Absolute: 473 cells/uL (ref 15–500)
Eosinophils Relative: 5.5 %
HCT: 41.1 % (ref 38.5–50.0)
Hemoglobin: 13.9 g/dL (ref 13.2–17.1)
Lymphs Abs: 1376 cells/uL (ref 850–3900)
MCH: 32.2 pg (ref 27.0–33.0)
MCHC: 33.8 g/dL (ref 32.0–36.0)
MCV: 95.1 fL (ref 80.0–100.0)
MPV: 12.2 fL (ref 7.5–12.5)
Monocytes Relative: 11.4 %
Neutro Abs: 5702 {cells}/uL (ref 1500–7800)
Neutrophils Relative %: 66.3 %
Platelets: 250 10*3/uL (ref 140–400)
RBC: 4.32 10*6/uL (ref 4.20–5.80)
RDW: 12.3 % (ref 11.0–15.0)
Total Lymphocyte: 16 %
WBC mixed population: 980 cells/uL — ABNORMAL HIGH (ref 200–950)
WBC: 8.6 10*3/uL (ref 3.8–10.8)

## 2017-06-23 LAB — COMPREHENSIVE METABOLIC PANEL
AG Ratio: 1.7 (calc) (ref 1.0–2.5)
ALT: 33 U/L (ref 9–46)
AST: 29 U/L (ref 10–35)
Albumin: 4.1 g/dL (ref 3.6–5.1)
Alkaline phosphatase (APISO): 85 U/L (ref 40–115)
BUN: 31 mg/dL — ABNORMAL HIGH (ref 7–25)
CO2: 22 mmol/L (ref 20–32)
Chloride: 108 mmol/L (ref 98–110)
Creat: 2.03 mg/dL — ABNORMAL HIGH (ref 0.70–1.18)
Glucose, Bld: 110 mg/dL — ABNORMAL HIGH (ref 65–99)

## 2017-06-23 LAB — COMPREHENSIVE METABOLIC PANEL WITH GFR
BUN/Creatinine Ratio: 15 (calc) (ref 6–22)
Calcium: 9.2 mg/dL (ref 8.6–10.3)
Globulin: 2.4 g/dL (ref 1.9–3.7)
Potassium: 5.3 mmol/L (ref 3.5–5.3)
Sodium: 139 mmol/L (ref 135–146)
Total Bilirubin: 0.4 mg/dL (ref 0.2–1.2)
Total Protein: 6.5 g/dL (ref 6.1–8.1)

## 2017-06-23 LAB — URINE CULTURE
MICRO NUMBER:: 90181269
Result:: NO GROWTH
SPECIMEN QUALITY:: ADEQUATE

## 2017-06-23 LAB — URINALYSIS
Bilirubin Urine: NEGATIVE
Glucose, UA: NEGATIVE
Ketones, ur: NEGATIVE
Nitrite: NEGATIVE
Specific Gravity, Urine: 1.017 (ref 1.001–1.03)
pH: 5.5 (ref 5.0–8.0)

## 2017-06-23 MED ORDER — CIPROFLOXACIN HCL 750 MG PO TABS: 750.0000 mg | ORAL_TABLET | Freq: Two times a day (BID) | ORAL | 0 refills | Status: DC

## 2017-06-23 NOTE — Addendum Note (Signed)
Addended by: Silverio Decamp on: 06/23/2017 11:58 AM   Modules accepted: Orders

## 2017-06-26 DIAGNOSIS — G478 Other sleep disorders: Secondary | ICD-10-CM | POA: Diagnosis not present

## 2017-06-26 DIAGNOSIS — N201 Calculus of ureter: Secondary | ICD-10-CM | POA: Diagnosis not present

## 2017-06-26 DIAGNOSIS — R400 Somnolence: Secondary | ICD-10-CM | POA: Diagnosis not present

## 2017-06-26 DIAGNOSIS — G4719 Other hypersomnia: Secondary | ICD-10-CM | POA: Diagnosis not present

## 2017-06-26 DIAGNOSIS — I10 Essential (primary) hypertension: Secondary | ICD-10-CM | POA: Diagnosis not present

## 2017-06-26 DIAGNOSIS — J449 Chronic obstructive pulmonary disease, unspecified: Secondary | ICD-10-CM | POA: Diagnosis not present

## 2017-06-26 DIAGNOSIS — G2581 Restless legs syndrome: Secondary | ICD-10-CM | POA: Diagnosis not present

## 2017-06-26 DIAGNOSIS — R0683 Snoring: Secondary | ICD-10-CM | POA: Diagnosis not present

## 2017-06-26 DIAGNOSIS — N179 Acute kidney failure, unspecified: Secondary | ICD-10-CM | POA: Diagnosis not present

## 2017-06-26 DIAGNOSIS — R29818 Other symptoms and signs involving the nervous system: Secondary | ICD-10-CM | POA: Diagnosis not present

## 2017-06-29 ENCOUNTER — Telehealth: Payer: Self-pay | Admitting: Sports Medicine

## 2017-06-29 DIAGNOSIS — A419 Sepsis, unspecified organism: Secondary | ICD-10-CM

## 2017-06-29 DIAGNOSIS — N390 Urinary tract infection, site not specified: Secondary | ICD-10-CM

## 2017-06-29 DIAGNOSIS — R300 Dysuria: Secondary | ICD-10-CM

## 2017-06-29 NOTE — Addendum Note (Signed)
Addended by: Silverio Decamp on: 06/29/2017 01:50 PM   Modules accepted: Orders

## 2017-06-29 NOTE — Telephone Encounter (Signed)
Urinary symptoms, suspect prostatitis. Adding urinalysis, urine culture.

## 2017-06-30 ENCOUNTER — Other Ambulatory Visit: Payer: Self-pay | Admitting: Emergency Medicine

## 2017-06-30 LAB — URINE CULTURE
MICRO NUMBER:: 90213737
SPECIMEN QUALITY:: ADEQUATE

## 2017-06-30 LAB — URINALYSIS
Bilirubin Urine: NEGATIVE
Glucose, UA: NEGATIVE
Nitrite: NEGATIVE
Specific Gravity, Urine: 1.024 (ref 1.001–1.03)
pH: 5.5 (ref 5.0–8.0)

## 2017-06-30 LAB — CULTURE BLOOD MANUAL
Micro Number: 90183011
Result: NO GROWTH

## 2017-06-30 MED ORDER — NITROFURANTOIN MONOHYD MACRO 100 MG PO CAPS: 100.0000 mg | ORAL_CAPSULE | Freq: Two times a day (BID) | ORAL | 0 refills | Status: DC

## 2017-06-30 NOTE — Assessment & Plan Note (Signed)
Leukocytes, increased blood in urine, adding antibiotics.  This time we will use Macrobid considering local susceptibility patterns, no more than 2 weeks considering his pulmonary history.

## 2017-06-30 NOTE — Addendum Note (Signed)
Addended by: Silverio Decamp on: 06/30/2017 08:20 AM   Modules accepted: Orders

## 2017-07-01 DIAGNOSIS — E1122 Type 2 diabetes mellitus with diabetic chronic kidney disease: Secondary | ICD-10-CM | POA: Diagnosis not present

## 2017-07-01 DIAGNOSIS — M1990 Unspecified osteoarthritis, unspecified site: Secondary | ICD-10-CM | POA: Diagnosis not present

## 2017-07-01 DIAGNOSIS — Z888 Allergy status to other drugs, medicaments and biological substances status: Secondary | ICD-10-CM | POA: Diagnosis not present

## 2017-07-01 DIAGNOSIS — G8929 Other chronic pain: Secondary | ICD-10-CM | POA: Diagnosis not present

## 2017-07-01 DIAGNOSIS — I2510 Atherosclerotic heart disease of native coronary artery without angina pectoris: Secondary | ICD-10-CM | POA: Diagnosis not present

## 2017-07-01 DIAGNOSIS — K219 Gastro-esophageal reflux disease without esophagitis: Secondary | ICD-10-CM | POA: Diagnosis not present

## 2017-07-01 DIAGNOSIS — Z85528 Personal history of other malignant neoplasm of kidney: Secondary | ICD-10-CM | POA: Diagnosis not present

## 2017-07-01 DIAGNOSIS — Z7984 Long term (current) use of oral hypoglycemic drugs: Secondary | ICD-10-CM | POA: Diagnosis not present

## 2017-07-01 DIAGNOSIS — N201 Calculus of ureter: Secondary | ICD-10-CM | POA: Diagnosis not present

## 2017-07-01 DIAGNOSIS — E785 Hyperlipidemia, unspecified: Secondary | ICD-10-CM | POA: Diagnosis not present

## 2017-07-01 DIAGNOSIS — Z466 Encounter for fitting and adjustment of urinary device: Secondary | ICD-10-CM | POA: Diagnosis not present

## 2017-07-01 DIAGNOSIS — Z880 Allergy status to penicillin: Secondary | ICD-10-CM | POA: Diagnosis not present

## 2017-07-01 DIAGNOSIS — Z6841 Body Mass Index (BMI) 40.0 and over, adult: Secondary | ICD-10-CM | POA: Diagnosis not present

## 2017-07-01 DIAGNOSIS — Z87442 Personal history of urinary calculi: Secondary | ICD-10-CM | POA: Diagnosis not present

## 2017-07-01 DIAGNOSIS — N183 Chronic kidney disease, stage 3 (moderate): Secondary | ICD-10-CM | POA: Diagnosis not present

## 2017-07-01 DIAGNOSIS — H9190 Unspecified hearing loss, unspecified ear: Secondary | ICD-10-CM | POA: Diagnosis not present

## 2017-07-01 DIAGNOSIS — Z7982 Long term (current) use of aspirin: Secondary | ICD-10-CM | POA: Diagnosis not present

## 2017-07-01 DIAGNOSIS — Z79899 Other long term (current) drug therapy: Secondary | ICD-10-CM | POA: Diagnosis not present

## 2017-07-01 DIAGNOSIS — I129 Hypertensive chronic kidney disease with stage 1 through stage 4 chronic kidney disease, or unspecified chronic kidney disease: Secondary | ICD-10-CM | POA: Diagnosis not present

## 2017-07-01 DIAGNOSIS — N179 Acute kidney failure, unspecified: Secondary | ICD-10-CM | POA: Diagnosis not present

## 2017-07-01 DIAGNOSIS — J449 Chronic obstructive pulmonary disease, unspecified: Secondary | ICD-10-CM | POA: Diagnosis not present

## 2017-07-01 DIAGNOSIS — E1121 Type 2 diabetes mellitus with diabetic nephropathy: Secondary | ICD-10-CM | POA: Diagnosis not present

## 2017-07-01 DIAGNOSIS — G4700 Insomnia, unspecified: Secondary | ICD-10-CM | POA: Diagnosis not present

## 2017-07-01 DIAGNOSIS — Z91018 Allergy to other foods: Secondary | ICD-10-CM | POA: Diagnosis not present

## 2017-07-01 DIAGNOSIS — N400 Enlarged prostate without lower urinary tract symptoms: Secondary | ICD-10-CM | POA: Diagnosis not present

## 2017-07-01 DIAGNOSIS — N132 Hydronephrosis with renal and ureteral calculous obstruction: Secondary | ICD-10-CM | POA: Diagnosis not present

## 2017-07-02 ENCOUNTER — Ambulatory Visit: Payer: Medicare Other | Admitting: Sports Medicine

## 2017-07-02 DIAGNOSIS — Z0189 Encounter for other specified special examinations: Secondary | ICD-10-CM

## 2017-07-10 DIAGNOSIS — N179 Acute kidney failure, unspecified: Secondary | ICD-10-CM | POA: Diagnosis not present

## 2017-07-10 DIAGNOSIS — Z87442 Personal history of urinary calculi: Secondary | ICD-10-CM | POA: Diagnosis not present

## 2017-07-16 ENCOUNTER — Ambulatory Visit (INDEPENDENT_AMBULATORY_CARE_PROVIDER_SITE_OTHER): Payer: Medicare Other | Admitting: Sports Medicine

## 2017-07-16 ENCOUNTER — Encounter: Payer: Self-pay | Admitting: Sports Medicine

## 2017-07-16 DIAGNOSIS — N390 Urinary tract infection, site not specified: Secondary | ICD-10-CM | POA: Diagnosis not present

## 2017-07-16 DIAGNOSIS — E1121 Type 2 diabetes mellitus with diabetic nephropathy: Secondary | ICD-10-CM | POA: Diagnosis not present

## 2017-07-16 DIAGNOSIS — A419 Sepsis, unspecified organism: Secondary | ICD-10-CM

## 2017-07-16 NOTE — Assessment & Plan Note (Signed)
Historically controlled on Trulicity, metformin, glipizide. Metformin was decreased secondary to his acute on chronic renal insufficiency. He has having some low blood sugars into the 50s, I think we should hold the glipizide at this time. Follow-up in 2 weeks.

## 2017-07-16 NOTE — Progress Notes (Signed)
Subjective:    CC: Follow-up  HPI: Clinton Ortiz is a pleasant 77 year old male, he had a right nephrolithiasis, he is post left nephrectomy for renal cancer, ultimately he needed laser stone removal, stenting, he did have some acute on chronic renal injury, nausea and vomiting.  After his stent placement this is improving considerably.  Does get occasional shaking chills at night but overall feels significantly better.  Unfortunately since he has been somewhat nauseated and eating less he is noticing some hypoglycemic episodes into the 50s.  Currently on Trulicity, metformin, glipizide.  I reviewed the past medical history, family history, social history, surgical history, and allergies today and no changes were needed.  Please see the problem list section below in epic for further details.  Past Medical History: Past Medical History:  Diagnosis Date  . Cancer ([place])    left kidney  . Frequent urination   . Hernia   . Hyperlipidemia   . Hypertension   . Renal insufficiency   . Spondylolysis    Past Surgical History: Past Surgical History:  Procedure Laterality Date  . HERNIA REPAIR    . NEPHRECTOMY     left   Social History: Social History   Socioeconomic History  . Marital status: Married    Spouse name: None  . Number of children: None  . Years of education: None  . Highest education level: None  Social Needs  . Financial resource strain: None  . Food insecurity - worry: None  . Food insecurity - inability: None  . Transportation needs - medical: None  . Transportation needs - non-medical: None  Occupational History  . None  Tobacco Use  . Smoking status: Former Smoker    Packs/day: 1.00    Years: 2.00    Pack years: 2.00    Types: Cigarettes  . Smokeless tobacco: Never Used  Substance and Sexual Activity  . Alcohol use: No  . Drug use: No  . Sexual activity: No  Other Topics Concern  . None  Social History Narrative  . None   Family History: Family History    Problem Relation Age of Onset  . Cancer Mother        bladder  . Heart attack Father    Allergies: Allergies  Allergen Reactions  . Penicillins Nausea And Vomiting   Medications: See med rec.  Review of Systems: No fevers, chills, night sweats, weight loss, chest pain, or shortness of breath.   Objective:    General: Well Developed, well nourished, and in no acute distress.  Neuro: Alert and oriented x3, extra-ocular muscles intact, sensation grossly intact.  HEENT: Normocephalic, atraumatic, pupils equal round reactive to light, neck supple, no masses, no lymphadenopathy, thyroid nonpalpable.  Skin: Warm and dry, no rashes. Cardiac: Regular rate and rhythm, no murmurs rubs or gallops, no lower extremity edema.  Respiratory: Clear to auscultation bilaterally. Not using accessory muscles, speaking in full sentences. Abdomen: Soft, tender to palpation in the right flank, right costovertebral angle, moderately distended, no guarding, rigidity, rebound tenderness.  Impression and Recommendations:    Sepsis due to urinary tract infection ([place]) Recent nephrolithiasis, did have a cystoscopy, ureteral stent placement and laser stone removal. Did have some worsening renal function but improved on lab work on the first. Rechecking labs, nausea has improved. Occasional shaking chills so we are going to add blood cultures, he is clinically stable. Should we need antibiotics he does have some Cipro 500 mg tabs at the house.  Controlled type 2 diabetes  mellitus with diabetic nephropathy ([place]) Historically controlled on Trulicity, metformin, glipizide. Metformin was decreased secondary to his acute on chronic renal insufficiency. He has having some low blood sugars into the 50s, I think we should hold the glipizide at this time. Follow-up in 2 weeks. ___________________________________________ Gwen Her. Dianah Field, M.D., ABFM., CAQSM. Primary Care and [place] Instructor of [place] of [facility] of Medicine

## 2017-07-16 NOTE — Assessment & Plan Note (Addendum)
Recent nephrolithiasis, did have a cystoscopy, ureteral stent placement and laser stone removal. Did have some worsening renal function but improved on lab work on the first. Rechecking labs, nausea has improved. Occasional shaking chills so we are going to add blood cultures, he is clinically stable. Should we need antibiotics he does have some Cipro 500 mg tabs at the house.

## 2017-07-17 LAB — COMPREHENSIVE METABOLIC PANEL WITH GFR
Albumin: 3.9 g/dL (ref 3.6–5.1)
BUN: 33 mg/dL — ABNORMAL HIGH (ref 7–25)
Chloride: 110 mmol/L (ref 98–110)
Creat: 2.34 mg/dL — ABNORMAL HIGH (ref 0.70–1.18)
Globulin: 2.5 g/dL (ref 1.9–3.7)
Potassium: 4.8 mmol/L (ref 3.5–5.3)
Sodium: 145 mmol/L (ref 135–146)
Total Protein: 6.4 g/dL (ref 6.1–8.1)

## 2017-07-17 LAB — URINALYSIS
Bilirubin Urine: NEGATIVE
Glucose, UA: NEGATIVE
Ketones, ur: NEGATIVE
Nitrite: NEGATIVE
Specific Gravity, Urine: 1.021 (ref 1.001–1.03)
pH: 5.5 (ref 5.0–8.0)

## 2017-07-17 LAB — CBC WITH DIFFERENTIAL/PLATELET
Basophils Absolute: 27 cells/uL (ref 0–200)
Basophils Relative: 0.4 %
Eosinophils Absolute: 469 cells/uL (ref 15–500)
Eosinophils Relative: 7 %
HCT: 39 % (ref 38.5–50.0)
Hemoglobin: 13 g/dL — ABNORMAL LOW (ref 13.2–17.1)
Lymphs Abs: 1112 {cells}/uL (ref 850–3900)
MCH: 31.5 pg (ref 27.0–33.0)
MCHC: 33.3 g/dL (ref 32.0–36.0)
MCV: 94.4 fL (ref 80.0–100.0)
MPV: 12.6 fL — ABNORMAL HIGH (ref 7.5–12.5)
Monocytes Relative: 9.2 %
Neutro Abs: 4476 cells/uL (ref 1500–7800)
Neutrophils Relative %: 66.8 %
Platelets: 179 Thousand/uL (ref 140–400)
RBC: 4.13 Million/uL — ABNORMAL LOW (ref 4.20–5.80)
RDW: 12.2 % (ref 11.0–15.0)
Total Lymphocyte: 16.6 %
WBC mixed population: 616 {cells}/uL (ref 200–950)
WBC: 6.7 Thousand/uL (ref 3.8–10.8)

## 2017-07-17 LAB — COMPREHENSIVE METABOLIC PANEL
AG Ratio: 1.6 (calc) (ref 1.0–2.5)
ALT: 19 U/L (ref 9–46)
AST: 21 U/L (ref 10–35)
Alkaline phosphatase (APISO): 105 U/L (ref 40–115)
BUN/Creatinine Ratio: 14 (calc) (ref 6–22)
CO2: 26 mmol/L (ref 20–32)
Calcium: 8.8 mg/dL (ref 8.6–10.3)
Glucose, Bld: 88 mg/dL (ref 65–99)
Total Bilirubin: 0.5 mg/dL (ref 0.2–1.2)

## 2017-07-17 LAB — URINE CULTURE
MICRO NUMBER:: 90295296
SPECIMEN QUALITY:: ADEQUATE

## 2017-07-20 ENCOUNTER — Ambulatory Visit: Payer: Medicare Other | Admitting: Sports Medicine

## 2017-07-22 ENCOUNTER — Telehealth: Payer: Self-pay

## 2017-07-22 NOTE — Telephone Encounter (Signed)
Pt daughter left VM stating pt is now out of his antibiotics but still has some lingering symptoms. Would like to know if he should get more antibiotics at this time. Please advise.

## 2017-07-23 MED ORDER — CIPROFLOXACIN HCL 750 MG PO TABS: 750.0000 mg | ORAL_TABLET | Freq: Two times a day (BID) | ORAL | 0 refills | Status: AC

## 2017-07-23 NOTE — Telephone Encounter (Signed)
Yes, adding another course of Cipro 750 this time.

## 2017-07-24 DIAGNOSIS — Z466 Encounter for fitting and adjustment of urinary device: Secondary | ICD-10-CM | POA: Diagnosis not present

## 2017-07-24 DIAGNOSIS — Z87442 Personal history of urinary calculi: Secondary | ICD-10-CM | POA: Diagnosis not present

## 2017-07-24 LAB — CULTURE BLOOD MANUAL
Micro Number: 90296280
Result: NO GROWTH
Specimen Quality: ADEQUATE

## 2017-07-24 NOTE — Telephone Encounter (Signed)
Pt advised.

## 2017-07-30 ENCOUNTER — Ambulatory Visit (INDEPENDENT_AMBULATORY_CARE_PROVIDER_SITE_OTHER): Payer: Medicare Other | Admitting: Sports Medicine

## 2017-07-30 ENCOUNTER — Encounter: Payer: Self-pay | Admitting: Sports Medicine

## 2017-07-30 DIAGNOSIS — N183 Chronic kidney disease, stage 3 unspecified: Secondary | ICD-10-CM

## 2017-07-30 DIAGNOSIS — E1122 Type 2 diabetes mellitus with diabetic chronic kidney disease: Secondary | ICD-10-CM | POA: Diagnosis not present

## 2017-07-30 NOTE — Assessment & Plan Note (Signed)
Acute on chronic renal insufficiency from nephrolithiasis, he does have a single kidney, he recently had his stent removed, things are feeling much better. We did a course of Cipro for infection during his course with a stent. Rechecking BMP today. Also having some significant hypoglycemia, we discontinued his glipizide and this improved considerably. Return to see me in 3 months.

## 2017-07-30 NOTE — Progress Notes (Signed)
Subjective:    CC: Follow-up  HPI: Clinton Ortiz returns, he struggled over the past several weeks, he did end up with urosepsis from nephrolithiasis, needed a ureteral stent, he more recently was diagnosed by me with a urinary tract infection and treated with antibiotics, he was having some hypoglycemic episodes on his medications, we discontinued glipizide.  He returns today feeling significantly better, his stent has been removed, he no longer has hypoglycemic episodes and his blood pressure is well controlled.  Continues to improve.  No shortness of breath, chest pain.  I reviewed the past medical history, family history, social history, surgical history, and allergies today and no changes were needed.  Please see the problem list section below in epic for further details.  Past Medical History: Past Medical History:  Diagnosis Date  . Cancer ([place])    left kidney  . Frequent urination   . Hernia   . Hyperlipidemia   . Hypertension   . Renal insufficiency   . Spondylolysis    Past Surgical History: Past Surgical History:  Procedure Laterality Date  . HERNIA REPAIR    . NEPHRECTOMY     left   Social History: Social History   Socioeconomic History  . Marital status: Married    Spouse name: Not on file  . Number of children: Not on file  . Years of education: Not on file  . Highest education level: Not on file  Occupational History  . Not on file  Social Needs  . Financial resource strain: Not on file  . Food insecurity:    Worry: Not on file    Inability: Not on file  . Transportation needs:    Medical: Not on file    Non-medical: Not on file  Tobacco Use  . Smoking status: Former Smoker    Packs/day: 1.00    Years: 2.00    Pack years: 2.00    Types: Cigarettes  . Smokeless tobacco: Never Used  Substance and Sexual Activity  . Alcohol use: No  . Drug use: No  . Sexual activity: Never  Lifestyle  . Physical activity:    Days per week: Not on file    Minutes per  session: Not on file  . Stress: Not on file  Relationships  . Social connections:    Talks on phone: Not on file    Gets together: Not on file    Attends religious service: Not on file    Active member of club or organization: Not on file    Attends meetings of clubs or organizations: Not on file    Relationship status: Not on file  Other Topics Concern  . Not on file  Social History Narrative  . Not on file   Family History: Family History  Problem Relation Age of Onset  . Cancer Mother        bladder  . Heart attack Father    Allergies: Allergies  Allergen Reactions  . Penicillins Nausea And Vomiting   Medications: See med rec.  Review of Systems: No fevers, chills, night sweats, weight loss, chest pain, or shortness of breath.   Objective:    General: Well Developed, well nourished, and in no acute distress.  Neuro: Alert and oriented x3, extra-ocular muscles intact, sensation grossly intact.  HEENT: Normocephalic, atraumatic, pupils equal round reactive to light, neck supple, no masses, no lymphadenopathy, thyroid nonpalpable.  Skin: Warm and dry, no rashes. Cardiac: Regular rate and rhythm, no murmurs rubs or gallops, no lower  extremity edema.  Respiratory: Clear to auscultation bilaterally. Not using accessory muscles, speaking in full sentences. Abdomen: Obese, but soft, nontender, normal bowel sounds, no palpable masses, no guarding, rigidity, rebound tenderness, no costovertebral angle pain.  Impression and Recommendations:    CKD stage 3 due to type 2 diabetes mellitus (HCC) Acute on chronic renal insufficiency from nephrolithiasis, he does have a single kidney, he recently had his stent removed, things are feeling much better. We did a course of Cipro for infection during his course with a stent. Rechecking BMP today. Also having some significant hypoglycemia, we discontinued his glipizide and this improved considerably. Return to see me in 3  months. ___________________________________________ Gwen Her. Dianah Field, M.D., ABFM., CAQSM. Primary Care and [place] Instructor of [place] of [facility] of Medicine

## 2017-07-31 DIAGNOSIS — E1122 Type 2 diabetes mellitus with diabetic chronic kidney disease: Secondary | ICD-10-CM | POA: Diagnosis not present

## 2017-07-31 DIAGNOSIS — N183 Chronic kidney disease, stage 3 (moderate): Secondary | ICD-10-CM | POA: Diagnosis not present

## 2017-07-31 LAB — BASIC METABOLIC PANEL
BUN: 32 mg/dL — ABNORMAL HIGH (ref 7–25)
CO2: 26 mmol/L (ref 20–32)
Calcium: 9.2 mg/dL (ref 8.6–10.3)
Chloride: 107 mmol/L (ref 98–110)
Potassium: 4.8 mmol/L (ref 3.5–5.3)
Sodium: 140 mmol/L (ref 135–146)

## 2017-07-31 LAB — BASIC METABOLIC PANEL WITH GFR
BUN/Creatinine Ratio: 16 (calc) (ref 6–22)
Creat: 2.05 mg/dL — ABNORMAL HIGH (ref 0.70–1.18)
Glucose, Bld: 116 mg/dL — ABNORMAL HIGH (ref 65–99)

## 2017-08-04 ENCOUNTER — Other Ambulatory Visit: Payer: Self-pay | Admitting: Sports Medicine

## 2017-08-04 DIAGNOSIS — K219 Gastro-esophageal reflux disease without esophagitis: Secondary | ICD-10-CM

## 2017-08-04 MED ORDER — PANTOPRAZOLE SODIUM 40 MG PO TBEC: 40.0000 mg | DELAYED_RELEASE_TABLET | Freq: Every day | ORAL | 3 refills | Status: DC

## 2017-08-05 ENCOUNTER — Other Ambulatory Visit: Payer: Self-pay | Admitting: Sports Medicine

## 2017-08-05 DIAGNOSIS — E1169 Type 2 diabetes mellitus with other specified complication: Secondary | ICD-10-CM

## 2017-08-05 DIAGNOSIS — E669 Obesity, unspecified: Secondary | ICD-10-CM

## 2017-08-22 ENCOUNTER — Other Ambulatory Visit: Payer: Self-pay | Admitting: Sports Medicine

## 2017-08-22 DIAGNOSIS — E1165 Type 2 diabetes mellitus with hyperglycemia: Principal | ICD-10-CM

## 2017-08-22 DIAGNOSIS — IMO0002 Reserved for concepts with insufficient information to code with codable children: Secondary | ICD-10-CM

## 2017-08-22 DIAGNOSIS — E1121 Type 2 diabetes mellitus with diabetic nephropathy: Secondary | ICD-10-CM

## 2017-09-14 ENCOUNTER — Other Ambulatory Visit: Payer: Self-pay | Admitting: Sports Medicine

## 2017-09-15 ENCOUNTER — Other Ambulatory Visit: Payer: Self-pay | Admitting: Sports Medicine

## 2017-09-15 DIAGNOSIS — E1165 Type 2 diabetes mellitus with hyperglycemia: Principal | ICD-10-CM

## 2017-09-15 DIAGNOSIS — IMO0002 Reserved for concepts with insufficient information to code with codable children: Secondary | ICD-10-CM

## 2017-09-15 DIAGNOSIS — E1121 Type 2 diabetes mellitus with diabetic nephropathy: Secondary | ICD-10-CM

## 2017-10-30 ENCOUNTER — Encounter: Payer: Self-pay | Admitting: Sports Medicine

## 2017-10-30 ENCOUNTER — Ambulatory Visit (INDEPENDENT_AMBULATORY_CARE_PROVIDER_SITE_OTHER): Payer: Medicare Other | Admitting: Sports Medicine

## 2017-10-30 VITALS — BP 127/85 | HR 108 | Ht 67.0 in | Wt 256.0 lb

## 2017-10-30 DIAGNOSIS — E1121 Type 2 diabetes mellitus with diabetic nephropathy: Secondary | ICD-10-CM | POA: Diagnosis not present

## 2017-10-30 LAB — POCT GLYCOSYLATED HEMOGLOBIN (HGB A1C): Hemoglobin A1C: 6.8 % — AB (ref 4.0–5.6)

## 2017-10-30 NOTE — Progress Notes (Signed)
Subjective:    CC: A1c check  HPI: Clinton Ortiz returns, he is here to check his A1c, the only changes that he has been decreased to one half of metformin at bedtime.  Continues with Trulicity and glipizide.  No complaints, no adverse effects at this point.  Recent diabetic eye exam in March of this year.  I reviewed the past medical history, family history, social history, surgical history, and allergies today and no changes were needed.  Please see the problem list section below in epic for further details.  Past Medical History: Past Medical History:  Diagnosis Date  . Cancer ([place])    left kidney  . Frequent urination   . Hernia   . Hyperlipidemia   . Hypertension   . Renal insufficiency   . Spondylolysis    Past Surgical History: Past Surgical History:  Procedure Laterality Date  . HERNIA REPAIR    . NEPHRECTOMY     left   Social History: Social History   Socioeconomic History  . Marital status: Married    Spouse name: Not on file  . Number of children: Not on file  . Years of education: Not on file  . Highest education level: Not on file  Occupational History  . Not on file  Social Needs  . Financial resource strain: Not on file  . Food insecurity:    Worry: Not on file    Inability: Not on file  . Transportation needs:    Medical: Not on file    Non-medical: Not on file  Tobacco Use  . Smoking status: Former Smoker    Packs/day: 1.00    Years: 2.00    Pack years: 2.00    Types: Cigarettes  . Smokeless tobacco: Never Used  Substance and Sexual Activity  . Alcohol use: No  . Drug use: No  . Sexual activity: Never  Lifestyle  . Physical activity:    Days per week: Not on file    Minutes per session: Not on file  . Stress: Not on file  Relationships  . Social connections:    Talks on phone: Not on file    Gets together: Not on file    Attends religious service: Not on file    Active member of club or organization: Not on file    Attends meetings of  clubs or organizations: Not on file    Relationship status: Not on file  Other Topics Concern  . Not on file  Social History Narrative  . Not on file   Family History: Family History  Problem Relation Age of Onset  . Cancer Mother        bladder  . Heart attack Father    Allergies: Allergies  Allergen Reactions  . Penicillins Nausea And Vomiting   Medications: See med rec.  Review of Systems: No fevers, chills, night sweats, weight loss, chest pain, or shortness of breath.   Objective:    General: Well Developed, well nourished, and in no acute distress.  Neuro: Alert and oriented x3, extra-ocular muscles intact, sensation grossly intact.  HEENT: Normocephalic, atraumatic, pupils equal round reactive to light, neck supple, no masses, no lymphadenopathy, thyroid nonpalpable.  Skin: Warm and dry, no rashes. Cardiac: Regular rate and rhythm, no murmurs rubs or gallops, no lower extremity edema.  Respiratory: Clear to auscultation bilaterally. Not using accessory muscles, speaking in full sentences.  Hemoglobin A1c =6.8%  Impression and Recommendations:    Controlled type 2 diabetes mellitus with diabetic nephropathy (  [place]) Well-controlled with metformin, glipizide, Trulicity. A1c 6.8%. Just had a diabetic eye exam in March. Return to see me in 6 months. ___________________________________________ Gwen Her. Dianah Field, M.D., ABFM., CAQSM. Primary Care and [place] Instructor of [place] of [facility] of Medicine

## 2017-10-30 NOTE — Assessment & Plan Note (Signed)
Well-controlled with metformin, glipizide, Trulicity. A1c 6.8%. Just had a diabetic eye exam in March. Return to see me in 6 months.

## 2017-11-05 ENCOUNTER — Encounter: Payer: Self-pay | Admitting: Sports Medicine

## 2017-11-05 ENCOUNTER — Ambulatory Visit (INDEPENDENT_AMBULATORY_CARE_PROVIDER_SITE_OTHER): Payer: Medicare Other

## 2017-11-05 ENCOUNTER — Ambulatory Visit (INDEPENDENT_AMBULATORY_CARE_PROVIDER_SITE_OTHER): Payer: Medicare Other | Admitting: Sports Medicine

## 2017-11-05 DIAGNOSIS — M4726 Other spondylosis with radiculopathy, lumbar region: Secondary | ICD-10-CM | POA: Diagnosis not present

## 2017-11-05 DIAGNOSIS — M4722 Other spondylosis with radiculopathy, cervical region: Secondary | ICD-10-CM | POA: Diagnosis not present

## 2017-11-05 DIAGNOSIS — M1811 Unilateral primary osteoarthritis of first carpometacarpal joint, right hand: Secondary | ICD-10-CM | POA: Diagnosis not present

## 2017-11-05 DIAGNOSIS — J440 Chronic obstructive pulmonary disease with acute lower respiratory infection: Secondary | ICD-10-CM

## 2017-11-05 DIAGNOSIS — M159 Polyosteoarthritis, unspecified: Secondary | ICD-10-CM

## 2017-11-05 DIAGNOSIS — W010XXA Fall on same level from slipping, tripping and stumbling without subsequent striking against object, initial encounter: Secondary | ICD-10-CM

## 2017-11-05 DIAGNOSIS — M5416 Radiculopathy, lumbar region: Secondary | ICD-10-CM

## 2017-11-05 DIAGNOSIS — M5412 Radiculopathy, cervical region: Secondary | ICD-10-CM

## 2017-11-05 DIAGNOSIS — M545 Low back pain: Secondary | ICD-10-CM | POA: Diagnosis not present

## 2017-11-05 DIAGNOSIS — M5414 Radiculopathy, thoracic region: Secondary | ICD-10-CM | POA: Diagnosis not present

## 2017-11-05 DIAGNOSIS — M4724 Other spondylosis with radiculopathy, thoracic region: Secondary | ICD-10-CM | POA: Diagnosis not present

## 2017-11-05 NOTE — Assessment & Plan Note (Signed)
Exam is overall unremarkable with the exception of some pain at the base of the right thumb, likely baseline osteoarthritis set off by the fall. Some tenderness over the C6 and C7 spinous processes, as well as lower lumbar spine, x-rays of the right hand, as well as cervical, thoracic, lumbar spine. If no better in 2 weeks we will inject the thumb.

## 2017-11-05 NOTE — Progress Notes (Signed)
Subjective:    CC: Recent fall  HPI: Clinton Ortiz is a pleasant 77 year old male, he is had 2 falls recently, both were accidental trips.  He impacted his low back, as well as his neck and shoulder.  He also has some pain at the base of the thumb.  No swelling, nothing radicular, no bowel or bladder dysfunction, saddle numbness, constitutional symptoms.  COPD: In addition he has been using both Trelegy and Anoro.  We took Anoro off of his med list sometime ago, he has been inadvertently using it.  I reviewed the past medical history, family history, social history, surgical history, and allergies today and no changes were needed.  Please see the problem list section below in epic for further details.  Past Medical History: Past Medical History:  Diagnosis Date  . Cancer ([place])    left kidney  . Frequent urination   . Hernia   . Hyperlipidemia   . Hypertension   . Renal insufficiency   . Spondylolysis    Past Surgical History: Past Surgical History:  Procedure Laterality Date  . HERNIA REPAIR    . NEPHRECTOMY     left   Social History: Social History   Socioeconomic History  . Marital status: Married    Spouse name: Not on file  . Number of children: Not on file  . Years of education: Not on file  . Highest education level: Not on file  Occupational History  . Not on file  Social Needs  . Financial resource strain: Not on file  . Food insecurity:    Worry: Not on file    Inability: Not on file  . Transportation needs:    Medical: Not on file    Non-medical: Not on file  Tobacco Use  . Smoking status: Former Smoker    Packs/day: 1.00    Years: 2.00    Pack years: 2.00    Types: Cigarettes  . Smokeless tobacco: Never Used  Substance and Sexual Activity  . Alcohol use: No  . Drug use: No  . Sexual activity: Never  Lifestyle  . Physical activity:    Days per week: Not on file    Minutes per session: Not on file  . Stress: Not on file  Relationships  . Social  connections:    Talks on phone: Not on file    Gets together: Not on file    Attends religious service: Not on file    Active member of club or organization: Not on file    Attends meetings of clubs or organizations: Not on file    Relationship status: Not on file  Other Topics Concern  . Not on file  Social History Narrative  . Not on file   Family History: Family History  Problem Relation Age of Onset  . Cancer Mother        bladder  . Heart attack Father    Allergies: Allergies  Allergen Reactions  . Penicillins Nausea And Vomiting   Medications: See med rec.  Review of Systems: No fevers, chills, night sweats, weight loss, chest pain, or shortness of breath.   Objective:    General: Well Developed, well nourished, and in no acute distress.  Neuro: Alert and oriented x3, extra-ocular muscles intact, sensation grossly intact.  HEENT: Normocephalic, atraumatic, pupils equal round reactive to light, neck supple, no masses, no lymphadenopathy, thyroid nonpalpable.  Skin: Warm and dry, no rashes. Cardiac: Regular rate and rhythm, no murmurs rubs or gallops, no lower  extremity edema.  Respiratory: Clear to auscultation bilaterally. Not using accessory muscles, speaking in full sentences. Neck: Negative spurling's Full neck range of motion Grip strength and sensation normal in bilateral hands Strength good C4 to T1 distribution No sensory change to C4 to T1 Reflexes normal Tender to palpation over the C7 spinous process Back Exam:  Inspection: Unremarkable  Motion: Flexion 45 deg, Extension 45 deg, Side Bending to 45 deg bilaterally,  Rotation to 45 deg bilaterally  SLR laying: Negative  XSLR laying: Negative  Palpable tenderness: Tender palpation left and right sacroiliac joints. FABER: negative. Sensory change: Gross sensation intact to all lumbar and sacral dermatomes.  Reflexes: 2+ at both patellar tendons, 2+ at achilles tendons, Babinski's downgoing.  Strength  at foot  Plantar-flexion: 5/5 Dorsi-flexion: 5/5 Eversion: 5/5 Inversion: 5/5  Leg strength  Quad: 5/5 Hamstring: 5/5 Hip flexor: 5/5 Hip abductors: 5/5  Gait unremarkable. Right wrist: Inspection normal with no visible erythema or swelling. ROM smooth and normal with good flexion and extension and ulnar/radial deviation that is symmetrical with opposite wrist. Mild to moderate tenderness over the thumb basal joint. No snuffbox tenderness. No tenderness over Canal of [place]. Strength 5/5 in all directions without pain. Negative tinel's and phalens signs. Negative Finkelstein sign. Negative Watson's test.  Impression and Recommendations:    Fall from slip, trip, or stumble Exam is overall unremarkable with the exception of some pain at the base of the right thumb, likely baseline osteoarthritis set off by the fall. Some tenderness over the C6 and C7 spinous processes, as well as lower lumbar spine, x-rays of the right hand, as well as cervical, thoracic, lumbar spine. If no better in 2 weeks we will inject the thumb.  COPD (chronic obstructive pulmonary disease) Patient was accidentally using both Anoro and Trelegy together, education performed.  ___________________________________________ Gwen Her. Dianah Field, M.D., ABFM., CAQSM. Primary Care and [place] Instructor of [place] of [facility] of Medicine

## 2017-11-05 NOTE — Assessment & Plan Note (Signed)
Patient was accidentally using both Anoro and Trelegy together, education performed.

## 2017-11-19 ENCOUNTER — Encounter: Payer: Self-pay | Admitting: Sports Medicine

## 2017-11-19 ENCOUNTER — Ambulatory Visit (INDEPENDENT_AMBULATORY_CARE_PROVIDER_SITE_OTHER): Payer: Medicare Other | Admitting: Sports Medicine

## 2017-11-19 DIAGNOSIS — M549 Dorsalgia, unspecified: Secondary | ICD-10-CM | POA: Diagnosis not present

## 2017-11-19 DIAGNOSIS — W010XXD Fall on same level from slipping, tripping and stumbling without subsequent striking against object, subsequent encounter: Secondary | ICD-10-CM | POA: Diagnosis not present

## 2017-11-19 NOTE — Progress Notes (Signed)
Subjective:    CC: Follow-up  HPI: Kinte had a fall a few weeks ago, he had some neck, back, lumbar spine pain and right thumb pain, x-rays were all negative for fractures, he returns today for the most part pain-free.  I reviewed the past medical history, family history, social history, surgical history, and allergies today and no changes were needed.  Please see the problem list section below in epic for further details.  Past Medical History: Past Medical History:  Diagnosis Date  . Cancer ([place])    left kidney  . Frequent urination   . Hernia   . Hyperlipidemia   . Hypertension   . Renal insufficiency   . Spondylolysis    Past Surgical History: Past Surgical History:  Procedure Laterality Date  . HERNIA REPAIR    . NEPHRECTOMY     left   Social History: Social History   Socioeconomic History  . Marital status: Married    Spouse name: Not on file  . Number of children: Not on file  . Years of education: Not on file  . Highest education level: Not on file  Occupational History  . Not on file  Social Needs  . Financial resource strain: Not on file  . Food insecurity:    Worry: Not on file    Inability: Not on file  . Transportation needs:    Medical: Not on file    Non-medical: Not on file  Tobacco Use  . Smoking status: Former Smoker    Packs/day: 1.00    Years: 2.00    Pack years: 2.00    Types: Cigarettes  . Smokeless tobacco: Never Used  Substance and Sexual Activity  . Alcohol use: No  . Drug use: No  . Sexual activity: Never  Lifestyle  . Physical activity:    Days per week: Not on file    Minutes per session: Not on file  . Stress: Not on file  Relationships  . Social connections:    Talks on phone: Not on file    Gets together: Not on file    Attends religious service: Not on file    Active member of club or organization: Not on file    Attends meetings of clubs or organizations: Not on file    Relationship status: Not on file  Other  Topics Concern  . Not on file  Social History Narrative  . Not on file   Family History: Family History  Problem Relation Age of Onset  . Cancer Mother        bladder  . Heart attack Father    Allergies: Allergies  Allergen Reactions  . Penicillins Nausea And Vomiting   Medications: See med rec.  Review of Systems: No fevers, chills, night sweats, weight loss, chest pain, or shortness of breath.   Objective:    General: Well Developed, well nourished, and in no acute distress.  Neuro: Alert and oriented x3, extra-ocular muscles intact, sensation grossly intact.  HEENT: Normocephalic, atraumatic, pupils equal round reactive to light, neck supple, no masses, no lymphadenopathy, thyroid nonpalpable.  Skin: Warm and dry, no rashes. Cardiac: Regular rate and rhythm, no murmurs rubs or gallops, no lower extremity edema.  Respiratory: Clear to auscultation bilaterally. Not using accessory muscles, speaking in full sentences.  Impression and Recommendations:    Fall from slip, trip, or stumble Doing well, neck, thoracic, lumbar pain is for the most part resolved, right hand pain is resolved, return as needed. ___________________________________________ Marcello Moores  Ferdinand Lango, M.D., ABFM., CAQSM. Primary Care and [place] Instructor of [place] of [facility] of Medicine

## 2017-11-19 NOTE — Assessment & Plan Note (Signed)
Doing well, neck, thoracic, lumbar pain is for the most part resolved, right hand pain is resolved, return as needed.

## 2017-11-23 ENCOUNTER — Other Ambulatory Visit: Payer: Self-pay | Admitting: Sports Medicine

## 2017-11-23 DIAGNOSIS — E1169 Type 2 diabetes mellitus with other specified complication: Secondary | ICD-10-CM

## 2017-11-23 DIAGNOSIS — E669 Obesity, unspecified: Secondary | ICD-10-CM

## 2017-12-21 ENCOUNTER — Encounter: Payer: Self-pay | Admitting: Emergency Medicine

## 2017-12-21 ENCOUNTER — Other Ambulatory Visit: Payer: Self-pay

## 2017-12-21 ENCOUNTER — Emergency Department (INDEPENDENT_AMBULATORY_CARE_PROVIDER_SITE_OTHER)
Admission: EM | Admit: 2017-12-21 | Discharge: 2017-12-21 | Disposition: A | Discharge status: Home / Self Care | Payer: Medicare Other | Source: Home / Self Care | Attending: Family Medicine | Admitting: Family Medicine

## 2017-12-21 DIAGNOSIS — I9589 Other hypotension: Secondary | ICD-10-CM

## 2017-12-21 DIAGNOSIS — E861 Hypovolemia: Secondary | ICD-10-CM | POA: Diagnosis not present

## 2017-12-21 DIAGNOSIS — K5900 Constipation, unspecified: Secondary | ICD-10-CM

## 2017-12-21 DIAGNOSIS — M542 Cervicalgia: Secondary | ICD-10-CM

## 2017-12-21 HISTORY — DX: Chronic obstructive pulmonary disease, unspecified: J44.9

## 2017-12-21 LAB — POCT URINALYSIS DIP (MANUAL ENTRY)
Blood, UA: NEGATIVE
GLUCOSE UA: NEGATIVE mg/dL
LEUKOCYTES UA: NEGATIVE
NITRITE UA: NEGATIVE
Protein Ur, POC: 30 mg/dL — AB
Spec Grav, UA: >=1.03 — AB (ref 1.010–1.025)
Urobilinogen, UA: 0.2 E.U./dL
pH, UA: 5.5 (ref 5.0–8.0)

## 2017-12-21 NOTE — ED Triage Notes (Signed)
Patient awoke today feeling weak; headache, took his BP and it was low; took all regular meds; his BG ac and pc were in 120s; he is constipated and no med has helped.

## 2017-12-21 NOTE — Discharge Instructions (Signed)
Increase daily fluid intake. Begin Miralax, one capful mixed in 4 to 8 oz fluid daily for about 5 days or until constipation resolves. Apply ice pack to neck for 20 to 30 minutes, 3 to 4 times daily  Continue until pain decreases. Followup with Dr. Aundria Mems if neck and shoulder pain increase.

## 2017-12-21 NOTE — ED Provider Notes (Signed)
[facility]    CSN: [id number] Arrival date & time: 12/21/17  1618     History   Chief Complaint Chief Complaint  Patient presents with  . Constipation  . Weakness  . Hypotension  . Headache    HPI Clinton Ortiz is a 77 y.o. male.   Patient reports that he awakened this morning feeling weak, with soreness in both shoulders radiating to his neck, worse with neck movement.  He denies chest pain.  He also noted constipation this morning, but denies abdominal pain and nausea/vomiting.  He performed his morning chores, but continued to feel weak.  His blood sugar measured in the 120's.  His family measured his BP at 80/70, after which his wife encourage him to drink fluids.  He then felt significantly better, and BP normalized.   He denies fevers, chills, and sweats, and no urinary symptoms. He reports that his neck and shoulder pain have improved. Patient has multiple medical problems including DM, HTN, COPD, and CKD s/p left nephrectomy for clear cell renal CA.  In February this year he experienced right nephrolithiasis resulting in urosepsis and acute renal failure.  He underwent emergent ureteral stent placement, with peri-procedural complication of failure to extubate and hypotension.  He stabilized and recovered in ICU, and his discharge creatinie was 2.29.  The history is provided by the patient, the spouse and a relative.    Past Medical History:  Diagnosis Date  . Cancer ([place])    left kidney  . COPD (chronic obstructive pulmonary disease) ([place])   . Frequent urination   . Hernia   . Hyperlipidemia   . Hypertension   . Renal insufficiency   . Spondylolysis     Patient Active Problem List   Diagnosis Date Noted  . Fall from slip, trip, or stumble 11/05/2017  . Sepsis due to urinary tract infection ([place]) 06/22/2017  . CKD stage 3 due to type 2 diabetes mellitus ([place]) 06/05/2017  . LPRD (laryngopharyngeal reflux disease) 06/04/2017  . Mass of right side of neck  09/13/2014  . COPD (chronic obstructive pulmonary disease) ([place]) 06/01/2014  . Controlled type 2 diabetes mellitus with diabetic nephropathy ([place]) 12/30/2013  . BPH (benign prostatic hypertrophy) 12/28/2013  . Obesity 12/28/2013  . Annual physical exam 02/14/2013  . Hypertension 09/02/2012  . Hyperlipidemia 09/02/2012  . History of renal cancer status post left nephrectomy 09/02/2012  . Swelling, scrotum 09/02/2012  . Lumbar degenerative disc disease 12/29/2011    Past Surgical History:  Procedure Laterality Date  . HERNIA REPAIR    . NEPHRECTOMY     left       Home Medications    Prior to Admission medications   Medication Sig Start Date End Date Taking? Authorizing Provider  acetaminophen (TYLENOL) 325 MG tablet Take 650 mg by mouth every 6 (six) hours as needed.    [provider]  Albuterol Sulfate (PROAIR RESPICLICK) 195 (90 BASE) MCG/ACT AEPB Inhale 2 puffs into the lungs every 4 (four) hours as needed. 05/26/14   Donella Stade, PA-C  aspirin 325 MG EC tablet Take 325 mg by mouth daily.    [provider]  B Complex Vitamins (B COMPLEX PO) Take by mouth daily.    [provider]  Dulaglutide (TRULICITY) 1.5 KD/3.2IZ SOPN Inject 1.5 mg into the skin once a week. NEEDS APPOINTMENT FOR FUTURE REFILLS 05/22/17   Silverio Decamp, MD  Fish Oil-Krill Oil (KRILL OIL PLUS PO) Take by mouth daily.  [provider]  Fluticasone-Umeclidin-Vilant (TRELEGY ELLIPTA) 100-62.5-25 MCG/INH AEPB Inhale 1 puff into the lungs daily. 06/04/17   Silverio Decamp, MD  furosemide (LASIX) 40 MG tablet Take 40 mg by mouth as needed. Reported on 11/29/2015    [provider]  gabapentin (NEURONTIN) 100 MG capsule TAKE 1 CAPSULE BY MOUTH THREE TIMES A DAY 08/05/17   Silverio Decamp, MD  losartan (COZAAR) 100 MG tablet TAKE 1 TABLET (100 MG TOTAL) BY MOUTH DAILY. 02/04/17   Silverio Decamp, MD  metFORMIN (GLUCOPHAGE) 1000 MG tablet  Take 500 mg by mouth 2 (two) times daily with a meal.    [provider]  metoCLOPramide (REGLAN) 10 MG tablet TAKE 1 TABLET (10 MG TOTAL) BY MOUTH 3 (THREE) TIMES DAILY BEFORE MEALS. 02/14/17   Silverio Decamp, MD  Multiple Vitamins-Minerals (MULTIVITAMIN ADULT EXTRA C) CHEW Chew by mouth.    [provider]  Multiple Vitamins-Minerals (MULTIVITAMIN ADULTS 50+ PO) Take by mouth.    [provider]  pantoprazole (PROTONIX) 40 MG tablet Take 1 tablet (40 mg total) by mouth at bedtime. 08/04/17   Silverio Decamp, MD  pravastatin (PRAVACHOL) 40 MG tablet TAKE 1 TABLET (40 MG TOTAL) BY MOUTH DAILY. 02/04/17   Silverio Decamp, MD  Probiotic Product ([employer] ADULT 50+ PROBIOTIC PO) Take by mouth daily.    [provider]  ranitidine (ZANTAC) 300 MG tablet Take 150 mg by mouth.  06/17/17   [provider]  tamsulosin (FLOMAX) 0.4 MG CAPS capsule TAKE 2 CAPSULES BY MOUTH EVERY DAY 09/15/17   Silverio Decamp, MD  traMADol (ULTRAM) 50 MG tablet TAKE 1 TABLET EVERY 6 HOURS AS NEEDED FOR PAIN 09/14/17   Silverio Decamp, MD  TRULICITY 1.5 CH/8.5ID SOPN INJECT 1.5 MG INTO THE SKIN ONCE A WEEK. NEEDS APPOINTMENT FOR FUTURE REFILLS 11/23/17   Silverio Decamp, MD    Family History Family History  Problem Relation Age of Onset  . Cancer Mother        bladder  . Heart attack Father     Social History Social History   Tobacco Use  . Smoking status: Former Smoker    Packs/day: 1.00    Years: 2.00    Pack years: 2.00    Types: Cigarettes  . Smokeless tobacco: Never Used  Substance Use Topics  . Alcohol use: No  . Drug use: No     Allergies   Penicillins   Review of Systems Review of Systems  Constitutional: Positive for fatigue. Negative for appetite change, chills, diaphoresis and fever.  HENT: Negative.   Eyes: Negative.   Respiratory: Negative.   Cardiovascular: Negative.   Gastrointestinal: Positive for  constipation. Negative for abdominal pain, nausea and vomiting.  Genitourinary: Negative.   Musculoskeletal: Positive for neck stiffness.  Skin: Negative.   Neurological: Negative for dizziness, syncope, facial asymmetry, speech difficulty, light-headedness and headaches.     Physical Exam Triage Vital Signs ED Triage Vitals [12/21/17 1656]  Enc Vitals Group     BP 96/60     Pulse Rate 87     Resp 18     Temp 97.6 F (36.4 C)     Temp Source Oral     SpO2 95 %     Weight 250 lb (113.4 kg)     Height 5\' 7"  (1.702 m)     Head Circumference      Peak Flow      Pain Score 2  Pain Loc      Pain Edu?      Excl. in [place]?    Orthostatic VS for the past 24 hrs:  BP- Lying Pulse- Lying BP- Sitting Pulse- Sitting BP- Standing at 0 minutes Pulse- Standing at 0 minutes  12/21/17 1850 109/73 83 96/63 83 94/62 98    Updated Vital Signs BP 96/60 (BP Location: Right Arm)   Pulse 87   Temp 97.6 F (36.4 C) (Oral)   Resp 18   Ht 5\' 7"  (1.702 m)   Wt 113.4 kg   SpO2 95%   BMI 39.16 kg/m   Visual Acuity Right Eye Distance:   Left Eye Distance:   Bilateral Distance:    Right Eye Near:   Left Eye Near:    Bilateral Near:     Physical Exam  Constitutional: He appears well-developed and well-nourished. He does not appear ill. No distress.  HENT:  Head: Normocephalic.  Right Ear: External ear normal.  Left Ear: External ear normal.  Nose: Nose normal.  Mouth/Throat: Oropharynx is clear and moist.  Eyes: Pupils are equal, round, and reactive to light. Conjunctivae are normal.  Neck: Normal range of motion. Neck supple.  Mild bilateral trapezius muscle tenderness to palpation.  Cardiovascular: Normal heart sounds.  Pulmonary/Chest: Breath sounds normal.  Abdominal: Bowel sounds are normal. He exhibits no mass. There is no tenderness. There is no guarding.  Musculoskeletal: He exhibits no tenderness.  Trace lower leg edema  Lymphadenopathy:    He has no cervical adenopathy.   Neurological: He is alert.  Skin: Skin is warm. He is diaphoretic.  Nursing note and vitals reviewed.    UC Treatments / Results  Labs (all labs ordered are listed, but only abnormal results are displayed) Labs Reviewed  POCT URINALYSIS DIP (MANUAL ENTRY) - Abnormal; Notable for the following components:      Result Value   Color, UA straw (*)    Bilirubin, UA small (*)    Ketones, POC UA small (15) (*)    Spec Grav, UA >=1.030 (*)    Protein Ur, POC =30 (*)    All other components within normal limits    EKG None  Radiology No results found.  Procedures Procedures (including critical care time)  Medications Ordered in UC Medications - No data to display  Initial Impression / Assessment and Plan / UC Course  I have reviewed the triage vital signs and the nursing notes.  Pertinent labs & imaging results that were available during my care of the patient were reviewed by me and considered in my medical decision making (see chart for details).    Patient appears to be mildly orthostatic; suspect secondary to decreased fluid intake today (note concentrated urine).  Patient's constipation today probably also secondary to decreased fluid intake.  Exam relatively benign. Encouraged increased fluid intake. Note improvement of patient's bilateral neck and shoulder pain earlier today (suspect cervical radiculopathy). Followup with PCP for management of chronic medical problems.   Final Clinical Impressions(s) / UC Diagnoses   Final diagnoses:  Hypotension due to hypovolemia  Constipation, unspecified constipation type  Neck pain without injury     Discharge Instructions     Increase daily fluid intake. Begin Miralax, one capful mixed in 4 to 8 oz fluid daily for about 5 days or until constipation resolves. Apply ice pack to neck for 20 to 30 minutes, 3 to 4 times daily  Continue until pain decreases. Followup with Dr. Aundria Mems if neck  and shoulder pain  increase. If symptoms become significantly worse during the night or over the weekend, proceed to the local emergency room.     ED Prescriptions    None         Kandra Nicolas, MD 12/22/17 [phone]

## 2017-12-23 ENCOUNTER — Telehealth: Payer: Self-pay

## 2017-12-23 NOTE — Telephone Encounter (Signed)
Attempted to call. VM full

## 2017-12-29 ENCOUNTER — Other Ambulatory Visit: Payer: Self-pay | Admitting: Sports Medicine

## 2017-12-31 ENCOUNTER — Other Ambulatory Visit: Payer: Self-pay | Admitting: Sports Medicine

## 2018-01-28 DIAGNOSIS — N201 Calculus of ureter: Secondary | ICD-10-CM | POA: Diagnosis not present

## 2018-02-08 ENCOUNTER — Other Ambulatory Visit: Payer: Self-pay | Admitting: Sports Medicine

## 2018-02-08 DIAGNOSIS — E1169 Type 2 diabetes mellitus with other specified complication: Secondary | ICD-10-CM

## 2018-02-08 DIAGNOSIS — E669 Obesity, unspecified: Secondary | ICD-10-CM

## 2018-02-08 DIAGNOSIS — Z23 Encounter for immunization: Secondary | ICD-10-CM | POA: Diagnosis not present

## 2018-02-09 ENCOUNTER — Other Ambulatory Visit: Payer: Self-pay | Admitting: *Deleted

## 2018-02-09 DIAGNOSIS — E1121 Type 2 diabetes mellitus with diabetic nephropathy: Secondary | ICD-10-CM

## 2018-02-09 DIAGNOSIS — IMO0002 Reserved for concepts with insufficient information to code with codable children: Secondary | ICD-10-CM

## 2018-02-09 DIAGNOSIS — E1165 Type 2 diabetes mellitus with hyperglycemia: Principal | ICD-10-CM

## 2018-02-09 MED ORDER — ONDANSETRON 8 MG PO TBDP: 8.0000 mg | ORAL_TABLET | Freq: Three times a day (TID) | ORAL | 11 refills | Status: DC | PRN

## 2018-03-28 ENCOUNTER — Other Ambulatory Visit: Payer: Self-pay | Admitting: Sports Medicine

## 2018-04-30 ENCOUNTER — Ambulatory Visit: Payer: Medicare Other | Admitting: Sports Medicine

## 2018-05-07 ENCOUNTER — Encounter: Payer: Self-pay | Admitting: Sports Medicine

## 2018-05-07 ENCOUNTER — Ambulatory Visit: Payer: Medicare Other | Admitting: Sports Medicine

## 2018-05-07 ENCOUNTER — Ambulatory Visit (INDEPENDENT_AMBULATORY_CARE_PROVIDER_SITE_OTHER): Payer: Medicare Other | Admitting: Sports Medicine

## 2018-05-07 VITALS — BP 114/73 | HR 93 | Ht 67.0 in | Wt 246.0 lb

## 2018-05-07 DIAGNOSIS — M5136 Other intervertebral disc degeneration, lumbar region: Secondary | ICD-10-CM

## 2018-05-07 DIAGNOSIS — M722 Plantar fascial fibromatosis: Secondary | ICD-10-CM

## 2018-05-07 DIAGNOSIS — IMO0002 Reserved for concepts with insufficient information to code with codable children: Secondary | ICD-10-CM

## 2018-05-07 DIAGNOSIS — M51369 Other intervertebral disc degeneration, lumbar region without mention of lumbar back pain or lower extremity pain: Secondary | ICD-10-CM

## 2018-05-07 DIAGNOSIS — E1121 Type 2 diabetes mellitus with diabetic nephropathy: Secondary | ICD-10-CM | POA: Diagnosis not present

## 2018-05-07 DIAGNOSIS — E1165 Type 2 diabetes mellitus with hyperglycemia: Secondary | ICD-10-CM | POA: Diagnosis not present

## 2018-05-07 LAB — POCT GLYCOSYLATED HEMOGLOBIN (HGB A1C): Hemoglobin A1C: 6 % — AB (ref 4.0–5.6)

## 2018-05-07 MED ORDER — TRAMADOL HCL 50 MG PO TABS: 50.0000 mg | ORAL_TABLET | Freq: Two times a day (BID) | ORAL | 3 refills | Status: DC

## 2018-05-07 NOTE — Assessment & Plan Note (Signed)
We will start with stretches and he will borrow his daughter's night splints and alternate use nightly. If insufficient improvement in a month we will do bilateral plantar fascia injections.

## 2018-05-07 NOTE — Progress Notes (Signed)
Subjective:    CC: Follow-up  HPI: Diabetes mellitus type 2: Well-controlled.  Low back pain: Multifactorial, did not respond to epidurals, never followed through with his facet joint injections.  For now he feels the tramadol provides sufficient relief.  Bilateral foot pain: Localized to the heel, right worse than left, worse with first steps in the morning.  Severe, persistent, localized without radiation.  I reviewed the past medical history, family history, social history, surgical history, and allergies today and no changes were needed.  Please see the problem list section below in epic for further details.  Past Medical History: Past Medical History:  Diagnosis Date  . Cancer ([place])    left kidney  . COPD (chronic obstructive pulmonary disease) ([place])   . Frequent urination   . Hernia   . Hyperlipidemia   . Hypertension   . Renal insufficiency   . Spondylolysis    Past Surgical History: Past Surgical History:  Procedure Laterality Date  . HERNIA REPAIR    . NEPHRECTOMY     left   Social History: Social History   Socioeconomic History  . Marital status: Married    Spouse name: Not on file  . Number of children: Not on file  . Years of education: Not on file  . Highest education level: Not on file  Occupational History  . Not on file  Social Needs  . Financial resource strain: Not on file  . Food insecurity:    Worry: Not on file    Inability: Not on file  . Transportation needs:    Medical: Not on file    Non-medical: Not on file  Tobacco Use  . Smoking status: Former Smoker    Packs/day: 1.00    Years: 2.00    Pack years: 2.00    Types: Cigarettes  . Smokeless tobacco: Never Used  Substance and Sexual Activity  . Alcohol use: No  . Drug use: No  . Sexual activity: Never  Lifestyle  . Physical activity:    Days per week: Not on file    Minutes per session: Not on file  . Stress: Not on file  Relationships  . Social connections:    Talks on  phone: Not on file    Gets together: Not on file    Attends religious service: Not on file    Active member of club or organization: Not on file    Attends meetings of clubs or organizations: Not on file    Relationship status: Not on file  Other Topics Concern  . Not on file  Social History Narrative  . Not on file   Family History: Family History  Problem Relation Age of Onset  . Cancer Mother        bladder  . Heart attack Father    Allergies: Allergies  Allergen Reactions  . Penicillins Nausea And Vomiting   Medications: See med rec.  Review of Systems: No fevers, chills, night sweats, weight loss, chest pain, or shortness of breath.   Objective:    General: Well Developed, well nourished, and in no acute distress.  Neuro: Alert and oriented x3, extra-ocular muscles intact, sensation grossly intact.  HEENT: Normocephalic, atraumatic, pupils equal round reactive to light, neck supple, no masses, no lymphadenopathy, thyroid nonpalpable.  Skin: Warm and dry, no rashes. Cardiac: Regular rate and rhythm, no murmurs rubs or gallops, no lower extremity edema.  Respiratory: Clear to auscultation bilaterally. Not using accessory muscles, speaking in full sentences. Diabetic Foot  Exam: Both feet were examined, there are no signs of ulceration or abnormal callus. Nails are unremarkable. Dorsalis pedis and posterior tibial pulses are palpable. Sensation is intact to sharp and monofilament. Shoes are of appropriate fitment. There was tenderness at the calcaneal origin of the plantar fascia right worse than left.  Impression and Recommendations:    Controlled type 2 diabetes mellitus with diabetic nephropathy (HCC) Hemoglobin A1c is down to 6%, no changes in medication regimen.  Lumbar degenerative disc disease Did not respond much to left L4-L5 interlaminar epidural. Never actually did his left-sided L3-S1 facet joint injections. Tramadol for use up to 1-2 times per day has  also provided good relief. For now we will do medical pain management with tramadol.  Plantar fasciitis, bilateral We will start with stretches and he will borrow his daughter's night splints and alternate use nightly. If insufficient improvement in a month we will do bilateral plantar fascia injections.  ___________________________________________ Gwen Her. Dianah Field, M.D., ABFM., CAQSM. Primary Care and Sports Medicine [facility]  Adjunct Professor of [place] of [facility] of Medicine

## 2018-05-07 NOTE — Assessment & Plan Note (Signed)
Did not respond much to left L4-L5 interlaminar epidural. Never actually did his left-sided L3-S1 facet joint injections. Tramadol for use up to 1-2 times per day has also provided good relief. For now we will do medical pain management with tramadol.

## 2018-05-07 NOTE — Assessment & Plan Note (Signed)
Hemoglobin A1c is down to 6%, no changes in medication regimen.

## 2018-05-07 NOTE — Patient Instructions (Signed)
Alternate night splints left foot one night in the right foot the other night. Do the home rehab exercises. Use tramadol for your back. Return in 1 month, plantar fascia injection if no better.

## 2018-05-19 ENCOUNTER — Ambulatory Visit: Payer: Medicare Other | Admitting: Osteopathic Medicine

## 2018-06-01 ENCOUNTER — Other Ambulatory Visit: Payer: Self-pay | Admitting: Sports Medicine

## 2018-06-01 DIAGNOSIS — M5136 Other intervertebral disc degeneration, lumbar region: Secondary | ICD-10-CM

## 2018-06-01 DIAGNOSIS — K219 Gastro-esophageal reflux disease without esophagitis: Secondary | ICD-10-CM

## 2018-06-01 DIAGNOSIS — M51369 Other intervertebral disc degeneration, lumbar region without mention of lumbar back pain or lower extremity pain: Secondary | ICD-10-CM

## 2018-06-01 MED ORDER — TRAMADOL HCL 50 MG PO TABS: 50.0000 mg | ORAL_TABLET | Freq: Two times a day (BID) | ORAL | 3 refills | Status: DC

## 2018-06-07 ENCOUNTER — Encounter: Payer: Self-pay | Admitting: Sports Medicine

## 2018-06-07 ENCOUNTER — Ambulatory Visit (INDEPENDENT_AMBULATORY_CARE_PROVIDER_SITE_OTHER): Payer: Medicare Other | Admitting: Sports Medicine

## 2018-06-07 DIAGNOSIS — M722 Plantar fascial fibromatosis: Secondary | ICD-10-CM | POA: Diagnosis not present

## 2018-06-07 NOTE — Progress Notes (Signed)
Subjective:    CC: Foot pain  HPI: Blase returns, he is a pleasant 78 year old male, we have been treating him conservatively for bilateral right worse than the left plantar fasciitis.  He has done night splinting, rehabilitation exercises, unfortunately continues to have significant pain on the plantar aspect of the calcaneus, right worse than left.  Localized without radiation.  I reviewed the past medical history, family history, social history, surgical history, and allergies today and no changes were needed.  Please see the problem list section below in epic for further details.  Past Medical History: Past Medical History:  Diagnosis Date  . Cancer ([place])    left kidney  . COPD (chronic obstructive pulmonary disease) ([place])   . Frequent urination   . Hernia   . Hyperlipidemia   . Hypertension   . Renal insufficiency   . Spondylolysis    Past Surgical History: Past Surgical History:  Procedure Laterality Date  . HERNIA REPAIR    . NEPHRECTOMY     left   Social History: Social History   Socioeconomic History  . Marital status: Married    Spouse name: Not on file  . Number of children: Not on file  . Years of education: Not on file  . Highest education level: Not on file  Occupational History  . Not on file  Social Needs  . Financial resource strain: Not on file  . Food insecurity:    Worry: Not on file    Inability: Not on file  . Transportation needs:    Medical: Not on file    Non-medical: Not on file  Tobacco Use  . Smoking status: Former Smoker    Packs/day: 1.00    Years: 2.00    Pack years: 2.00    Types: Cigarettes  . Smokeless tobacco: Never Used  Substance and Sexual Activity  . Alcohol use: No  . Drug use: No  . Sexual activity: Never  Lifestyle  . Physical activity:    Days per week: Not on file    Minutes per session: Not on file  . Stress: Not on file  Relationships  . Social connections:    Talks on phone: Not on file    Gets  together: Not on file    Attends religious service: Not on file    Active member of club or organization: Not on file    Attends meetings of clubs or organizations: Not on file    Relationship status: Not on file  Other Topics Concern  . Not on file  Social History Narrative  . Not on file   Family History: Family History  Problem Relation Age of Onset  . Cancer Mother        bladder  . Heart attack Father    Allergies: Allergies  Allergen Reactions  . Penicillins Nausea And Vomiting   Medications: See med rec.  Review of Systems: No fevers, chills, night sweats, weight loss, chest pain, or shortness of breath.   Objective:    General: Well Developed, well nourished, and in no acute distress.  Neuro: Alert and oriented x3, extra-ocular muscles intact, sensation grossly intact.  HEENT: Normocephalic, atraumatic, pupils equal round reactive to light, neck supple, no masses, no lymphadenopathy, thyroid nonpalpable.  Skin: Warm and dry, no rashes. Cardiac: Regular rate and rhythm, no murmurs rubs or gallops, no lower extremity edema.  Respiratory: Clear to auscultation bilaterally. Not using accessory muscles, speaking in full sentences. Right foot: No visible erythema or swelling.  Range of motion is full in all directions. Strength is 5/5 in all directions. No hallux valgus. Bilateral pes cavus No abnormal callus noted. No pain over the navicular prominence, or base of fifth metatarsal. Moderate tenderness to palpation of the calcaneal insertion of plantar fascia. No pain at the Achilles insertion. No pain over the calcaneal bursa. No pain of the retrocalcaneal bursa. No tenderness to palpation over the tarsals, metatarsals, or phalanges. No hallux rigidus or limitus. No tenderness palpation over interphalangeal joints. No pain with compression of the metatarsal heads. Neurovascularly intact distally.  Procedure: Real-time Ultrasound Guided Injection of right plantar  fascia origin Device: GE Logiq E  Verbal informed consent obtained.  Time-out conducted.  Noted no overlying erythema, induration, or other signs of local infection.  Skin prepped in a sterile fashion.  Local anesthesia: Topical Ethyl chloride.  With sterile technique and under real time ultrasound guidance: Noted significantly thickened plantar fascia, I guided the needle just deep to the origin of the calcaneus and injected 1 cc Kenalog 40, 1 cc lidocaine, 1 cc bupivacaine. Completed without difficulty  Pain immediately resolved suggesting accurate placement of the medication.  Advised to call if fevers/chills, erythema, induration, drainage, or persistent bleeding.  Images permanently stored and available for review in the ultrasound unit.  Impression: Technically successful ultrasound guided injection.  Impression and Recommendations:    Plantar fasciitis, bilateral Not much better with conservative measures, night splints. Plantar fascia injection today. Return for custom orthotic. ___________________________________________ Gwen Her. Dianah Field, M.D., ABFM., CAQSM. Primary Care and Sports Medicine [facility]  Adjunct Professor of [place] of [facility] of Medicine

## 2018-06-07 NOTE — Assessment & Plan Note (Signed)
Not much better with conservative measures, night splints. Plantar fascia injection today. Return for custom orthotic.

## 2018-06-08 DIAGNOSIS — Z85810 Personal history of malignant neoplasm of tongue: Secondary | ICD-10-CM | POA: Diagnosis not present

## 2018-06-08 DIAGNOSIS — Z08 Encounter for follow-up examination after completed treatment for malignant neoplasm: Secondary | ICD-10-CM | POA: Diagnosis not present

## 2018-06-12 ENCOUNTER — Other Ambulatory Visit: Payer: Self-pay | Admitting: Sports Medicine

## 2018-06-12 DIAGNOSIS — J440 Chronic obstructive pulmonary disease with acute lower respiratory infection: Secondary | ICD-10-CM

## 2018-06-17 ENCOUNTER — Ambulatory Visit: Payer: Medicare Other | Admitting: Sports Medicine

## 2018-06-17 DIAGNOSIS — M722 Plantar fascial fibromatosis: Secondary | ICD-10-CM

## 2018-06-17 NOTE — Assessment & Plan Note (Signed)
Custom orthotics as above, pain-free now with injection.

## 2018-06-17 NOTE — Progress Notes (Signed)
    Patient was fitted for a : standard, cushioned, semi-rigid orthotic. The orthotic was heated and afterward the patient stood on the orthotic blank positioned on the orthotic stand. The patient was positioned in subtalar neutral position and 10 degrees of ankle dorsiflexion in a weight bearing stance. After completion of molding, a stable base was applied to the orthotic blank. The blank was ground to a stable position for weight bearing. Size: 10 Base: [employer] and safety inspector and [place]: None The patient ambulated these, and they were very comfortable.  I spent 30 minutes with this patient regarding orthotic management and training, fitting, and ambulation.  ___________________________________________ Gwen Her. Dianah Field, M.D., ABFM., CAQSM. Primary Care and [place] Instructor of [place] of [facility] of Medicine

## 2018-06-18 ENCOUNTER — Encounter: Payer: Medicare Other | Admitting: Sports Medicine

## 2018-07-06 DIAGNOSIS — N183 Chronic kidney disease, stage 3 (moderate): Secondary | ICD-10-CM | POA: Diagnosis not present

## 2018-07-06 DIAGNOSIS — I10 Essential (primary) hypertension: Secondary | ICD-10-CM | POA: Diagnosis not present

## 2018-07-15 ENCOUNTER — Encounter: Payer: Self-pay | Admitting: Sports Medicine

## 2018-07-15 ENCOUNTER — Ambulatory Visit (INDEPENDENT_AMBULATORY_CARE_PROVIDER_SITE_OTHER): Payer: Medicare Other | Admitting: Sports Medicine

## 2018-07-15 DIAGNOSIS — M722 Plantar fascial fibromatosis: Secondary | ICD-10-CM

## 2018-07-15 NOTE — Progress Notes (Signed)
Subjective:    CC: Plantar fasciitis follow-up  HPI: Esa returns, a month and a half ago I injected his right plantar fascia, we made custom orthotics, he is completely pain-free.  I reviewed the past medical history, family history, social history, surgical history, and allergies today and no changes were needed.  Please see the problem list section below in epic for further details.  Past Medical History: Past Medical History:  Diagnosis Date  . Cancer ([place])    left kidney  . COPD (chronic obstructive pulmonary disease) ([place])   . Frequent urination   . Hernia   . Hyperlipidemia   . Hypertension   . Renal insufficiency   . Spondylolysis    Past Surgical History: Past Surgical History:  Procedure Laterality Date  . HERNIA REPAIR    . NEPHRECTOMY     left   Social History: Social History   Socioeconomic History  . Marital status: Married    Spouse name: Not on file  . Number of children: Not on file  . Years of education: Not on file  . Highest education level: Not on file  Occupational History  . Not on file  Social Needs  . Financial resource strain: Not on file  . Food insecurity:    Worry: Not on file    Inability: Not on file  . Transportation needs:    Medical: Not on file    Non-medical: Not on file  Tobacco Use  . Smoking status: Former Smoker    Packs/day: 1.00    Years: 2.00    Pack years: 2.00    Types: Cigarettes  . Smokeless tobacco: Never Used  Substance and Sexual Activity  . Alcohol use: No  . Drug use: No  . Sexual activity: Never  Lifestyle  . Physical activity:    Days per week: Not on file    Minutes per session: Not on file  . Stress: Not on file  Relationships  . Social connections:    Talks on phone: Not on file    Gets together: Not on file    Attends religious service: Not on file    Active member of club or organization: Not on file    Attends meetings of clubs or organizations: Not on file    Relationship status: Not  on file  Other Topics Concern  . Not on file  Social History Narrative  . Not on file   Family History: Family History  Problem Relation Age of Onset  . Cancer Mother        bladder  . Heart attack Father    Allergies: Allergies  Allergen Reactions  . Penicillins Nausea And Vomiting   Medications: See med rec.  Review of Systems: No fevers, chills, night sweats, weight loss, chest pain, or shortness of breath.   Objective:    General: Well Developed, well nourished, and in no acute distress.  Neuro: Alert and oriented x3, extra-ocular muscles intact, sensation grossly intact.  HEENT: Normocephalic, atraumatic, pupils equal round reactive to light, neck supple, no masses, no lymphadenopathy, thyroid nonpalpable.  Skin: Warm and dry, no rashes. Cardiac: Regular rate and rhythm, no murmurs rubs or gallops, no lower extremity edema.  Respiratory: Clear to auscultation bilaterally. Not using accessory muscles, speaking in full sentences.  Impression and Recommendations:    Plantar fasciitis, bilateral Symptoms completely resolved with custom orthotics and injection, return as needed. ___________________________________________ Gwen Her. Dianah Field, M.D., ABFM., CAQSM. Primary Care and [place]  [facility]  Adjunct Professor of [place] of [facility] of Medicine

## 2018-07-15 NOTE — Assessment & Plan Note (Signed)
Symptoms completely resolved with custom orthotics and injection, return as needed.

## 2018-07-29 ENCOUNTER — Other Ambulatory Visit: Payer: Self-pay | Admitting: Sports Medicine

## 2018-07-29 DIAGNOSIS — E1169 Type 2 diabetes mellitus with other specified complication: Secondary | ICD-10-CM

## 2018-07-29 DIAGNOSIS — E669 Obesity, unspecified: Secondary | ICD-10-CM

## 2018-07-29 MED ORDER — TRIAMCINOLONE ACETONIDE 55 MCG/ACT NA AERO: 2.0000 | INHALATION_SPRAY | Freq: Every day | NASAL | 11 refills | Status: DC

## 2018-07-29 MED ORDER — AZELASTINE HCL 0.1 % NA SOLN: 2.0000 | Freq: Two times a day (BID) | NASAL | 1 refills | Status: DC

## 2018-08-02 ENCOUNTER — Other Ambulatory Visit: Payer: Self-pay | Admitting: Sports Medicine

## 2018-08-20 ENCOUNTER — Other Ambulatory Visit: Payer: Self-pay | Admitting: Sports Medicine

## 2018-09-03 ENCOUNTER — Other Ambulatory Visit: Payer: Self-pay | Admitting: Sports Medicine

## 2018-09-10 ENCOUNTER — Telehealth (INDEPENDENT_AMBULATORY_CARE_PROVIDER_SITE_OTHER): Payer: Medicare Other

## 2018-09-10 DIAGNOSIS — K219 Gastro-esophageal reflux disease without esophagitis: Secondary | ICD-10-CM | POA: Diagnosis not present

## 2018-09-10 MED ORDER — PANTOPRAZOLE SODIUM 40 MG PO TBEC: 40.0000 mg | DELAYED_RELEASE_TABLET | Freq: Two times a day (BID) | ORAL | 1 refills | Status: DC

## 2018-09-10 NOTE — Telephone Encounter (Signed)
Patient's daughter advised.  

## 2018-09-10 NOTE — Telephone Encounter (Signed)
Clinton Ortiz reports the pantoprazole once daily is not helping with is acid reflux. He wanted to know if he could get a prescription for twice daily.

## 2018-09-10 NOTE — Telephone Encounter (Signed)
No problem, increased to BID.  I spent 5 total minutes of online digital evaluation and management services.

## 2018-09-18 ENCOUNTER — Other Ambulatory Visit: Payer: Self-pay | Admitting: Sports Medicine

## 2018-10-12 ENCOUNTER — Telehealth: Payer: Self-pay | Admitting: *Deleted

## 2018-10-12 NOTE — Telephone Encounter (Signed)
Pt's daughter left a vm saying that Metformin "was pulled" and wanted to know if you could send in something in it's place.  Pt is coming in for a visit tomorrow afternoon.

## 2018-10-13 ENCOUNTER — Encounter: Payer: Self-pay | Admitting: Sports Medicine

## 2018-10-13 ENCOUNTER — Ambulatory Visit (INDEPENDENT_AMBULATORY_CARE_PROVIDER_SITE_OTHER): Payer: Medicare Other | Admitting: Sports Medicine

## 2018-10-13 VITALS — BP 118/75 | HR 87 | Ht 67.0 in | Wt 252.0 lb

## 2018-10-13 DIAGNOSIS — E1121 Type 2 diabetes mellitus with diabetic nephropathy: Secondary | ICD-10-CM | POA: Diagnosis not present

## 2018-10-13 DIAGNOSIS — R7989 Other specified abnormal findings of blood chemistry: Secondary | ICD-10-CM

## 2018-10-13 LAB — POCT GLYCOSYLATED HEMOGLOBIN (HGB A1C): Hemoglobin A1C: 6.1 % — AB (ref 4.0–5.6)

## 2018-10-13 NOTE — Progress Notes (Addendum)
Subjective:    CC: Follow-up  HPI: Diabetes mellitus type 2: Doing well, off of metformin due to a recall.  No complaints.  I reviewed the past medical history, family history, social history, surgical history, and allergies today and no changes were needed.  Please see the problem list section below in epic for further details.  Past Medical History: Past Medical History:  Diagnosis Date  . Cancer ([place])    left kidney  . COPD (chronic obstructive pulmonary disease) ([place])   . Frequent urination   . Hernia   . Hyperlipidemia   . Hypertension   . Renal insufficiency   . Spondylolysis    Past Surgical History: Past Surgical History:  Procedure Laterality Date  . HERNIA REPAIR    . NEPHRECTOMY     left   Social History: Social History   Socioeconomic History  . Marital status: Married    Spouse name: Not on file  . Number of children: Not on file  . Years of education: Not on file  . Highest education level: Not on file  Occupational History  . Not on file  Social Needs  . Financial resource strain: Not on file  . Food insecurity:    Worry: Not on file    Inability: Not on file  . Transportation needs:    Medical: Not on file    Non-medical: Not on file  Tobacco Use  . Smoking status: Former Smoker    Packs/day: 1.00    Years: 2.00    Pack years: 2.00    Types: Cigarettes  . Smokeless tobacco: Never Used  Substance and Sexual Activity  . Alcohol use: No  . Drug use: No  . Sexual activity: Never  Lifestyle  . Physical activity:    Days per week: Not on file    Minutes per session: Not on file  . Stress: Not on file  Relationships  . Social connections:    Talks on phone: Not on file    Gets together: Not on file    Attends religious service: Not on file    Active member of club or organization: Not on file    Attends meetings of clubs or organizations: Not on file    Relationship status: Not on file  Other Topics Concern  . Not on file  Social  History Narrative  . Not on file   Family History: Family History  Problem Relation Age of Onset  . Cancer Mother        bladder  . Heart attack Father    Allergies: Allergies  Allergen Reactions  . Penicillins Nausea And Vomiting   Medications: See med rec.  Review of Systems: No fevers, chills, night sweats, weight loss, chest pain, or shortness of breath.   Objective:    General: Well Developed, well nourished, and in no acute distress.  Neuro: Alert and oriented x3, extra-ocular muscles intact, sensation grossly intact.  HEENT: Normocephalic, atraumatic, pupils equal round reactive to light, neck supple, no masses, no lymphadenopathy, thyroid nonpalpable.  Skin: Warm and dry, no rashes. Cardiac: Regular rate and rhythm, no murmurs rubs or gallops, no lower extremity edema.  Respiratory: Clear to auscultation bilaterally. Not using accessory muscles, speaking in full sentences.  Impression and Recommendations:    Controlled type 2 diabetes mellitus with diabetic nephropathy (HCC) A1c 6.1%, off of metformin for the past week. I think we can continue to hold metformin, he did have intractable diarrhea. We do need to check all  of his routine labs.  Elevated TSH Adding T3 and T4 levels  I spent 25 minutes with this patient, greater than 50% was face-to-face time counseling regarding the above diagnoses.  ___________________________________________ Gwen Her. Dianah Field, M.D., ABFM., CAQSM. Primary Care and Sports Medicine [facility]  Adjunct Professor of [place] of [facility] of Medicine

## 2018-10-13 NOTE — Assessment & Plan Note (Signed)
A1c 6.1%, off of metformin for the past week. I think we can continue to hold metformin, he did have intractable diarrhea. We do need to check all of his routine labs.

## 2018-10-19 DIAGNOSIS — E1121 Type 2 diabetes mellitus with diabetic nephropathy: Secondary | ICD-10-CM | POA: Diagnosis not present

## 2018-10-19 DIAGNOSIS — R7989 Other specified abnormal findings of blood chemistry: Secondary | ICD-10-CM | POA: Diagnosis not present

## 2018-10-20 DIAGNOSIS — E038 Other specified hypothyroidism: Secondary | ICD-10-CM | POA: Insufficient documentation

## 2018-10-20 DIAGNOSIS — E039 Hypothyroidism, unspecified: Secondary | ICD-10-CM | POA: Insufficient documentation

## 2018-10-20 NOTE — Addendum Note (Signed)
Addended by: Silverio Decamp on: 10/20/2018 09:09 AM   Modules accepted: Orders

## 2018-10-20 NOTE — Assessment & Plan Note (Signed)
Adding T3 and T4 levels. 

## 2018-10-21 ENCOUNTER — Other Ambulatory Visit: Payer: Self-pay | Admitting: Sports Medicine

## 2018-10-22 ENCOUNTER — Encounter: Payer: Self-pay | Admitting: Sports Medicine

## 2018-10-22 LAB — CBC
HCT: 47 % (ref 38.5–50.0)
Hemoglobin: 15.7 g/dL (ref 13.2–17.1)
MCH: 32.4 pg (ref 27.0–33.0)
MCHC: 33.4 g/dL (ref 32.0–36.0)
MCV: 96.9 fL (ref 80.0–100.0)
MPV: 12.8 fL — ABNORMAL HIGH (ref 7.5–12.5)
Platelets: 166 10*3/uL (ref 140–400)
RBC: 4.85 10*6/uL (ref 4.20–5.80)
RDW: 12.6 % (ref 11.0–15.0)
WBC: 6.1 10*3/uL (ref 3.8–10.8)

## 2018-10-22 LAB — TSH: TSH: 4.72 mIU/L — ABNORMAL HIGH (ref 0.40–4.50)

## 2018-10-22 LAB — COMPLETE METABOLIC PANEL WITH GFR
AG Ratio: 1.7 (calc) (ref 1.0–2.5)
ALT: 43 U/L (ref 9–46)
AST: 32 U/L (ref 10–35)
Albumin: 4.1 g/dL (ref 3.6–5.1)
Alkaline phosphatase (APISO): 113 U/L (ref 35–144)
BUN/Creatinine Ratio: 13 (calc) (ref 6–22)
BUN: 28 mg/dL — ABNORMAL HIGH (ref 7–25)
CO2: 27 mmol/L (ref 20–32)
Calcium: 9.2 mg/dL (ref 8.6–10.3)
Chloride: 108 mmol/L (ref 98–110)
Creat: 2.09 mg/dL — ABNORMAL HIGH (ref 0.70–1.18)
GFR, Est African American: 34 mL/min/{1.73_m2} — ABNORMAL LOW (ref >=60)
GFR, Est Non African American: 30 mL/min/{1.73_m2} — ABNORMAL LOW (ref >=60)
Globulin: 2.4 g/dL (calc) (ref 1.9–3.7)
Glucose, Bld: 141 mg/dL — ABNORMAL HIGH (ref 65–99)
Potassium: 5.5 mmol/L — ABNORMAL HIGH (ref 3.5–5.3)
Sodium: 143 mmol/L (ref 135–146)
Total Bilirubin: 0.5 mg/dL (ref 0.2–1.2)
Total Protein: 6.5 g/dL (ref 6.1–8.1)

## 2018-10-22 LAB — LIPID PANEL W/REFLEX DIRECT LDL
Cholesterol: 150 mg/dL (ref <200)
HDL: 35 mg/dL — ABNORMAL LOW (ref >=40)
LDL Cholesterol (Calc): 84 mg/dL (calc)
Non-HDL Cholesterol (Calc): 115 mg/dL (calc) (ref <130)
Total CHOL/HDL Ratio: 4.3 (calc) (ref <5.0)
Triglycerides: 214 mg/dL — ABNORMAL HIGH (ref <150)

## 2018-10-22 LAB — T3, FREE: T3, Free: 2.6 pg/mL (ref 2.3–4.2)

## 2018-10-22 LAB — TEST AUTHORIZATION

## 2018-10-22 LAB — T4, FREE: Free T4: 1 ng/dL (ref 0.8–1.8)

## 2018-11-03 ENCOUNTER — Other Ambulatory Visit: Payer: Self-pay | Admitting: Sports Medicine

## 2018-11-03 DIAGNOSIS — M51369 Other intervertebral disc degeneration, lumbar region without mention of lumbar back pain or lower extremity pain: Secondary | ICD-10-CM

## 2018-11-03 DIAGNOSIS — M5136 Other intervertebral disc degeneration, lumbar region: Secondary | ICD-10-CM

## 2018-11-12 ENCOUNTER — Other Ambulatory Visit: Payer: Self-pay | Admitting: Sports Medicine

## 2018-11-26 ENCOUNTER — Other Ambulatory Visit: Payer: Self-pay | Admitting: Sports Medicine

## 2018-11-29 ENCOUNTER — Other Ambulatory Visit: Payer: Self-pay | Admitting: *Deleted

## 2018-11-29 DIAGNOSIS — E1169 Type 2 diabetes mellitus with other specified complication: Secondary | ICD-10-CM

## 2018-11-29 DIAGNOSIS — E669 Obesity, unspecified: Secondary | ICD-10-CM

## 2018-11-29 MED ORDER — TRULICITY 1.5 MG/0.5ML ~~LOC~~ SOAJ: 1.5000 mg | SUBCUTANEOUS | 2 refills | Status: DC

## 2018-12-08 ENCOUNTER — Telehealth: Payer: Self-pay | Admitting: Family Medicine

## 2018-12-08 DIAGNOSIS — E669 Obesity, unspecified: Secondary | ICD-10-CM

## 2018-12-08 DIAGNOSIS — E1169 Type 2 diabetes mellitus with other specified complication: Secondary | ICD-10-CM

## 2018-12-08 DIAGNOSIS — E1122 Type 2 diabetes mellitus with diabetic chronic kidney disease: Secondary | ICD-10-CM

## 2018-12-08 MED ORDER — VALSARTAN 160 MG PO TABS: 160.0000 mg | ORAL_TABLET | Freq: Every day | ORAL | 3 refills | Status: DC

## 2018-12-08 NOTE — Telephone Encounter (Signed)
I am covering for Dr. Dianah Field while he is away.  I received a fax from [place] stating that your blood pressure medication losartan is currently on back order and not available.  Plan to substitute for a similar medication in the same class called valsartan.  There are other equivalent medications as well.  If you have any trouble getting the medication or have any problems with the medication please let us know.  Will want to reassess blood pressure and kidney function in the near future.  If he have the ability to check her own blood pressure at home please start doing that.  Additionally I will place an order for kidney labs which should be done about 2 weeks after starting the new medication.  Because you have kidney disease we want to be very careful with switching blood pressure medications.  If you have any issues or questions please let me know.  Lynne Leader, MD

## 2018-12-15 NOTE — Telephone Encounter (Signed)
Called patient and mail box is full. Try again later. KG LPN

## 2018-12-21 NOTE — Telephone Encounter (Signed)
Called and advised patient of information and changes. Patient advised me that his daughter is the one who picks up and arranges his medications, Ivin Booty, needs to be advised.  I have called and left pt's daughter a detailed msg about medication changes and info for blood work.   My direct call back information was provided in case of any questions or concerns.

## 2019-01-13 ENCOUNTER — Ambulatory Visit: Payer: Medicare Other | Admitting: Sports Medicine

## 2019-01-16 ENCOUNTER — Other Ambulatory Visit: Payer: Self-pay | Admitting: Sports Medicine

## 2019-01-16 DIAGNOSIS — E1169 Type 2 diabetes mellitus with other specified complication: Secondary | ICD-10-CM

## 2019-01-16 DIAGNOSIS — E669 Obesity, unspecified: Secondary | ICD-10-CM

## 2019-01-18 ENCOUNTER — Other Ambulatory Visit: Payer: Self-pay

## 2019-01-18 ENCOUNTER — Encounter: Payer: Self-pay | Admitting: Sports Medicine

## 2019-01-18 ENCOUNTER — Ambulatory Visit (INDEPENDENT_AMBULATORY_CARE_PROVIDER_SITE_OTHER): Payer: Medicare Other | Admitting: Sports Medicine

## 2019-01-18 VITALS — BP 112/73 | HR 93 | Ht 67.0 in | Wt 264.0 lb

## 2019-01-18 DIAGNOSIS — E1169 Type 2 diabetes mellitus with other specified complication: Secondary | ICD-10-CM

## 2019-01-18 DIAGNOSIS — Z0000 Encounter for general adult medical examination without abnormal findings: Secondary | ICD-10-CM

## 2019-01-18 DIAGNOSIS — Z23 Encounter for immunization: Secondary | ICD-10-CM | POA: Diagnosis not present

## 2019-01-18 DIAGNOSIS — E1121 Type 2 diabetes mellitus with diabetic nephropathy: Secondary | ICD-10-CM

## 2019-01-18 DIAGNOSIS — E669 Obesity, unspecified: Secondary | ICD-10-CM

## 2019-01-18 LAB — POCT GLYCOSYLATED HEMOGLOBIN (HGB A1C): Hemoglobin A1C: 7 % — AB (ref 4.0–5.6)

## 2019-01-18 MED ORDER — GABAPENTIN 300 MG PO CAPS: ORAL_CAPSULE | ORAL | 3 refills | Status: DC

## 2019-01-18 NOTE — Progress Notes (Signed)
Subjective:    CC: Follow-up  HPI: Diabetes mellitus type 2: Well-controlled with a hemoglobin A1c of 7, he is due for an eye exam.  He is having some increasing peripheral neuropathy, he describes this as an itching, and on further questioning tingling of his hands and feet.  He is currently taking gabapentin 100 mg in the morning and 200 at bedtime.  I reviewed the past medical history, family history, social history, surgical history, and allergies today and no changes were needed.  Please see the problem list section below in epic for further details.  Past Medical History: Past Medical History:  Diagnosis Date  . Cancer ([place])    left kidney  . COPD (chronic obstructive pulmonary disease) ([place])   . Frequent urination   . Hernia   . Hyperlipidemia   . Hypertension   . Renal insufficiency   . Spondylolysis    Past Surgical History: Past Surgical History:  Procedure Laterality Date  . HERNIA REPAIR    . NEPHRECTOMY     left   Social History: Social History   Socioeconomic History  . Marital status: Married    Spouse name: Not on file  . Number of children: Not on file  . Years of education: Not on file  . Highest education level: Not on file  Occupational History  . Not on file  Social Needs  . Financial resource strain: Not on file  . Food insecurity    Worry: Not on file    Inability: Not on file  . Transportation needs    Medical: Not on file    Non-medical: Not on file  Tobacco Use  . Smoking status: Former Smoker    Packs/day: 1.00    Years: 2.00    Pack years: 2.00    Types: Cigarettes  . Smokeless tobacco: Never Used  Substance and Sexual Activity  . Alcohol use: No  . Drug use: No  . Sexual activity: Never  Lifestyle  . Physical activity    Days per week: Not on file    Minutes per session: Not on file  . Stress: Not on file  Relationships  . Social Herbalist on phone: Not on file    Gets together: Not on file    Attends  religious service: Not on file    Active member of club or organization: Not on file    Attends meetings of clubs or organizations: Not on file    Relationship status: Not on file  Other Topics Concern  . Not on file  Social History Narrative  . Not on file   Family History: Family History  Problem Relation Age of Onset  . Cancer Mother        bladder  . Heart attack Father    Allergies: Allergies  Allergen Reactions  . Penicillins Nausea And Vomiting   Medications: See med rec.  Review of Systems: No fevers, chills, night sweats, weight loss, chest pain, or shortness of breath.   Objective:    General: Well Developed, well nourished, and in no acute distress.  Neuro: Alert and oriented x3, extra-ocular muscles intact, sensation grossly intact.  HEENT: Normocephalic, atraumatic, pupils equal round reactive to light, neck supple, no masses, no lymphadenopathy, thyroid nonpalpable.  Skin: Warm and dry, no rashes. Cardiac: Regular rate and rhythm, no murmurs rubs or gallops, no lower extremity edema.  Respiratory: Clear to auscultation bilaterally. Not using accessory muscles, speaking in full sentences.  Impression and  Recommendations:    Annual physical exam Flu shot given today.  Controlled type 2 diabetes mellitus with diabetic nephropathy ([place]) Controlled, he does have some increasing peripheral neuropathy, increasing gabapentin from 100 mg in the morning and 200 at bedtime increased to 300 in the morning and 600 at bedtime. Due for eye exam.  I spent 25 minutes with this patient, greater than 50% was face-to-face time counseling regarding the above diagnoses.  ___________________________________________ Gwen Her. Dianah Field, M.D., ABFM., CAQSM. Primary Care and Sports Medicine [place]  Adjunct Professor of [place] of [facility] of Medicine

## 2019-01-18 NOTE — Assessment & Plan Note (Signed)
Controlled, he does have some increasing peripheral neuropathy, increasing gabapentin from 100 mg in the morning and 200 at bedtime increased to 300 in the morning and 600 at bedtime. Due for eye exam.

## 2019-01-18 NOTE — Assessment & Plan Note (Signed)
Flu shot given today

## 2019-01-28 ENCOUNTER — Other Ambulatory Visit: Payer: Self-pay | Admitting: Sports Medicine

## 2019-01-28 DIAGNOSIS — E1121 Type 2 diabetes mellitus with diabetic nephropathy: Secondary | ICD-10-CM

## 2019-01-28 DIAGNOSIS — IMO0002 Reserved for concepts with insufficient information to code with codable children: Secondary | ICD-10-CM

## 2019-01-28 MED ORDER — ONDANSETRON 8 MG PO TBDP: 8.0000 mg | ORAL_TABLET | Freq: Three times a day (TID) | ORAL | 11 refills | Status: DC | PRN

## 2019-02-02 DIAGNOSIS — H6123 Impacted cerumen, bilateral: Secondary | ICD-10-CM | POA: Diagnosis not present

## 2019-02-02 DIAGNOSIS — H903 Sensorineural hearing loss, bilateral: Secondary | ICD-10-CM | POA: Diagnosis not present

## 2019-02-02 DIAGNOSIS — H838X3 Other specified diseases of inner ear, bilateral: Secondary | ICD-10-CM | POA: Diagnosis not present

## 2019-03-04 ENCOUNTER — Other Ambulatory Visit: Payer: Self-pay | Admitting: *Deleted

## 2019-03-04 MED ORDER — PRAVASTATIN SODIUM 40 MG PO TABS: ORAL_TABLET | ORAL | 3 refills | Status: DC

## 2019-03-07 ENCOUNTER — Other Ambulatory Visit: Payer: Self-pay | Admitting: Sports Medicine

## 2019-03-07 MED ORDER — LOSARTAN POTASSIUM 100 MG PO TABS: 100.0000 mg | ORAL_TABLET | Freq: Every day | ORAL | 3 refills | Status: DC

## 2019-03-11 ENCOUNTER — Other Ambulatory Visit: Payer: Self-pay | Admitting: Sports Medicine

## 2019-03-11 DIAGNOSIS — K219 Gastro-esophageal reflux disease without esophagitis: Secondary | ICD-10-CM

## 2019-03-11 MED ORDER — PANTOPRAZOLE SODIUM 40 MG PO TBEC: 40.0000 mg | DELAYED_RELEASE_TABLET | Freq: Two times a day (BID) | ORAL | 1 refills | Status: DC

## 2019-04-06 ENCOUNTER — Other Ambulatory Visit: Payer: Self-pay

## 2019-04-13 ENCOUNTER — Encounter: Payer: Self-pay | Admitting: Sports Medicine

## 2019-04-13 ENCOUNTER — Other Ambulatory Visit: Payer: Self-pay

## 2019-04-13 ENCOUNTER — Ambulatory Visit (INDEPENDENT_AMBULATORY_CARE_PROVIDER_SITE_OTHER): Payer: Medicare Other | Admitting: Sports Medicine

## 2019-04-13 VITALS — BP 133/85 | HR 82 | Ht 67.0 in | Wt 271.0 lb

## 2019-04-13 DIAGNOSIS — E669 Obesity, unspecified: Secondary | ICD-10-CM | POA: Diagnosis not present

## 2019-04-13 DIAGNOSIS — E1169 Type 2 diabetes mellitus with other specified complication: Secondary | ICD-10-CM | POA: Diagnosis not present

## 2019-04-13 DIAGNOSIS — E1121 Type 2 diabetes mellitus with diabetic nephropathy: Secondary | ICD-10-CM

## 2019-04-13 LAB — POCT GLYCOSYLATED HEMOGLOBIN (HGB A1C): Hemoglobin A1C: 9.2 % — AB (ref 4.0–5.6)

## 2019-04-13 MED ORDER — TRULICITY 3 MG/0.5ML ~~LOC~~ SOAJ: 1.0000 "pen " | SUBCUTANEOUS | 11 refills | Status: DC

## 2019-04-13 NOTE — Progress Notes (Signed)
Subjective:    CC: Diabetes follow-up  HPI: Clinton Ortiz is a very pleasant 78 year old male, historically his diabetes has been well controlled with Trulicity 1.5 mg, more recently he is noted increasing blood sugars, he has also gained some weight.  I reviewed the past medical history, family history, social history, surgical history, and allergies today and no changes were needed.  Please see the problem list section below in epic for further details.  Past Medical History: Past Medical History:  Diagnosis Date  . Cancer ([place])    left kidney  . COPD (chronic obstructive pulmonary disease) ([place])   . Frequent urination   . Hernia   . Hyperlipidemia   . Hypertension   . Renal insufficiency   . Spondylolysis    Past Surgical History: Past Surgical History:  Procedure Laterality Date  . HERNIA REPAIR    . NEPHRECTOMY     left   Social History: Social History   Socioeconomic History  . Marital status: Married    Spouse name: Not on file  . Number of children: Not on file  . Years of education: Not on file  . Highest education level: Not on file  Occupational History  . Not on file  Social Needs  . Financial resource strain: Not on file  . Food insecurity    Worry: Not on file    Inability: Not on file  . Transportation needs    Medical: Not on file    Non-medical: Not on file  Tobacco Use  . Smoking status: Former Smoker    Packs/day: 1.00    Years: 2.00    Pack years: 2.00    Types: Cigarettes  . Smokeless tobacco: Never Used  Substance and Sexual Activity  . Alcohol use: No  . Drug use: No  . Sexual activity: Never  Lifestyle  . Physical activity    Days per week: Not on file    Minutes per session: Not on file  . Stress: Not on file  Relationships  . Social Herbalist on phone: Not on file    Gets together: Not on file    Attends religious service: Not on file    Active member of club or organization: Not on file    Attends meetings of clubs  or organizations: Not on file    Relationship status: Not on file  Other Topics Concern  . Not on file  Social History Narrative  . Not on file   Family History: Family History  Problem Relation Age of Onset  . Cancer Mother        bladder  . Heart attack Father    Allergies: Allergies  Allergen Reactions  . Penicillins Nausea And Vomiting   Medications: See med rec.  Review of Systems: No fevers, chills, night sweats, weight loss, chest pain, or shortness of breath.   Objective:    General: Well Developed, well nourished, and in no acute distress.  Neuro: Alert and oriented x3, extra-ocular muscles intact, sensation grossly intact.  HEENT: Normocephalic, atraumatic, pupils equal round reactive to light, neck supple, no masses, no lymphadenopathy, thyroid nonpalpable.  Skin: Warm and dry, no rashes. Cardiac: Regular rate and rhythm, no murmurs rubs or gallops, no lower extremity edema.  Respiratory: Clear to auscultation bilaterally. Not using accessory muscles, speaking in full sentences.  Impression and Recommendations:    Controlled type 2 diabetes mellitus with diabetic nephropathy ([place]) Diabetes has worsened, there has been significant weight gain. Increasing Trulicity  to 3 mg. Recheck in 3 months. We can go up to a maximum of Trulicity 4.5.  I spent 25 minutes with this patient, greater than 50% was face-to-face time counseling regarding the above diagnoses.  ___________________________________________ Gwen Her. Dianah Field, M.D., ABFM., CAQSM. Primary Care and Sports Medicine [facility]  Adjunct Professor of [place] of [facility] of Medicine

## 2019-04-13 NOTE — Assessment & Plan Note (Signed)
Diabetes has worsened, there has been significant weight gain. Increasing Trulicity to 3 mg. Recheck in 3 months. We can go up to a maximum of Trulicity 4.5.

## 2019-04-19 ENCOUNTER — Ambulatory Visit: Payer: Medicare Other | Admitting: Sports Medicine

## 2019-05-15 DIAGNOSIS — M79602 Pain in left arm: Secondary | ICD-10-CM | POA: Diagnosis not present

## 2019-05-15 DIAGNOSIS — Z91010 Allergy to peanuts: Secondary | ICD-10-CM | POA: Diagnosis not present

## 2019-05-15 DIAGNOSIS — R079 Chest pain, unspecified: Secondary | ICD-10-CM | POA: Diagnosis not present

## 2019-05-15 DIAGNOSIS — E785 Hyperlipidemia, unspecified: Secondary | ICD-10-CM | POA: Diagnosis not present

## 2019-05-15 DIAGNOSIS — Z79899 Other long term (current) drug therapy: Secondary | ICD-10-CM | POA: Diagnosis not present

## 2019-05-15 DIAGNOSIS — Z880 Allergy status to penicillin: Secondary | ICD-10-CM | POA: Diagnosis not present

## 2019-05-15 DIAGNOSIS — Z87891 Personal history of nicotine dependence: Secondary | ICD-10-CM | POA: Diagnosis not present

## 2019-05-15 DIAGNOSIS — I10 Essential (primary) hypertension: Secondary | ICD-10-CM | POA: Diagnosis not present

## 2019-05-15 DIAGNOSIS — J449 Chronic obstructive pulmonary disease, unspecified: Secondary | ICD-10-CM | POA: Diagnosis not present

## 2019-05-15 DIAGNOSIS — Z888 Allergy status to other drugs, medicaments and biological substances status: Secondary | ICD-10-CM | POA: Diagnosis not present

## 2019-05-15 DIAGNOSIS — E119 Type 2 diabetes mellitus without complications: Secondary | ICD-10-CM | POA: Diagnosis not present

## 2019-05-15 DIAGNOSIS — I2510 Atherosclerotic heart disease of native coronary artery without angina pectoris: Secondary | ICD-10-CM | POA: Diagnosis not present

## 2019-05-15 DIAGNOSIS — R0789 Other chest pain: Secondary | ICD-10-CM | POA: Diagnosis not present

## 2019-05-16 ENCOUNTER — Telehealth: Payer: Self-pay | Admitting: Sports Medicine

## 2019-05-16 NOTE — Telephone Encounter (Signed)
Patient was seen at [facility] in [place]. This is for elevated Blood sugar and all the symptoms of a heart attack. Needs to be seen this week for a follow up. Nothing open in appropriate time slot. Please advise.

## 2019-05-16 NOTE — Telephone Encounter (Addendum)
Whatchu talking about [name]?  I have openings Wednesday, Thursday, and Friday.  If this is just a follow-up from a visit to the ED and he was not even admitted I can do it 15 minutes like most of my hospital follow-ups.

## 2019-05-16 NOTE — Telephone Encounter (Signed)
Appointment has been made. Thank you.

## 2019-05-18 ENCOUNTER — Encounter: Payer: Self-pay | Admitting: Sports Medicine

## 2019-05-18 ENCOUNTER — Other Ambulatory Visit: Payer: Self-pay

## 2019-05-18 ENCOUNTER — Ambulatory Visit (INDEPENDENT_AMBULATORY_CARE_PROVIDER_SITE_OTHER): Payer: Medicare Other | Admitting: Sports Medicine

## 2019-05-18 DIAGNOSIS — R079 Chest pain, unspecified: Secondary | ICD-10-CM | POA: Diagnosis not present

## 2019-05-18 DIAGNOSIS — E1121 Type 2 diabetes mellitus with diabetic nephropathy: Secondary | ICD-10-CM | POA: Diagnosis not present

## 2019-05-18 MED ORDER — FUROSEMIDE 40 MG PO TABS: 40.0000 mg | ORAL_TABLET | Freq: Every day | ORAL | 3 refills | Status: DC

## 2019-05-18 MED ORDER — NITROGLYCERIN 0.4 MG SL SUBL: 0.4000 mg | SUBLINGUAL_TABLET | SUBLINGUAL | 0 refills | Status: DC | PRN

## 2019-05-18 MED ORDER — METOPROLOL SUCCINATE ER 25 MG PO TB24: 25.0000 mg | ORAL_TABLET | Freq: Every day | ORAL | 3 refills | Status: DC

## 2019-05-18 NOTE — Assessment & Plan Note (Signed)
Clinton Ortiz had some elevated blood sugars, we did recently increase him to 3 mg of Trulicity. He understands that we can go to 4.5 mg if needed.

## 2019-05-18 NOTE — Assessment & Plan Note (Addendum)
This weekend Clinton Ortiz was at rest on his couch, he developed substernal chest pain with radiation to the arm and the neck, no palpitations, nausea, diaphoresis. It lasted for short period of time and resolved.  He went to the ED, cardiac enzymes were negative, ECG was unrevealing, chest x-ray unrevealing. He is having some increasing swelling in his lower extremities, for this reason we are going to go ahead and pull the trigger for a nuclear stress test as he will likely be unable to walk on a treadmill. I am also going to add an echocardiogram, furosemide 40 mg daily as he has gained some significant weight. Also adding nitroglycerin to use, he can continue his aspirin. Return to see me after the nuclear stress test.  Nuclear stress test is abnormal, there is a perfusion defect, he will likely need a cardiac catheterization and possibly stenting, urgent referral placed to cardiology.

## 2019-05-18 NOTE — Progress Notes (Addendum)
    Procedures performed today:    None.  Independent interpretation of tests performed by another provider:   None.  Impression and Recommendations:    Controlled type 2 diabetes mellitus with diabetic nephropathy ([place]) Tra had some elevated blood sugars, we did recently increase him to 3 mg of Trulicity. He understands that we can go to 4.5 mg if needed.  Chest pain at rest This weekend Violet was at rest on his couch, he developed substernal chest pain with radiation to the arm and the neck, no palpitations, nausea, diaphoresis. It lasted for short period of time and resolved.  He went to the ED, cardiac enzymes were negative, ECG was unrevealing, chest x-ray unrevealing. He is having some increasing swelling in his lower extremities, for this reason we are going to go ahead and pull the trigger for a nuclear stress test as he will likely be unable to walk on a treadmill. I am also going to add an echocardiogram, furosemide 40 mg daily as he has gained some significant weight. Also adding nitroglycerin to use, he can continue his aspirin. Return to see me after the nuclear stress test.  Nuclear stress test is abnormal, there is a perfusion defect, he will likely need a cardiac catheterization and possibly stenting, urgent referral placed to cardiology.    ___________________________________________ Gwen Her. Dianah Field, M.D., ABFM., CAQSM. Primary Care and [place] Instructor of [place] of [facility] of Medicine

## 2019-05-25 ENCOUNTER — Telehealth (HOSPITAL_COMMUNITY): Payer: Self-pay | Admitting: *Deleted

## 2019-05-25 NOTE — Telephone Encounter (Signed)
Patient given detailed instructions per Myocardial Perfusion Study Information Sheet for the test on 05/30/2019 at 1300. Patient notified to arrive 15 minutes early and that it is imperative to arrive on time for appointment to keep from having the test rescheduled.  If you need to cancel or reschedule your appointment, please call the office within 24 hours of your appointment. . Patient verbalized understanding.Clinton Ortiz, Lucius Conn letter sent with instructions

## 2019-05-30 ENCOUNTER — Ambulatory Visit (HOSPITAL_COMMUNITY): Payer: Medicare Other | Attending: Internal Medicine

## 2019-05-30 ENCOUNTER — Other Ambulatory Visit: Payer: Self-pay

## 2019-05-30 VITALS — Ht 67.0 in | Wt 268.0 lb

## 2019-05-30 DIAGNOSIS — R079 Chest pain, unspecified: Secondary | ICD-10-CM | POA: Diagnosis not present

## 2019-05-30 MED ORDER — REGADENOSON 0.4 MG/5ML IV SOLN
0.4000 mg | Freq: Once | INTRAVENOUS | Status: AC
  Administered 2019-05-30: 0.4 mg via INTRAVENOUS

## 2019-05-30 MED ORDER — TECHNETIUM TC 99M TETROFOSMIN IV KIT
31.3000 | PACK | Freq: Once | INTRAVENOUS | Status: AC | PRN
  Administered 2019-05-30: 31.3 via INTRAVENOUS
  Filled 2019-05-30: qty 32

## 2019-05-31 ENCOUNTER — Ambulatory Visit (HOSPITAL_COMMUNITY): Payer: Medicare Other

## 2019-05-31 ENCOUNTER — Ambulatory Visit (HOSPITAL_COMMUNITY): Payer: Medicare Other | Attending: Cardiovascular Disease

## 2019-05-31 DIAGNOSIS — R079 Chest pain, unspecified: Secondary | ICD-10-CM

## 2019-05-31 DIAGNOSIS — I5043 Acute on chronic combined systolic (congestive) and diastolic (congestive) heart failure: Secondary | ICD-10-CM | POA: Diagnosis present

## 2019-05-31 LAB — MYOCARDIAL PERFUSION IMAGING
LV dias vol: 62 mL (ref 62–150)
LV sys vol: 20 mL
Peak HR: 94 {beats}/min
Rest HR: 72 {beats}/min
SDS: 3
SRS: 3
SSS: 6
TID: 0.69

## 2019-05-31 MED ORDER — PERFLUTREN LIPID MICROSPHERE
1.0000 mL | INTRAVENOUS | Status: AC | PRN
  Administered 2019-05-31: 3 mL via INTRAVENOUS

## 2019-05-31 MED ORDER — TECHNETIUM TC 99M TETROFOSMIN IV KIT
30.6000 | PACK | Freq: Once | INTRAVENOUS | Status: AC | PRN
  Administered 2019-05-31: 30.6 via INTRAVENOUS
  Filled 2019-05-31: qty 31

## 2019-05-31 NOTE — Addendum Note (Signed)
Addended by: Silverio Decamp on: 05/31/2019 04:23 PM   Modules accepted: Orders

## 2019-06-01 ENCOUNTER — Encounter: Payer: Self-pay | Admitting: Sports Medicine

## 2019-06-01 ENCOUNTER — Ambulatory Visit (INDEPENDENT_AMBULATORY_CARE_PROVIDER_SITE_OTHER): Payer: Medicare Other | Admitting: Sports Medicine

## 2019-06-01 ENCOUNTER — Other Ambulatory Visit: Payer: Self-pay

## 2019-06-01 VITALS — BP 112/69 | HR 75 | Ht 67.0 in | Wt 265.0 lb

## 2019-06-01 DIAGNOSIS — E1121 Type 2 diabetes mellitus with diabetic nephropathy: Secondary | ICD-10-CM | POA: Diagnosis not present

## 2019-06-01 LAB — GLUCOSE, POCT (MANUAL RESULT ENTRY): POC Glucose: 375 mg/dl — AB (ref 70–99)

## 2019-06-01 MED ORDER — GLIPIZIDE 10 MG PO TABS: 10.0000 mg | ORAL_TABLET | Freq: Two times a day (BID) | ORAL | 3 refills | Status: DC

## 2019-06-01 NOTE — Progress Notes (Signed)
    Procedures performed today:    Random fingerstick blood sugar today was 375.  Independent interpretation of tests performed by another provider:   None.  Impression and Recommendations:    Controlled type 2 diabetes mellitus with diabetic nephropathy (HCC) Clinton Ortiz returns, we recently increased his Trulicity to 3 mg. Unfortunately his blood sugars are continuing to run high, he feels okay with the exception of mild shortness of breath with exertion. No polyuria, polydipsia, polyphagia. I think in the meantime until his new Trulicity starts to work we need to add glipizide 10 mg twice daily. He is also going to check his blood sugars 3 times daily and keep a diary and we will go over this again in 2 weeks.    ___________________________________________ Gwen Her. Dianah Field, M.D., ABFM., CAQSM. Primary Care and [place] Instructor of [place] of [facility] of Medicine

## 2019-06-01 NOTE — Assessment & Plan Note (Signed)
Clinton Ortiz returns, we recently increased his Trulicity to 3 mg. Unfortunately his blood sugars are continuing to run high, he feels okay with the exception of mild shortness of breath with exertion. No polyuria, polydipsia, polyphagia. I think in the meantime until his new Trulicity starts to work we need to add glipizide 10 mg twice daily. He is also going to check his blood sugars 3 times daily and keep a diary and we will go over this again in 2 weeks.

## 2019-06-10 ENCOUNTER — Ambulatory Visit: Payer: Medicare Other

## 2019-06-15 ENCOUNTER — Ambulatory Visit (INDEPENDENT_AMBULATORY_CARE_PROVIDER_SITE_OTHER): Payer: Medicare Other

## 2019-06-15 ENCOUNTER — Encounter: Payer: Self-pay | Admitting: Sports Medicine

## 2019-06-15 ENCOUNTER — Other Ambulatory Visit: Payer: Self-pay

## 2019-06-15 ENCOUNTER — Ambulatory Visit (INDEPENDENT_AMBULATORY_CARE_PROVIDER_SITE_OTHER): Payer: Medicare Other | Admitting: Sports Medicine

## 2019-06-15 VITALS — BP 117/77 | HR 81 | Ht 67.0 in | Wt 264.0 lb

## 2019-06-15 DIAGNOSIS — S99921A Unspecified injury of right foot, initial encounter: Secondary | ICD-10-CM | POA: Diagnosis not present

## 2019-06-15 DIAGNOSIS — E1121 Type 2 diabetes mellitus with diabetic nephropathy: Secondary | ICD-10-CM | POA: Diagnosis not present

## 2019-06-15 DIAGNOSIS — S92511A Displaced fracture of proximal phalanx of right lesser toe(s), initial encounter for closed fracture: Secondary | ICD-10-CM | POA: Diagnosis not present

## 2019-06-15 LAB — GLUCOSE, POCT (MANUAL RESULT ENTRY): POC Glucose: 325 mg/dl — AB (ref 70–99)

## 2019-06-15 MED ORDER — DEXCOM G6 SENSOR MISC: 1.0000 | Freq: Every day | 0 refills | Status: DC

## 2019-06-15 MED ORDER — TRULICITY 4.5 MG/0.5ML ~~LOC~~ SOAJ: 1.0000 "pen " | SUBCUTANEOUS | 11 refills | Status: DC

## 2019-06-15 MED ORDER — DEXCOM G6 TRANSMITTER MISC: 1.0000 | Freq: Every day | 0 refills | Status: DC

## 2019-06-15 MED ORDER — DEXCOM G6 RECEIVER DEVI: 1.0000 | Freq: Every day | 0 refills | Status: DC

## 2019-06-15 NOTE — Progress Notes (Addendum)
    Procedures performed today:    None.  Independent interpretation of tests performed by another provider:   I personally reviewed Clinton Ortiz's x-rays, he has widespread degenerative changes and also appears to have an acute fracture at the fourth proximal phalanx  Impression and Recommendations:    Controlled type 2 diabetes mellitus with diabetic nephropathy ([place]) [place] returns again, we had increased his Trulicity to 3 mg. At the last visit I also added glipizide 10 mg twice daily. Blood sugars continue to run high, 300s in the last visit, 300s this visit. He does not have his log we did check a point-of-care. No polyuria, polydipsia, polyphagia. Today we are going to increase his Trulicity to 4.5 mg and add a Dexcom glucose monitor.  Right foot injury Pain and bruising over the third and fourth metatarsal shafts, adding x-rays.  X-rays do show what appears to be an acute fourth proximal phalangeal fracture, he can just buddy tape the third and fourth toes together.  Wear rigid soled shoes as often as possible and this will heal on its own.    ___________________________________________ Clinton Ortiz. Clinton Ortiz, M.D., ABFM., CAQSM. Primary Care and [place] Instructor of [employer] [facility]

## 2019-06-15 NOTE — Addendum Note (Signed)
Addended by: Silverio Decamp on: 06/15/2019 10:15 AM   Modules accepted: Orders

## 2019-06-15 NOTE — Assessment & Plan Note (Signed)
Clinton Ortiz returns again, we had increased his Trulicity to 3 mg. At the last visit I also added glipizide 10 mg twice daily. Blood sugars continue to run high, 300s in the last visit, 300s this visit. He does not have his log we did check a point-of-care. No polyuria, polydipsia, polyphagia. Today we are going to increase his Trulicity to 4.5 mg and add a Dexcom glucose monitor.

## 2019-06-15 NOTE — Assessment & Plan Note (Addendum)
Pain and bruising over the third and fourth metatarsal shafts, adding x-rays.  X-rays do show what appears to be an acute fourth proximal phalangeal fracture, he can just buddy tape the third and fourth toes together.  Wear rigid soled shoes as often as possible and this will heal on its own.

## 2019-06-15 NOTE — Progress Notes (Signed)
Referring-Thomas Thekkekandam MD Reason for referral-Chest pain  HPI: 79 yo male for evaluation of chest pain at request of Aundria Mems MD. Echocardiogram January 2021 showed normal LV systolic function, grade 1 diastolic dysfunction and  dilated aortic root measuring 45 mm.  Nuclear study January 2021 showed ejection fraction 68%; there was ischemia in the mid anterior lateral wall/apex and inferoseptal wall.  Cardiology now asked to evaluate.  Approximately 3 months ago the patient had an episode of chest pain while lying on his bed.  It was substernal with radiation to his left upper extremity and neck.  It was described as a dull pain.  There was associated dyspnea but no nausea or diaphoresis.  Pain was nonpleuritic.  Lasted for approximately 1 hour and resolved.  He also describes dyspnea on exertion for approximately 1 year.  No orthopnea or PND but he does have mild pedal edema.  No syncope.  Cardiology now asked to evaluate.  Current Outpatient Medications  Medication Sig Dispense Refill  . acetaminophen (TYLENOL) 325 MG tablet Take 650 mg by mouth every 6 (six) hours as needed.    . Albuterol Sulfate (PROAIR RESPICLICK) 195 (90 BASE) MCG/ACT AEPB Inhale 2 puffs into the lungs every 4 (four) hours as needed. 1 each 0  . aspirin 325 MG EC tablet Take 325 mg by mouth daily.    Marland Kitchen azelastine (ASTELIN) 0.1 % nasal spray SPRAY 2 SPRAYS INTO EACH NOSTRIL TWICE A DAY AS DIRECTED 30 mL 1  . B Complex Vitamins (B COMPLEX PO) Take by mouth daily.    . Dulaglutide (TRULICITY) 4.5 KD/3.2IZ SOPN Inject 1 pen into the skin once a week. 4 pen 11  . furosemide (LASIX) 40 MG tablet Take 1 tablet (40 mg total) by mouth daily. (Patient taking differently: Take 40 mg by mouth at bedtime. ) 90 tablet 3  . gabapentin (NEURONTIN) 300 MG capsule TAKE 1 CAPSULE BY MOUTH IN THE MORNING, 2 CAPSULES AT BEDTIME 90 capsule 3  . glipiZIDE (GLUCOTROL) 10 MG tablet Take 1 tablet (10 mg total) by mouth 2 (two)  times daily before a meal. 60 tablet 3  . losartan (COZAAR) 100 MG tablet Take 1 tablet (100 mg total) by mouth daily. 90 tablet 3  . metoprolol succinate (TOPROL XL) 25 MG 24 hr tablet Take 1 tablet (25 mg total) by mouth daily. Take with or immediately following a meal. 30 tablet 3  . Multiple Vitamins-Minerals (MULTIVITAMIN ADULTS 50+ PO) Take by mouth.    . nitroGLYCERIN (NITROSTAT) 0.4 MG SL tablet Place 1 tablet (0.4 mg total) under the tongue every 5 (five) minutes as needed for chest pain (or tightness). 30 tablet 0  . ondansetron (ZOFRAN-ODT) 8 MG disintegrating tablet Take 1 tablet (8 mg total) by mouth every 8 (eight) hours as needed for nausea. 20 tablet 11  . pantoprazole (PROTONIX) 40 MG tablet Take 1 tablet (40 mg total) by mouth 2 (two) times daily before a meal. 180 tablet 1  . pravastatin (PRAVACHOL) 40 MG tablet TAKE 1 TABLET (40 MG TOTAL) BY MOUTH DAILY. 90 tablet 3  . tamsulosin (FLOMAX) 0.4 MG CAPS capsule TAKE 2 CAPSULES BY MOUTH ONCE A DAY 180 capsule 1  . traMADol (ULTRAM) 50 MG tablet TAKE 1 TABLET (50 MG TOTAL) BY MOUTH 2 (TWO) TIMES DAILY. FOR PAIN 60 tablet 3  . TRELEGY ELLIPTA 100-62.5-25 MCG/INH AEPB INHALE 1 PUFF BY MOUTH EVERY DAY 60 each 11  . triamcinolone (NASACORT) 55 MCG/ACT AERO nasal  inhaler Place 2 sprays into the nose daily. 1 Inhaler 11  . Continuous Blood Gluc Receiver ([place]) DEVI 1 each by Does not apply route daily. (Patient not taking: Reported on 06/22/2019) 1 each 0  . Continuous Blood Gluc Sensor (DEXCOM G6 SENSOR) MISC 1 each by Does not apply route daily. (Patient not taking: Reported on 06/22/2019) 3 each 0  . Continuous Blood Gluc Transmit (DEXCOM G6 TRANSMITTER) MISC 1 each by Does not apply route daily. (Patient not taking: Reported on 06/22/2019) 1 each 0   No current facility-administered medications for this visit.    Allergies  Allergen Reactions  . Flavoring Agent Other (See Comments)    makes him feel funny  . Penicillins  Nausea And Vomiting     Past Medical History:  Diagnosis Date  . Cancer ([place])    left kidney  . COPD (chronic obstructive pulmonary disease) ([place])   . Diabetes mellitus ([place])   . Frequent urination   . GERD (gastroesophageal reflux disease)   . Hernia   . Hyperlipidemia   . Hypertension   . Renal insufficiency   . Spondylolysis   . Tongue cancer [facility])     Past Surgical History:  Procedure Laterality Date  . HERNIA REPAIR    . NEPHRECTOMY     left    Social History   Socioeconomic History  . Marital status: Married    Spouse name: Not on file  . Number of children: 2  . Years of education: Not on file  . Highest education level: Not on file  Occupational History  . Not on file  Tobacco Use  . Smoking status: Former Smoker    Packs/day: 1.00    Years: 2.00    Pack years: 2.00    Types: Cigarettes  . Smokeless tobacco: Never Used  Substance and Sexual Activity  . Alcohol use: No  . Drug use: No  . Sexual activity: Never  Other Topics Concern  . Not on file  Social History Narrative  . Not on file   Social Determinants of Health   Financial Resource Strain:   . Difficulty of Paying Living Expenses: Not on file  Food Insecurity:   . Worried About Charity fundraiser in the Last Year: Not on file  . Ran Out of Food in the Last Year: Not on file  Transportation Needs:   . Lack of Transportation (Medical): Not on file  . Lack of Transportation (Non-Medical): Not on file  Physical Activity:   . Days of Exercise per Week: Not on file  . Minutes of Exercise per Session: Not on file  Stress:   . Feeling of Stress : Not on file  Social Connections:   . Frequency of Communication with Friends and Family: Not on file  . Frequency of Social Gatherings with Friends and Family: Not on file  . Attends Religious Services: Not on file  . Active Member of Clubs or Organizations: Not on file  . Attends Archivist Meetings: Not on file  . Marital Status:  Not on file  Intimate Partner Violence:   . Fear of Current or Ex-Partner: Not on file  . Emotionally Abused: Not on file  . Physically Abused: Not on file  . Sexually Abused: Not on file    Family History  Problem Relation Age of Onset  . Cancer Mother        bladder  . Heart attack Father     ROS: Knee  and back pain but no fevers or chills, productive cough, hemoptysis, dysphasia, odynophagia, melena, hematochezia, dysuria, hematuria, rash, seizure activity, orthopnea, PND, claudication. Remaining systems are negative.  Physical Exam:   Blood pressure 101/70, pulse 90, height 5\' 7"  (1.702 m), weight 264 lb (119.7 kg), SpO2 91 %.  General:  Well developed/obese in NAD Skin warm/dry Patient not depressed No peripheral clubbing Back-normal HEENT-normal/normal eyelids Neck supple/normal carotid upstroke bilaterally; no bruits; no JVD; no thyromegaly chest - CTA/ normal expansion CV - RRR/normal S1 and S2; no murmurs, rubs or gallops;  PMI nondisplaced Abdomen -NT/ND, no HSM, no mass, + bowel sounds, no bruit 2+ femoral pulses, no bruits Ext-trace edema, chords, 2+ DP Neuro-grossly nonfocal  ECG -sinus rhythm, RV conduction delay.  Personally reviewed  A/P  1 chest pain-description is concerning but he has had no recurrences since his initial episode.  He has risk factors including 10 to 12 years of diabetes mellitus.  His nuclear study shows ischemia in the distal anterior wall/apex.  I discussed options of medical therapy versus proceeding with cardiac catheterization today.  We have decided to proceed with catheterization for definitive evaluation.  The risks and benefits including myocardial infarction, CVA and death discussed and he agrees to proceed.  Note he does have chronic stage III kidney disease and history of nephrectomy.  He will be at risk for contrast nephropathy.  Would hold Lasix the day prior to and the day of the procedure.  We will check creatinine today and  if increased compared to previous we will also consider admitting the night before for IV hydration.  Would limit dye.  No ventriculogram.  Check potassium and renal function 2 days following procedure.  2 abnormal nuclear study-plan as outlined above.  3 chronic stage III kidney disease-most recent creatinine October 19, 2018 2.09.  4 hypertension-blood pressure controlled.  Continue present medications.  Hold losartan day of procedure.  5 hyperlipidemia-continue statin.  6 dilated aortic root-noted on recent echocardiogram.  We will arrange noncontrast chest CT to further assess.  I will avoid dye given baseline renal insufficiency.  Kirk Ruths, MD

## 2019-06-17 ENCOUNTER — Other Ambulatory Visit: Payer: Self-pay | Admitting: Sports Medicine

## 2019-06-17 ENCOUNTER — Ambulatory Visit: Payer: Medicare Other

## 2019-06-17 DIAGNOSIS — J440 Chronic obstructive pulmonary disease with acute lower respiratory infection: Secondary | ICD-10-CM

## 2019-06-17 DIAGNOSIS — E1121 Type 2 diabetes mellitus with diabetic nephropathy: Secondary | ICD-10-CM

## 2019-06-21 ENCOUNTER — Ambulatory Visit: Payer: Medicare Other

## 2019-06-22 ENCOUNTER — Other Ambulatory Visit: Payer: Self-pay

## 2019-06-22 ENCOUNTER — Encounter: Payer: Self-pay | Admitting: Cardiology

## 2019-06-22 ENCOUNTER — Ambulatory Visit (INDEPENDENT_AMBULATORY_CARE_PROVIDER_SITE_OTHER): Payer: Medicare Other | Admitting: Cardiology

## 2019-06-22 VITALS — BP 101/70 | HR 90 | Ht 67.0 in | Wt 264.0 lb

## 2019-06-22 DIAGNOSIS — I712 Thoracic aortic aneurysm, without rupture, unspecified: Secondary | ICD-10-CM

## 2019-06-22 DIAGNOSIS — E7800 Pure hypercholesterolemia, unspecified: Secondary | ICD-10-CM | POA: Diagnosis not present

## 2019-06-22 DIAGNOSIS — I10 Essential (primary) hypertension: Secondary | ICD-10-CM | POA: Diagnosis not present

## 2019-06-22 DIAGNOSIS — R079 Chest pain, unspecified: Secondary | ICD-10-CM

## 2019-06-22 NOTE — Patient Instructions (Addendum)
Medication Instructions:  NO CHANGE *If you need a refill on your cardiac medications before your next appointment, please call your pharmacy*  Lab Work: Your physician recommends that you HAVE LAB WORK TODAY  If you have labs (blood work) drawn today and your tests are completely normal, you will receive your results only by: Marland Kitchen MyChart Message (if you have MyChart) OR . A paper copy in the mail If you have any lab test that is abnormal or we need to change your treatment, we will call you to review the results.  Testing/Procedures: Your physician has requested that you have a cardiac catheterization. Cardiac catheterization is used to diagnose and/or treat various heart conditions. Doctors may recommend this procedure for a number of different reasons. The most common reason is to evaluate chest pain. Chest pain can be a symptom of coronary artery disease (CAD), and cardiac catheterization can show whether plaque is narrowing or blocking your heart's arteries. This procedure is also used to evaluate the valves, as well as measure the blood flow and oxygen levels in different parts of your heart. For further information please visit [email or web address]. Please follow instruction sheet, as given.   CT WO CONTRAST TO FOLLOW UP DILATED AORTIC ROOT=[place] OFFICE  Follow-Up: At [facility], you and your health needs are our priority.  As part of our continuing mission to provide you with exceptional heart care, we have created designated Provider Care Teams.  These Care Teams include your primary Cardiologist (physician) and Advanced Practice Providers (APPs -  Physician Assistants and Nurse Practitioners) who all work together to provide you with the care you need, when you need it.  Your next appointment:   8 week(s)  The format for your next appointment:   In Person  Provider:   Kirk Ruths, MD

## 2019-06-23 ENCOUNTER — Telehealth: Payer: Self-pay | Admitting: *Deleted

## 2019-06-23 ENCOUNTER — Ambulatory Visit (INDEPENDENT_AMBULATORY_CARE_PROVIDER_SITE_OTHER): Payer: Medicare Other

## 2019-06-23 DIAGNOSIS — I712 Thoracic aortic aneurysm, without rupture, unspecified: Secondary | ICD-10-CM

## 2019-06-23 LAB — CBC
HCT: 43.2 % (ref 38.5–50.0)
Hemoglobin: 14.5 g/dL (ref 13.2–17.1)
MCH: 32.6 pg (ref 27.0–33.0)
MCHC: 33.6 g/dL (ref 32.0–36.0)
MCV: 97.1 fL (ref 80.0–100.0)
MPV: 13.7 fL — ABNORMAL HIGH (ref 7.5–12.5)
Platelets: 148 10*3/uL (ref 140–400)
RBC: 4.45 10*6/uL (ref 4.20–5.80)
RDW: 11.8 % (ref 11.0–15.0)
WBC: 7 10*3/uL (ref 3.8–10.8)

## 2019-06-23 LAB — BASIC METABOLIC PANEL
BUN/Creatinine Ratio: 16 (calc) (ref 6–22)
BUN: 43 mg/dL — ABNORMAL HIGH (ref 7–25)
CO2: 30 mmol/L (ref 20–32)
Calcium: 9.2 mg/dL (ref 8.6–10.3)
Chloride: 102 mmol/L (ref 98–110)
Creat: 2.74 mg/dL — ABNORMAL HIGH (ref 0.70–1.18)
Glucose, Bld: 377 mg/dL — ABNORMAL HIGH (ref 65–99)
Potassium: 5.1 mmol/L (ref 3.5–5.3)
Sodium: 141 mmol/L (ref 135–146)

## 2019-06-23 NOTE — Telephone Encounter (Signed)
-----   Message from Lelon Perla, MD sent at 06/23/2019  7:22 AM EST ----- Hold lasix 48 hours prior to and day of cath; will need to be admitted night before cath for IV hydration; hold losartan 48 hours prior to cath [facility]

## 2019-06-23 NOTE — Telephone Encounter (Signed)
Spoke with pt, Aware of dr Jacalyn Lefevre recommendations. He does not want to schedule anything right now and will have his daughter call back to schedule.

## 2019-06-24 NOTE — Telephone Encounter (Signed)
Patient's daughter calling back to schedule.

## 2019-06-26 ENCOUNTER — Other Ambulatory Visit: Payer: Self-pay | Admitting: Sports Medicine

## 2019-06-27 ENCOUNTER — Other Ambulatory Visit: Payer: Self-pay | Admitting: Sports Medicine

## 2019-06-27 ENCOUNTER — Other Ambulatory Visit (HOSPITAL_COMMUNITY)
Admission: RE | Admit: 2019-06-27 | Discharge: 2019-06-27 | Disposition: A | Discharge status: Home / Self Care | Payer: Medicare Other | Source: Ambulatory Visit | Attending: Interventional Cardiology | Admitting: Interventional Cardiology

## 2019-06-27 DIAGNOSIS — Z85528 Personal history of other malignant neoplasm of kidney: Secondary | ICD-10-CM | POA: Diagnosis not present

## 2019-06-27 DIAGNOSIS — Z79899 Other long term (current) drug therapy: Secondary | ICD-10-CM | POA: Diagnosis not present

## 2019-06-27 DIAGNOSIS — Z905 Acquired absence of kidney: Secondary | ICD-10-CM | POA: Diagnosis not present

## 2019-06-27 DIAGNOSIS — K219 Gastro-esophageal reflux disease without esophagitis: Secondary | ICD-10-CM | POA: Diagnosis not present

## 2019-06-27 DIAGNOSIS — K769 Liver disease, unspecified: Secondary | ICD-10-CM | POA: Diagnosis not present

## 2019-06-27 DIAGNOSIS — Z7982 Long term (current) use of aspirin: Secondary | ICD-10-CM | POA: Diagnosis not present

## 2019-06-27 DIAGNOSIS — Z8249 Family history of ischemic heart disease and other diseases of the circulatory system: Secondary | ICD-10-CM | POA: Diagnosis not present

## 2019-06-27 DIAGNOSIS — Z7984 Long term (current) use of oral hypoglycemic drugs: Secondary | ICD-10-CM | POA: Diagnosis not present

## 2019-06-27 DIAGNOSIS — N184 Chronic kidney disease, stage 4 (severe): Secondary | ICD-10-CM | POA: Diagnosis not present

## 2019-06-27 DIAGNOSIS — R9439 Abnormal result of other cardiovascular function study: Secondary | ICD-10-CM | POA: Diagnosis present

## 2019-06-27 DIAGNOSIS — Z79891 Long term (current) use of opiate analgesic: Secondary | ICD-10-CM | POA: Diagnosis not present

## 2019-06-27 DIAGNOSIS — I2510 Atherosclerotic heart disease of native coronary artery without angina pectoris: Secondary | ICD-10-CM | POA: Diagnosis not present

## 2019-06-27 DIAGNOSIS — I129 Hypertensive chronic kidney disease with stage 1 through stage 4 chronic kidney disease, or unspecified chronic kidney disease: Secondary | ICD-10-CM | POA: Diagnosis not present

## 2019-06-27 DIAGNOSIS — Z85810 Personal history of malignant neoplasm of tongue: Secondary | ICD-10-CM | POA: Diagnosis not present

## 2019-06-27 DIAGNOSIS — J449 Chronic obstructive pulmonary disease, unspecified: Secondary | ICD-10-CM | POA: Diagnosis not present

## 2019-06-27 DIAGNOSIS — E1122 Type 2 diabetes mellitus with diabetic chronic kidney disease: Secondary | ICD-10-CM | POA: Diagnosis not present

## 2019-06-27 DIAGNOSIS — E785 Hyperlipidemia, unspecified: Secondary | ICD-10-CM | POA: Diagnosis not present

## 2019-06-27 DIAGNOSIS — G8929 Other chronic pain: Secondary | ICD-10-CM | POA: Diagnosis not present

## 2019-06-27 DIAGNOSIS — I712 Thoracic aortic aneurysm, without rupture: Secondary | ICD-10-CM | POA: Diagnosis not present

## 2019-06-27 DIAGNOSIS — Z6841 Body Mass Index (BMI) 40.0 and over, adult: Secondary | ICD-10-CM | POA: Diagnosis not present

## 2019-06-27 DIAGNOSIS — Z20822 Contact with and (suspected) exposure to covid-19: Secondary | ICD-10-CM | POA: Diagnosis not present

## 2019-06-27 DIAGNOSIS — Z87891 Personal history of nicotine dependence: Secondary | ICD-10-CM | POA: Diagnosis not present

## 2019-06-27 DIAGNOSIS — M4300 Spondylolysis, site unspecified: Secondary | ICD-10-CM | POA: Diagnosis not present

## 2019-06-27 LAB — SARS CORONAVIRUS 2 (TAT 6-24 HRS): SARS Coronavirus 2: NEGATIVE

## 2019-06-27 MED ORDER — PANTOPRAZOLE SODIUM 40 MG PO TBEC: 40.0000 mg | DELAYED_RELEASE_TABLET | Freq: Every day | ORAL | 3 refills | Status: DC

## 2019-06-27 NOTE — Telephone Encounter (Signed)
Spoke to daughter-would like to schedule cath Friday 2/19.   Aware will need to be admitted 2/18 for IV hydration.  Will schedule and call with instructions.   Daughter aware

## 2019-06-27 NOTE — Telephone Encounter (Signed)
Spoke to daughter to review plan and instructions:  Covid test today Hospital will call Thursday 2/18 to notify when bed is ready for admission Cath on 2/19 with Dr. Tamala Julian at [place] to hold Lasix and Losartan on 17th and 18th.  Daughter verbalized understanding. Advised to call with further questions or concerns.

## 2019-06-27 NOTE — Telephone Encounter (Signed)
Spoke to daughter-per procedural nurse, Covid screening must be completed and resulted prior to admission.     Patient will have covid test today at 2:40 pm Cath scheduled for Friday 2/19 at 7:30 am with Dr. Tamala Julian Will arrange admission on 2/18 for IV hydration  Daughter aware to hold Losartan and Lasix for 48 hours prior-17th and 18th  Will call with full instructions.  Daughter aware.

## 2019-06-28 ENCOUNTER — Other Ambulatory Visit: Payer: Self-pay | Admitting: *Deleted

## 2019-06-28 DIAGNOSIS — K769 Liver disease, unspecified: Secondary | ICD-10-CM

## 2019-06-28 NOTE — Progress Notes (Signed)
CT

## 2019-06-30 ENCOUNTER — Inpatient Hospital Stay (HOSPITAL_COMMUNITY)
Admission: RE | Admit: 2019-06-30 | Discharge: 2019-07-01 | DRG: 287 | Disposition: A | Discharge status: Home / Self Care | Payer: Medicare Other | Attending: Cardiology | Admitting: Cardiology

## 2019-06-30 DIAGNOSIS — N184 Chronic kidney disease, stage 4 (severe): Secondary | ICD-10-CM | POA: Diagnosis present

## 2019-06-30 DIAGNOSIS — E1122 Type 2 diabetes mellitus with diabetic chronic kidney disease: Secondary | ICD-10-CM | POA: Diagnosis not present

## 2019-06-30 DIAGNOSIS — Z7984 Long term (current) use of oral hypoglycemic drugs: Secondary | ICD-10-CM | POA: Diagnosis not present

## 2019-06-30 DIAGNOSIS — Z79891 Long term (current) use of opiate analgesic: Secondary | ICD-10-CM

## 2019-06-30 DIAGNOSIS — Z85528 Personal history of other malignant neoplasm of kidney: Secondary | ICD-10-CM

## 2019-06-30 DIAGNOSIS — G8929 Other chronic pain: Secondary | ICD-10-CM | POA: Diagnosis present

## 2019-06-30 DIAGNOSIS — Z20822 Contact with and (suspected) exposure to covid-19: Secondary | ICD-10-CM | POA: Diagnosis present

## 2019-06-30 DIAGNOSIS — R0600 Dyspnea, unspecified: Secondary | ICD-10-CM | POA: Diagnosis not present

## 2019-06-30 DIAGNOSIS — I2510 Atherosclerotic heart disease of native coronary artery without angina pectoris: Principal | ICD-10-CM | POA: Diagnosis present

## 2019-06-30 DIAGNOSIS — J449 Chronic obstructive pulmonary disease, unspecified: Secondary | ICD-10-CM | POA: Diagnosis not present

## 2019-06-30 DIAGNOSIS — Z85810 Personal history of malignant neoplasm of tongue: Secondary | ICD-10-CM | POA: Diagnosis not present

## 2019-06-30 DIAGNOSIS — K219 Gastro-esophageal reflux disease without esophagitis: Secondary | ICD-10-CM | POA: Diagnosis not present

## 2019-06-30 DIAGNOSIS — Z79899 Other long term (current) drug therapy: Secondary | ICD-10-CM | POA: Diagnosis not present

## 2019-06-30 DIAGNOSIS — M4300 Spondylolysis, site unspecified: Secondary | ICD-10-CM | POA: Diagnosis not present

## 2019-06-30 DIAGNOSIS — E782 Mixed hyperlipidemia: Secondary | ICD-10-CM | POA: Diagnosis not present

## 2019-06-30 DIAGNOSIS — E785 Hyperlipidemia, unspecified: Secondary | ICD-10-CM | POA: Diagnosis present

## 2019-06-30 DIAGNOSIS — Z905 Acquired absence of kidney: Secondary | ICD-10-CM

## 2019-06-30 DIAGNOSIS — Z8249 Family history of ischemic heart disease and other diseases of the circulatory system: Secondary | ICD-10-CM | POA: Diagnosis not present

## 2019-06-30 DIAGNOSIS — Z6841 Body Mass Index (BMI) 40.0 and over, adult: Secondary | ICD-10-CM | POA: Diagnosis not present

## 2019-06-30 DIAGNOSIS — R0609 Other forms of dyspnea: Secondary | ICD-10-CM

## 2019-06-30 DIAGNOSIS — Z7982 Long term (current) use of aspirin: Secondary | ICD-10-CM

## 2019-06-30 DIAGNOSIS — Z87891 Personal history of nicotine dependence: Secondary | ICD-10-CM

## 2019-06-30 DIAGNOSIS — I129 Hypertensive chronic kidney disease with stage 1 through stage 4 chronic kidney disease, or unspecified chronic kidney disease: Secondary | ICD-10-CM | POA: Diagnosis not present

## 2019-06-30 DIAGNOSIS — R9439 Abnormal result of other cardiovascular function study: Secondary | ICD-10-CM | POA: Diagnosis present

## 2019-06-30 DIAGNOSIS — K769 Liver disease, unspecified: Secondary | ICD-10-CM | POA: Diagnosis not present

## 2019-06-30 DIAGNOSIS — I10 Essential (primary) hypertension: Secondary | ICD-10-CM | POA: Diagnosis present

## 2019-06-30 DIAGNOSIS — I712 Thoracic aortic aneurysm, without rupture: Secondary | ICD-10-CM | POA: Diagnosis not present

## 2019-06-30 DIAGNOSIS — E109 Type 1 diabetes mellitus without complications: Secondary | ICD-10-CM | POA: Diagnosis not present

## 2019-06-30 HISTORY — DX: Atherosclerotic heart disease of native coronary artery without angina pectoris: I25.10

## 2019-06-30 LAB — BASIC METABOLIC PANEL
Anion gap: 8 (ref 5–15)
BUN: 37 mg/dL — ABNORMAL HIGH (ref 8–23)
CO2: 25 mmol/L (ref 22–32)
Calcium: 9.1 mg/dL (ref 8.9–10.3)
Chloride: 105 mmol/L (ref 98–111)
Creatinine, Ser: 2.67 mg/dL — ABNORMAL HIGH (ref 0.61–1.24)
GFR calc Af Amer: 26 mL/min — ABNORMAL LOW (ref >60)
GFR calc non Af Amer: 22 mL/min — ABNORMAL LOW (ref >60)
Glucose, Bld: 215 mg/dL — ABNORMAL HIGH (ref 70–99)
Potassium: 4.3 mmol/L (ref 3.5–5.1)
Sodium: 138 mmol/L (ref 135–145)

## 2019-06-30 LAB — CBC WITH DIFFERENTIAL/PLATELET
Abs Immature Granulocytes: 0.02 10*3/uL (ref 0.00–0.07)
Basophils Absolute: 0.1 10*3/uL (ref 0.0–0.1)
Basophils Relative: 1 %
Eosinophils Absolute: 0.3 10*3/uL (ref 0.0–0.5)
Eosinophils Relative: 4 %
HCT: 45.4 % (ref 39.0–52.0)
Hemoglobin: 15.3 g/dL (ref 13.0–17.0)
Immature Granulocytes: 0 %
Lymphocytes Relative: 21 %
Lymphs Abs: 1.6 10*3/uL (ref 0.7–4.0)
MCH: 32.3 pg (ref 26.0–34.0)
MCHC: 33.7 g/dL (ref 30.0–36.0)
MCV: 95.8 fL (ref 80.0–100.0)
Monocytes Absolute: 0.8 10*3/uL (ref 0.1–1.0)
Monocytes Relative: 11 %
Neutro Abs: 4.9 10*3/uL (ref 1.7–7.7)
Neutrophils Relative %: 63 %
Platelets: 178 10*3/uL (ref 150–400)
RBC: 4.74 MIL/uL (ref 4.22–5.81)
RDW: 12.1 % (ref 11.5–15.5)
WBC: 7.7 10*3/uL (ref 4.0–10.5)
nRBC: 0 % (ref 0.0–0.2)

## 2019-06-30 LAB — PROTIME-INR
INR: 1 (ref 0.8–1.2)
Prothrombin Time: 13.2 seconds (ref 11.4–15.2)

## 2019-06-30 LAB — APTT: aPTT: 27 seconds (ref 24–36)

## 2019-06-30 LAB — GLUCOSE, CAPILLARY: Glucose-Capillary: 162 mg/dL — ABNORMAL HIGH (ref 70–99)

## 2019-06-30 MED ORDER — INSULIN ASPART 100 UNIT/ML ~~LOC~~ SOLN
0.0000 [IU] | Freq: Three times a day (TID) | SUBCUTANEOUS | Status: DC
  Administered 2019-06-30: 3 [IU] via SUBCUTANEOUS
  Administered 2019-07-01: 2 [IU] via SUBCUTANEOUS
  Administered 2019-07-01: 3 [IU] via SUBCUTANEOUS
  Administered 2019-07-01: 2 [IU] via SUBCUTANEOUS

## 2019-06-30 MED ORDER — UMECLIDINIUM BROMIDE 62.5 MCG/INH IN AEPB
1.0000 | INHALATION_SPRAY | Freq: Every day | RESPIRATORY_TRACT | Status: DC
  Administered 2019-07-01: 1 via RESPIRATORY_TRACT
  Filled 2019-06-30: qty 7

## 2019-06-30 MED ORDER — ONDANSETRON HCL 4 MG/2ML IJ SOLN: 4.0000 mg | Freq: Four times a day (QID) | INTRAMUSCULAR | Status: DC | PRN

## 2019-06-30 MED ORDER — HEPARIN SODIUM (PORCINE) 5000 UNIT/ML IJ SOLN
5000.0000 [IU] | Freq: Three times a day (TID) | INTRAMUSCULAR | Status: DC
  Administered 2019-06-30 (×2): 5000 [IU] via SUBCUTANEOUS
  Filled 2019-06-30 (×2): qty 1

## 2019-06-30 MED ORDER — GLIPIZIDE 10 MG PO TABS
10.0000 mg | ORAL_TABLET | Freq: Two times a day (BID) | ORAL | Status: DC
  Administered 2019-06-30 – 2019-07-01 (×2): 10 mg via ORAL
  Filled 2019-06-30 (×3): qty 1

## 2019-06-30 MED ORDER — TRAMADOL HCL 50 MG PO TABS: 50.0000 mg | ORAL_TABLET | Freq: Every day | ORAL | Status: DC | PRN

## 2019-06-30 MED ORDER — PANTOPRAZOLE SODIUM 40 MG PO TBEC
40.0000 mg | DELAYED_RELEASE_TABLET | Freq: Every day | ORAL | Status: DC
  Administered 2019-07-01: 40 mg via ORAL
  Filled 2019-06-30: qty 1

## 2019-06-30 MED ORDER — TAMSULOSIN HCL 0.4 MG PO CAPS
0.8000 mg | ORAL_CAPSULE | Freq: Every day | ORAL | Status: DC
  Administered 2019-06-30 – 2019-07-01 (×2): 0.8 mg via ORAL
  Filled 2019-06-30 (×2): qty 2

## 2019-06-30 MED ORDER — NITROGLYCERIN 0.4 MG SL SUBL: 0.4000 mg | SUBLINGUAL_TABLET | SUBLINGUAL | Status: DC | PRN

## 2019-06-30 MED ORDER — FLUTICASONE FUROATE-VILANTEROL 100-25 MCG/INH IN AEPB
1.0000 | INHALATION_SPRAY | Freq: Every day | RESPIRATORY_TRACT | Status: DC
  Administered 2019-07-01: 1 via RESPIRATORY_TRACT
  Filled 2019-06-30: qty 28

## 2019-06-30 MED ORDER — ASPIRIN 81 MG PO CHEW: 81.0000 mg | CHEWABLE_TABLET | Freq: Once | ORAL | Status: DC

## 2019-06-30 MED ORDER — SODIUM CHLORIDE 0.9 % WEIGHT BASED INFUSION
1.0000 mL/kg/h | INTRAVENOUS | Status: DC
  Administered 2019-06-30 – 2019-07-01 (×2): 1 mL/kg/h via INTRAVENOUS

## 2019-06-30 MED ORDER — SODIUM CHLORIDE 0.9 % IV SOLN: 250.0000 mL | INTRAVENOUS | Status: DC | PRN

## 2019-06-30 MED ORDER — SODIUM CHLORIDE 0.9% FLUSH: 3.0000 mL | INTRAVENOUS | Status: DC | PRN

## 2019-06-30 MED ORDER — SODIUM CHLORIDE 0.9 % WEIGHT BASED INFUSION
3.0000 mL/kg/h | INTRAVENOUS | Status: AC
  Administered 2019-06-30: 3 mL/kg/h via INTRAVENOUS

## 2019-06-30 MED ORDER — ACETAMINOPHEN 325 MG PO TABS: 650.0000 mg | ORAL_TABLET | ORAL | Status: DC | PRN

## 2019-06-30 MED ORDER — GABAPENTIN 300 MG PO CAPS
600.0000 mg | ORAL_CAPSULE | Freq: Every day | ORAL | Status: DC
  Administered 2019-06-30 – 2019-07-01 (×2): 600 mg via ORAL
  Filled 2019-06-30: qty 2

## 2019-06-30 MED ORDER — GABAPENTIN 300 MG PO CAPS
300.0000 mg | ORAL_CAPSULE | Freq: Every day | ORAL | Status: DC
  Filled 2019-06-30 (×2): qty 1

## 2019-06-30 MED ORDER — PRAVASTATIN SODIUM 40 MG PO TABS
40.0000 mg | ORAL_TABLET | Freq: Every day | ORAL | Status: DC
  Administered 2019-06-30: 40 mg via ORAL
  Filled 2019-06-30: qty 1

## 2019-06-30 MED ORDER — METOPROLOL SUCCINATE ER 25 MG PO TB24
25.0000 mg | ORAL_TABLET | Freq: Every day | ORAL | Status: DC
  Administered 2019-07-01: 25 mg via ORAL
  Filled 2019-06-30: qty 1

## 2019-06-30 MED ORDER — SODIUM CHLORIDE 0.9% FLUSH
3.0000 mL | Freq: Two times a day (BID) | INTRAVENOUS | Status: DC
  Administered 2019-06-30: 3 mL via INTRAVENOUS

## 2019-06-30 MED ORDER — FLUTICASONE-UMECLIDIN-VILANT 100-62.5-25 MCG/INH IN AEPB: 1.0000 | INHALATION_SPRAY | Freq: Every day | RESPIRATORY_TRACT | Status: DC

## 2019-06-30 MED ORDER — AZELASTINE HCL 0.1 % NA SOLN
2.0000 | Freq: Every day | NASAL | Status: DC
  Filled 2019-06-30: qty 30

## 2019-06-30 MED ORDER — INSULIN ASPART 100 UNIT/ML ~~LOC~~ SOLN: 0.0000 [IU] | Freq: Every day | SUBCUTANEOUS | Status: DC

## 2019-06-30 MED ORDER — ASPIRIN 81 MG PO CHEW
81.0000 mg | CHEWABLE_TABLET | ORAL | Status: AC
  Administered 2019-07-01: 81 mg via ORAL
  Filled 2019-06-30: qty 1

## 2019-06-30 NOTE — Plan of Care (Signed)
  Problem: Education: Goal: Knowledge of General Education information will improve Description: Including pain rating scale, medication(s)/side effects and non-pharmacologic comfort measures Outcome: Progressing   Problem: Coping: Goal: Level of anxiety will decrease Outcome: Progressing   Problem: Pain Managment: Goal: General experience of comfort will improve Outcome: Progressing   

## 2019-06-30 NOTE — Progress Notes (Addendum)
Cardiology Admission History and Physical:   Patient ID: Clinton Ortiz MRN: [id number]; DOB: 12/27/1940   Admission date: 06/30/2019  Primary Care Provider: Silverio Decamp, MD Primary Cardiologist: Clinton Ruths, MD  Primary Electrophysiologist:  None   Chief Complaint:  IV hydration prior to heart cath  Patient Profile:   Clinton Ortiz is a 79 y.o. male with PMH of HTN, HLD, DM type 2, GERD, COPD, tongue cancer and kidney cancer, who presents for overnight hydration prior to [facility] tomorrow.  History of Present Illness:   Patient presents for overnight IV hydration prior to planned [facility] 07/01/19. Seen by Dr. Stanford Breed 06/22/19 to establish of his recent stress test which suggested distal anterior wall/apex ischemia. He was recommended to undergo a LHC to further evaluate his coronary anatomy. Cr 2.74 on labs 06/22/19, up from 2.09 last year, therefore overnight hydration was recommended. He was recommended to hold his lasix and losartan prior to his heart cath.  He presents today as instructed. He denies any recent chest pain. He has chronic DOE which is unchanged in recent weeks. He has some LE edema, worse since he rolled his right ankle recently. No complaints of orthopnea, though he sleeps in a recliner due to chronic back pain. No complaints of palpitations, dizziness, lightheadedness, or syncope. He reports he has been holding his lasix and losartan for the past 3 days.    Heart Pathway Score:     Past Medical History:  Diagnosis Date  . Cancer ([place])    left kidney  . COPD (chronic obstructive pulmonary disease) ([place])   . Diabetes mellitus ([place])   . Frequent urination   . GERD (gastroesophageal reflux disease)   . Hernia   . Hyperlipidemia   . Hypertension   . Renal insufficiency   . Spondylolysis   . Tongue cancer [facility])     Past Surgical History:  Procedure Laterality Date  . HERNIA REPAIR    . NEPHRECTOMY     left     Medications Prior to Admission: Prior to  Admission medications   Medication Sig Start Date End Date Taking? Authorizing Provider  acetaminophen (TYLENOL) 325 MG tablet Take 650 mg by mouth every 6 (six) hours as needed for mild pain or headache.     [provider]  Albuterol Sulfate (PROAIR RESPICLICK) 951 (90 BASE) MCG/ACT AEPB Inhale 2 puffs into the lungs every 4 (four) hours as needed. Patient taking differently: Inhale 2 puffs into the lungs every 4 (four) hours as needed (shortness of breath).  05/26/14   Donella Stade, PA-C  aspirin 325 MG EC tablet Take 325 mg by mouth daily.    [provider]  azelastine (ASTELIN) 0.1 % nasal spray SPRAY 2 SPRAYS INTO EACH NOSTRIL TWICE A DAY AS DIRECTED Patient taking differently: Place 2 sprays into both nostrils daily.  11/12/18   Silverio Decamp, MD  B Complex Vitamins (B COMPLEX PO) Take 1 tablet by mouth daily.     [provider]  Cholecalciferol (VITAMIN D3) 10 MCG (400 UNIT) CHEW Chew by mouth.    [provider]  Continuous Blood Gluc Receiver ([place]) [place] 1 each by Does not apply route daily. Patient not taking: Reported on 06/22/2019 06/15/19   Silverio Decamp, MD  Continuous Blood Gluc Sensor (DEXCOM G6 SENSOR) MISC 1 each by Does not apply route daily. Patient not taking: Reported on 06/22/2019 06/15/19   Silverio Decamp, MD  Continuous Blood Gluc Transmit (DEXCOM G6 TRANSMITTER)  MISC 1 each by Does not apply route daily. Patient not taking: Reported on 06/22/2019 06/15/19   Silverio Decamp, MD  Dulaglutide (TRULICITY) 4.5 KC/1.2XN SOPN Inject 1 pen into the skin once a week. Patient taking differently: Inject 4.5 mg into the skin once a week.  06/15/19   Silverio Decamp, MD  furosemide (LASIX) 40 MG tablet Take 1 tablet (40 mg total) by mouth daily. 05/18/19   Silverio Decamp, MD  gabapentin (NEURONTIN) 300 MG capsule TAKE 1 CAPSULE BY MOUTH IN THE MORNING, 2 CAPSULES AT BEDTIME Patient taking  differently: Take 300-600 mg by mouth. TAKE 300 g  CAPSULE BY MOUTH IN THE MORNING, 600 mg CAPSULES AT BEDTIME 06/17/19   Silverio Decamp, MD  glipiZIDE (GLUCOTROL) 10 MG tablet Take 1 tablet (10 mg total) by mouth 2 (two) times daily before a meal. 06/01/19   Silverio Decamp, MD  losartan (COZAAR) 100 MG tablet Take 1 tablet (100 mg total) by mouth daily. 03/07/19   Silverio Decamp, MD  metoprolol succinate (TOPROL XL) 25 MG 24 hr tablet Take 1 tablet (25 mg total) by mouth daily. Take with or immediately following a meal. Patient taking differently: Take 25 mg by mouth daily.  05/18/19   Silverio Decamp, MD  Multiple Vitamins-Minerals (MULTIVITAMIN ADULTS 50+ PO) Take 1 tablet by mouth daily. Chew    [provider]  nitroGLYCERIN (NITROSTAT) 0.4 MG SL tablet Place 1 tablet (0.4 mg total) under the tongue every 5 (five) minutes as needed for chest pain (or tightness). 05/18/19   Silverio Decamp, MD  ondansetron (ZOFRAN-ODT) 8 MG disintegrating tablet Take 1 tablet (8 mg total) by mouth every 8 (eight) hours as needed for nausea. 01/28/19   Silverio Decamp, MD  pantoprazole (PROTONIX) 40 MG tablet Take 1 tablet (40 mg total) by mouth daily. 06/27/19   Silverio Decamp, MD  pravastatin (PRAVACHOL) 40 MG tablet TAKE 1 TABLET (40 MG TOTAL) BY MOUTH DAILY. Patient taking differently: Take 40 mg by mouth at bedtime.  03/04/19   Silverio Decamp, MD  tamsulosin (FLOMAX) 0.4 MG CAPS capsule TAKE 2 CAPSULES BY MOUTH EVERY DAY Patient taking differently: Take 0.8 mg by mouth daily.  06/27/19   Silverio Decamp, MD  traMADol (ULTRAM) 50 MG tablet TAKE 1 TABLET (50 MG TOTAL) BY MOUTH 2 (TWO) TIMES DAILY. FOR PAIN Patient taking differently: Take 50 mg by mouth daily as needed for moderate pain. for pain 11/04/18   Silverio Decamp, MD  TRELEGY ELLIPTA 100-62.5-25 MCG/INH AEPB INHALE 1 PUFF BY MOUTH EVERY DAY Patient taking differently: Inhale 1  puff into the lungs daily.  06/17/19   Silverio Decamp, MD  triamcinolone (NASACORT) 55 MCG/ACT AERO nasal inhaler Place 2 sprays into the nose daily. Patient taking differently: Place 2 sprays into the nose daily as needed (allergies).  07/29/18   Silverio Decamp, MD     Allergies:    Allergies  Allergen Reactions  . Flavoring Agent Other (See Comments)    makes him feel funny    Social History:   Social History   Socioeconomic History  . Marital status: Married    Spouse name: Not on file  . Number of children: 2  . Years of education: Not on file  . Highest education level: Not on file  Occupational History  . Not on file  Tobacco Use  . Smoking status: Former Smoker    Packs/day: 1.00  Years: 2.00    Pack years: 2.00    Types: Cigarettes  . Smokeless tobacco: Never Used  Substance and Sexual Activity  . Alcohol use: No  . Drug use: No  . Sexual activity: Never  Other Topics Concern  . Not on file  Social History Narrative  . Not on file   Social Determinants of Health   Financial Resource Strain:   . Difficulty of Paying Living Expenses: Not on file  Food Insecurity:   . Worried About Charity fundraiser in the Last Year: Not on file  . Ran Out of Food in the Last Year: Not on file  Transportation Needs:   . Lack of Transportation (Medical): Not on file  . Lack of Transportation (Non-Medical): Not on file  Physical Activity:   . Days of Exercise per Week: Not on file  . Minutes of Exercise per Session: Not on file  Stress:   . Feeling of Stress : Not on file  Social Connections:   . Frequency of Communication with Friends and Family: Not on file  . Frequency of Social Gatherings with Friends and Family: Not on file  . Attends Religious Services: Not on file  . Active Member of Clubs or Organizations: Not on file  . Attends Archivist Meetings: Not on file  . Marital Status: Not on file  Intimate Partner Violence:   . Fear of  Current or Ex-Partner: Not on file  . Emotionally Abused: Not on file  . Physically Abused: Not on file  . Sexually Abused: Not on file    Family History:   The patient's family history includes Cancer in his mother; Heart attack in his father.    ROS:  Please see the history of present illness.  All other ROS reviewed and negative.     Physical Exam/Data:  There were no vitals filed for this visit. No intake or output data in the 24 hours ending 06/30/19 1534 Last 3 Weights 06/22/2019 06/15/2019 06/01/2019  Weight (lbs) 264 lb 264 lb 265 lb  Weight (kg) 119.75 kg 119.75 kg 120.203 kg     There is no height or weight on file to calculate BMI.  General:  Well nourished, well developed, in no acute distress HEENT: sclera anicteric Neck: large girth and beard make it difficult to assess JVD Vascular: No carotid bruits; distal pulses 2+ bilaterally Cardiac:  normal S1, S2; RRR; no murmur  Lungs:  clear to auscultation bilaterally, no wheezing, rhonchi or rales  Abd: soft, obese, nontender, no hepatomegaly  Ext: trace LE edema Musculoskeletal:  No deformities, BUE and BLE strength normal and equal Skin: warm and dry  Neuro:  CNs 2-12 intact, no focal abnormalities noted Psych:  Normal affect    Relevant CV Studies: Echocardiogram 05/2019: 1. Left ventricular ejection fraction, by visual estimation, is 60 to  65%. The left ventricle has normal function. There is no left ventricular  hypertrophy.  2. Left ventricular diastolic parameters are consistent with Grade I  diastolic dysfunction (impaired relaxation).  3. The left ventricle has no regional wall motion abnormalities.  4. Global right ventricle has normal systolic function.The right  ventricular size is normal. No increase in right ventricular wall  thickness.  5. Left atrial size was normal.  6. Right atrial size was normal.  7. The mitral valve is grossly normal. No evidence of mitral valve  regurgitation.  8. The  tricuspid valve is not well visualized.  9. The tricuspid valve is  not well visualized. Tricuspid valve  regurgitation is not demonstrated.  10. The aortic valve is grossly normal. Aortic valve regurgitation is not  visualized. No evidence of aortic valve sclerosis or stenosis.  11. The pulmonic valve was normal in structure. Pulmonic valve  regurgitation is not visualized.  12. Aortic dilatation noted.  13. There is moderate dilatation of the ascending aorta measuring 41 mm.  14. The atrial septum is grossly normal.   NST 05/2019:  The left ventricular ejection fraction is hyperdynamic (>65%).  Nuclear stress EF: 68%.  There was no ST segment deviation noted during stress.  Defect 1: There is a medium defect of severe severity present in the mid anterolateral, apical anterior, apical septal, apical inferior, apical lateral and apex location. Mild perfusion defect with stress in the mid inferoseptum.   Medium size, severe perfusion defect with stress in the apex and mid anterolateral wall with partial reversibility to mild at the apex and mid anterolateral wall. There is also a mild stress perfusion defect in the mid inferoseptum with complete reversibility from mild to normal.  Findings consistent with ischemia.  Mild perfusion defect at the apex at rest may be due to old infarct versus attenuation artifact due to body habitus.  Recommend cardiology consultation.  Laboratory Data:  High Sensitivity Troponin:  No results for input(s): TROPONINIHS in the last 720 hours.    ChemistryNo results for input(s): NA, K, CL, CO2, GLUCOSE, BUN, CREATININE, CALCIUM, GFRNONAA, GFRAA, ANIONGAP in the last 168 hours.  No results for input(s): PROT, ALBUMIN, AST, ALT, ALKPHOS, BILITOT in the last 168 hours. HematologyNo results for input(s): WBC, RBC, HGB, HCT, MCV, MCH, MCHC, RDW, PLT in the last 168 hours. BNPNo results for input(s): BNP, PROBNP in the last 168 hours.  DDimer No results for  input(s): DDIMER in the last 168 hours.   Radiology/Studies:  No results found.   Assessment and Plan:    1. Abnormal NST: patient reported an episode of chest pain several months ago and chronic DOE. Underwent outpatient NST 05/2019 which revealed a medium size, severe perfusion defect. Seen by Dr. Stanford Breed and recommended for [facility]. He has CKD and Cr 06/22/19 was 2.7, up from 2.0 1 year ago, therefore overnight hydration was recommended prior to cath - Will start IVF hydration - monitor volume status closely - Will follow-up BMET in AM - Plan for LHC in AM - NPO after MN tonight - Continue aspirin and statin  2. HTN: BP 126/82 - Continue metoprolol succinate - Holding home losartan and lasix in anticipation of LHC tomorrow. Could consider discontinuation of losartan if Cr bumps with cath.  3. HLD: LDL 84 10/2018 - Continue pravastatin. Low threshold to transition to atorvastatin if significant disease noted on cath - Will check FLP with AM labs  4. DM type 2: A1C 9.2 04/2019; CKD limits addition of SGLT2 inhibitor - ISS while admitted - Continue glipizide  5. CKD stage 4: Cr 2.67 today - Will hydrate with IVFs. - Follow-up BMET in AM  6. RUQ nodule: noted to have an 1.8cm nodule anterior to the liver on CT Chest for aortic aneurysm monitoring. Currently ordered for a CT A/P to further evaluate findings - Outpatient CT A/P as previously planned  7. Ascending aortic aneurysm: 4cm on CT chest 06/2019. - Plan for routine annual monitoring   Severity of Illness: The appropriate patient status for this patient is INPATIENT. Inpatient status is judged to be reasonable and necessary in order to provide the  required intensity of service to ensure the patient's safety. The patient's presenting symptoms, physical exam findings, and initial radiographic and laboratory data in the context of their chronic comorbidities is felt to place them at high risk for further clinical deterioration.  Furthermore, it is not anticipated that the patient will be medically stable for discharge from the hospital within 2 midnights of admission. The following factors support the patient status of inpatient.   " The patient's presenting symptoms include DOE. " The worrisome physical exam findings include benign cardiopulmonary exam. " The initial radiographic and laboratory data are worrisome because of abnormal nuclear stress test. " The chronic co-morbidities include HTN, HLD, DM type 2.   * I certify that at the point of admission it is my clinical judgment that the patient will require inpatient hospital care spanning beyond 2 midnights from the point of admission due to high intensity of service, high risk for further deterioration and high frequency of surveillance required.*    For questions or updates, please contact [place] Please consult [email or web address] for contact info under        Signed, Abigail Butts, PA-C  06/30/2019 3:34 PM   Attending Note:   The patient was seen and examined.  Agree with assessment and plan as noted above.  Changes made to the above note as needed.  Patient seen and independently examined with Roby Lofts, PA.   We discussed all aspects of the encounter. I agree with the assessment and plan as stated above.  1.  CP .  Patient has had some chest pain.  He has severe shortness of breath with exertion.  Should be noted that he appears to be very deconditioned.  He does not exercise and basically does not move much through the day.  He had a Myoview study which revealed an reversible apical defect.  He has a history of CKD and is status post nephrectomy for renal cell carcinoma.  He is admitted tonight for precath hydration prior to heart catheterization tomorrow.  He has diabetes mellitus and his glucose levels have been extremely high.  Glucose levels a week ago with 377.  Today it is 215.  His Lasix and losartan have been held for several  days prior to the heart catheterization.  We will start IV hydration tonight.  I have discussed the risks, benefits, options concerning heart catheterization.  He would likely need a staged procedure if he is found to need a stent.  He is currently pain-free and has no questions.     I have spent a total of 40 minutes with patient reviewing hospital  notes , telemetry, EKGs, labs and examining patient as well as establishing an assessment and plan that was discussed with the patient. > 50% of time was spent in direct patient care.    Thayer Headings, Brooke Bonito., MD, [facility] 06/30/2019, 4:55 PM [address],  [place] Pager [phone]

## 2019-06-30 NOTE — Progress Notes (Signed)
Responded to consult for IV placement. Pt currently needs IV for IV fluids. 22g IV placed in left forearm as charted. Pt will need a second IV placed in RA in am for cath lab at 0700. To allow for decreased risk of infection and preservation of veins, 2nd IV was not placed at this time. Advised pt's nurse, Alver Fisher and asked that she pass along to night shift to place another IV consult about 0300; she verbalized understanding. VAST RN to share info with night shift VAST.

## 2019-06-30 NOTE — H&P (Signed)
Elevated creatinine 2.5 with abnormal nuclear with apical defect. Remote CP led to nuclear.

## 2019-07-01 ENCOUNTER — Other Ambulatory Visit: Payer: Self-pay | Admitting: Cardiology

## 2019-07-01 ENCOUNTER — Encounter (HOSPITAL_COMMUNITY)
Admission: RE | Disposition: A | Discharge status: Home / Self Care | Payer: Self-pay | Source: Ambulatory Visit | Attending: Cardiology

## 2019-07-01 ENCOUNTER — Ambulatory Visit (HOSPITAL_COMMUNITY)
Admission: RE | Admit: 2019-07-01 | Payer: Medicare Other | Source: Home / Self Care | Admitting: Interventional Cardiology

## 2019-07-01 DIAGNOSIS — E1122 Type 2 diabetes mellitus with diabetic chronic kidney disease: Secondary | ICD-10-CM

## 2019-07-01 DIAGNOSIS — I2510 Atherosclerotic heart disease of native coronary artery without angina pectoris: Secondary | ICD-10-CM | POA: Diagnosis not present

## 2019-07-01 DIAGNOSIS — N184 Chronic kidney disease, stage 4 (severe): Secondary | ICD-10-CM | POA: Diagnosis not present

## 2019-07-01 DIAGNOSIS — E109 Type 1 diabetes mellitus without complications: Secondary | ICD-10-CM

## 2019-07-01 DIAGNOSIS — R0600 Dyspnea, unspecified: Secondary | ICD-10-CM

## 2019-07-01 DIAGNOSIS — R9439 Abnormal result of other cardiovascular function study: Secondary | ICD-10-CM | POA: Diagnosis not present

## 2019-07-01 DIAGNOSIS — Z6841 Body Mass Index (BMI) 40.0 and over, adult: Secondary | ICD-10-CM | POA: Diagnosis not present

## 2019-07-01 DIAGNOSIS — Z20822 Contact with and (suspected) exposure to covid-19: Secondary | ICD-10-CM | POA: Diagnosis not present

## 2019-07-01 HISTORY — PX: LEFT HEART CATH AND CORONARY ANGIOGRAPHY: CATH118249

## 2019-07-01 LAB — CBC
HCT: 43.7 % (ref 39.0–52.0)
Hemoglobin: 14.2 g/dL (ref 13.0–17.0)
MCH: 32.2 pg (ref 26.0–34.0)
MCHC: 32.5 g/dL (ref 30.0–36.0)
MCV: 99.1 fL (ref 80.0–100.0)
Platelets: 143 10*3/uL — ABNORMAL LOW (ref 150–400)
RBC: 4.41 MIL/uL (ref 4.22–5.81)
RDW: 12.3 % (ref 11.5–15.5)
WBC: 6.1 10*3/uL (ref 4.0–10.5)
nRBC: 0 % (ref 0.0–0.2)

## 2019-07-01 LAB — BASIC METABOLIC PANEL
Anion gap: 12 (ref 5–15)
BUN: 33 mg/dL — ABNORMAL HIGH (ref 8–23)
CO2: 25 mmol/L (ref 22–32)
Calcium: 9 mg/dL (ref 8.9–10.3)
Chloride: 104 mmol/L (ref 98–111)
Creatinine, Ser: 2.4 mg/dL — ABNORMAL HIGH (ref 0.61–1.24)
GFR calc Af Amer: 29 mL/min — ABNORMAL LOW (ref >60)
GFR calc non Af Amer: 25 mL/min — ABNORMAL LOW (ref >60)
Glucose, Bld: 138 mg/dL — ABNORMAL HIGH (ref 70–99)
Potassium: 4 mmol/L (ref 3.5–5.1)
Sodium: 141 mmol/L (ref 135–145)

## 2019-07-01 LAB — LIPID PANEL
Cholesterol: 146 mg/dL (ref 0–200)
HDL: 24 mg/dL — ABNORMAL LOW (ref >40)
LDL Cholesterol: 68 mg/dL (ref 0–99)
Total CHOL/HDL Ratio: 6.1 RATIO
Triglycerides: 270 mg/dL — ABNORMAL HIGH (ref <150)
VLDL: 54 mg/dL — ABNORMAL HIGH (ref 0–40)

## 2019-07-01 LAB — GLUCOSE, CAPILLARY
Glucose-Capillary: 136 mg/dL — ABNORMAL HIGH (ref 70–99)
Glucose-Capillary: 147 mg/dL — ABNORMAL HIGH (ref 70–99)
Glucose-Capillary: 134 mg/dL — ABNORMAL HIGH (ref 70–99)
Glucose-Capillary: 176 mg/dL — ABNORMAL HIGH (ref 70–99)

## 2019-07-01 SURGERY — LEFT HEART CATH AND CORONARY ANGIOGRAPHY
Anesthesia: LOCAL

## 2019-07-01 MED ORDER — ONDANSETRON HCL 4 MG/2ML IJ SOLN: 4.0000 mg | Freq: Four times a day (QID) | INTRAMUSCULAR | Status: DC | PRN

## 2019-07-01 MED ORDER — SODIUM CHLORIDE 0.9% FLUSH: 3.0000 mL | Freq: Two times a day (BID) | INTRAVENOUS | Status: DC

## 2019-07-01 MED ORDER — LIDOCAINE HCL (PF) 1 % IJ SOLN
INTRAMUSCULAR | Status: AC
  Filled 2019-07-01: qty 30

## 2019-07-01 MED ORDER — SODIUM CHLORIDE 0.9 % WEIGHT BASED INFUSION: 1.0000 mL/kg/h | INTRAVENOUS | Status: DC

## 2019-07-01 MED ORDER — HEPARIN SODIUM (PORCINE) 1000 UNIT/ML IJ SOLN
INTRAMUSCULAR | Status: AC
  Filled 2019-07-01: qty 1

## 2019-07-01 MED ORDER — ASPIRIN EC 81 MG PO TBEC: 81.0000 mg | DELAYED_RELEASE_TABLET | Freq: Every day | ORAL | 3 refills | Status: DC

## 2019-07-01 MED ORDER — ACETAMINOPHEN 325 MG PO TABS: 650.0000 mg | ORAL_TABLET | ORAL | Status: DC | PRN

## 2019-07-01 MED ORDER — HEPARIN SODIUM (PORCINE) 5000 UNIT/ML IJ SOLN: 5000.0000 [IU] | Freq: Three times a day (TID) | INTRAMUSCULAR | Status: DC

## 2019-07-01 MED ORDER — SODIUM CHLORIDE 0.9% FLUSH: 3.0000 mL | INTRAVENOUS | Status: DC | PRN

## 2019-07-01 MED ORDER — VERAPAMIL HCL 2.5 MG/ML IV SOLN
INTRAVENOUS | Status: AC
  Filled 2019-07-01: qty 2

## 2019-07-01 MED ORDER — LABETALOL HCL 5 MG/ML IV SOLN: 10.0000 mg | INTRAVENOUS | Status: AC | PRN

## 2019-07-01 MED ORDER — MIDAZOLAM HCL 2 MG/2ML IJ SOLN
INTRAMUSCULAR | Status: DC | PRN
  Administered 2019-07-01: 1 mg via INTRAVENOUS

## 2019-07-01 MED ORDER — FENTANYL CITRATE (PF) 100 MCG/2ML IJ SOLN
INTRAMUSCULAR | Status: DC | PRN
  Administered 2019-07-01: 25 ug via INTRAVENOUS

## 2019-07-01 MED ORDER — ATORVASTATIN CALCIUM 80 MG PO TABS
80.0000 mg | ORAL_TABLET | Freq: Every day | ORAL | Status: DC
  Administered 2019-07-01: 80 mg via ORAL
  Filled 2019-07-01: qty 1

## 2019-07-01 MED ORDER — ATORVASTATIN CALCIUM 80 MG PO TABS: 80.0000 mg | ORAL_TABLET | Freq: Every day | ORAL | 3 refills | Status: DC

## 2019-07-01 MED ORDER — HEPARIN (PORCINE) IN NACL 1000-0.9 UT/500ML-% IV SOLN
INTRAVENOUS | Status: AC
  Filled 2019-07-01: qty 1000

## 2019-07-01 MED ORDER — ASPIRIN 81 MG PO CHEW: 81.0000 mg | CHEWABLE_TABLET | Freq: Every day | ORAL | Status: DC

## 2019-07-01 MED ORDER — OXYCODONE HCL 5 MG PO TABS: 5.0000 mg | ORAL_TABLET | ORAL | Status: DC | PRN

## 2019-07-01 MED ORDER — FENTANYL CITRATE (PF) 100 MCG/2ML IJ SOLN
INTRAMUSCULAR | Status: AC
  Filled 2019-07-01: qty 2

## 2019-07-01 MED ORDER — SODIUM CHLORIDE 0.9 % IV SOLN: 250.0000 mL | INTRAVENOUS | Status: DC | PRN

## 2019-07-01 MED ORDER — LIDOCAINE HCL (PF) 1 % IJ SOLN
INTRAMUSCULAR | Status: DC | PRN
  Administered 2019-07-01 (×2): 2 mL

## 2019-07-01 MED ORDER — HYDRALAZINE HCL 20 MG/ML IJ SOLN: 10.0000 mg | INTRAMUSCULAR | Status: AC | PRN

## 2019-07-01 MED ORDER — HEPARIN (PORCINE) IN NACL 1000-0.9 UT/500ML-% IV SOLN
INTRAVENOUS | Status: DC | PRN
  Administered 2019-07-01 (×2): 500 mL

## 2019-07-01 MED ORDER — HEPARIN SODIUM (PORCINE) 1000 UNIT/ML IJ SOLN
INTRAMUSCULAR | Status: DC | PRN
  Administered 2019-07-01: 5000 [IU] via INTRAVENOUS

## 2019-07-01 MED ORDER — VERAPAMIL HCL 2.5 MG/ML IV SOLN
INTRAVENOUS | Status: DC | PRN
  Administered 2019-07-01: 10 mL via INTRA_ARTERIAL

## 2019-07-01 MED ORDER — MIDAZOLAM HCL 2 MG/2ML IJ SOLN
INTRAMUSCULAR | Status: AC
  Filled 2019-07-01: qty 2

## 2019-07-01 MED ORDER — IOHEXOL 350 MG/ML SOLN
INTRAVENOUS | Status: DC | PRN
  Administered 2019-07-01: 50 mL

## 2019-07-01 SURGICAL SUPPLY — 12 items
CATH 5FR JL3.5 JR4 ANG PIG MP (CATHETERS) ×1 IMPLANT
CATH LAUNCHER 5F RADR (CATHETERS) IMPLANT
CATHETER LAUNCHER 5F RADR (CATHETERS) ×2
DEVICE RAD COMP TR BAND LRG (VASCULAR PRODUCTS) ×1 IMPLANT
GLIDESHEATH SLEND A-KIT 6F 22G (SHEATH) ×2 IMPLANT
GUIDEWIRE INQWIRE 1.5J.035X260 (WIRE) IMPLANT
INQWIRE 1.5J .035X260CM (WIRE) ×2
KIT HEART LEFT (KITS) ×2 IMPLANT
PACK CARDIAC CATHETERIZATION (CUSTOM PROCEDURE TRAY) ×2 IMPLANT
SHEATH PROBE COVER 6X72 (BAG) ×1 IMPLANT
TRANSDUCER W/STOPCOCK (MISCELLANEOUS) ×2 IMPLANT
TUBING CIL FLEX 10 FLL-RA (TUBING) ×2 IMPLANT

## 2019-07-01 NOTE — Interval H&P Note (Signed)
Cath Lab Visit (complete for each Cath Lab visit)  Clinical Evaluation Leading to the Procedure:   ACS: No.  Non-ACS:    Anginal Classification: CCS II  Anti-ischemic medical therapy: Minimal Therapy (1 class of medications)  Non-Invasive Test Results: Intermediate-risk stress test findings: cardiac mortality 1-3%/year  Prior CABG: No previous CABG      History and Physical Interval Note:  07/01/2019 10:04 AM  Clinton Ortiz  has presented today for surgery, with the diagnosis of chest pain - abnormal nuc.  The various methods of treatment have been discussed with the patient and family. After consideration of risks, benefits and other options for treatment, the patient has consented to  Procedure(s): LEFT HEART CATH AND CORONARY ANGIOGRAPHY (N/A) as a surgical intervention.  The patient's history has been reviewed, patient examined, no change in status, stable for surgery.  I have reviewed the patient's chart and labs.  Questions were answered to the patient's satisfaction.     Belva Crome III

## 2019-07-01 NOTE — CV Procedure (Signed)
   Very difficult coronary angiography from right radial approach due to innominate artery/aortic angulation.  Real-time vascular ultrasound used for right radial access.  Less than 50 cc of contrast was used.  Eccentric 60 to 70% distal left main before trifurcation into a ramus, LAD, and circumflex.  Circumflex contains 60 to 70% ostial to proximal narrowing.  LAD contains 50% proximal narrowing.  RCA is relatively small, has a downward origin from the aorta, and contains ostial 40% narrowing.  No catheter damping.  LVEDP is normal.  Please refer to echo report for LVEF.  We will speak with Dr. Kirk Ruths, his primary cardiologist who recommended coronary angiography.  The patient should be seen by T CTS to consider coronary bypass grafting.  Hydration this afternoon, repeat basic metabolic panel on Monday.

## 2019-07-01 NOTE — H&P (View-Only) (Signed)
Progress Note  Patient Name: Clinton Ortiz Date of Encounter: 07/01/2019  Primary Cardiologist: Kirk Ruths, MD   Subjective   No chest pain overnight. Continues to complain of chronic dyspnea on minimal exertion.   Inpatient Medications    Scheduled Meds: . azelastine  2 spray Each Nare Daily  . fluticasone furoate-vilanterol  1 puff Inhalation Daily   And  . umeclidinium bromide  1 puff Inhalation Daily  . gabapentin  300 mg Oral Daily  . gabapentin  600 mg Oral QHS  . glipiZIDE  10 mg Oral BID AC  . heparin  5,000 Units Subcutaneous Q8H  . insulin aspart  0-15 Units Subcutaneous TID WC  . insulin aspart  0-5 Units Subcutaneous QHS  . metoprolol succinate  25 mg Oral Daily  . pantoprazole  40 mg Oral Daily  . pravastatin  40 mg Oral QHS  . sodium chloride flush  3 mL Intravenous Q12H  . tamsulosin  0.8 mg Oral Daily   Continuous Infusions: . sodium chloride    . sodium chloride 1 mL/kg/hr (07/01/19 0239)   PRN Meds: sodium chloride, acetaminophen, nitroGLYCERIN, ondansetron (ZOFRAN) IV, sodium chloride flush, traMADol   Vital Signs    Vitals:   06/30/19 1922 06/30/19 2048 07/01/19 0345 07/01/19 0346  BP:  117/77 116/77   Pulse: 64 63 64   Resp:  16 16   Temp:  98 F (36.7 C) 97.7 F (36.5 C)   TempSrc:  Oral Oral   SpO2: 97% 95% 97%   Weight:    117.3 kg  Height:        Intake/Output Summary (Last 24 hours) at 07/01/2019 0810 Last data filed at 07/01/2019 0505 Gross per 24 hour  Intake 1451.94 ml  Output 650 ml  Net 801.94 ml   Last 3 Weights 07/01/2019 06/30/2019 06/22/2019  Weight (lbs) 258 lb 9.6 oz 253 lb 8 oz 264 lb  Weight (kg) 117.3 kg 114.987 kg 119.75 kg      Telemetry    Sinus rhythm, 50's-60's - Personally Reviewed  ECG    Normal sinus rhythm, 61 bpm, Low voltage QRS, no ST/T changes - Personally Reviewed  Physical Exam   GEN: No acute distress.   Neck: No JVD Cardiac: RRR, no murmurs, rubs, or gallops.  Respiratory: Clear to  auscultation bilaterally. GI: Soft, nontender, non-distended  MS: No edema; No deformity. Neuro:  Nonfocal  Psych: Normal affect   Labs    High Sensitivity Troponin:  No results for input(s): TROPONINIHS in the last 720 hours.    Chemistry Recent Labs  Lab 06/30/19 1537 07/01/19 0318  NA 138 141  K 4.3 4.0  CL 105 104  CO2 25 25  GLUCOSE 215* 138*  BUN 37* 33*  CREATININE 2.67* 2.40*  CALCIUM 9.1 9.0  GFRNONAA 22* 25*  GFRAA 26* 29*  ANIONGAP 8 12     Hematology Recent Labs  Lab 06/30/19 1537  WBC 7.7  RBC 4.74  HGB 15.3  HCT 45.4  MCV 95.8  MCH 32.3  MCHC 33.7  RDW 12.1  PLT 178    BNPNo results for input(s): BNP, PROBNP in the last 168 hours.   DDimer No results for input(s): DDIMER in the last 168 hours.   Radiology    No results found.  Cardiac Studies   Lexiscan Myoview 05/30/2019 Study Highlights   The left ventricular ejection fraction is hyperdynamic (>65%).  Nuclear stress EF: 68%.  There was no ST segment deviation noted during  stress.  Defect 1: There is a medium defect of severe severity present in the mid anterolateral, apical anterior, apical septal, apical inferior, apical lateral and apex location. Mild perfusion defect with stress in the mid inferoseptum.   Medium size, severe perfusion defect with stress in the apex and mid anterolateral wall with partial reversibility to mild at the apex and mid anterolateral wall. There is also a mild stress perfusion defect in the mid inferoseptum with complete reversibility from mild to normal.  Findings consistent with ischemia.  Mild perfusion defect at the apex at rest may be due to old infarct versus attenuation artifact due to body habitus.  Recommend cardiology consultation.    Echocardiogram 05/31/2019 IMPRESSIONS  1. Left ventricular ejection fraction, by visual estimation, is 60 to  65%. The left ventricle has normal function. There is no left ventricular  hypertrophy.  2.  Left ventricular diastolic parameters are consistent with Grade I  diastolic dysfunction (impaired relaxation).  3. The left ventricle has no regional wall motion abnormalities.  4. Global right ventricle has normal systolic function.The right  ventricular size is normal. No increase in right ventricular wall  thickness.  5. Left atrial size was normal.  6. Right atrial size was normal.  7. The mitral valve is grossly normal. No evidence of mitral valve  regurgitation.  8. The tricuspid valve is not well visualized.  9. The tricuspid valve is not well visualized. Tricuspid valve  regurgitation is not demonstrated.  10. The aortic valve is grossly normal. Aortic valve regurgitation is not  visualized. No evidence of aortic valve sclerosis or stenosis.  11. The pulmonic valve was normal in structure. Pulmonic valve  regurgitation is not visualized.  12. Aortic dilatation noted.  13. There is moderate dilatation of the ascending aorta measuring 41 mm.  14. The atrial septum is grossly normal.     Patient Profile     79 y.o. male with PMH of HTN, HLD, DM type 2, GERD, COPD, tongue cancer, CKD stage 4 and kidney cancer s/p nephrectomy, who had abnormal myoview with apical defect. Plan for cardiac cath and possible PCI.  He was brought in for pre hydration due to elevated SCr.  Assessment & Plan    Abnormal Nuclear stress test -Pt with history of chronic DOE, had an episode of chest pain several months ago. No current chest pain. -CVD risk factors include HTN, HLD, DM (10-12 yrs), Obesity, Age. -Apical defect noted on NST. -Plan for LHC today. Pt with CKD stage 4. On IV hydration for Renal support.  -continues on statin and aspirin.   CKD stage 4 -Hx of kidney cancer and left nephrectomy. -SCr had been 2.09 last year. Up to 2.74 on 06/22/19. SCr 2.67 yesterday. Pt hydrated overnight and losartan held. SCr 2.40 today.  -Plan to limit contrast with cath.    Hypertension -Continues on Toprol XL. -Holding losartan and lasix in anticipation of LHC.  -BP currently well controlled.   Hyperlipidemia -On Pravastatin. LDL 68. -Would consider transition to high intensity statin if considerable disease noted on cath.   DM type 2 -On SSI while in hospital  RUQ nodule -noted to have an 1.8cm nodule anterior to the liver on CT Chest for aortic aneurysm monitoring. Currently ordered for a CT A/P to further evaluate findings - Outpatient CT A/P as previously planned  Ascending aortic aneurysm -4cm on CT chest 06/2019. -Plan for routine annual monitoring    For questions or updates, please contact [place] Please  consult [email or web address] for contact info under        Signed, Daune Perch, NP  07/01/2019, 8:10 AM    Attending Note:   The patient was seen and examined.  Agree with assessment and plan as noted above.  Changes made to the above note as needed.  Patient seen and independently examined with  Pecolia Ades, NP .   We discussed all aspects of the encounter. I agree with the assessment and plan as stated above.  1.    DOE:   Had an abnormal Myoview study suggestive of apical ischemia.  He is scheduled for heart catheterization today with limited contrast.  He has chronic kidney disease stage IV.  He was admitted last night for IV hydration.  His creatinine has improved from 2.67-2.4 today.  We have discussed the details of heart catheterization including risks, benefits, options.  He understands and agrees to proceed.  2.  Hypertension: Continue Toprol-XL.  Losartan and Lasix are on hold for this heart catheterization.  3.  Hyperlipidemia: Currently on pravastatin.  If he has significant CAD will change him to either atorvastatin or rosuvastatin.  4.  Diabetes mellitus: He has been on sliding scale insulin.  His diabetes has not been well controlled and he has had glucose levels in the 300-400 range.  We talked about diet last  night.  He eats a lot of starches including potatoes and breads.  5.  Morbid obesity:   Advised more exercise and weight loss    I have spent a total of 40 minutes with patient reviewing hospital  notes , telemetry, EKGs, labs and examining patient as well as establishing an assessment and plan that was discussed with the patient. > 50% of time was spent in direct patient care.   Thayer Headings, Brooke Bonito., MD, [facility] 07/01/2019, 9:26 AM [address],  [place] Pager [phone]

## 2019-07-01 NOTE — Progress Notes (Signed)
Inpatient Diabetes Program Recommendations  AACE/ADA: New Consensus Statement on Inpatient Glycemic Control (2015)  Target Ranges:  Prepandial:   less than 140 mg/dL      Peak postprandial:   less than 180 mg/dL (1-2 hours)      Critically ill patients:  140 - 180 mg/dL   Lab Results  Component Value Date   GLUCAP 134 (H) 07/01/2019   HGBA1C 9.2 (A) 04/13/2019    Review of Glycemic Control  Results for Clinton Ortiz, Clinton Ortiz (MRN [id number]) as of 07/01/2019 14:44  Ref. Range 06/30/2019 21:03 07/01/2019 06:14 07/01/2019 07:40 07/01/2019 11:32  Glucose-Capillary Latest Ref Range: 70 - 99 mg/dL 162 (H) 136 (H) 147 (H) 134 (H)    Diabetes history: DM2 Outpatient Diabetes medications: Trulicity 4.5 mg Q week + Glipizide 10 mg BID Current orders for Inpatient glycemic control: Novolog 0-15 TID + 0-5 QHS + Glipizide 10 mg QD  Note:  Spoke with patient at bedside.  Reviewed patient's current A1c of 9.2%-average 220mg /dl. Explained what a A1c is and what it measures. Also reviewed goal A1c with patient, importance of good glucose control @ home, and blood sugar goals.  He denies difficulties obtaining medications.  Confirmed patient taking above home medications.  He is closely followed PCP, Dr. Darene Lamer in [place] (he cannot remember how to say or spell name).  Reviewed Plate Method, CHO and CHO intake recommendations.  He checks his CBG 4 x a day. Patient states he may discharge today and come back Monday for further cardiac testing.  CBG's trending well.  Thank you, Reche Dixon, RN, BSN Diabetes Coordinator Inpatient Diabetes Program [phone] (team pager from 8a-5p)

## 2019-07-01 NOTE — Progress Notes (Addendum)
Progress Note  Patient Name: Clinton Ortiz Date of Encounter: 07/01/2019  Primary Cardiologist: Kirk Ruths, MD   Subjective   No chest pain overnight. Continues to complain of chronic dyspnea on minimal exertion.   Inpatient Medications    Scheduled Meds: . azelastine  2 spray Each Nare Daily  . fluticasone furoate-vilanterol  1 puff Inhalation Daily   And  . umeclidinium bromide  1 puff Inhalation Daily  . gabapentin  300 mg Oral Daily  . gabapentin  600 mg Oral QHS  . glipiZIDE  10 mg Oral BID AC  . heparin  5,000 Units Subcutaneous Q8H  . insulin aspart  0-15 Units Subcutaneous TID WC  . insulin aspart  0-5 Units Subcutaneous QHS  . metoprolol succinate  25 mg Oral Daily  . pantoprazole  40 mg Oral Daily  . pravastatin  40 mg Oral QHS  . sodium chloride flush  3 mL Intravenous Q12H  . tamsulosin  0.8 mg Oral Daily   Continuous Infusions: . sodium chloride    . sodium chloride 1 mL/kg/hr (07/01/19 0239)   PRN Meds: sodium chloride, acetaminophen, nitroGLYCERIN, ondansetron (ZOFRAN) IV, sodium chloride flush, traMADol   Vital Signs    Vitals:   06/30/19 1922 06/30/19 2048 07/01/19 0345 07/01/19 0346  BP:  117/77 116/77   Pulse: 64 63 64   Resp:  16 16   Temp:  98 F (36.7 C) 97.7 F (36.5 C)   TempSrc:  Oral Oral   SpO2: 97% 95% 97%   Weight:    117.3 kg  Height:        Intake/Output Summary (Last 24 hours) at 07/01/2019 0810 Last data filed at 07/01/2019 0505 Gross per 24 hour  Intake 1451.94 ml  Output 650 ml  Net 801.94 ml   Last 3 Weights 07/01/2019 06/30/2019 06/22/2019  Weight (lbs) 258 lb 9.6 oz 253 lb 8 oz 264 lb  Weight (kg) 117.3 kg 114.987 kg 119.75 kg      Telemetry    Sinus rhythm, 50's-60's - Personally Reviewed  ECG    Normal sinus rhythm, 61 bpm, Low voltage QRS, no ST/T changes - Personally Reviewed  Physical Exam   GEN: No acute distress.   Neck: No JVD Cardiac: RRR, no murmurs, rubs, or gallops.  Respiratory: Clear to  auscultation bilaterally. GI: Soft, nontender, non-distended  MS: No edema; No deformity. Neuro:  Nonfocal  Psych: Normal affect   Labs    High Sensitivity Troponin:  No results for input(s): TROPONINIHS in the last 720 hours.    Chemistry Recent Labs  Lab 06/30/19 1537 07/01/19 0318  NA 138 141  K 4.3 4.0  CL 105 104  CO2 25 25  GLUCOSE 215* 138*  BUN 37* 33*  CREATININE 2.67* 2.40*  CALCIUM 9.1 9.0  GFRNONAA 22* 25*  GFRAA 26* 29*  ANIONGAP 8 12     Hematology Recent Labs  Lab 06/30/19 1537  WBC 7.7  RBC 4.74  HGB 15.3  HCT 45.4  MCV 95.8  MCH 32.3  MCHC 33.7  RDW 12.1  PLT 178    BNPNo results for input(s): BNP, PROBNP in the last 168 hours.   DDimer No results for input(s): DDIMER in the last 168 hours.   Radiology    No results found.  Cardiac Studies   Lexiscan Myoview 05/30/2019 Study Highlights   The left ventricular ejection fraction is hyperdynamic (>65%).  Nuclear stress EF: 68%.  There was no ST segment deviation noted during  stress.  Defect 1: There is a medium defect of severe severity present in the mid anterolateral, apical anterior, apical septal, apical inferior, apical lateral and apex location. Mild perfusion defect with stress in the mid inferoseptum.   Medium size, severe perfusion defect with stress in the apex and mid anterolateral wall with partial reversibility to mild at the apex and mid anterolateral wall. There is also a mild stress perfusion defect in the mid inferoseptum with complete reversibility from mild to normal.  Findings consistent with ischemia.  Mild perfusion defect at the apex at rest may be due to old infarct versus attenuation artifact due to body habitus.  Recommend cardiology consultation.    Echocardiogram 05/31/2019 IMPRESSIONS  1. Left ventricular ejection fraction, by visual estimation, is 60 to  65%. The left ventricle has normal function. There is no left ventricular  hypertrophy.  2.  Left ventricular diastolic parameters are consistent with Grade I  diastolic dysfunction (impaired relaxation).  3. The left ventricle has no regional wall motion abnormalities.  4. Global right ventricle has normal systolic function.The right  ventricular size is normal. No increase in right ventricular wall  thickness.  5. Left atrial size was normal.  6. Right atrial size was normal.  7. The mitral valve is grossly normal. No evidence of mitral valve  regurgitation.  8. The tricuspid valve is not well visualized.  9. The tricuspid valve is not well visualized. Tricuspid valve  regurgitation is not demonstrated.  10. The aortic valve is grossly normal. Aortic valve regurgitation is not  visualized. No evidence of aortic valve sclerosis or stenosis.  11. The pulmonic valve was normal in structure. Pulmonic valve  regurgitation is not visualized.  12. Aortic dilatation noted.  13. There is moderate dilatation of the ascending aorta measuring 41 mm.  14. The atrial septum is grossly normal.     Patient Profile     79 y.o. male with PMH of HTN, HLD, DM type 2, GERD, COPD, tongue cancer, CKD stage 4 and kidney cancer s/p nephrectomy, who had abnormal myoview with apical defect. Plan for cardiac cath and possible PCI.  He was brought in for pre hydration due to elevated SCr.  Assessment & Plan    Abnormal Nuclear stress test -Pt with history of chronic DOE, had an episode of chest pain several months ago. No current chest pain. -CVD risk factors include HTN, HLD, DM (10-12 yrs), Obesity, Age. -Apical defect noted on NST. -Plan for LHC today. Pt with CKD stage 4. On IV hydration for Renal support.  -continues on statin and aspirin.   CKD stage 4 -Hx of kidney cancer and left nephrectomy. -SCr had been 2.09 last year. Up to 2.74 on 06/22/19. SCr 2.67 yesterday. Pt hydrated overnight and losartan held. SCr 2.40 today.  -Plan to limit contrast with cath.    Hypertension -Continues on Toprol XL. -Holding losartan and lasix in anticipation of LHC.  -BP currently well controlled.   Hyperlipidemia -On Pravastatin. LDL 68. -Would consider transition to high intensity statin if considerable disease noted on cath.   DM type 2 -On SSI while in hospital  RUQ nodule -noted to have an 1.8cm nodule anterior to the liver on CT Chest for aortic aneurysm monitoring. Currently ordered for a CT A/P to further evaluate findings - Outpatient CT A/P as previously planned  Ascending aortic aneurysm -4cm on CT chest 06/2019. -Plan for routine annual monitoring    For questions or updates, please contact [place] Please  consult [email or web address] for contact info under        Signed, Daune Perch, NP  07/01/2019, 8:10 AM    Attending Note:   The patient was seen and examined.  Agree with assessment and plan as noted above.  Changes made to the above note as needed.  Patient seen and independently examined with  Pecolia Ades, NP .   We discussed all aspects of the encounter. I agree with the assessment and plan as stated above.  1.    DOE:   Had an abnormal Myoview study suggestive of apical ischemia.  He is scheduled for heart catheterization today with limited contrast.  He has chronic kidney disease stage IV.  He was admitted last night for IV hydration.  His creatinine has improved from 2.67-2.4 today.  We have discussed the details of heart catheterization including risks, benefits, options.  He understands and agrees to proceed.  2.  Hypertension: Continue Toprol-XL.  Losartan and Lasix are on hold for this heart catheterization.  3.  Hyperlipidemia: Currently on pravastatin.  If he has significant CAD will change him to either atorvastatin or rosuvastatin.  4.  Diabetes mellitus: He has been on sliding scale insulin.  His diabetes has not been well controlled and he has had glucose levels in the 300-400 range.  We talked about diet last  night.  He eats a lot of starches including potatoes and breads.  5.  Morbid obesity:   Advised more exercise and weight loss    I have spent a total of 40 minutes with patient reviewing hospital  notes , telemetry, EKGs, labs and examining patient as well as establishing an assessment and plan that was discussed with the patient. > 50% of time was spent in direct patient care.   Thayer Headings, Brooke Bonito., MD, [facility] 07/01/2019, 9:26 AM [address],  [place] Pager [phone]

## 2019-07-01 NOTE — Discharge Instructions (Signed)
Radial Site Care ° °This sheet gives you information about how to care for yourself after your procedure. Your health care provider may also give you more specific instructions. If you have problems or questions, contact your health care provider. °What can I expect after the procedure? °After the procedure, it is common to have: °· Bruising and tenderness at the catheter insertion area. °Follow these instructions at home: °Medicines °· Take over-the-counter and prescription medicines only as told by your health care provider. °Insertion site care °· Follow instructions from your health care provider about how to take care of your insertion site. Make sure you: °? Wash your hands with soap and water before you change your bandage (dressing). If soap and water are not available, use hand sanitizer. °? Change your dressing as told by your health care provider. °? Leave stitches (sutures), skin glue, or adhesive strips in place. These skin closures may need to stay in place for 2 weeks or longer. If adhesive strip edges start to loosen and curl up, you may trim the loose edges. Do not remove adhesive strips completely unless your health care provider tells you to do that. °· Check your insertion site every day for signs of infection. Check for: °? Redness, swelling, or pain. °? Fluid or blood. °? Pus or a bad smell. °? Warmth. °· Do not take baths, swim, or use a hot tub until your health care provider approves. °· You may shower 24-48 hours after the procedure, or as directed by your health care provider. °? Remove the dressing and gently wash the site with plain soap and water. °? Pat the area dry with a clean towel. °? Do not rub the site. That could cause bleeding. °· Do not apply powder or lotion to the site. °Activity ° °· For 24 hours after the procedure, or as directed by your health care provider: °? Do not flex or bend the affected arm. °? Do not push or pull heavy objects with the affected arm. °? Do not  drive yourself home from the hospital or clinic. You may drive 24 hours after the procedure unless your health care provider tells you not to. °? Do not operate machinery or power tools. °· Do not lift anything that is heavier than 10 lb (4.5 kg), or the limit that you are told, until your health care provider says that it is safe. °· Ask your health care provider when it is okay to: °? Return to work or school. °? Resume usual physical activities or sports. °? Resume sexual activity. °General instructions °· If the catheter site starts to bleed, raise your arm and put firm pressure on the site. If the bleeding does not stop, get help right away. This is a medical emergency. °· If you went home on the same day as your procedure, a responsible adult should be with you for the first 24 hours after you arrive home. °· Keep all follow-up visits as told by your health care provider. This is important. °Contact a health care provider if: °· You have a fever. °· You have redness, swelling, or yellow drainage around your insertion site. °Get help right away if: °· You have unusual pain at the radial site. °· The catheter insertion area swells very fast. °· The insertion area is bleeding, and the bleeding does not stop when you hold steady pressure on the area. °· Your arm or hand becomes pale, cool, tingly, or numb. °These symptoms may represent a serious problem   that is an emergency. Do not wait to see if the symptoms will go away. Get medical help right away. Call your local emergency services (911 in the [place] Do not drive yourself to the hospital. Summary  After the procedure, it is common to have bruising and tenderness at the site.  Follow instructions from your health care provider about how to take care of your radial site wound. Check the wound every day for signs of infection.  Do not lift anything that is heavier than 10 lb (4.5 kg), or the limit that you are told, until your health care provider says  that it is safe. This information is not intended to replace advice given to you by your health care provider. Make sure you discuss any questions you have with your health care provider. Document Revised: 06/03/2017 Document Reviewed: 06/03/2017 Elsevier Patient Education  [place].    Coronary Artery Disease, Male Coronary artery disease (CAD) is a condition in which the arteries that lead to the heart (coronary arteries) become narrow or blocked. The narrowing or blockage can lead to decreased blood flow to the heart. Prolonged reduced blood flow can cause a heart attack (myocardial infarction or MI). This condition may also be called coronary heart disease. Because CAD is the leading cause of death in men, it is important to understand what causes this condition and how it is treated. What are the causes? CAD is most often caused by atherosclerosis. This is the buildup of fat and cholesterol (plaque) on the inside of the arteries. Over time, the plaque may narrow or block the artery, reducing blood flow to the heart. Plaque can also become weak and break off within a coronary artery and cause a sudden blockage. Other less common causes of CAD include:  A blood clot or a piece of a blood clot or other substance that blocks the flow of blood in a coronary artery (embolism).  A tearing of the artery (spontaneous coronary artery dissection).  An enlargement of an artery (aneurysm).  Inflammation (vasculitis) in the artery wall. What increases the risk? The following factors may make you more likely to develop this condition:  Age. Men over age 2 are at a greater risk of CAD.  Family history of CAD.  Gender. Men often develop CAD earlier in life than women.  High blood pressure (hypertension).  Diabetes.  High cholesterol levels.  Tobacco use.  Excessive alcohol use.  Lack of exercise.  A diet high in saturated and trans fats, such as fried food and processed  meat. Other possible risk factors include:  High stress levels.  Depression.  Obesity.  Sleep apnea. What are the signs or symptoms? Many people do not have any symptoms during the early stages of CAD. As the condition progresses, symptoms may include:  Chest pain (angina). The pain can: ? Feel like crushing or squeezing, or like a tightness, pressure, fullness, or heaviness in the chest. ? Last more than a few minutes or can stop and recur. The pain tends to get worse with exercise or stress and to fade with rest.  Pain in the arms, neck, jaw, ear, or back.  Unexplained heartburn or indigestion.  Shortness of breath.  Nausea or vomiting.  Sudden light-headedness.  Sudden cold sweats.  Fluttering or fast heartbeat (palpitations). How is this diagnosed? This condition is diagnosed based on:  Your family and medical history.  A physical exam.  Tests, including: ? A test to check the electrical signals in  your heart (electrocardiogram). ? Exercise stress test. This looks for signs of blockage when the heart is stressed with exercise, such as running on a treadmill. ? Pharmacologic stress test. This test looks for signs of blockage when the heart is being stressed with a medicine. ? Blood tests. ? Coronary angiogram. This is a procedure to look at the coronary arteries to see if there is any blockage. During this test, a dye is injected into your arteries so they appear on an X-ray. ? Coronary artery CT scan. This CT scan helps detect calcium deposits in your coronary arteries. Calcium deposits are an indicator of CAD. ? A test that uses sound waves to take a picture of your heart (echocardiogram). ? Chest X-ray. How is this treated? This condition may be treated by:  Healthy lifestyle changes to reduce risk factors.  Medicines such as: ? Antiplatelet medicines and blood-thinning medicines, such as aspirin. These help to prevent blood clots. ? Nitroglycerin. ? Blood  pressure medicines. ? Cholesterol-lowering medicine.  Coronary angioplasty and stenting. During this procedure, a thin, flexible tube is inserted through a blood vessel and into a blocked artery. A balloon or similar device on the end of the tube is inflated to open up the artery. In some cases, a small, mesh tube (stent) is inserted into the artery to keep it open.  Coronary artery bypass surgery. During this surgery, veins or arteries from other parts of the body are used to create a bypass around the blockage and allow blood to reach your heart. Follow these instructions at home: Medicines  Take over-the-counter and prescription medicines only as told by your health care provider.  Do not take the following medicines unless your health care provider approves: ? NSAIDs, such as ibuprofen, naproxen, or celecoxib. ? Vitamin supplements that contain vitamin A, vitamin E, or both. Lifestyle  Follow an exercise program approved by your health care provider. Aim for 150 minutes of moderate exercise or 75 minutes of vigorous exercise each week.  Maintain a healthy weight or lose weight as approved by your health care provider.  Learn to manage stress or try to limit your stress. Ask your health care provider for suggestions if you need help.  Get screened for depression and seek treatment, if needed.  Do not use any products that contain nicotine or tobacco, such as cigarettes, e-cigarettes, and chewing tobacco. If you need help quitting, ask your health care provider.  Do not use illegal drugs. Eating and drinking   Follow a heart-healthy diet. A dietitian can help educate you about healthy food options and changes. In general, eat plenty of fruits and vegetables, lean meats, and whole grains.  Avoid foods high in: ? Sugar. ? Salt (sodium). ? Saturated fat, such as processed or fatty meat. ? Trans fat, such as fried foods.  Use healthy cooking methods such as roasting, grilling,  broiling, baking, poaching, steaming, or stir-frying.  Do not drink alcohol if your health care provider tells you not to drink.  If you drink alcohol: ? Limit how much you have to 0-2 drinks per day. ? Be aware of how much alcohol is in your drink. In the [place] one drink equals one 12 oz bottle of beer (355 mL), one 5 oz glass of wine (148 mL), or one 1 oz glass of hard liquor (44 mL). General instructions  Manage any other health conditions, such as hypertension and diabetes. These conditions affect your heart.  Your health care provider may ask you  to monitor your blood pressure. Ideally, your blood pressure should be below 130/80.  Keep all follow-up visits as told by your health care provider. This is important. Get help right away if:  You have pain in your chest, neck, ear, arm, jaw, stomach, or back that: ? Lasts more than a few minutes. ? Is recurring. ? Is not relieved by taking medicine under your tongue (sublingual nitroglycerin).  You have profuse sweating without cause.  You have unexplained: ? Heartburn or indigestion. ? Shortness of breath or difficulty breathing. ? Fluttering or fast heartbeat (palpitations). ? Nausea or vomiting. ? Fatigue. ? Feelings of nervousness or anxiety. ? Weakness. ? Diarrhea.  You have sudden light-headedness or dizziness.  You faint.  You feel like hurting yourself or think about taking your own life. These symptoms may represent a serious problem that is an emergency. Do not wait to see if the symptoms will go away. Get medical help right away. Call your local emergency services (911 in the [place] Do not drive yourself to the hospital. Summary  Coronary artery disease (CAD) is a condition in which the arteries that lead to the heart (coronary arteries) become narrow or blocked. The narrowing or blockage can lead to a heart attack.  Many people do not have any symptoms during the early stages of CAD.  CAD can be treated with  lifestyle changes, medicines, surgery, or a combination of these treatments. This information is not intended to replace advice given to you by your health care provider. Make sure you discuss any questions you have with your health care provider. Document Revised: 01/15/2018 Document Reviewed: 01/05/2018 Elsevier Patient Education  2020 [employer].

## 2019-07-01 NOTE — Discharge Summary (Addendum)
Discharge Summary    Patient ID: Clinton Ortiz MRN: [id number]; DOB: March 26, 1941  Admit date: 06/30/2019 Discharge date: 07/01/2019  Primary Care Provider: Silverio Decamp, MD  Primary Cardiologist: Kirk Ruths, MD  Primary Electrophysiologist:  None   Discharge Diagnoses    Principal Problem:   Abnormal stress test Active Problems:   Hypertension   Hyperlipidemia   Morbid obesity ([place])   DOE (dyspnea on exertion)   CKD stage 3 due to type 2 diabetes mellitus (HCC)   Type 1 diabetes mellitus without complication (HCC)   CAD (coronary artery disease)   Diagnostic Studies/Procedures    LEFT HEART CATH AND CORONARY ANGIOGRAPHY 07/01/2019  Conclusion  Eccentric distal left main, 60 to 70%. Proximal 50 to 60% LAD stenosis. Widely patent ramus intermedius. 60 to 70% proximal circumflex. 40% ostial codominant RCA. Normal LVEDP.  Left ventriculography was not performed due to CKD.   RECOMMENDATIONS:   Additional hydration today for at least 4 hours and potentially overnight. Basic metabolic panel should be done at [facility] on Monday. TCTS has been contacted about surgical consultation.  This could be done as an outpatient as the patient is clinically stable. The patient's clinical team has been notified.  Recommendations  Antiplatelet/Anticoag Recommend Aspirin 81mg  daily for moderate CAD.   Diagnostic Dominance: Co-dominant   _____________   History of Present Illness     Clinton Ortiz is a 79 y.o. male with PMH of HTN, HLD, DM type 2, GERD, COPD, tongue cancer, CKD stage 4 and kidney cancer s/p nephrectomy. Mr. Schupp with referred to our practice for evaluation of an abnormal nuclear stress test per Dr. Aundria Mems.  He was seen by Dr. Stanford Breed on 06/22/2019.  Echocardiogram in January 2021 showed normal LV systolic function, grade 1 diastolic dysfunction and dilated aortic root measuring 45 mm.  Nuclear study January 2021 showed ejection fraction 68%; there  was ischemia in the mid anterior lateral wall/apex and inferoseptal wall.  Approximately 3 months ago the patient had an episode of chest pain while lying on his bed.  It was substernal with radiation to his left upper extremity and neck.  It was described as a dull pain.  There was associated dyspnea but no nausea or diaphoresis.  Pain was nonpleuritic.  Lasted for approximately 1 hour and resolved.  He also described dyspnea on exertion for approximately 1 year.  No orthopnea or PND but he does have mild pedal edema. No syncope.    Dr. Stanford Breed felt that the patient's chest pain was concerning but that he had had no recurrent episodes.  The patient is known to have risk factors including diabetes for 10-12 years, hypertension, hyperlipidemia and obesity.  Since his nuclear study showed ischemia in the distal anterior wall/apex arrangements were made for patient to have a cardiac catheterization.  He was brought in the day before his cath for IV hydration in the setting of his CKD and solitary kidney.   Hospital Course     Consultants: Cardiothoracic surgery, plan for outpatient evaluation  The patient underwent left heart catheterization today which was difficult from a right radial approach due to innominate artery/aortic angulation.  He was found to have eccentric 60-70% distal left main stenosis before the trifurcation into a ramus, LAD and circumflex.  There was also 60-70% ostial to proximal circumflex narrowing.  RCA is relatively small, has a downward origin from the aorta and contains ostial 40% narrowing.  No catheter damping.  LVEDP was normal.  Cardiothoracic surgery was  notified and arrangement has been made for outpatient evaluation on 07/05/2019 with Dr. Darcey Nora for possible CABG.  The patient has been started on aspirin 81 mg daily. He has SL NTG and we reviewed how to use it.  Right radial cath site is stable with no hematoma and the patient will be discharged home today.   The  patient has been hydrated with IV fluids today.  He will be encouraged to drink lots of fluids over the weekend.  We will continue to hold losartan.  He can take a Lasix if he develops swelling or dyspnea. We have switched statin to a high intensity statin, atorvastatin due to renal insufficiency. We will have the patient come to our [place] office on Monday for a basic metabolic panel.  Patient has been seen by Dr. Acie Fredrickson and Dr. Tamala Julian today and deemed ready for discharge home. All follow up appointments have been scheduled. Discharge medications are listed below.   Did the patient have an acute coronary syndrome (MI, NSTEMI, STEMI, etc) this admission?:  No                               Did the patient have a percutaneous coronary intervention (stent / angioplasty)?:  No.   _____________  Discharge Vitals Blood pressure 128/79, pulse 64, temperature (!) 97.4 F (36.3 C), temperature source Oral, resp. rate 12, height 5\' 7"  (1.702 m), weight 117.3 kg, SpO2 94 %.  Filed Weights   06/30/19 1500 07/01/19 0346  Weight: 115 kg 117.3 kg    Labs & Radiologic Studies    CBC Recent Labs    06/30/19 1537 07/01/19 0318  WBC 7.7 6.1  NEUTROABS 4.9  --   HGB 15.3 14.2  HCT 45.4 43.7  MCV 95.8 99.1  PLT 178 124*   Basic Metabolic Panel Recent Labs    06/30/19 1537 07/01/19 0318  NA 138 141  K 4.3 4.0  CL 105 104  CO2 25 25  GLUCOSE 215* 138*  BUN 37* 33*  CREATININE 2.67* 2.40*  CALCIUM 9.1 9.0   Liver Function Tests No results for input(s): AST, ALT, ALKPHOS, BILITOT, PROT, ALBUMIN in the last 72 hours. No results for input(s): LIPASE, AMYLASE in the last 72 hours. High Sensitivity Troponin:   No results for input(s): TROPONINIHS in the last 720 hours.  BNP Invalid input(s): POCBNP D-Dimer No results for input(s): DDIMER in the last 72 hours. Hemoglobin A1C No results for input(s): HGBA1C in the last 72 hours. Fasting Lipid Panel Recent Labs    07/01/19 0318  CHOL  146  HDL 24*  LDLCALC 68  TRIG 270*  CHOLHDL 6.1   Thyroid Function Tests No results for input(s): TSH, T4TOTAL, T3FREE, THYROIDAB in the last 72 hours.  Invalid input(s): FREET3 _____________  CT Chest Wo Contrast  Result Date: 06/23/2019 CLINICAL DATA:  Thoracic aortic aneurysm. EXAM: CT CHEST WITHOUT CONTRAST TECHNIQUE: Multidetector CT imaging of the chest was performed following the standard protocol without IV contrast. COMPARISON:  None. FINDINGS: Cardiovascular: The heart size is normal. No substantial pericardial effusion. Coronary artery calcification is evident. Atherosclerotic calcification is noted in the wall of the thoracic aorta. Ascending thoracic aorta measures 4 cm maximum diameter. Mediastinum/Nodes: No mediastinal lymphadenopathy. No evidence for gross hilar lymphadenopathy although assessment is limited by the lack of intravenous contrast on today's study. The esophagus has normal imaging features. There is no axillary lymphadenopathy. Lungs/Pleura: Tiny peripheral calcified granuloma  noted right middle lobe. Similar tiny calcified granuloma seen anterior left lung apex. No suspicious pulmonary nodule or mass. 3 mm noncalcified left upper lobe pulmonary nodule identified on 51/3. Upper Abdomen: The liver shows diffusely decreased attenuation suggesting fat deposition. 1.8 cm soft tissue nodule seen anterior to the liver on 68/2. Musculoskeletal: No worrisome lytic or sclerotic osseous abnormality. IMPRESSION: 1. 1.8 cm soft tissue nodule anterior to the liver, indeterminate. However, omental metastatic disease could have this appearance. Consider dedicated CT abdomen/pelvis with contrast to further evaluate. 2. Ascending thoracic aorta measures 4 cm diameter. Recommend annual imaging followup by CTA or MRA. This recommendation follows 2010 ACCF/AHA/AATS/ACR/ASA/SCA/SCAI/SIR/STS/SVM Guidelines for the Diagnosis and Management of Patients with Thoracic Aortic Disease. Circulation.  2010; 121: A076-A263. Aortic aneurysm NOS (ICD10-I71.9) 3. 3 mm noncalcified left upper lobe pulmonary nodule. No follow-up needed if patient is low-risk. Non-contrast chest CT can be considered in 12 months if patient is high-risk. This recommendation follows the consensus statement: Guidelines for Management of Incidental Pulmonary Nodules Detected on CT Images: From the Fleischner Society 2017; Radiology 2017; 284:228-243. 4. Aortic Atherosclerosis (ICD10-I70.0). Electronically Signed   By: Misty Stanley M.D.   On: 06/23/2019 15:43   CARDIAC CATHETERIZATION  Result Date: 07/01/2019  Eccentric distal left main, 60 to 70%.  Proximal 50 to 60% LAD stenosis.  Widely patent ramus intermedius.  60 to 70% proximal circumflex.  40% ostial codominant RCA.  Normal LVEDP.  Left ventriculography was not performed due to CKD. RECOMMENDATIONS:  Additional hydration today for at least 4 hours and potentially overnight.  Basic metabolic panel should be [facility] at [facility] on Monday.  TCTS has been contacted about surgical consultation.  This could be done as an outpatient as the patient is clinically stable.  The patient's clinical team has been notified.  DG Foot Complete Right  Result Date: 06/15/2019 CLINICAL DATA:  Status post fall. EXAM: RIGHT FOOT COMPLETE - 3+ VIEW COMPARISON:  None. FINDINGS: A small, mildly displaced fracture deformity is seen involving the base of the proximal phalanx of the fourth right toe. There is no evidence of dislocation. Mild degenerative changes are seen involving the metatarsophalangeal joint of the right great toe. Soft tissues are unremarkable. IMPRESSION: Acute fracture of the proximal phalanx of the fourth right toe. Electronically Signed   By: Virgina Norfolk M.D.   On: 06/15/2019 15:16   Disposition   Pt is being discharged home today in good condition.  Follow-up Plans & Appointments    Follow-up Information     Ivin Poot, MD Follow up.   Specialty:  Cardiothoracic Surgery Why: You have an evaluation with the cardiothoracic surgeon on 07/05/2019 at 4:00.  Please arrive 15 minutes early for check-in. Contact information: [address] [place] [address] [phone]         [facility] Follow up.   Specialty: Cardiology Why: Go to the office on Monday 07/04/19 for labs to be done to check kidney function.  Contact information: [address] [place] [phone]          Discharge Instructions     Diet - low sodium heart healthy   Complete by: As directed    Discharge instructions   Complete by: As directed    -Increase oral fluid intake over the weekend to flush kidneys. -Do not take losartan. -Take lasix if needed for swelling or shortness of breath. -Have labs checked in [place] on Monday.  PLEASE REMEMBER TO BRING ALL OF YOUR MEDICATIONS TO EACH OF YOUR FOLLOW-UP OFFICE VISITS.  PLEASE ATTEND ALL SCHEDULED FOLLOW-UP APPOINTMENTS.   Activity: Increase activity slowly as tolerated. You may shower, but no soaking baths (or swimming) for 1 week. No driving for 24 hours. No lifting over 5 lbs for 1 week.   You May Return to Work: in 1 week (if applicable)  Wound Care: You may wash cath site gently with soap and water. Keep cath site clean and dry. If you notice pain, swelling, bleeding or pus at your cath site, please call [phone].   Increase activity slowly   Complete by: As directed        Discharge Medications   Allergies as of 07/01/2019       Reactions   Flavoring Agent Other (See Comments)   makes him feel funny        Medication List     STOP taking these medications    furosemide 40 MG tablet Commonly known as: LASIX   losartan 100 MG tablet Commonly known as: COZAAR   pravastatin 40 MG tablet Commonly known as: PRAVACHOL       TAKE these medications    acetaminophen 325 MG tablet Commonly known  as: TYLENOL Take 650 mg by mouth every 6 (six) hours as needed for mild pain or headache.   Albuterol Sulfate 108 (90 Base) MCG/ACT Aepb Commonly known as: ProAir RespiClick Inhale 2 puffs into the lungs every 4 (four) hours as needed. What changed: reasons to take this   aspirin EC 81 MG tablet Take 1 tablet (81 mg total) by mouth daily. What changed:  medication strength how much to take   atorvastatin 80 MG tablet Commonly known as: LIPITOR Take 1 tablet (80 mg total) by mouth daily at 6 PM.   azelastine 0.1 % nasal spray Commonly known as: ASTELIN SPRAY 2 SPRAYS INTO EACH NOSTRIL TWICE A DAY AS DIRECTED What changed: See the new instructions.   B COMPLEX PO Take 1 tablet by mouth daily.   Dexcom G6 Receiver Devi 1 each by Does not apply route daily.   Dexcom G6 Sensor Misc 1 each by Does not apply route daily.   Dexcom G6 Transmitter Misc 1 each by Does not apply route daily.   gabapentin 300 MG capsule Commonly known as: NEURONTIN TAKE 1 CAPSULE BY MOUTH IN THE MORNING, 2 CAPSULES AT BEDTIME What changed:  how much to take how to take this when to take this additional instructions   glipiZIDE 10 MG tablet Commonly known as: GLUCOTROL Take 1 tablet (10 mg total) by mouth 2 (two) times daily before a meal.   metoprolol succinate 25 MG 24 hr tablet Commonly known as: Toprol XL Take 1 tablet (25 mg total) by mouth daily. Take with or immediately following a meal. What changed: additional instructions   MULTIVITAMIN ADULTS 50+ PO Take 1 tablet by mouth daily. Chew   nitroGLYCERIN 0.4 MG SL tablet Commonly known as: Nitrostat Place 1 tablet (0.4 mg total) under the tongue every 5 (five) minutes as needed for chest pain (or tightness).   ondansetron 8 MG disintegrating tablet Commonly known as: ZOFRAN-ODT Take 1 tablet (8 mg total) by mouth every 8 (eight) hours as needed for nausea.   pantoprazole 40 MG tablet Commonly known as: PROTONIX Take 1 tablet  (40 mg total) by mouth daily.   tamsulosin 0.4 MG Caps capsule Commonly known as: FLOMAX TAKE 2 CAPSULES BY MOUTH EVERY DAY What  changed: when to take this   Trelegy Ellipta 100-62.5-25 MCG/INH Aepb Generic drug: Fluticasone-Umeclidin-Vilant INHALE 1 PUFF BY MOUTH EVERY DAY What changed: See the new instructions.   triamcinolone 55 MCG/ACT Aero nasal inhaler Commonly known as: NASACORT Place 2 sprays into the nose daily.   Trulicity 4.5 BD/5.3GD Sopn Generic drug: Dulaglutide Inject 1 pen into the skin once a week. What changed: how much to take   Vitamin D3 10 MCG (400 UNIT) Chew Chew by mouth.           Outstanding Labs/Studies   BMet on Monday. Pt will go to our [place] location for labs via [place].   Duration of Discharge Encounter   Greater than 30 minutes including physician time.  Signed, Daune Perch, NP 07/01/2019, 4:48 PM  Attending Note:   The patient was seen and examined.  Agree with assessment and plan as noted above.  Changes made to the above note as needed.  Patient seen and independently examined with Pecolia Ades NP .   We discussed all aspects of the encounter. I agree with the assessment and plan as stated above.    CAD :  pt has LM and 3 V cad and has been referred for surgery.   He is currenlty pain free. He received IV hydration following cath.  Will bet a bmp next Monday to check renal function Will see TCTS on Tuesday   Stable for dc    I have spent a total of 40 minutes with patient reviewing hospital  notes , telemetry, EKGs, labs and examining patient as well as establishing an assessment and plan that was discussed with the patient.  > 50% of time was spent in direct patient care.    Thayer Headings, Brooke Bonito., MD, [facility] 07/05/2019, 9:02 PM [address]  [place] Pager [phone]

## 2019-07-04 ENCOUNTER — Encounter: Payer: Medicare Other | Admitting: Cardiothoracic Surgery

## 2019-07-04 DIAGNOSIS — N183 Chronic kidney disease, stage 3 unspecified: Secondary | ICD-10-CM | POA: Diagnosis not present

## 2019-07-04 DIAGNOSIS — E1122 Type 2 diabetes mellitus with diabetic chronic kidney disease: Secondary | ICD-10-CM | POA: Diagnosis not present

## 2019-07-05 ENCOUNTER — Telehealth: Payer: Self-pay | Admitting: Cardiology

## 2019-07-05 ENCOUNTER — Encounter: Payer: Medicare Other | Admitting: Cardiothoracic Surgery

## 2019-07-05 NOTE — Telephone Encounter (Signed)
Left message for patient to call and schedule CT abd without contrast ordered by Dr. Stanford Breed

## 2019-07-07 ENCOUNTER — Other Ambulatory Visit: Payer: Self-pay

## 2019-07-07 ENCOUNTER — Encounter: Payer: Self-pay | Admitting: Cardiothoracic Surgery

## 2019-07-07 ENCOUNTER — Encounter: Payer: Medicare Other | Admitting: Cardiothoracic Surgery

## 2019-07-07 ENCOUNTER — Other Ambulatory Visit: Payer: Self-pay | Admitting: *Deleted

## 2019-07-07 ENCOUNTER — Institutional Professional Consult (permissible substitution) (INDEPENDENT_AMBULATORY_CARE_PROVIDER_SITE_OTHER): Payer: Medicare Other | Admitting: Cardiothoracic Surgery

## 2019-07-07 ENCOUNTER — Telehealth: Payer: Self-pay | Admitting: Cardiology

## 2019-07-07 VITALS — BP 138/90 | HR 82 | Temp 97.6°F | Resp 20 | Ht 67.0 in | Wt 255.0 lb

## 2019-07-07 DIAGNOSIS — I7121 Aneurysm of the ascending aorta, without rupture: Secondary | ICD-10-CM

## 2019-07-07 DIAGNOSIS — I2510 Atherosclerotic heart disease of native coronary artery without angina pectoris: Secondary | ICD-10-CM

## 2019-07-07 DIAGNOSIS — I712 Thoracic aortic aneurysm, without rupture: Secondary | ICD-10-CM

## 2019-07-07 NOTE — Telephone Encounter (Signed)
Patient aware of appointment Monday 07/11/19 at 2:00pm for CT abdomen and pelvis at [facility] time 1:45pm for check in---NPO 4 hours prior to exam

## 2019-07-07 NOTE — Pre-Procedure Instructions (Signed)
Clinton Ortiz  07/07/2019    Your procedure is scheduled on Tuesday, July 12, 2019 at 7:30 AM.   Report to [facility] Entrance "A" Admitting Office at 5:30 AM.   Call this number if you have problems the morning of surgery: [phone]   Questions prior to day of surgery, please call [phone] between 8 & 4 PM.   Remember:  Do not eat or drink after midnight Monday, 07/11/19.  Take these medicines the morning of surgery with A SIP OF WATER: Gabapentin (Neurontin), Metoprolol (Toprol XL), Pantoprazole (Protonix), Acetaminophen (Tylenol) - if needed, Nitroglycerin - if needed, Astelin nasal spray, Trelegy Ellipta inhaler, Triamcinolone (Nasacort) inhaler, Albuterol (Proventil) inhaler - if needed (bring this inhaler with you day of surgery)  Stop Multivitamins as of today. You may continue to take your Aspirin, but do not take it the day of surgery. Do not use NSAIDS (Ibuprofen, Aleve, etc), other Aspirin products (BC Powders, Goody's, etc), Herbal medications or Fish oil prior to surgery.   Do NOT take Glipizide (Glucotrol) Monday evening and Tuesday morning. Do NOT take Dulaglutide (Trulicity) Tuesday morning.   How to Manage Your Diabetes Before Surgery   Why is it important to control my blood sugar before and after surgery?   Improving blood sugar levels before and after surgery helps healing and can limit problems.  A way of improving blood sugar control is eating a healthy diet by:  - Eating less sugar and carbohydrates  - Increasing activity/exercise  - Talk with your doctor about reaching your blood sugar goals  High blood sugars (greater than 180 mg/dL) can raise your risk of infections and slow down your recovery so you will need to focus on controlling your diabetes during the weeks before surgery.  Make sure that the doctor who takes care of your diabetes knows about your planned surgery including the date and location.  How do I manage my blood sugars before  surgery?   Check your blood sugar at least 4 times a day, 2 days before surgery to make sure that they are not too high or low.  Check your blood sugar the morning of your surgery when you wake up and every 2 hours until you get to the Short-Stay unit.  Treat a low blood sugar (less than 70 mg/dL) with 1/2 cup of clear juice (cranberry or apple), 4 glucose tablets, OR glucose gel.  Recheck blood sugar in 15 minutes after treatment (to make sure it is greater than 70 mg/dL).  If blood sugar is not greater than 70 mg/dL on re-check, call [phone] for further instructions.   Report your blood sugar to the Short-Stay nurse when you get to Short-Stay.  References:  [facility], 2007 "How to Manage your Diabetes Before and After Surgery".   Do not wear jewelry, make-up or nail polish.  Do not wear lotions, powders, or perfumes, or deodorant.  Do not shave 48 hours prior to surgery.  Men may shave face and neck.  Do not bring valuables to the hospital.  [facility] is not responsible for any belongings or valuables.  Contacts, dentures or bridgework may not be worn into surgery.  Leave your suitcase in the car.  After surgery it may be brought to your room.  For patients admitted to the hospital, discharge time will be determined by your treatment team.  [facility] - Preparing for Surgery  Before surgery, you can play an important role.  Because skin is  not sterile, your skin needs to be as free of germs as possible.  You can reduce the number of germs on you skin by washing with CHG (chlorahexidine gluconate) soap before surgery.  CHG is an antiseptic cleaner which kills germs and bonds with the skin to continue killing germs even after washing.  Oral Hygiene is also important in reducing the risk of infection.  Remember to brush your teeth with your regular toothpaste the morning of surgery.  Please DO NOT use if you have an allergy to CHG or antibacterial  soaps.  If your skin becomes reddened/irritated stop using the CHG and inform your nurse when you arrive at Short Stay.  Do not shave (including legs and underarms) for at least 48 hours prior to the first CHG shower.  You may shave your face.  Please follow these instructions carefully:   1.  Shower with CHG Soap the night before surgery and the morning of Surgery.  2.  If you choose to wash your hair, wash your hair first as usual with your normal shampoo.  3.  After you shampoo, rinse your hair and body thoroughly to remove the shampoo. 4.  Use CHG as you would any other liquid soap.  You can apply chg directly to the skin and wash gently with a      scrungie or washcloth.           5.  Apply the CHG Soap to your body ONLY FROM THE NECK DOWN.   Do not use on open wounds or open sores. Avoid contact with your eyes, ears, mouth and genitals (private parts).  Wash genitals (private parts) with your normal soap - do this prior to using CHG soap.  6.  Wash thoroughly, paying special attention to the area where your surgery will be performed.  7.  Thoroughly rinse your body with warm water from the neck down.  8.  DO NOT shower/wash with your normal soap after using and rinsing off the CHG Soap.  9.  Pat yourself dry with a clean towel.            10.  Wear clean pajamas.            11.  Place clean sheets on your bed the night of your first shower and do not sleep with pets.  Day of Surgery  Shower as above. Do not apply any lotions/deodorants the morning of surgery.   Please wear clean clothes to the hospital. Remember to brush your teeth with toothpaste.  Please read over the fact sheets that you were given.

## 2019-07-07 NOTE — Progress Notes (Signed)
PCP is Silverio Decamp, MD Referring Provider is Belva Crome, MD  Chief Complaint  Patient presents with  . 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  His LV systolic function is preserved.  LVEDP is normal.  Echocardiogram shows LVH from hypertension and no significant valvular disease.  Patient is in a sinus rhythm.  The patient's medical history is significant for history of left nephrectomy for renal cell cancer 2013 without recurrence but with baseline creatinine of 2.4.  He has COPD on inhalers at home but no sign.  Ificant smoking history.  He is obese with a BMI > 35.  I had the patient walk 200 feet in the office and his oxygen saturation on room air at rest was 96% and 95% after exercise. Past Medical History:  Diagnosis Date  . CAD (coronary artery disease)    07/01/2019: LHC with LM disease. Needs CABG.  . Cancer ([place])    left kidney  . COPD (chronic obstructive pulmonary disease) ([place])   . Diabetes mellitus ([place])   . Frequent urination   . GERD (gastroesophageal reflux disease)   . Hernia   . Hyperlipidemia   . Hypertension   . Renal insufficiency   . Spondylolysis   . Tongue cancer [facility])     Past Surgical History:  Procedure Laterality Date  . HERNIA REPAIR    . LEFT HEART CATH AND CORONARY ANGIOGRAPHY N/A 07/01/2019   Procedure: LEFT HEART CATH AND CORONARY ANGIOGRAPHY;  Surgeon: Belva Crome, MD;  Location: [facility];  Service: Cardiovascular;  Laterality: N/A;  . NEPHRECTOMY     left    Family History   Problem Relation Age of Onset  . Cancer Mother        bladder  . Heart attack Father     Social History Social History   Tobacco Use  . Smoking status: Former Smoker    Packs/day: 1.00    Years: 2.00    Pack years: 2.00    Types: Cigarettes  . Smokeless tobacco: Never Used  Substance Use Topics  . Alcohol use: No  . Drug use: No    Current Outpatient Medications  Medication Sig Dispense Refill  . acetaminophen (TYLENOL) 325 MG tablet Take 650 mg by mouth every 6 (six) hours as needed for mild pain or headache.     . Albuterol Sulfate (PROAIR RESPICLICK) 016 (90 BASE) MCG/ACT AEPB Inhale 2 puffs into the lungs every 4 (four) hours as needed. (Patient taking differently: Inhale 2 puffs into the lungs every 4 (four) hours as needed (shortness of breath). ) 1 each 0  . aspirin 325 MG tablet Take 325 mg by mouth daily.    Marland Kitchen atorvastatin (LIPITOR) 80 MG tablet Take 1 tablet (80 mg total) by mouth daily at 6 PM. 90 tablet 3  . azelastine (ASTELIN) 0.1 % nasal spray SPRAY 2 SPRAYS INTO EACH NOSTRIL TWICE A DAY AS DIRECTED (Patient taking differently: Place 2 sprays into both nostrils daily. ) 30 mL 1  . B Complex Vitamins (B COMPLEX PO) Take 1 tablet by mouth daily.     . Cholecalciferol (VITAMIN  D3) 10 MCG (400 UNIT) CHEW Chew by mouth.    . Continuous Blood Gluc Receiver ([place]) DEVI 1 each by Does not apply route daily. 1 each 0  . Continuous Blood Gluc Sensor (DEXCOM G6 SENSOR) MISC 1 each by Does not apply route daily. 3 each 0  . Continuous Blood Gluc Transmit (DEXCOM G6 TRANSMITTER) MISC 1 each by Does not apply route daily. 1 each 0  . Dulaglutide (TRULICITY) 4.5 IZ/1.2WP SOPN Inject 1 pen into the skin once a week. (Patient taking differently: Inject 4.5 mg into the skin once a week. ) 4 pen 11  . gabapentin (NEURONTIN) 300 MG capsule TAKE 1 CAPSULE BY MOUTH IN THE MORNING, 2 CAPSULES AT BEDTIME (Patient taking differently: Take 300-600 mg by mouth See admin  instructions. TAKE 300 mg capsule by mouth in the morning, then take 300mg  capsule at bedtime, may take an additional 300mg  capsule at at bedtime if nerve pain continues.) 90 capsule 3  . glipiZIDE (GLUCOTROL) 10 MG tablet Take 1 tablet (10 mg total) by mouth 2 (two) times daily before a meal. 60 tablet 3  . metoprolol succinate (TOPROL XL) 25 MG 24 hr tablet Take 1 tablet (25 mg total) by mouth daily. Take with or immediately following a meal. (Patient taking differently: Take 25 mg by mouth daily. ) 30 tablet 3  . Multiple Vitamins-Minerals (MULTIVITAMIN ADULTS 50+ PO) Take 1 tablet by mouth daily. Chew    . nitroGLYCERIN (NITROSTAT) 0.4 MG SL tablet Place 1 tablet (0.4 mg total) under the tongue every 5 (five) minutes as needed for chest pain (or tightness). 30 tablet 0  . ondansetron (ZOFRAN-ODT) 8 MG disintegrating tablet Take 1 tablet (8 mg total) by mouth every 8 (eight) hours as needed for nausea. 20 tablet 11  . pantoprazole (PROTONIX) 40 MG tablet Take 1 tablet (40 mg total) by mouth daily. 90 tablet 3  . tamsulosin (FLOMAX) 0.4 MG CAPS capsule TAKE 2 CAPSULES BY MOUTH EVERY DAY (Patient taking differently: Take 0.8 mg by mouth at bedtime. ) 180 capsule 1  . TRELEGY ELLIPTA 100-62.5-25 MCG/INH AEPB INHALE 1 PUFF BY MOUTH EVERY DAY (Patient taking differently: Inhale 1 puff into the lungs daily. ) 60 each 11  . triamcinolone (NASACORT) 55 MCG/ACT AERO nasal inhaler Place 2 sprays into the nose daily. 1 Inhaler 11   No current facility-administered medications for this visit.    Allergies  Allergen Reactions  . Flavoring Agent Other (See Comments)    makes him feel funny    Review of Systems   The patient takes his Trulicity injection for diabetes once a week on Tuesdays No history of stroke No active dental complaints or difficulty swallowing No problems with anesthesia when he had his nephrectomy No history of bleeding diathesis or previous blood transfusion He is right-hand  dominant No previous strokes seizures or syncope No GI symptoms with normal colonoscopy within the past 5 years Denies alcohol intake, no history of jaundice or ulcer disease Positive history for kidney stones and pyelonephritis treated with with IV antibiotics 2018  BP 138/90   Pulse 82   Temp 97.6 F (36.4 C) (Skin)   Resp 20   Ht 5\' 7"  (1.702 m)   Wt 255 lb (115.7 kg)   SpO2 94% Comment: RA  BMI 39.94 kg/m  Physical Exam     Physical Exam  General: Obese 79 year old male accompanied by daughter no acute distress HEENT: Normocephalic pupils equal , dentition adequate Neck: Supple  without JVD, adenopathy, or bruit Chest: Clear to auscultation, symmetrical breath sounds, no rhonchi, no tenderness             or deformity Cardiovascular: Regular rate and rhythm, no murmur, no gallop, peripheral pulses             palpable in all extremities Abdomen:  Soft, nontender, no palpable mass or organomegaly Extremities: Warm, well-perfused, no clubbing cyanosis edema or tenderness,              no venous stasis changes of the legs.  Poor left ulnar pulse not palpable Rectal/GU: Deferred Neuro: Grossly non--focal and symmetrical throughout Skin: Clean and dry without rash or ulceration   Diagnostic Tests: 80% left main stenosis with 90% ostial circumflex stenosis and 45% ostial RCA stenosis  Normal LV systolic function on echocardiogram EF 60%  Impression: Symptomatic severe multivessel coronary disease with preserved LV systolic function.  Class IV angina Type 2 diabetes mellitus Obesity Chronic renal failure from previous left nephrectomy Baseline creatinine 2.4   Plan: Patient would benefit from multivessel CABG but at increased risk due to his comorbid medical problems as noted above.  We will check his creatinine 6 days now after cardiac catheterization to make sure his renal function has recovered.  Surgery will be tentatively scheduled for Tuesday, March 2 at [facility]  bypass grafts planned to the LAD ramus OM and possibly RCA.  Patient understands the benefits and potential risks of surgery and agrees to proceed.   Len Childs, MD [facility] [phone]

## 2019-07-07 NOTE — Telephone Encounter (Signed)
Representative from Dr. Landry Corporal office called. The patient was in Dr. Mcneil Sober office today and stated he had a repeat BMET drawn at [facility] in [place].   Dr. Mcneil Sober office would like our office to fax a copy of those results to their office when they come in. Their fax number is [phone]

## 2019-07-08 ENCOUNTER — Encounter (HOSPITAL_COMMUNITY): Payer: Self-pay

## 2019-07-08 ENCOUNTER — Other Ambulatory Visit: Payer: Self-pay

## 2019-07-08 ENCOUNTER — Encounter (HOSPITAL_COMMUNITY)
Admission: RE | Admit: 2019-07-08 | Discharge: 2019-07-08 | Disposition: A | Discharge status: Home / Self Care | Payer: Medicare Other | Source: Ambulatory Visit | Attending: Cardiothoracic Surgery | Admitting: Cardiothoracic Surgery

## 2019-07-08 ENCOUNTER — Ambulatory Visit (HOSPITAL_COMMUNITY)
Admission: RE | Admit: 2019-07-08 | Discharge: 2019-07-08 | Disposition: A | Discharge status: Home / Self Care | Payer: Medicare Other | Source: Ambulatory Visit | Attending: Cardiothoracic Surgery | Admitting: Cardiothoracic Surgery

## 2019-07-08 ENCOUNTER — Other Ambulatory Visit (HOSPITAL_COMMUNITY)
Admission: RE | Admit: 2019-07-08 | Discharge: 2019-07-08 | Disposition: A | Discharge status: Home / Self Care | Payer: Medicare Other | Source: Ambulatory Visit | Attending: Cardiothoracic Surgery | Admitting: Cardiothoracic Surgery

## 2019-07-08 DIAGNOSIS — E785 Hyperlipidemia, unspecified: Secondary | ICD-10-CM | POA: Diagnosis not present

## 2019-07-08 DIAGNOSIS — Z951 Presence of aortocoronary bypass graft: Secondary | ICD-10-CM | POA: Insufficient documentation

## 2019-07-08 DIAGNOSIS — Z01818 Encounter for other preprocedural examination: Secondary | ICD-10-CM | POA: Insufficient documentation

## 2019-07-08 DIAGNOSIS — Z7901 Long term (current) use of anticoagulants: Secondary | ICD-10-CM | POA: Insufficient documentation

## 2019-07-08 DIAGNOSIS — Z20822 Contact with and (suspected) exposure to covid-19: Secondary | ICD-10-CM | POA: Diagnosis not present

## 2019-07-08 DIAGNOSIS — E1122 Type 2 diabetes mellitus with diabetic chronic kidney disease: Secondary | ICD-10-CM | POA: Insufficient documentation

## 2019-07-08 DIAGNOSIS — I2510 Atherosclerotic heart disease of native coronary artery without angina pectoris: Secondary | ICD-10-CM | POA: Diagnosis not present

## 2019-07-08 DIAGNOSIS — I129 Hypertensive chronic kidney disease with stage 1 through stage 4 chronic kidney disease, or unspecified chronic kidney disease: Secondary | ICD-10-CM | POA: Diagnosis not present

## 2019-07-08 DIAGNOSIS — Z79899 Other long term (current) drug therapy: Secondary | ICD-10-CM | POA: Diagnosis not present

## 2019-07-08 DIAGNOSIS — N183 Chronic kidney disease, stage 3 unspecified: Secondary | ICD-10-CM | POA: Insufficient documentation

## 2019-07-08 DIAGNOSIS — K219 Gastro-esophageal reflux disease without esophagitis: Secondary | ICD-10-CM | POA: Diagnosis not present

## 2019-07-08 DIAGNOSIS — Z87891 Personal history of nicotine dependence: Secondary | ICD-10-CM | POA: Insufficient documentation

## 2019-07-08 DIAGNOSIS — J449 Chronic obstructive pulmonary disease, unspecified: Secondary | ICD-10-CM | POA: Insufficient documentation

## 2019-07-08 DIAGNOSIS — Z01812 Encounter for preprocedural laboratory examination: Secondary | ICD-10-CM | POA: Diagnosis not present

## 2019-07-08 DIAGNOSIS — Z794 Long term (current) use of insulin: Secondary | ICD-10-CM | POA: Insufficient documentation

## 2019-07-08 DIAGNOSIS — Z7982 Long term (current) use of aspirin: Secondary | ICD-10-CM | POA: Insufficient documentation

## 2019-07-08 DIAGNOSIS — Z01810 Encounter for preprocedural cardiovascular examination: Secondary | ICD-10-CM | POA: Insufficient documentation

## 2019-07-08 DIAGNOSIS — Z87820 Personal history of traumatic brain injury: Secondary | ICD-10-CM | POA: Insufficient documentation

## 2019-07-08 HISTORY — DX: Dyspnea, unspecified: R06.00

## 2019-07-08 HISTORY — DX: Unspecified osteoarthritis, unspecified site: M19.90

## 2019-07-08 HISTORY — DX: Headache, unspecified: R51.9

## 2019-07-08 HISTORY — DX: Personal history of urinary calculi: Z87.442

## 2019-07-08 HISTORY — DX: Other complications of anesthesia, initial encounter: T88.59XA

## 2019-07-08 HISTORY — DX: Peripheral vascular disease, unspecified: I73.9

## 2019-07-08 HISTORY — DX: Other constipation: K59.09

## 2019-07-08 HISTORY — DX: Family history of other specified conditions: Z84.89

## 2019-07-08 HISTORY — DX: Pneumonia, unspecified organism: J18.9

## 2019-07-08 LAB — URINALYSIS, ROUTINE W REFLEX MICROSCOPIC
Bilirubin Urine: NEGATIVE
Glucose, UA: NEGATIVE mg/dL
Hgb urine dipstick: NEGATIVE
Ketones, ur: NEGATIVE mg/dL
Leukocytes,Ua: NEGATIVE
Nitrite: NEGATIVE
Protein, ur: NEGATIVE mg/dL
Specific Gravity, Urine: 1.017 (ref 1.005–1.030)
pH: 7 (ref 5.0–8.0)

## 2019-07-08 LAB — COMPREHENSIVE METABOLIC PANEL
ALT: 40 U/L (ref 0–44)
AST: 32 U/L (ref 15–41)
Albumin: 3.9 g/dL (ref 3.5–5.0)
Alkaline Phosphatase: 96 U/L (ref 38–126)
Anion gap: 11 (ref 5–15)
BUN: 32 mg/dL — ABNORMAL HIGH (ref 8–23)
CO2: 23 mmol/L (ref 22–32)
Calcium: 9 mg/dL (ref 8.9–10.3)
Chloride: 107 mmol/L (ref 98–111)
Creatinine, Ser: 2.5 mg/dL — ABNORMAL HIGH (ref 0.61–1.24)
GFR calc Af Amer: 28 mL/min — ABNORMAL LOW (ref >60)
GFR calc non Af Amer: 24 mL/min — ABNORMAL LOW (ref >60)
Glucose, Bld: 131 mg/dL — ABNORMAL HIGH (ref 70–99)
Potassium: 4.3 mmol/L (ref 3.5–5.1)
Sodium: 141 mmol/L (ref 135–145)
Total Bilirubin: 0.7 mg/dL (ref 0.3–1.2)
Total Protein: 6.8 g/dL (ref 6.5–8.1)

## 2019-07-08 LAB — PROTIME-INR
INR: 1 (ref 0.8–1.2)
Prothrombin Time: 13 seconds (ref 11.4–15.2)

## 2019-07-08 LAB — BLOOD GAS, ARTERIAL
Acid-Base Excess: 2.2 mmol/L — ABNORMAL HIGH (ref 0.0–2.0)
Bicarbonate: 26.2 mmol/L (ref 20.0–28.0)
Drawn by: 421801
FIO2: 21
O2 Saturation: 96.8 %
Patient temperature: 37
pCO2 arterial: 40.4 mmHg (ref 32.0–48.0)
pH, Arterial: 7.428 (ref 7.350–7.450)
pO2, Arterial: 90 mmHg (ref 83.0–108.0)

## 2019-07-08 LAB — CBC
HCT: 47.2 % (ref 39.0–52.0)
Hemoglobin: 15.5 g/dL (ref 13.0–17.0)
MCH: 32.7 pg (ref 26.0–34.0)
MCHC: 32.8 g/dL (ref 30.0–36.0)
MCV: 99.6 fL (ref 80.0–100.0)
Platelets: 171 10*3/uL (ref 150–400)
RBC: 4.74 MIL/uL (ref 4.22–5.81)
RDW: 12.6 % (ref 11.5–15.5)
WBC: 7.5 10*3/uL (ref 4.0–10.5)
nRBC: 0 % (ref 0.0–0.2)

## 2019-07-08 LAB — HEMOGLOBIN A1C
Hgb A1c MFr Bld: 9.7 % — ABNORMAL HIGH (ref 4.8–5.6)
Mean Plasma Glucose: 231.69 mg/dL

## 2019-07-08 LAB — SARS CORONAVIRUS 2 (TAT 6-24 HRS): SARS Coronavirus 2: NEGATIVE

## 2019-07-08 LAB — SURGICAL PCR SCREEN
MRSA, PCR: NEGATIVE
Staphylococcus aureus: POSITIVE — AB

## 2019-07-08 LAB — APTT: aPTT: 23 seconds — ABNORMAL LOW (ref 24–36)

## 2019-07-08 LAB — ABO/RH: ABO/RH(D): A POS

## 2019-07-08 LAB — GLUCOSE, CAPILLARY: Glucose-Capillary: 168 mg/dL — ABNORMAL HIGH (ref 70–99)

## 2019-07-08 NOTE — Progress Notes (Signed)
PCP - Dr. Dianah Field Cardiologist - Dr. Stanford Breed  Chest x-ray - today EKG - 07/01/19 Stress Test - 05/30/19 ECHO - 05/31/19 Cardiac Cath - 07/01/19  Sleep Study - None - +Stopbang today  Fasting Blood Sugar - 130-150 Checks Blood Sugar __3___ times a day CBG today - 168 (ate lunch just prior) Last A1C was 9.7 in January, 2021  Aspirin Instructions: continue to take Aspirin, not to take day of surgery  COVID TEST- 07/08/19 Pt had 1st Covid vaccine in January, cannot get an appt to get the 2nd vaccine.  Pt is extremely hard of hearing even with his hearing aids. I had his daughter, Ivin Booty come in during the PAT appt to help answer questions and hear the instructions.   Anesthesia review: yes, heart hx.  Patient denies shortness of breath, fever, cough and chest pain at PAT appointment   All instructions explained to the patient, with a verbal understanding of the material. Patient agrees to go over the instructions while at home for a better understanding. Patient also instructed to self quarantine after being tested for COVID-19. The opportunity to ask questions was provided.

## 2019-07-08 NOTE — Progress Notes (Signed)
Mupirocin Ointment Rx called into [facility] [address] [place] for positive PCR of Staph. Pt's daughter was notified of results and that the Rx has been called in for pt.

## 2019-07-08 NOTE — Progress Notes (Signed)
   07/08/19 1314  OBSTRUCTIVE SLEEP APNEA  Have you ever been diagnosed with sleep apnea through a sleep study? No  Do you snore loudly (loud enough to be heard through closed doors)?  1  Do you often feel tired, fatigued, or sleepy during the daytime (such as falling asleep during driving or talking to someone)? 1  Has anyone observed you stop breathing during your sleep? 0  Do you have, or are you being treated for high blood pressure? 1  BMI more than 35 kg/m2? 1  Age > 50 (1-yes) 1  Neck circumference greater than:Male 16 inches or larger, Male 17inches or larger? 1  Male Gender (Yes=1) 1  Obstructive Sleep Apnea Score 7  Score 5 or greater  Results sent to PCP

## 2019-07-11 ENCOUNTER — Ambulatory Visit (INDEPENDENT_AMBULATORY_CARE_PROVIDER_SITE_OTHER): Payer: Medicare Other

## 2019-07-11 ENCOUNTER — Other Ambulatory Visit: Payer: Self-pay

## 2019-07-11 ENCOUNTER — Encounter: Payer: Self-pay | Admitting: Cardiothoracic Surgery

## 2019-07-11 ENCOUNTER — Encounter (HOSPITAL_COMMUNITY): Payer: Self-pay

## 2019-07-11 ENCOUNTER — Ambulatory Visit (HOSPITAL_COMMUNITY)
Admission: RE | Admit: 2019-07-11 | Discharge: 2019-07-11 | Disposition: A | Discharge status: Home / Self Care | Payer: Medicare Other | Source: Ambulatory Visit | Attending: Cardiothoracic Surgery | Admitting: Cardiothoracic Surgery

## 2019-07-11 DIAGNOSIS — K769 Liver disease, unspecified: Secondary | ICD-10-CM | POA: Diagnosis not present

## 2019-07-11 DIAGNOSIS — I2510 Atherosclerotic heart disease of native coronary artery without angina pectoris: Secondary | ICD-10-CM | POA: Insufficient documentation

## 2019-07-11 DIAGNOSIS — N200 Calculus of kidney: Secondary | ICD-10-CM | POA: Diagnosis not present

## 2019-07-11 LAB — PULMONARY FUNCTION TEST
DL/VA % pred: 92 %
DL/VA: 3.67 ml/min/mmHg/L
DLCO cor % pred: 86 %
DLCO cor: 19.51 ml/min/mmHg
DLCO unc % pred: 88 %
DLCO unc: 19.99 ml/min/mmHg
FEF 25-75 Post: 1.81 L/sec
FEF 25-75 Pre: 1.72 L/sec
FEF2575-%Change-Post: 4 %
FEF2575-%Pred-Post: 97 %
FEF2575-%Pred-Pre: 93 %
FEV1-%Change-Post: 0 %
FEV1-%Pred-Post: 96 %
FEV1-%Pred-Pre: 96 %
FEV1-Post: 2.53 L
FEV1-Pre: 2.52 L
FEV1FVC-%Change-Post: 8 %
FEV1FVC-%Pred-Pre: 102 %
FEV6-%Change-Post: -6 %
FEV6-%Pred-Post: 90 %
FEV6-%Pred-Pre: 97 %
FEV6-Post: 3.08 L
FEV6-Pre: 3.31 L
FEV6FVC-%Change-Post: 0 %
FEV6FVC-%Pred-Post: 104 %
FEV6FVC-%Pred-Pre: 104 %
FVC-%Change-Post: -7 %
FVC-%Pred-Post: 86 %
FVC-%Pred-Pre: 93 %
FVC-Post: 3.15 L
FVC-Pre: 3.41 L
Post FEV1/FVC ratio: 80 %
Post FEV6/FVC ratio: 98 %
Pre FEV1/FVC ratio: 74 %
Pre FEV6/FVC Ratio: 97 %
RV % pred: 86 %
RV: 2.1 L
TLC % pred: 88 %
TLC: 5.72 L

## 2019-07-11 MED ORDER — TRANEXAMIC ACID 1000 MG/10ML IV SOLN
1.5000 mg/kg/h | INTRAVENOUS | Status: AC
  Administered 2019-07-12 (×2): 1.5 mg/kg/h via INTRAVENOUS
  Filled 2019-07-11: qty 25

## 2019-07-11 MED ORDER — POTASSIUM CHLORIDE 2 MEQ/ML IV SOLN
80.0000 meq | INTRAVENOUS | Status: DC
  Filled 2019-07-11: qty 40

## 2019-07-11 MED ORDER — VANCOMYCIN HCL 1500 MG/300ML IV SOLN
1500.0000 mg | INTRAVENOUS | Status: AC
  Administered 2019-07-12: 1500 mg via INTRAVENOUS
  Filled 2019-07-11: qty 300

## 2019-07-11 MED ORDER — SODIUM CHLORIDE 0.9 % IV SOLN
INTRAVENOUS | Status: DC
  Filled 2019-07-11: qty 30

## 2019-07-11 MED ORDER — TRANEXAMIC ACID (OHS) PUMP PRIME SOLUTION
2.0000 mg/kg | INTRAVENOUS | Status: DC
  Filled 2019-07-11: qty 2.34

## 2019-07-11 MED ORDER — SODIUM CHLORIDE 0.9 % IV SOLN
1.5000 g | INTRAVENOUS | Status: AC
  Administered 2019-07-12: 1.5 g via INTRAVENOUS
  Filled 2019-07-11: qty 1.5

## 2019-07-11 MED ORDER — NOREPINEPHRINE 4 MG/250ML-% IV SOLN
0.0000 ug/min | INTRAVENOUS | Status: AC
  Administered 2019-07-12: 2 ug/min via INTRAVENOUS
  Filled 2019-07-11: qty 250

## 2019-07-11 MED ORDER — ALBUTEROL SULFATE (2.5 MG/3ML) 0.083% IN NEBU
2.5000 mg | INHALATION_SOLUTION | Freq: Once | RESPIRATORY_TRACT | Status: AC
  Administered 2019-07-11: 12:00:00 2.5 mg via RESPIRATORY_TRACT

## 2019-07-11 MED ORDER — DEXMEDETOMIDINE HCL IN NACL 400 MCG/100ML IV SOLN
0.1000 ug/kg/h | INTRAVENOUS | Status: AC
  Administered 2019-07-12: .7 ug/kg/h via INTRAVENOUS
  Administered 2019-07-12: 09:00:00 .2 ug/kg/h via INTRAVENOUS
  Filled 2019-07-11: qty 100

## 2019-07-11 MED ORDER — NITROGLYCERIN IN D5W 200-5 MCG/ML-% IV SOLN
2.0000 ug/min | INTRAVENOUS | Status: DC
  Filled 2019-07-11: qty 250

## 2019-07-11 MED ORDER — PLASMA-LYTE 148 IV SOLN
INTRAVENOUS | Status: DC
  Filled 2019-07-11: qty 2.5

## 2019-07-11 MED ORDER — INSULIN REGULAR(HUMAN) IN NACL 100-0.9 UT/100ML-% IV SOLN
INTRAVENOUS | Status: AC
  Administered 2019-07-12: 4 [IU]/h via INTRAVENOUS
  Filled 2019-07-11: qty 100

## 2019-07-11 MED ORDER — MAGNESIUM SULFATE 50 % IJ SOLN
40.0000 meq | INTRAMUSCULAR | Status: DC
  Filled 2019-07-11: qty 9.85

## 2019-07-11 MED ORDER — SODIUM CHLORIDE 0.9 % IV SOLN
750.0000 mg | INTRAVENOUS | Status: AC
  Administered 2019-07-12: 14:00:00 750 mg via INTRAVENOUS
  Filled 2019-07-11: qty 750

## 2019-07-11 MED ORDER — PHENYLEPHRINE HCL-NACL 20-0.9 MG/250ML-% IV SOLN
30.0000 ug/min | INTRAVENOUS | Status: AC
  Administered 2019-07-12: 15 ug/min via INTRAVENOUS
  Filled 2019-07-11: qty 250

## 2019-07-11 MED ORDER — MILRINONE LACTATE IN DEXTROSE 20-5 MG/100ML-% IV SOLN
0.3000 ug/kg/min | INTRAVENOUS | Status: AC
  Administered 2019-07-12: .25 ug/kg/min via INTRAVENOUS
  Filled 2019-07-11: qty 100

## 2019-07-11 MED ORDER — TRANEXAMIC ACID (OHS) BOLUS VIA INFUSION
15.0000 mg/kg | INTRAVENOUS | Status: AC
  Administered 2019-07-12: 09:00:00 1755 mg via INTRAVENOUS
  Filled 2019-07-11: qty 1755

## 2019-07-11 MED ORDER — EPINEPHRINE HCL 5 MG/250ML IV SOLN IN NS
0.0000 ug/min | INTRAVENOUS | Status: DC
  Filled 2019-07-11: qty 250

## 2019-07-11 NOTE — Anesthesia Preprocedure Evaluation (Addendum)
Anesthesia Evaluation  Patient identified by MRN, date of birth, ID band Patient awake    Reviewed: Allergy & Precautions, NPO status , Patient's Chart, lab work & pertinent test results  Airway Mallampati: I  TM Distance: >3 FB Neck ROM: Full    Dental  (+) Teeth Intact, Dental Advisory Given   Pulmonary COPD,  COPD inhaler, former smoker,    breath sounds clear to auscultation       Cardiovascular hypertension, Pt. on home beta blockers + CAD and + Peripheral Vascular Disease   Rhythm:Regular Rate:Normal     Neuro/Psych  Headaches, negative psych ROS   GI/Hepatic Neg liver ROS, GERD  Medicated,  Endo/Other  diabetes, Type 2, Oral Hypoglycemic AgentsHypothyroidism   Renal/GU Renal disease     Musculoskeletal  (+) Arthritis ,   Abdominal Normal abdominal exam  (+)   Peds  Hematology negative hematology ROS (+)   Anesthesia Other Findings   Reproductive/Obstetrics                           Lab Results  Component Value Date   WBC 7.5 07/08/2019   HGB 15.5 07/08/2019   HCT 47.2 07/08/2019   MCV 99.6 07/08/2019   PLT 171 07/08/2019   Lab Results  Component Value Date   CREATININE 2.50 (H) 07/08/2019   BUN 32 (H) 07/08/2019   NA 141 07/08/2019   K 4.3 07/08/2019   CL 107 07/08/2019   CO2 23 07/08/2019   Lab Results  Component Value Date   INR 1.0 07/08/2019   INR 1.0 06/30/2019   Echo: 1. Left ventricular ejection fraction, by visual estimation, is 60 to  65%. The left ventricle has normal function. There is no left ventricular  hypertrophy.  2. Left ventricular diastolic parameters are consistent with Grade I  diastolic dysfunction (impaired relaxation).  3. The left ventricle has no regional wall motion abnormalities.  4. Global right ventricle has normal systolic function.The right  ventricular size is normal. No increase in right ventricular wall  thickness.  5. Left  atrial size was normal.  6. Right atrial size was normal.  7. The mitral valve is grossly normal. No evidence of mitral valve  regurgitation.  8. The tricuspid valve is not well visualized.  9. The tricuspid valve is not well visualized. Tricuspid valve  regurgitation is not demonstrated.  10. The aortic valve is grossly normal. Aortic valve regurgitation is not  visualized. No evidence of aortic valve sclerosis or stenosis.  11. The pulmonic valve was normal in structure. Pulmonic valve  regurgitation is not visualized.  12. Aortic dilatation noted.  13. There is moderate dilatation of the ascending aorta measuring 41 mm.  14. The atrial septum is grossly normal.   Anesthesia Physical Anesthesia Plan  ASA: IV  Anesthesia Plan: General   Post-op Pain Management:    Induction: Intravenous  PONV Risk Score and Plan: 1 and Ondansetron and Treatment may vary due to age or medical condition  Airway Management Planned: Oral ETT  Additional Equipment: Arterial line, CVP, PA Cath and TEE  Intra-op Plan:   Post-operative Plan: Post-operative intubation/ventilation  Informed Consent: I have reviewed the patients History and Physical, chart, labs and discussed the procedure including the risks, benefits and alternatives for the proposed anesthesia with the patient or authorized representative who has indicated his/her understanding and acceptance.     Dental advisory given  Plan Discussed with: CRNA  Anesthesia Plan Comments: (  PAT note written 07/11/2019 by Myra Gianotti, PA-C.   )     Anesthesia Quick Evaluation

## 2019-07-11 NOTE — Progress Notes (Addendum)
Anesthesia Chart Review:  Case: [id number] Date/Time: 07/12/19 0715   Procedures:      CORONARY ARTERY BYPASS GRAFTING (CABG) (N/A Chest)     TRANSESOPHAGEAL ECHOCARDIOGRAM (TEE) (N/A )   Anesthesia type: General   Pre-op diagnosis: CAD   Location: [facility] OR ROOM 17 / [place] [facility]   Surgeons: Ivin Poot, MD      DISCUSSION: Patient is a 79 year old male scheduled for the above procedure.  History includes former smoker, CAD, HTN, HLD, COPD, DM2, GERD, exertional dyspnea, renal cell cancer (s/p robotic assisted lap left radical nephrectomy 10/08/11), CKD (stage III), oral cancer (SCC right tongue, s/p partial glossectomy 12/07/09), "blood clot" in RLE > 20 years ago. For anesthesia history, he reported he is slow to wake up after nephrectomy and his daughter "couldn't take for 3-4 days after anesthesia".  06/23/19 chest CT showed 4.0 cm ascending TAA, indeterminate liver nodule (CT abd/pelvis ordered to further evaluate), and 3 mm LUL pulmonary nodule consider 12 month follow-up in 12 months if high-risk. BMI is consistent with morbid obesity.  A1c 9.7%. Labs marked as reviewed by surgeon. Will order DM Coordinator consult to see while hospitalized. CT abd/pelvis  (indeterminate 1.8 cm soft tissue nodule anterior to the liver 06/23/19 CT Chest), results are pending from 07/11/19.    07/08/19 presurgical COVID-19 test negative.  Anesthesia team to evaluate on the day of surgery.   VS: BP (!) 136/97   Pulse 69   Temp 36.6 C   Resp 18   Ht 5\' 7"  (1.702 m)   Wt 117 kg   SpO2 97%   BMI 40.41 kg/m    PROVIDERS: Silverio Decamp, MD is PCP  Kirk Ruths, MD is cardiologist Loura Back, MD is nephrologist [facility] Everywhere) Morrison Old, MD is urologist ([place]) Francina Ames, MD is ENT ([place])   LABS: Preoperative labs noted. Cr 2.50, which appears within his baseline (2.04-2.74 since 07/2017) and has known CKD. A1c 9.7%. Labs marked as  reviewed by surgeon.  (all labs ordered are listed, but only abnormal results are displayed)  Labs Reviewed  SURGICAL PCR SCREEN - Abnormal; Notable for the following components:      Result Value   Staphylococcus aureus POSITIVE (*)    All other components within normal limits  GLUCOSE, CAPILLARY - Abnormal; Notable for the following components:   Glucose-Capillary 168 (*)    All other components within normal limits  APTT - Abnormal; Notable for the following components:   aPTT 23 (*)    All other components within normal limits  BLOOD GAS, ARTERIAL - Abnormal; Notable for the following components:   Acid-Base Excess 2.2 (*)    Allens test (pass/fail) BRACHIAL ARTERY (*)    All other components within normal limits  COMPREHENSIVE METABOLIC PANEL - Abnormal; Notable for the following components:   Glucose, Bld 131 (*)    BUN 32 (*)    Creatinine, Ser 2.50 (*)    GFR calc non Af Amer 24 (*)    GFR calc Af Amer 28 (*)    All other components within normal limits  HEMOGLOBIN A1C - Abnormal; Notable for the following components:   Hgb A1c MFr Bld 9.7 (*)    All other components within normal limits  URINALYSIS, ROUTINE W REFLEX MICROSCOPIC - Abnormal; Notable for the following components:   Color, Urine AMBER (*)    APPearance HAZY (*)    All other components within normal limits  CBC  PROTIME-INR  TYPE AND SCREEN  ABO/RH    PFTs 07/11/19: FVC 3.41 (93%), post 86%. FEV1 2.52 (96%), post 96%. DLCO unc 19.99 (88%), cor 19.51 (86%).   IMAGES: CXR 07/08/19: Impression: No acute intrathoracic process.  CT Chest 06/23/19: IMPRESSION: 1. 1.8 cm soft tissue nodule anterior to the liver, indeterminate. However, omental metastatic disease could have this appearance. Consider dedicated CT abdomen/pelvis with contrast to further evaluate. 2. Ascending thoracic aorta measures 4 cm diameter. Recommend annual imaging followup by CTA or MRA. This recommendation follows  2010 ACCF/AHA/AATS/ACR/ASA/SCA/SCAI/SIR/STS/SVM Guidelines for the Diagnosis and Management of Patients with Thoracic Aortic Disease. Circulation. 2010; 121: H852-D782. Aortic aneurysm NOS (ICD10-I71.9) 3. 3 mm noncalcified left upper lobe pulmonary nodule. No follow-up needed if patient is low-risk. Non-contrast chest CT can be considered in 12 months if patient is high-risk. This recommendation follows the consensus statement: Guidelines for Management of Incidental Pulmonary Nodules Detected on CT Images: From the Fleischner Society 2017; Radiology 2017; 284:228-243. 4. Aortic Atherosclerosis (ICD10-I70.0). - CT Chest ordered by Dr. Stanford Breed. Patient has pending CT abd/pelvis ordered.   EKG: 07/01/19: Normal sinus rhythm Low voltage QRS Borderline ECG Confirmed by Sherren Mocha [phone]) on 07/01/2019 6:54:07 AM   CV: Carotid US 07/08/19: Summary:  - Right Carotid: Velocities in the right ICA are consistent with a 1-39%  stenosis.  - Left Carotid: Velocities in the left ICA are consistent with a 1-39%  stenosis.  - Vertebrals: Bilateral vertebral arteries demonstrate antegrade flow.  - Subclavians: Normal flow hemodynamics were seen in bilateral subclavian        arteries.    Cardiac cath 07/01/19: Conclusions  Eccentric distal left main, 60 to 70%.  Proximal 50 to 60% LAD stenosis.  Widely patent ramus intermedius.  60 to 70% proximal circumflex.  40% ostial codominant RCA.  Normal LVEDP.  Left ventriculography was not performed due to CKD. RECOMMENDATIONS:  Additional hydration today for at least 4 hours and potentially overnight.  Basic metabolic panel should be done at [facility] on Monday.  [facility] has been contacted about surgical consultation.  This could be done as an outpatient as the patient is clinically stable.   Echo 05/31/19: IMPRESSIONS  1. Left ventricular ejection fraction, by visual estimation, is 60 to  65%. The left ventricle has  normal function. There is no left ventricular  hypertrophy.  2. Left ventricular diastolic parameters are consistent with Grade I  diastolic dysfunction (impaired relaxation).  3. The left ventricle has no regional wall motion abnormalities.  4. Global right ventricle has normal systolic function.The right  ventricular size is normal. No increase in right ventricular wall  thickness.  5. Left atrial size was normal.  6. Right atrial size was normal.  7. The mitral valve is grossly normal. No evidence of mitral valve  regurgitation.  8. The tricuspid valve is not well visualized.  9. The tricuspid valve is not well visualized. Tricuspid valve  regurgitation is not demonstrated.  10. The aortic valve is grossly normal. Aortic valve regurgitation is not  visualized. No evidence of aortic valve sclerosis or stenosis.  11. The pulmonic valve was normal in structure. Pulmonic valve  regurgitation is not visualized.  12. Aortic dilatation noted.  13. There is moderate dilatation of the ascending aorta measuring 41 mm.  14. The atrial septum is grossly normal.    Past Medical History:  Diagnosis Date  . Arthritis   . CAD (coronary artery disease)    07/01/2019: LHC with LM  disease. Needs CABG.  . Cancer [facility]) 2013   left kidney  . Complication of anesthesia    slow to wake up after kidney stone surgery  . Constipation, chronic   . COPD (chronic obstructive pulmonary disease) ([place])   . Diabetes mellitus ([place])   . Dyspnea    with exertion  . Family history of adverse reaction to anesthesia    daughter couldn't talk for 3-4 days after anesthesia   . Frequent urination   . GERD (gastroesophageal reflux disease)   . Headache   . Hernia   . History of kidney stones   . Hyperlipidemia   . Hypertension   . Peripheral vascular disease ([place])    right leg blood clot 20 plus years ago  . Pneumonia   . Renal insufficiency    stage 2 (only 1 kidney) had Kidney cancer  .  Spondylolysis   . Tongue cancer ([place]) 2011    Past Surgical History:  Procedure Laterality Date  . COLONOSCOPY    . HERNIA REPAIR     abdominal  . KIDNEY STONE SURGERY    . LEFT HEART CATH AND CORONARY ANGIOGRAPHY N/A 07/01/2019   Procedure: LEFT HEART CATH AND CORONARY ANGIOGRAPHY;  Surgeon: Belva Crome, MD;  Location: [place] [facility];  Service: Cardiovascular;  Laterality: N/A;  . NEPHRECTOMY     left    MEDICATIONS: . acetaminophen (TYLENOL) 325 MG tablet  . Albuterol Sulfate (PROAIR RESPICLICK) 295 (90 BASE) MCG/ACT AEPB  . aspirin 325 MG tablet  . atorvastatin (LIPITOR) 80 MG tablet  . azelastine (ASTELIN) 0.1 % nasal spray  . B Complex Vitamins (B COMPLEX PO)  . Cholecalciferol (VITAMIN D3) 10 MCG (400 UNIT) CHEW  . Continuous Blood Gluc Receiver ([place]) [place]  . Continuous Blood Gluc Sensor (DEXCOM G6 SENSOR) MISC  . Continuous Blood Gluc Transmit (DEXCOM G6 TRANSMITTER) MISC  . Dulaglutide (TRULICITY) 4.5 AO/1.3YQ SOPN  . gabapentin (NEURONTIN) 300 MG capsule  . glipiZIDE (GLUCOTROL) 10 MG tablet  . metoprolol succinate (TOPROL XL) 25 MG 24 hr tablet  . Multiple Vitamins-Minerals (MULTIVITAMIN ADULTS 50+ PO)  . nitroGLYCERIN (NITROSTAT) 0.4 MG SL tablet  . ondansetron (ZOFRAN-ODT) 8 MG disintegrating tablet  . pantoprazole (PROTONIX) 40 MG tablet  . tamsulosin (FLOMAX) 0.4 MG CAPS capsule  . TRELEGY ELLIPTA 100-62.5-25 MCG/INH AEPB  . triamcinolone (NASACORT) 55 MCG/ACT AERO nasal inhaler   No current facility-administered medications for this encounter.   Derrill Memo ON 07/12/2019] cefUROXime (ZINACEF) 1.5 g in sodium chloride 0.9 % 100 mL IVPB  . [START ON 07/12/2019] cefUROXime (ZINACEF) 750 mg in sodium chloride 0.9 % 100 mL IVPB  . [START ON 07/12/2019] dexmedetomidine (PRECEDEX) 400 MCG/100ML (4 mcg/mL) infusion  . [START ON 07/12/2019] EPINEPHrine (ADRENALIN) 4 mg in NS 250 mL (0.016 mg/mL) premix infusion  . [START ON 07/12/2019] heparin 2,500 Units,  papaverine 30 mg in electrolyte-148 (PLASMALYTE-148) 500 mL irrigation  . [START ON 07/12/2019] heparin 30,000 units/NS 1000 mL solution for CELLSAVER  . [START ON 07/12/2019] insulin regular, human (MYXREDLIN) 100 units/ 100 mL infusion  . [START ON 07/12/2019] magnesium sulfate (IV Push/IM) injection 40 mEq  . [START ON 07/12/2019] milrinone (PRIMACOR) 20 MG/100 ML (0.2 mg/mL) infusion  . [START ON 07/12/2019] nitroGLYCERIN 50 mg in dextrose 5 % 250 mL (0.2 mg/mL) infusion  . [START ON 07/12/2019] norepinephrine (LEVOPHED) 4mg  in 278mL premix infusion  . [START ON 07/12/2019] phenylephrine (NEOSYNEPHRINE) 20-0.9 MG/250ML-% infusion  . [START ON 07/12/2019] potassium  chloride injection 80 mEq  . [START ON 07/12/2019] tranexamic acid (CYKLOKAPRON) 2,500 mg in sodium chloride 0.9 % 250 mL (10 mg/mL) infusion  . [START ON 07/12/2019] tranexamic acid (CYKLOKAPRON) bolus via infusion - over 30 minutes 1,755 mg  . [START ON 07/12/2019] tranexamic acid (CYKLOKAPRON) pump prime solution 234 mg  . [START ON 07/12/2019] vancomycin (VANCOREADY) IVPB 1500 mg/300 mL    Myra Gianotti, PA-C Surgical Short Stay/Anesthesiology [facility] Phone [phone] [facility] Phone [phone] 07/11/2019 1:31 PM

## 2019-07-12 ENCOUNTER — Inpatient Hospital Stay (HOSPITAL_COMMUNITY)
Admission: RE | Admit: 2019-07-12 | Discharge: 2019-07-20 | DRG: 236 | Disposition: A | Discharge status: Home Health | Payer: Medicare Other | Attending: Cardiothoracic Surgery | Admitting: Cardiothoracic Surgery

## 2019-07-12 ENCOUNTER — Inpatient Hospital Stay (HOSPITAL_COMMUNITY): Payer: Medicare Other

## 2019-07-12 ENCOUNTER — Inpatient Hospital Stay (HOSPITAL_COMMUNITY): Payer: Medicare Other | Admitting: Vascular Surgery

## 2019-07-12 ENCOUNTER — Encounter (HOSPITAL_COMMUNITY)
Admission: RE | Disposition: A | Discharge status: Home Health | Payer: Self-pay | Source: Home / Self Care | Attending: Cardiothoracic Surgery

## 2019-07-12 ENCOUNTER — Encounter (HOSPITAL_COMMUNITY): Payer: Self-pay | Admitting: Cardiothoracic Surgery

## 2019-07-12 ENCOUNTER — Ambulatory Visit: Payer: Medicare Other | Admitting: Sports Medicine

## 2019-07-12 ENCOUNTER — Other Ambulatory Visit: Payer: Self-pay

## 2019-07-12 DIAGNOSIS — D696 Thrombocytopenia, unspecified: Secondary | ICD-10-CM | POA: Diagnosis present

## 2019-07-12 DIAGNOSIS — N189 Chronic kidney disease, unspecified: Secondary | ICD-10-CM | POA: Diagnosis present

## 2019-07-12 DIAGNOSIS — Z85528 Personal history of other malignant neoplasm of kidney: Secondary | ICD-10-CM

## 2019-07-12 DIAGNOSIS — I130 Hypertensive heart and chronic kidney disease with heart failure and stage 1 through stage 4 chronic kidney disease, or unspecified chronic kidney disease: Secondary | ICD-10-CM | POA: Diagnosis not present

## 2019-07-12 DIAGNOSIS — Z905 Acquired absence of kidney: Secondary | ICD-10-CM | POA: Diagnosis not present

## 2019-07-12 DIAGNOSIS — N179 Acute kidney failure, unspecified: Secondary | ICD-10-CM | POA: Diagnosis not present

## 2019-07-12 DIAGNOSIS — Z7982 Long term (current) use of aspirin: Secondary | ICD-10-CM

## 2019-07-12 DIAGNOSIS — J449 Chronic obstructive pulmonary disease, unspecified: Secondary | ICD-10-CM | POA: Diagnosis not present

## 2019-07-12 DIAGNOSIS — I2510 Atherosclerotic heart disease of native coronary artery without angina pectoris: Secondary | ICD-10-CM | POA: Diagnosis not present

## 2019-07-12 DIAGNOSIS — E1122 Type 2 diabetes mellitus with diabetic chronic kidney disease: Secondary | ICD-10-CM | POA: Diagnosis present

## 2019-07-12 DIAGNOSIS — Z951 Presence of aortocoronary bypass graft: Secondary | ICD-10-CM

## 2019-07-12 DIAGNOSIS — Z20822 Contact with and (suspected) exposure to covid-19: Secondary | ICD-10-CM | POA: Diagnosis not present

## 2019-07-12 DIAGNOSIS — N400 Enlarged prostate without lower urinary tract symptoms: Secondary | ICD-10-CM | POA: Diagnosis not present

## 2019-07-12 DIAGNOSIS — Z809 Family history of malignant neoplasm, unspecified: Secondary | ICD-10-CM | POA: Diagnosis not present

## 2019-07-12 DIAGNOSIS — Z7984 Long term (current) use of oral hypoglycemic drugs: Secondary | ICD-10-CM

## 2019-07-12 DIAGNOSIS — Z87442 Personal history of urinary calculi: Secondary | ICD-10-CM | POA: Diagnosis not present

## 2019-07-12 DIAGNOSIS — Z4682 Encounter for fitting and adjustment of non-vascular catheter: Secondary | ICD-10-CM | POA: Diagnosis not present

## 2019-07-12 DIAGNOSIS — Z8249 Family history of ischemic heart disease and other diseases of the circulatory system: Secondary | ICD-10-CM

## 2019-07-12 DIAGNOSIS — Z79899 Other long term (current) drug therapy: Secondary | ICD-10-CM | POA: Diagnosis not present

## 2019-07-12 DIAGNOSIS — J9811 Atelectasis: Secondary | ICD-10-CM | POA: Diagnosis not present

## 2019-07-12 DIAGNOSIS — I129 Hypertensive chronic kidney disease with stage 1 through stage 4 chronic kidney disease, or unspecified chronic kidney disease: Secondary | ICD-10-CM | POA: Diagnosis not present

## 2019-07-12 DIAGNOSIS — N183 Chronic kidney disease, stage 3 unspecified: Secondary | ICD-10-CM | POA: Diagnosis not present

## 2019-07-12 DIAGNOSIS — J90 Pleural effusion, not elsewhere classified: Secondary | ICD-10-CM | POA: Diagnosis not present

## 2019-07-12 DIAGNOSIS — Z87891 Personal history of nicotine dependence: Secondary | ICD-10-CM

## 2019-07-12 DIAGNOSIS — I25110 Atherosclerotic heart disease of native coronary artery with unstable angina pectoris: Principal | ICD-10-CM | POA: Diagnosis present

## 2019-07-12 DIAGNOSIS — I340 Nonrheumatic mitral (valve) insufficiency: Secondary | ICD-10-CM | POA: Diagnosis not present

## 2019-07-12 DIAGNOSIS — E785 Hyperlipidemia, unspecified: Secondary | ICD-10-CM | POA: Diagnosis not present

## 2019-07-12 DIAGNOSIS — Z419 Encounter for procedure for purposes other than remedying health state, unspecified: Secondary | ICD-10-CM

## 2019-07-12 HISTORY — PX: CENTRAL VENOUS CATHETER INSERTION: SHX401

## 2019-07-12 HISTORY — PX: TEE WITHOUT CARDIOVERSION: SHX5443

## 2019-07-12 HISTORY — PX: CORONARY ARTERY BYPASS GRAFT: SHX141

## 2019-07-12 LAB — POCT I-STAT, CHEM 8
BUN: 33 mg/dL — ABNORMAL HIGH (ref 8–23)
BUN: 38 mg/dL — ABNORMAL HIGH (ref 8–23)
BUN: 32 mg/dL — ABNORMAL HIGH (ref 8–23)
BUN: 28 mg/dL — ABNORMAL HIGH (ref 8–23)
BUN: 24 mg/dL — ABNORMAL HIGH (ref 8–23)
BUN: 21 mg/dL (ref 8–23)
Calcium, Ion: 1.16 mmol/L (ref 1.15–1.40)
Calcium, Ion: 1.2 mmol/L (ref 1.15–1.40)
Calcium, Ion: 1 mmol/L — ABNORMAL LOW (ref 1.15–1.40)
Calcium, Ion: 1.01 mmol/L — ABNORMAL LOW (ref 1.15–1.40)
Calcium, Ion: 1.03 mmol/L — ABNORMAL LOW (ref 1.15–1.40)
Calcium, Ion: 0.94 mmol/L — ABNORMAL LOW (ref 1.15–1.40)
Chloride: 105 mmol/L (ref 98–111)
Chloride: 106 mmol/L (ref 98–111)
Chloride: 105 mmol/L (ref 98–111)
Chloride: 108 mmol/L (ref 98–111)
Chloride: 114 mmol/L — ABNORMAL HIGH (ref 98–111)
Chloride: 116 mmol/L — ABNORMAL HIGH (ref 98–111)
Creatinine, Ser: 2.2 mg/dL — ABNORMAL HIGH (ref 0.61–1.24)
Creatinine, Ser: 2.1 mg/dL — ABNORMAL HIGH (ref 0.61–1.24)
Creatinine, Ser: 2 mg/dL — ABNORMAL HIGH (ref 0.61–1.24)
Creatinine, Ser: 1.8 mg/dL — ABNORMAL HIGH (ref 0.61–1.24)
Creatinine, Ser: 1.5 mg/dL — ABNORMAL HIGH (ref 0.61–1.24)
Creatinine, Ser: 1.3 mg/dL — ABNORMAL HIGH (ref 0.61–1.24)
Glucose, Bld: 178 mg/dL — ABNORMAL HIGH (ref 70–99)
Glucose, Bld: 152 mg/dL — ABNORMAL HIGH (ref 70–99)
Glucose, Bld: 126 mg/dL — ABNORMAL HIGH (ref 70–99)
Glucose, Bld: 129 mg/dL — ABNORMAL HIGH (ref 70–99)
Glucose, Bld: 132 mg/dL — ABNORMAL HIGH (ref 70–99)
Glucose, Bld: 126 mg/dL — ABNORMAL HIGH (ref 70–99)
HCT: 40 % (ref 39.0–52.0)
HCT: 38 % — ABNORMAL LOW (ref 39.0–52.0)
HCT: 26 % — ABNORMAL LOW (ref 39.0–52.0)
HCT: 27 % — ABNORMAL LOW (ref 39.0–52.0)
HCT: 22 % — ABNORMAL LOW (ref 39.0–52.0)
HCT: 23 % — ABNORMAL LOW (ref 39.0–52.0)
Hemoglobin: 13.6 g/dL (ref 13.0–17.0)
Hemoglobin: 12.9 g/dL — ABNORMAL LOW (ref 13.0–17.0)
Hemoglobin: 8.8 g/dL — ABNORMAL LOW (ref 13.0–17.0)
Hemoglobin: 9.2 g/dL — ABNORMAL LOW (ref 13.0–17.0)
Hemoglobin: 7.5 g/dL — ABNORMAL LOW (ref 13.0–17.0)
Hemoglobin: 7.8 g/dL — ABNORMAL LOW (ref 13.0–17.0)
Potassium: 4.3 mmol/L (ref 3.5–5.1)
Potassium: 4.6 mmol/L (ref 3.5–5.1)
Potassium: 4.4 mmol/L (ref 3.5–5.1)
Potassium: 4.6 mmol/L (ref 3.5–5.1)
Potassium: 3.7 mmol/L (ref 3.5–5.1)
Potassium: 3.5 mmol/L (ref 3.5–5.1)
Sodium: 141 mmol/L (ref 135–145)
Sodium: 142 mmol/L (ref 135–145)
Sodium: 140 mmol/L (ref 135–145)
Sodium: 141 mmol/L (ref 135–145)
Sodium: 146 mmol/L — ABNORMAL HIGH (ref 135–145)
Sodium: 147 mmol/L — ABNORMAL HIGH (ref 135–145)
TCO2: 28 mmol/L (ref 22–32)
TCO2: 30 mmol/L (ref 22–32)
TCO2: 29 mmol/L (ref 22–32)
TCO2: 26 mmol/L (ref 22–32)
TCO2: 19 mmol/L — ABNORMAL LOW (ref 22–32)
TCO2: 22 mmol/L (ref 22–32)

## 2019-07-12 LAB — POCT I-STAT 7, (LYTES, BLD GAS, ICA,H+H)
Acid-Base Excess: 4 mmol/L — ABNORMAL HIGH (ref 0.0–2.0)
Acid-base deficit: 8 mmol/L — ABNORMAL HIGH (ref 0.0–2.0)
Acid-base deficit: 4 mmol/L — ABNORMAL HIGH (ref 0.0–2.0)
Bicarbonate: 29.7 mmol/L — ABNORMAL HIGH (ref 20.0–28.0)
Bicarbonate: 18.3 mmol/L — ABNORMAL LOW (ref 20.0–28.0)
Bicarbonate: 21.5 mmol/L (ref 20.0–28.0)
Calcium, Ion: 1.03 mmol/L — ABNORMAL LOW (ref 1.15–1.40)
Calcium, Ion: 1.04 mmol/L — ABNORMAL LOW (ref 1.15–1.40)
Calcium, Ion: 1.02 mmol/L — ABNORMAL LOW (ref 1.15–1.40)
HCT: 31 % — ABNORMAL LOW (ref 39.0–52.0)
HCT: 20 % — ABNORMAL LOW (ref 39.0–52.0)
HCT: 30 % — ABNORMAL LOW (ref 39.0–52.0)
Hemoglobin: 10.5 g/dL — ABNORMAL LOW (ref 13.0–17.0)
Hemoglobin: 6.8 g/dL — CL (ref 13.0–17.0)
Hemoglobin: 10.2 g/dL — ABNORMAL LOW (ref 13.0–17.0)
O2 Saturation: 100 %
O2 Saturation: 98 %
O2 Saturation: 95 %
Patient temperature: 35.8
Potassium: 4.3 mmol/L (ref 3.5–5.1)
Potassium: 3.7 mmol/L (ref 3.5–5.1)
Potassium: 4.5 mmol/L (ref 3.5–5.1)
Sodium: 139 mmol/L (ref 135–145)
Sodium: 148 mmol/L — ABNORMAL HIGH (ref 135–145)
Sodium: 145 mmol/L (ref 135–145)
TCO2: 31 mmol/L (ref 22–32)
TCO2: 19 mmol/L — ABNORMAL LOW (ref 22–32)
TCO2: 23 mmol/L (ref 22–32)
pCO2 arterial: 50.5 mmHg — ABNORMAL HIGH (ref 32.0–48.0)
pCO2 arterial: 37.7 mmHg (ref 32.0–48.0)
pCO2 arterial: 40.2 mmHg (ref 32.0–48.0)
pH, Arterial: 7.378 (ref 7.350–7.450)
pH, Arterial: 7.293 — ABNORMAL LOW (ref 7.350–7.450)
pH, Arterial: 7.331 — ABNORMAL LOW (ref 7.350–7.450)
pO2, Arterial: 354 mmHg — ABNORMAL HIGH (ref 83.0–108.0)
pO2, Arterial: 117 mmHg — ABNORMAL HIGH (ref 83.0–108.0)
pO2, Arterial: 75 mmHg — ABNORMAL LOW (ref 83.0–108.0)

## 2019-07-12 LAB — GLUCOSE, CAPILLARY
Glucose-Capillary: 143 mg/dL — ABNORMAL HIGH (ref 70–99)
Glucose-Capillary: 156 mg/dL — ABNORMAL HIGH (ref 70–99)
Glucose-Capillary: 158 mg/dL — ABNORMAL HIGH (ref 70–99)
Glucose-Capillary: 158 mg/dL — ABNORMAL HIGH (ref 70–99)
Glucose-Capillary: 161 mg/dL — ABNORMAL HIGH (ref 70–99)
Glucose-Capillary: 163 mg/dL — ABNORMAL HIGH (ref 70–99)
Glucose-Capillary: 163 mg/dL — ABNORMAL HIGH (ref 70–99)
Glucose-Capillary: 166 mg/dL — ABNORMAL HIGH (ref 70–99)
Glucose-Capillary: 175 mg/dL — ABNORMAL HIGH (ref 70–99)
Glucose-Capillary: 190 mg/dL — ABNORMAL HIGH (ref 70–99)

## 2019-07-12 LAB — APTT: aPTT: 30 seconds (ref 24–36)

## 2019-07-12 LAB — BASIC METABOLIC PANEL
Anion gap: 7 (ref 5–15)
BUN: 26 mg/dL — ABNORMAL HIGH (ref 8–23)
CO2: 20 mmol/L — ABNORMAL LOW (ref 22–32)
Calcium: 7.2 mg/dL — ABNORMAL LOW (ref 8.9–10.3)
Chloride: 116 mmol/L — ABNORMAL HIGH (ref 98–111)
Creatinine, Ser: 1.94 mg/dL — ABNORMAL HIGH (ref 0.61–1.24)
GFR calc Af Amer: 38 mL/min — ABNORMAL LOW (ref >60)
GFR calc non Af Amer: 32 mL/min — ABNORMAL LOW (ref >60)
Glucose, Bld: 170 mg/dL — ABNORMAL HIGH (ref 70–99)
Potassium: 4.8 mmol/L (ref 3.5–5.1)
Sodium: 143 mmol/L (ref 135–145)

## 2019-07-12 LAB — CBC
HCT: 31.8 % — ABNORMAL LOW (ref 39.0–52.0)
HCT: 32 % — ABNORMAL LOW (ref 39.0–52.0)
Hemoglobin: 10.5 g/dL — ABNORMAL LOW (ref 13.0–17.0)
Hemoglobin: 10.6 g/dL — ABNORMAL LOW (ref 13.0–17.0)
MCH: 32.7 pg (ref 26.0–34.0)
MCH: 32.1 pg (ref 26.0–34.0)
MCHC: 33 g/dL (ref 30.0–36.0)
MCHC: 33.1 g/dL (ref 30.0–36.0)
MCV: 99.1 fL (ref 80.0–100.0)
MCV: 97 fL (ref 80.0–100.0)
Platelets: 93 10*3/uL — ABNORMAL LOW (ref 150–400)
Platelets: 104 10*3/uL — ABNORMAL LOW (ref 150–400)
RBC: 3.21 MIL/uL — ABNORMAL LOW (ref 4.22–5.81)
RBC: 3.3 MIL/uL — ABNORMAL LOW (ref 4.22–5.81)
RDW: 13.1 % (ref 11.5–15.5)
RDW: 13.3 % (ref 11.5–15.5)
WBC: 8.9 10*3/uL (ref 4.0–10.5)
WBC: 12.1 10*3/uL — ABNORMAL HIGH (ref 4.0–10.5)
nRBC: 0 % (ref 0.0–0.2)
nRBC: 0 % (ref 0.0–0.2)

## 2019-07-12 LAB — COOXEMETRY PANEL
Carboxyhemoglobin: 1.4 % (ref 0.5–1.5)
Methemoglobin: 1.3 % (ref 0.0–1.5)
O2 Saturation: 46.8 %
Total hemoglobin: 12 g/dL (ref 12.0–16.0)

## 2019-07-12 LAB — ECHO INTRAOPERATIVE TEE
Height: 67 in
Weight: 4080 oz

## 2019-07-12 LAB — PROTIME-INR
INR: 1.6 — ABNORMAL HIGH (ref 0.8–1.2)
Prothrombin Time: 18.5 seconds — ABNORMAL HIGH (ref 11.4–15.2)

## 2019-07-12 LAB — PLATELET COUNT: Platelets: 94 10*3/uL — ABNORMAL LOW (ref 150–400)

## 2019-07-12 LAB — MAGNESIUM: Magnesium: 1.8 mg/dL (ref 1.7–2.4)

## 2019-07-12 LAB — HEMOGLOBIN AND HEMATOCRIT, BLOOD
HCT: 28.2 % — ABNORMAL LOW (ref 39.0–52.0)
Hemoglobin: 9.4 g/dL — ABNORMAL LOW (ref 13.0–17.0)

## 2019-07-12 SURGERY — CORONARY ARTERY BYPASS GRAFTING (CABG)
Anesthesia: General | Site: Neck | Laterality: Right

## 2019-07-12 MED ORDER — ATORVASTATIN CALCIUM 80 MG PO TABS
80.0000 mg | ORAL_TABLET | Freq: Every day | ORAL | Status: DC
  Administered 2019-07-13 – 2019-07-19 (×8): 80 mg via ORAL
  Filled 2019-07-12 (×9): qty 1

## 2019-07-12 MED ORDER — SODIUM BICARBONATE 8.4 % IV SOLN
INTRAVENOUS | Status: DC | PRN
  Administered 2019-07-12: 50 meq via INTRAVENOUS

## 2019-07-12 MED ORDER — LACTATED RINGERS IV SOLN: INTRAVENOUS | Status: DC | PRN

## 2019-07-12 MED ORDER — PROTAMINE SULFATE 10 MG/ML IV SOLN
INTRAVENOUS | Status: AC
  Filled 2019-07-12: qty 10

## 2019-07-12 MED ORDER — SODIUM CHLORIDE 0.9% FLUSH: 10.0000 mL | INTRAVENOUS | Status: DC | PRN

## 2019-07-12 MED ORDER — MIDAZOLAM HCL 2 MG/2ML IJ SOLN: 2.0000 mg | INTRAMUSCULAR | Status: DC | PRN

## 2019-07-12 MED ORDER — DEXTROSE 50 % IV SOLN: 0.0000 mL | INTRAVENOUS | Status: DC | PRN

## 2019-07-12 MED ORDER — FENTANYL CITRATE (PF) 250 MCG/5ML IJ SOLN
INTRAMUSCULAR | Status: DC | PRN
  Administered 2019-07-12: 50 ug via INTRAVENOUS
  Administered 2019-07-12: 100 ug via INTRAVENOUS
  Administered 2019-07-12: 50 ug via INTRAVENOUS
  Administered 2019-07-12: 100 ug via INTRAVENOUS
  Administered 2019-07-12: 50 ug via INTRAVENOUS
  Administered 2019-07-12: 200 ug via INTRAVENOUS
  Administered 2019-07-12: 50 ug via INTRAVENOUS
  Administered 2019-07-12: 200 ug via INTRAVENOUS
  Administered 2019-07-12: 100 ug via INTRAVENOUS
  Administered 2019-07-12: 50 ug via INTRAVENOUS
  Administered 2019-07-12: 250 ug via INTRAVENOUS
  Administered 2019-07-12: 50 ug via INTRAVENOUS

## 2019-07-12 MED ORDER — EPINEPHRINE 1 MG/10ML IJ SOSY
PREFILLED_SYRINGE | INTRAMUSCULAR | Status: AC
  Filled 2019-07-12: qty 20

## 2019-07-12 MED ORDER — SODIUM CHLORIDE 0.9% FLUSH
10.0000 mL | Freq: Two times a day (BID) | INTRAVENOUS | Status: DC
  Administered 2019-07-12 – 2019-07-15 (×6): 10 mL
  Administered 2019-07-16: 20 mL

## 2019-07-12 MED ORDER — PLASMA-LYTE 148 IV SOLN
INTRAVENOUS | Status: DC | PRN
  Administered 2019-07-12: 500 mL via INTRAVASCULAR

## 2019-07-12 MED ORDER — EPHEDRINE 5 MG/ML INJ
INTRAVENOUS | Status: AC
  Filled 2019-07-12: qty 10

## 2019-07-12 MED ORDER — INSULIN REGULAR(HUMAN) IN NACL 100-0.9 UT/100ML-% IV SOLN
INTRAVENOUS | Status: DC
  Administered 2019-07-13: 6 [IU]/h via INTRAVENOUS
  Filled 2019-07-12 (×2): qty 100

## 2019-07-12 MED ORDER — SODIUM CHLORIDE 0.9% IV SOLUTION: Freq: Once | INTRAVENOUS | Status: DC

## 2019-07-12 MED ORDER — BISACODYL 10 MG RE SUPP
10.0000 mg | Freq: Every day | RECTAL | Status: DC
  Filled 2019-07-12: qty 1

## 2019-07-12 MED ORDER — SODIUM CHLORIDE 0.9 % IV SOLN
1.5000 g | Freq: Two times a day (BID) | INTRAVENOUS | Status: AC
  Administered 2019-07-12 – 2019-07-14 (×4): 1.5 g via INTRAVENOUS
  Filled 2019-07-12 (×4): qty 1.5

## 2019-07-12 MED ORDER — FENTANYL CITRATE (PF) 250 MCG/5ML IJ SOLN
INTRAMUSCULAR | Status: AC
  Filled 2019-07-12: qty 25

## 2019-07-12 MED ORDER — TRANEXAMIC ACID 1000 MG/10ML IV SOLN
1.5000 mg/kg/h | INTRAVENOUS | Status: DC
  Filled 2019-07-12: qty 25

## 2019-07-12 MED ORDER — SODIUM CHLORIDE 0.9 % IV SOLN
20.0000 ug | Freq: Once | INTRAVENOUS | Status: AC
  Administered 2019-07-12: 14:00:00 20 ug via INTRAVENOUS
  Filled 2019-07-12: qty 5

## 2019-07-12 MED ORDER — 0.9 % SODIUM CHLORIDE (POUR BTL) OPTIME
TOPICAL | Status: DC | PRN
  Administered 2019-07-12: 5000 mL

## 2019-07-12 MED ORDER — VANCOMYCIN HCL IN DEXTROSE 1-5 GM/200ML-% IV SOLN: 1000.0000 mg | Freq: Once | INTRAVENOUS | Status: DC

## 2019-07-12 MED ORDER — SUCCINYLCHOLINE CHLORIDE 200 MG/10ML IV SOSY
PREFILLED_SYRINGE | INTRAVENOUS | Status: AC
  Filled 2019-07-12: qty 10

## 2019-07-12 MED ORDER — ROCURONIUM BROMIDE 100 MG/10ML IV SOLN
INTRAVENOUS | Status: DC | PRN
  Administered 2019-07-12: 50 mg via INTRAVENOUS
  Administered 2019-07-12: 30 mg via INTRAVENOUS
  Administered 2019-07-12 (×2): 50 mg via INTRAVENOUS
  Administered 2019-07-12: 100 mg via INTRAVENOUS

## 2019-07-12 MED ORDER — ACETAMINOPHEN 500 MG PO TABS
1000.0000 mg | ORAL_TABLET | Freq: Four times a day (QID) | ORAL | Status: AC
  Administered 2019-07-12 – 2019-07-17 (×19): 1000 mg via ORAL
  Filled 2019-07-12 (×19): qty 2

## 2019-07-12 MED ORDER — ACETAMINOPHEN 160 MG/5ML PO SOLN: 1000.0000 mg | Freq: Four times a day (QID) | ORAL | Status: AC

## 2019-07-12 MED ORDER — ORAL CARE MOUTH RINSE
15.0000 mL | OROMUCOSAL | Status: DC
  Administered 2019-07-12: 21:00:00 15 mL via OROMUCOSAL

## 2019-07-12 MED ORDER — SODIUM CHLORIDE 0.9 % IV SOLN: INTRAVENOUS | Status: DC

## 2019-07-12 MED ORDER — FAMOTIDINE IN NACL 20-0.9 MG/50ML-% IV SOLN
INTRAVENOUS | Status: AC
  Administered 2019-07-12: 20 mg via INTRAVENOUS
  Filled 2019-07-12: qty 50

## 2019-07-12 MED ORDER — POTASSIUM CHLORIDE 10 MEQ/50ML IV SOLN
INTRAVENOUS | Status: AC
  Filled 2019-07-12: qty 150

## 2019-07-12 MED ORDER — CHLORHEXIDINE GLUCONATE 0.12% ORAL RINSE (MEDLINE KIT)
15.0000 mL | Freq: Two times a day (BID) | OROMUCOSAL | Status: DC
  Administered 2019-07-12: 15 mL via OROMUCOSAL

## 2019-07-12 MED ORDER — LIDOCAINE 2% (20 MG/ML) 5 ML SYRINGE
INTRAMUSCULAR | Status: AC
  Filled 2019-07-12: qty 5

## 2019-07-12 MED ORDER — PROPOFOL 10 MG/ML IV BOLUS
INTRAVENOUS | Status: DC | PRN
  Administered 2019-07-12: 60 mg via INTRAVENOUS

## 2019-07-12 MED ORDER — MIDAZOLAM HCL 5 MG/5ML IJ SOLN
INTRAMUSCULAR | Status: DC | PRN
  Administered 2019-07-12: 1 mg via INTRAVENOUS
  Administered 2019-07-12: 2 mg via INTRAVENOUS
  Administered 2019-07-12: 4 mg via INTRAVENOUS
  Administered 2019-07-12: 2 mg via INTRAVENOUS
  Administered 2019-07-12: 1 mg via INTRAVENOUS

## 2019-07-12 MED ORDER — VASOPRESSIN 20 UNIT/ML IV SOLN
INTRAVENOUS | Status: AC
  Filled 2019-07-12: qty 1

## 2019-07-12 MED ORDER — MILRINONE LACTATE IN DEXTROSE 20-5 MG/100ML-% IV SOLN
0.2500 ug/kg/min | INTRAVENOUS | Status: DC
  Administered 2019-07-12 – 2019-07-13 (×2): 0.3 ug/kg/min via INTRAVENOUS
  Administered 2019-07-13: 0.25 ug/kg/min via INTRAVENOUS
  Filled 2019-07-12 (×4): qty 100

## 2019-07-12 MED ORDER — METOPROLOL TARTRATE 12.5 MG HALF TABLET: 12.5000 mg | ORAL_TABLET | Freq: Once | ORAL | Status: DC

## 2019-07-12 MED ORDER — MORPHINE SULFATE (PF) 2 MG/ML IV SOLN
1.0000 mg | INTRAVENOUS | Status: DC | PRN
  Administered 2019-07-13 (×2): 2 mg via INTRAVENOUS
  Filled 2019-07-12 (×2): qty 1

## 2019-07-12 MED ORDER — LACTATED RINGERS IV SOLN: INTRAVENOUS | Status: DC

## 2019-07-12 MED ORDER — DOCUSATE SODIUM 100 MG PO CAPS
200.0000 mg | ORAL_CAPSULE | Freq: Every day | ORAL | Status: DC
  Administered 2019-07-13 – 2019-07-20 (×8): 200 mg via ORAL
  Filled 2019-07-12 (×8): qty 2

## 2019-07-12 MED ORDER — CALCIUM CHLORIDE 10 % IV SOLN
INTRAVENOUS | Status: AC
  Filled 2019-07-12: qty 10

## 2019-07-12 MED ORDER — POTASSIUM CHLORIDE 10 MEQ/50ML IV SOLN: 10.0000 meq | INTRAVENOUS | Status: AC

## 2019-07-12 MED ORDER — MAGNESIUM SULFATE 4 GM/100ML IV SOLN
2.0000 g | Freq: Once | INTRAVENOUS | Status: DC
  Filled 2019-07-12: qty 100

## 2019-07-12 MED ORDER — PHENYLEPHRINE HCL-NACL 20-0.9 MG/250ML-% IV SOLN
0.0000 ug/min | INTRAVENOUS | Status: DC
  Administered 2019-07-12: 35.067 ug/min via INTRAVENOUS
  Filled 2019-07-12: qty 250

## 2019-07-12 MED ORDER — CHLORHEXIDINE GLUCONATE 0.12 % MT SOLN
15.0000 mL | OROMUCOSAL | Status: AC
  Administered 2019-07-12: 15 mL via OROMUCOSAL

## 2019-07-12 MED ORDER — MAGNESIUM SULFATE 4 GM/100ML IV SOLN: 4.0000 g | Freq: Once | INTRAVENOUS | Status: DC

## 2019-07-12 MED ORDER — ALBUMIN HUMAN 5 % IV SOLN
250.0000 mL | INTRAVENOUS | Status: AC | PRN
  Administered 2019-07-12: 12.5 g via INTRAVENOUS

## 2019-07-12 MED ORDER — SODIUM BICARBONATE 8.4 % IV SOLN
INTRAVENOUS | Status: AC
  Filled 2019-07-12: qty 100

## 2019-07-12 MED ORDER — ALBUTEROL SULFATE (2.5 MG/3ML) 0.083% IN NEBU: 3.0000 mL | INHALATION_SOLUTION | RESPIRATORY_TRACT | Status: DC | PRN

## 2019-07-12 MED ORDER — ASPIRIN 81 MG PO CHEW
324.0000 mg | CHEWABLE_TABLET | Freq: Every day | ORAL | Status: DC
  Administered 2019-07-15: 324 mg
  Filled 2019-07-12: qty 4

## 2019-07-12 MED ORDER — CHLORHEXIDINE GLUCONATE 0.12 % MT SOLN
15.0000 mL | Freq: Once | OROMUCOSAL | Status: AC
  Administered 2019-07-12: 15 mL via OROMUCOSAL
  Filled 2019-07-12: qty 15

## 2019-07-12 MED ORDER — PHENYLEPHRINE 40 MCG/ML (10ML) SYRINGE FOR IV PUSH (FOR BLOOD PRESSURE SUPPORT)
PREFILLED_SYRINGE | INTRAVENOUS | Status: AC
  Filled 2019-07-12: qty 10

## 2019-07-12 MED ORDER — GABAPENTIN 300 MG PO CAPS
300.0000 mg | ORAL_CAPSULE | Freq: Every day | ORAL | Status: DC
  Administered 2019-07-13 – 2019-07-19 (×7): 300 mg via ORAL
  Filled 2019-07-12 (×7): qty 1

## 2019-07-12 MED ORDER — LIDOCAINE HCL (CARDIAC) PF 100 MG/5ML IV SOSY
PREFILLED_SYRINGE | INTRAVENOUS | Status: AC
  Filled 2019-07-12: qty 10

## 2019-07-12 MED ORDER — METOPROLOL TARTRATE 5 MG/5ML IV SOLN: 2.5000 mg | INTRAVENOUS | Status: DC | PRN

## 2019-07-12 MED ORDER — SODIUM CHLORIDE (PF) 0.9 % IJ SOLN
OROMUCOSAL | Status: DC | PRN
  Administered 2019-07-12 (×5): 4 mL via TOPICAL

## 2019-07-12 MED ORDER — SODIUM CHLORIDE 0.9% FLUSH: 3.0000 mL | INTRAVENOUS | Status: DC | PRN

## 2019-07-12 MED ORDER — ACETAMINOPHEN 160 MG/5ML PO SOLN: 650.0000 mg | Freq: Once | ORAL | Status: AC

## 2019-07-12 MED ORDER — PHENYLEPHRINE HCL-NACL 20-0.9 MG/250ML-% IV SOLN: 0.0000 ug/min | INTRAVENOUS | Status: DC

## 2019-07-12 MED ORDER — HEMOSTATIC AGENTS (NO CHARGE) OPTIME
TOPICAL | Status: DC | PRN
  Administered 2019-07-12 (×3): 1 via TOPICAL

## 2019-07-12 MED ORDER — DEXMEDETOMIDINE HCL IN NACL 200 MCG/50ML IV SOLN
INTRAVENOUS | Status: AC
  Filled 2019-07-12: qty 50

## 2019-07-12 MED ORDER — BISACODYL 5 MG PO TBEC
10.0000 mg | DELAYED_RELEASE_TABLET | Freq: Every day | ORAL | Status: DC
  Administered 2019-07-13 – 2019-07-19 (×5): 10 mg via ORAL
  Filled 2019-07-12 (×5): qty 2

## 2019-07-12 MED ORDER — PROTAMINE SULFATE 10 MG/ML IV SOLN
INTRAVENOUS | Status: DC | PRN
  Administered 2019-07-12: 34 mg via INTRAVENOUS

## 2019-07-12 MED ORDER — METOPROLOL TARTRATE 25 MG/10 ML ORAL SUSPENSION
12.5000 mg | Freq: Two times a day (BID) | ORAL | Status: DC
  Administered 2019-07-12: 12.5 mg
  Filled 2019-07-12: qty 5

## 2019-07-12 MED ORDER — HEPARIN SODIUM (PORCINE) 1000 UNIT/ML IJ SOLN
INTRAMUSCULAR | Status: DC | PRN
  Administered 2019-07-12: 3000 [IU] via INTRAVENOUS
  Administered 2019-07-12: 33000 [IU] via INTRAVENOUS
  Administered 2019-07-12: 5000 [IU] via INTRAVENOUS

## 2019-07-12 MED ORDER — SODIUM CHLORIDE 0.9 % IV SOLN: 250.0000 mL | INTRAVENOUS | Status: DC

## 2019-07-12 MED ORDER — LACTATED RINGERS IV SOLN: 500.0000 mL | Freq: Once | INTRAVENOUS | Status: DC | PRN

## 2019-07-12 MED ORDER — ASPIRIN EC 325 MG PO TBEC
325.0000 mg | DELAYED_RELEASE_TABLET | Freq: Every day | ORAL | Status: DC
  Administered 2019-07-13 – 2019-07-16 (×3): 325 mg via ORAL
  Filled 2019-07-12 (×4): qty 1

## 2019-07-12 MED ORDER — TRAMADOL HCL 50 MG PO TABS: 50.0000 mg | ORAL_TABLET | ORAL | Status: DC | PRN

## 2019-07-12 MED ORDER — SODIUM BICARBONATE 8.4 % IV SOLN
INTRAVENOUS | Status: AC
  Filled 2019-07-12: qty 50

## 2019-07-12 MED ORDER — ACETAMINOPHEN 650 MG RE SUPP
RECTAL | Status: AC
  Administered 2019-07-12: 650 mg via RECTAL
  Filled 2019-07-12: qty 1

## 2019-07-12 MED ORDER — DEXMEDETOMIDINE HCL IN NACL 400 MCG/100ML IV SOLN: 0.0000 ug/kg/h | INTRAVENOUS | Status: DC

## 2019-07-12 MED ORDER — MIDAZOLAM HCL (PF) 10 MG/2ML IJ SOLN
INTRAMUSCULAR | Status: AC
  Filled 2019-07-12: qty 2

## 2019-07-12 MED ORDER — OXYCODONE HCL 5 MG PO TABS
5.0000 mg | ORAL_TABLET | ORAL | Status: DC | PRN
  Administered 2019-07-13 – 2019-07-14 (×5): 10 mg via ORAL
  Administered 2019-07-15 – 2019-07-19 (×3): 5 mg via ORAL
  Filled 2019-07-12 (×2): qty 2
  Filled 2019-07-12 (×2): qty 1
  Filled 2019-07-12: qty 2
  Filled 2019-07-12: qty 1
  Filled 2019-07-12: qty 2
  Filled 2019-07-12: qty 1
  Filled 2019-07-12: qty 2

## 2019-07-12 MED ORDER — HEPARIN SODIUM (PORCINE) 1000 UNIT/ML IJ SOLN
INTRAMUSCULAR | Status: AC
  Filled 2019-07-12: qty 1

## 2019-07-12 MED ORDER — METOCLOPRAMIDE HCL 5 MG/ML IJ SOLN
INTRAMUSCULAR | Status: AC
  Filled 2019-07-12: qty 2

## 2019-07-12 MED ORDER — LIDOCAINE HCL (CARDIAC) PF 100 MG/5ML IV SOSY
PREFILLED_SYRINGE | INTRAVENOUS | Status: DC | PRN
  Administered 2019-07-12: 100 mg via INTRAVENOUS

## 2019-07-12 MED ORDER — ROCURONIUM BROMIDE 10 MG/ML (PF) SYRINGE
PREFILLED_SYRINGE | INTRAVENOUS | Status: AC
  Filled 2019-07-12: qty 10

## 2019-07-12 MED ORDER — ALBUMIN HUMAN 5 % IV SOLN
INTRAVENOUS | Status: AC
  Administered 2019-07-12: 12.5 g via INTRAVENOUS
  Filled 2019-07-12: qty 500

## 2019-07-12 MED ORDER — SODIUM CHLORIDE 0.9% FLUSH
3.0000 mL | Freq: Two times a day (BID) | INTRAVENOUS | Status: DC
  Administered 2019-07-13 – 2019-07-20 (×14): 3 mL via INTRAVENOUS

## 2019-07-12 MED ORDER — FAMOTIDINE IN NACL 20-0.9 MG/50ML-% IV SOLN
20.0000 mg | Freq: Two times a day (BID) | INTRAVENOUS | Status: AC
  Administered 2019-07-12: 20 mg via INTRAVENOUS
  Filled 2019-07-12: qty 50

## 2019-07-12 MED ORDER — ONDANSETRON HCL 4 MG/2ML IJ SOLN
4.0000 mg | Freq: Four times a day (QID) | INTRAMUSCULAR | Status: DC | PRN
  Administered 2019-07-13 – 2019-07-20 (×6): 4 mg via INTRAVENOUS
  Filled 2019-07-12 (×7): qty 2

## 2019-07-12 MED ORDER — METOPROLOL TARTRATE 12.5 MG HALF TABLET: 12.5000 mg | ORAL_TABLET | Freq: Two times a day (BID) | ORAL | Status: DC

## 2019-07-12 MED ORDER — ONDANSETRON HCL 4 MG/2ML IJ SOLN
INTRAMUSCULAR | Status: AC
  Filled 2019-07-12: qty 2

## 2019-07-12 MED ORDER — ACETAMINOPHEN 650 MG RE SUPP: 650.0000 mg | Freq: Once | RECTAL | Status: AC

## 2019-07-12 MED ORDER — CHLORHEXIDINE GLUCONATE CLOTH 2 % EX PADS
6.0000 | MEDICATED_PAD | Freq: Every day | CUTANEOUS | Status: DC
  Administered 2019-07-13 – 2019-07-18 (×6): 6 via TOPICAL

## 2019-07-12 MED ORDER — PROPOFOL 10 MG/ML IV BOLUS
INTRAVENOUS | Status: AC
  Filled 2019-07-12: qty 20

## 2019-07-12 MED ORDER — NOREPINEPHRINE 4 MG/250ML-% IV SOLN
0.0000 ug/min | INTRAVENOUS | Status: DC
  Administered 2019-07-13: 6 ug/min via INTRAVENOUS
  Administered 2019-07-13: 5.333 ug/min via INTRAVENOUS
  Filled 2019-07-12 (×2): qty 250

## 2019-07-12 MED ORDER — SODIUM CHLORIDE 0.45 % IV SOLN: INTRAVENOUS | Status: DC | PRN

## 2019-07-12 MED ORDER — VANCOMYCIN HCL IN DEXTROSE 1-5 GM/200ML-% IV SOLN
1000.0000 mg | Freq: Once | INTRAVENOUS | Status: AC
  Administered 2019-07-13: 1000 mg via INTRAVENOUS
  Filled 2019-07-12: qty 200

## 2019-07-12 MED ORDER — CHLORHEXIDINE GLUCONATE 4 % EX LIQD: 30.0000 mL | CUTANEOUS | Status: DC

## 2019-07-12 MED ORDER — ALBUMIN HUMAN 5 % IV SOLN: INTRAVENOUS | Status: DC | PRN

## 2019-07-12 MED ORDER — HEPARIN SODIUM (PORCINE) 1000 UNIT/ML IJ SOLN
INTRAMUSCULAR | Status: AC
  Filled 2019-07-12: qty 2

## 2019-07-12 MED ORDER — NITROGLYCERIN IN D5W 200-5 MCG/ML-% IV SOLN: 0.0000 ug/min | INTRAVENOUS | Status: DC

## 2019-07-12 MED ORDER — PANTOPRAZOLE SODIUM 40 MG PO TBEC
40.0000 mg | DELAYED_RELEASE_TABLET | Freq: Every day | ORAL | Status: DC
  Administered 2019-07-14 – 2019-07-20 (×7): 40 mg via ORAL
  Filled 2019-07-12 (×7): qty 1

## 2019-07-12 SURGICAL SUPPLY — 99 items
ADAPTER CARDIO PERF ANTE/RETRO (ADAPTER) ×5 IMPLANT
BAG DECANTER FOR FLEXI CONT (MISCELLANEOUS) ×5 IMPLANT
BASKET HEART  (ORDER IN 25'S) (MISCELLANEOUS) ×1
BASKET HEART (ORDER IN 25'S) (MISCELLANEOUS) ×1
BASKET HEART (ORDER IN 25S) (MISCELLANEOUS) ×3 IMPLANT
BLADE CLIPPER SURG (BLADE) ×5 IMPLANT
BLADE NDL 3 SS STRL (BLADE) IMPLANT
BLADE NEEDLE 3 SS STRL (BLADE) ×8 IMPLANT
BLADE NEEDLE 3MM SS STRL (BLADE) ×2
BLADE STERNUM SYSTEM 6 (BLADE) ×5 IMPLANT
BLADE SURG 11 STRL SS (BLADE) ×2 IMPLANT
BLADE SURG 12 STRL SS (BLADE) ×5 IMPLANT
BNDG ELASTIC 4X5.8 VLCR STR LF (GAUZE/BANDAGES/DRESSINGS) ×7 IMPLANT
BNDG ELASTIC 6X15 VLCR STRL LF (GAUZE/BANDAGES/DRESSINGS) ×4 IMPLANT
BNDG ELASTIC 6X5.8 VLCR STR LF (GAUZE/BANDAGES/DRESSINGS) ×5 IMPLANT
BNDG GAUZE ELAST 4 BULKY (GAUZE/BANDAGES/DRESSINGS) ×7 IMPLANT
CANISTER SUCT 3000ML PPV (MISCELLANEOUS) ×5 IMPLANT
CANNULA GUNDRY RCSP 15FR (MISCELLANEOUS) ×5 IMPLANT
CANNULA VESSEL 3MM BLUNT TIP (CANNULA) ×2 IMPLANT
CATH CPB KIT VANTRIGT (MISCELLANEOUS) ×5 IMPLANT
CATH ROBINSON RED A/P 18FR (CATHETERS) ×15 IMPLANT
CATH THORACIC 28FR RT ANG (CATHETERS) ×2 IMPLANT
CLIP FOGARTY SPRING 6M (CLIP) ×2 IMPLANT
CLIP RETRACTION 3.0MM CORONARY (MISCELLANEOUS) ×2 IMPLANT
CLIP VESOCCLUDE SM WIDE 24/CT (CLIP) ×2 IMPLANT
DERMABOND ADVANCED (GAUZE/BANDAGES/DRESSINGS) ×4
DERMABOND ADVANCED .7 DNX12 (GAUZE/BANDAGES/DRESSINGS) IMPLANT
DRAIN CHANNEL 32F RND 10.7 FF (WOUND CARE) ×5 IMPLANT
DRAPE CARDIOVASCULAR INCISE (DRAPES) ×2
DRAPE SLUSH/WARMER DISC (DRAPES) ×5 IMPLANT
DRAPE SRG 135X102X78XABS (DRAPES) ×3 IMPLANT
DRSG AQUACEL AG ADV 3.5X14 (GAUZE/BANDAGES/DRESSINGS) ×5 IMPLANT
ELECT BLADE 4.0 EZ CLEAN MEGAD (MISCELLANEOUS) ×5
ELECT BLADE 6.5 EXT (BLADE) ×5 IMPLANT
ELECT CAUTERY BLADE 6.4 (BLADE) ×5 IMPLANT
ELECT REM PT RETURN 9FT ADLT (ELECTROSURGICAL) ×10
ELECTRODE BLDE 4.0 EZ CLN MEGD (MISCELLANEOUS) ×3 IMPLANT
ELECTRODE REM PT RTRN 9FT ADLT (ELECTROSURGICAL) ×6 IMPLANT
FELT TEFLON 1X6 (MISCELLANEOUS) ×10 IMPLANT
GAUZE SPONGE 4X4 12PLY STRL (GAUZE/BANDAGES/DRESSINGS) ×12 IMPLANT
GLOVE BIO SURGEON STRL SZ 6.5 (GLOVE) ×4 IMPLANT
GLOVE BIO SURGEON STRL SZ7.5 (GLOVE) ×21 IMPLANT
GLOVE BIO SURGEONS STRL SZ 6.5 (GLOVE) ×4
GLOVE BIOGEL PI IND STRL 6 (GLOVE) IMPLANT
GLOVE BIOGEL PI INDICATOR 6 (GLOVE) ×6
GLOVE SURG SS PI 7.5 STRL IVOR (GLOVE) ×4 IMPLANT
GOWN STRL REUS W/ TWL LRG LVL3 (GOWN DISPOSABLE) ×12 IMPLANT
GOWN STRL REUS W/TWL LRG LVL3 (GOWN DISPOSABLE) ×14
HEMOSTAT POWDER SURGIFOAM 1G (HEMOSTASIS) ×15 IMPLANT
HEMOSTAT SURGICEL 2X14 (HEMOSTASIS) ×5 IMPLANT
INSERT FOGARTY XLG (MISCELLANEOUS) ×2 IMPLANT
KIT BASIN OR (CUSTOM PROCEDURE TRAY) ×5 IMPLANT
KIT SUCTION CATH 14FR (SUCTIONS) ×5 IMPLANT
KIT TURNOVER KIT B (KITS) ×5 IMPLANT
KIT VASOVIEW HEMOPRO 2 VH 4000 (KITS) ×5 IMPLANT
LEAD PACING MYOCARDI (MISCELLANEOUS) ×5 IMPLANT
MARKER GRAFT CORONARY BYPASS (MISCELLANEOUS) ×15 IMPLANT
NS IRRIG 1000ML POUR BTL (IV SOLUTION) ×25 IMPLANT
PACK E OPEN HEART (SUTURE) ×5 IMPLANT
PACK OPEN HEART (CUSTOM PROCEDURE TRAY) ×5 IMPLANT
PAD ELECT DEFIB RADIOL ZOLL (MISCELLANEOUS) ×5 IMPLANT
PENCIL BUTTON HOLSTER BLD 10FT (ELECTRODE) ×5 IMPLANT
POSITIONER HEAD DONUT 9IN (MISCELLANEOUS) ×5 IMPLANT
PUNCH AORTIC ROT 4.0MM RCL 40 (MISCELLANEOUS) ×2 IMPLANT
PUNCH AORTIC ROTATE  4.5MM 8IN (MISCELLANEOUS) ×2 IMPLANT
PUNCH AORTIC ROTATE 4.5MM 8IN (MISCELLANEOUS) ×2 IMPLANT
SET CARDIOPLEGIA MPS 5001102 (MISCELLANEOUS) ×2 IMPLANT
SPONGE LAP 18X18 X RAY DECT (DISPOSABLE) ×8 IMPLANT
SURGIFLO W/THROMBIN 8M KIT (HEMOSTASIS) ×5 IMPLANT
SUT BONE WAX W31G (SUTURE) ×5 IMPLANT
SUT PROLENE 3 0 SH DA (SUTURE) ×8 IMPLANT
SUT PROLENE 4 0 RB 1 (SUTURE) ×2
SUT PROLENE 4 0 SH DA (SUTURE) ×5 IMPLANT
SUT PROLENE 4-0 RB1 .5 CRCL 36 (SUTURE) ×3 IMPLANT
SUT PROLENE 6 0 C 1 30 (SUTURE) ×6 IMPLANT
SUT PROLENE 6 0 CC (SUTURE) ×17 IMPLANT
SUT PROLENE 8 0 BV175 6 (SUTURE) ×2 IMPLANT
SUT PROLENE BLUE 7 0 (SUTURE) ×7 IMPLANT
SUT SILK  1 MH (SUTURE) ×4
SUT SILK 1 MH (SUTURE) IMPLANT
SUT SILK 2 0 SH CR/8 (SUTURE) ×2 IMPLANT
SUT SILK 3 0 SH CR/8 (SUTURE) ×2 IMPLANT
SUT STEEL 6MS V (SUTURE) ×10 IMPLANT
SUT STEEL SZ 6 DBL 3X14 BALL (SUTURE) ×9 IMPLANT
SUT VIC AB 1 CTX 36 (SUTURE) ×12
SUT VIC AB 1 CTX36XBRD ANBCTR (SUTURE) ×6 IMPLANT
SUT VIC AB 2-0 CT1 27 (SUTURE) ×4
SUT VIC AB 2-0 CT1 TAPERPNT 27 (SUTURE) IMPLANT
SUT VIC AB 3-0 X1 27 (SUTURE) ×4 IMPLANT
SYR 3ML LL SCALE MARK (SYRINGE) ×2 IMPLANT
SYSTEM SAHARA CHEST DRAIN ATS (WOUND CARE) ×5 IMPLANT
TAPE CLOTH SOFT 2X10 (GAUZE/BANDAGES/DRESSINGS) ×2 IMPLANT
TAPE CLOTH SURG 4X10 WHT LF (GAUZE/BANDAGES/DRESSINGS) ×2 IMPLANT
TOWEL GREEN STERILE (TOWEL DISPOSABLE) ×5 IMPLANT
TOWEL GREEN STERILE FF (TOWEL DISPOSABLE) ×5 IMPLANT
TRAY FOLEY SLVR 16FR TEMP STAT (SET/KITS/TRAYS/PACK) ×5 IMPLANT
TUBING LAP HI FLOW INSUFFLATIO (TUBING) ×5 IMPLANT
UNDERPAD 30X30 (UNDERPADS AND DIAPERS) ×5 IMPLANT
WATER STERILE IRR 1000ML POUR (IV SOLUTION) ×10 IMPLANT

## 2019-07-12 NOTE — Progress Notes (Signed)
Started on [facility] step 1

## 2019-07-12 NOTE — Anesthesia Procedure Notes (Signed)
Arterial Line Insertion Start/End3/06/2019 7:30 AM Performed by: Laretta Alstrom, CRNA, CRNA  Patient location: Pre-op. Preanesthetic checklist: patient identified, IV checked, site marked, risks and benefits discussed, surgical consent, monitors and equipment checked, pre-op evaluation, timeout performed and anesthesia consent Lidocaine 1% used for infiltration radial was placed Catheter size: 20 G Hand hygiene performed  and maximum sterile barriers used   Attempts: 1 Procedure performed without using ultrasound guided technique. Following insertion, dressing applied and Biopatch. Post procedure assessment: normal and unchanged  Patient tolerated the procedure well with no immediate complications.

## 2019-07-12 NOTE — Transfer of Care (Signed)
Immediate Anesthesia Transfer of Care Note  Patient: Clinton Ortiz  Procedure(s) Performed: CORONARY ARTERY BYPASS GRAFTING (CABG), ON PUMP, TIMES FOUR , USING LEFT INTERNAL MAMMARY ARTERY AND ENDOSCOPICALLY HARVESTED BILATERAL GREATER SAPHENOUS VEINS (N/A Chest) TRANSESOPHAGEAL ECHOCARDIOGRAM (TEE) (N/A ) Insertion Central Line Adult (Right Neck)  Patient Location: SICU  Anesthesia Type:General  Level of Consciousness: sedated and Patient remains intubated per anesthesia plan  Airway & Oxygen Therapy: Patient remains intubated per anesthesia plan and Patient placed on Ventilator (see vital sign flow sheet for setting)  Post-op Assessment: Report given to RN and Post -op Vital signs reviewed and stable  Post vital signs: Reviewed and stable  Last Vitals:  Vitals Value Taken Time  BP 105/72 07/12/19 1600  Temp 35.9 C 07/12/19 1609  Pulse 81 07/12/19 1609  Resp 14 07/12/19 1609  SpO2 97 % 07/12/19 1609  Vitals shown include unvalidated device data.  Last Pain:  Vitals:   07/12/19 0624  TempSrc:   PainSc: 0-No pain      Patients Stated Pain Goal: 3 (93/11/21 6244)  Complications: No apparent anesthesia complications

## 2019-07-12 NOTE — H&P (Signed)
PCP is Silverio Decamp, MD Referring Provider is No ref. provider found  No chief complaint on file.  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  His LV systolic function is preserved.  LVEDP is normal.  Echocardiogram shows LVH from hypertension and no significant valvular disease.  Patient is in a sinus rhythm.  The patient's medical history is significant for history of left nephrectomy for renal cell cancer 2013 without recurrence but with baseline creatinine of 2.4.  He has COPD on inhalers at home but no sign.  Ificant smoking history.  He is obese with a BMI > 35.  I had the patient walk 200 feet in the office and his oxygen saturation on room air at rest was 96% and 95% after exercise. Past Medical History:  Diagnosis Date  . Arthritis   . CAD (coronary artery disease)    07/01/2019: LHC with LM disease. Needs CABG.  . Cancer [facility]) 2013   left kidney  . Complication of anesthesia    slow to wake up after kidney stone surgery  . Constipation, chronic   . COPD (chronic obstructive pulmonary disease) ([place])   . Diabetes mellitus ([place])   . Dyspnea    with exertion  . Family history of adverse reaction to anesthesia    daughter couldn't talk for 3-4 days after anesthesia   . Frequent urination   . GERD (gastroesophageal reflux disease)   . Headache   . Hernia   . History of kidney stones   . Hyperlipidemia   . Hypertension   . Peripheral vascular disease ([place])    right leg blood clot 20 plus years ago  . Pneumonia   . Renal insufficiency    stage 2 (only 1 kidney) had Kidney cancer  . Spondylolysis   . Tongue cancer ([place]) 2011     Past Surgical History:  Procedure Laterality Date  . COLONOSCOPY    . HERNIA REPAIR     abdominal  . KIDNEY STONE SURGERY    . LEFT HEART CATH AND CORONARY ANGIOGRAPHY N/A 07/01/2019   Procedure: LEFT HEART CATH AND CORONARY ANGIOGRAPHY;  Surgeon: Belva Crome, MD;  Location: [facility] LAB;  Service: Cardiovascular;  Laterality: N/A;  . NEPHRECTOMY Left 10/08/2011   s/p robotic assisted lap left radical nephrectomy 10/08/11  . PARTIAL GLOSSECTOMY  12/07/2009   s/p partial glossectomy for SCC right tongue 12/07/09    Family History  Problem Relation Age of Onset  . Cancer Mother        bladder  . Heart attack Father     Social History Social History   Tobacco Use  . Smoking status: Former Smoker    Packs/day: 1.00    Years: 2.00    Pack years: 2.00    Types: Cigarettes  . Smokeless tobacco: Never Used  . Tobacco comment: smoked 3 years as a teenager  Substance Use Topics  . Alcohol use: No  . Drug use: No    Current Facility-Administered Medications  Medication Dose Route Frequency Provider Last Rate Last Admin  . cefUROXime (ZINACEF) 1.5 g in sodium chloride 0.9 % 100 mL IVPB  1.5 g Intravenous To OR Prescott Gum, Collier Salina, MD      . cefUROXime [facility])  750 mg in sodium chloride 0.9 % 100 mL IVPB  750 mg Intravenous To OR Prescott Gum, Collier Salina, MD      . chlorhexidine (HIBICLENS) 4 % liquid 2 application  30 mL Topical UD Prescott Gum, Collier Salina, MD      . dexmedetomidine (PRECEDEX) 400 MCG/100ML (4 mcg/mL) infusion  0.1-0.7 mcg/kg/hr Intravenous To OR Prescott Gum, Collier Salina, MD      . EPINEPHrine (ADRENALIN) 4 mg in NS 250 mL (0.016 mg/mL) premix infusion  0-10 mcg/min Intravenous To OR Prescott Gum, Collier Salina, MD      . heparin 2,500 Units, papaverine 30 mg in electrolyte-148 (PLASMALYTE-148) 500 mL irrigation   Irrigation To OR Prescott Gum, Collier Salina, MD      . heparin 30,000 units/NS 1000 mL solution for CELLSAVER   Other To OR Prescott Gum, Collier Salina, MD      . insulin regular, human (MYXREDLIN)  100 units/ 100 mL infusion   Intravenous To OR Prescott Gum, Collier Salina, MD      . magnesium sulfate (IV Push/IM) injection 40 mEq  40 mEq Other To OR Prescott Gum, Collier Salina, MD      . metoprolol tartrate (LOPRESSOR) tablet 12.5 mg  12.5 mg Oral Once Prescott Gum, Collier Salina, MD      . milrinone (PRIMACOR) 20 MG/100 ML (0.2 mg/mL) infusion  0.3 mcg/kg/min Intravenous To OR Prescott Gum, Collier Salina, MD      . nitroGLYCERIN 50 mg in dextrose 5 % 250 mL (0.2 mg/mL) infusion  2-200 mcg/min Intravenous To OR Prescott Gum, Collier Salina, MD      . norepinephrine (LEVOPHED) 4mg  in 269mL premix infusion  0-40 mcg/min Intravenous To OR Prescott Gum, Collier Salina, MD      . phenylephrine (NEOSYNEPHRINE) 20-0.9 MG/250ML-% infusion  30-200 mcg/min Intravenous To OR Prescott Gum, Collier Salina, MD      . potassium chloride injection 80 mEq  80 mEq Other To OR Prescott Gum, Collier Salina, MD      . tranexamic acid (CYKLOKAPRON) 2,500 mg in sodium chloride 0.9 % 250 mL (10 mg/mL) infusion  1.5 mg/kg/hr Intravenous To OR Prescott Gum, Collier Salina, MD      . tranexamic acid (CYKLOKAPRON) bolus via infusion - over 30 minutes 1,755 mg  15 mg/kg Intravenous To OR Prescott Gum, Collier Salina, MD      . tranexamic acid (CYKLOKAPRON) pump prime solution 234 mg  2 mg/kg Intracatheter To OR Prescott Gum, Collier Salina, MD      . vancomycin (VANCOREADY) IVPB 1500 mg/300 mL  1,500 mg Intravenous To OR Ivin Poot, MD        Allergies  Allergen Reactions  . Flavoring Agent Other (See Comments)    makes him feel funny    Review of Systems   The patient takes his Trulicity injection for diabetes once a week on Tuesdays No history of stroke No active dental complaints or difficulty swallowing No problems with anesthesia when he had his nephrectomy No history of bleeding diathesis or previous blood transfusion He is right-hand dominant No previous strokes seizures or syncope No GI symptoms with normal colonoscopy within the past 5 years Denies alcohol intake, no history of jaundice or ulcer disease Positive  history for kidney stones and pyelonephritis treated with with IV antibiotics 2018  BP (!) 150/89   Pulse 81   Temp 98.4 F (36.9 C) (Oral)   Resp 20   Ht 5\' 7"  (1.702 m)   Wt 115.7 kg   SpO2 95%   BMI 39.94 kg/m  Physical Exam  Physical Exam  General: Obese 79 year old male accompanied by daughter no acute distress HEENT: Normocephalic pupils equal , dentition adequate Neck: Supple without JVD, adenopathy, or bruit Chest: Clear to auscultation, symmetrical breath sounds, no rhonchi, no tenderness             or deformity Cardiovascular: Regular rate and rhythm, no murmur, no gallop, peripheral pulses             palpable in all extremities Abdomen:  Soft, nontender, no palpable mass or organomegaly Extremities: Warm, well-perfused, no clubbing cyanosis edema or tenderness,              no venous stasis changes of the legs.  Poor left ulnar pulse not palpable Rectal/GU: Deferred Neuro: Grossly non--focal and symmetrical throughout Skin: Clean and dry without rash or ulceration   Diagnostic Tests: 80% left main stenosis with 90% ostial circumflex stenosis and 45% ostial RCA stenosis  Normal LV systolic function on echocardiogram EF 60%  Impression: Symptomatic severe multivessel coronary disease with preserved LV systolic function.  Class IV angina Type 2 diabetes mellitus Obesity Chronic renal failure from previous left nephrectomy Baseline creatinine 2.4   Plan: Patient would benefit from multivessel CABG but at increased risk due to his comorbid medical problems as noted above.  We will check his creatinine 6 days now after cardiac catheterization to make sure his renal function has recovered.  Surgery will be tentatively scheduled for Tuesday, March 2 at [facility] bypass grafts planned to the LAD ramus OM and possibly RCA.  Patient understands the benefits and potential risks of surgery and agrees to proceed.   Len Childs, MD [facility] [phone]  PREOP UPDATE  covid test neg, preop labs reviewed No symptoms of fever cough new pattern SOB myalgia   Mohamedamin Nifong has been scheduled for Procedure(s): CORONARY ARTERY BYPASS GRAFTING (CABG) (N/A) TRANSESOPHAGEAL ECHOCARDIOGRAM (TEE) (N/A) today. The various methods of treatment have been discussed with the patient. After consideration of the risks, benefits and treatment options the patient has consented to the planned procedure.   The patient has been seen and labs reviewed. There are no changes in the patient's condition to prevent proceeding with the planned procedure today.  Recent labs:  Lab Results  Component Value Date   WBC 7.5 07/08/2019   HGB 15.5 07/08/2019   HCT 47.2 07/08/2019   PLT 171 07/08/2019   GLUCOSE 131 (H) 07/08/2019   CHOL 146 07/01/2019   TRIG 270 (H) 07/01/2019   HDL 24 (L) 07/01/2019   LDLCALC 68 07/01/2019   ALT 40 07/08/2019   AST 32 07/08/2019   NA 141 07/08/2019   K 4.3 07/08/2019   CL 107 07/08/2019   CREATININE 2.50 (H) 07/08/2019   BUN 32 (H) 07/08/2019   CO2 23 07/08/2019   TSH 4.72 (H) 10/19/2018   INR 1.0 07/08/2019   HGBA1C 9.7 (H) 07/08/2019    Len Childs, MD 07/12/2019 7:25 AM

## 2019-07-12 NOTE — Brief Op Note (Signed)
07/12/2019  2:47 PM  PATIENT:  Clinton Ortiz  79 y.o. male  PRE-OPERATIVE DIAGNOSIS:  Coronary Artery Disease  POST-OPERATIVE DIAGNOSIS:  Coronary Artery Disease  PROCEDURE:  Procedure(s): CORONARY ARTERY BYPASS GRAFTING (CABG), ON PUMP, TIMES FOUR , USING LEFT INTERNAL MAMMARY ARTERY AND ENDOSCOPICALLY HARVESTED BILATERAL GREATER SAPHENOUS VEINS (N/A) TRANSESOPHAGEAL ECHOCARDIOGRAM (TEE) (N/A) Insertion Central Line Adult (Right)  LIMA->LAD SVG->D1 SVG->[place]amus SVG->PDA  SURGEON:  Surgeon(s) and Role:    Ivin Poot, MD - Primary  PHYSICIAN ASSISTANT: Michael Litter  ASSISTANTS: Coralyn Mark  ANESTHESIA:   general  BLOOD ADMINISTERED:Per anesthesia record  DRAINS: Mediastinal, left pleural  LOCAL MEDICATIONS USED:  NONE  SPECIMEN:  No Specimen  DISPOSITION OF SPECIMEN:  N/A  COUNTS:  YES  DICTATION: .Dragon Dictation  PLAN OF CARE: Admit to inpatient   PATIENT DISPOSITION:  ICU - intubated and hemodynamically stable.   Delay start of Pharmacological VTE agent (>24hrs) due to surgical blood loss or risk of bleeding: yes

## 2019-07-12 NOTE — Anesthesia Procedure Notes (Signed)
Central Venous Catheter Insertion Performed by: Effie Berkshire, MD, anesthesiologist Start/End3/06/2019 7:30 AM, 07/12/2019 7:35 AM Patient location: Pre-op. Preanesthetic checklist: patient identified, IV checked, site marked, risks and benefits discussed, surgical consent, monitors and equipment checked, pre-op evaluation, timeout performed and anesthesia consent Hand hygiene performed  and maximum sterile barriers used  PA cath was placed.Swan type:thermodilution Procedure performed without using ultrasound guided technique. Attempts: 1 Patient tolerated the procedure well with no immediate complications.

## 2019-07-12 NOTE — Progress Notes (Signed)
Called Prescott Gum MD to confirm extubation. Pt did reach weaning parameters per protocol. -20 on NIF, 850 vital capacity with lots of encouragement, last Crt 1.3, responds to commands but is very drowsy, opens eyes intermittently to touch. CI 2.2 MD orders to wait 2 more hours and wean again.

## 2019-07-12 NOTE — Progress Notes (Signed)
This note also relates to the following rows which could not be included: SpO2 - Cannot attach notes to unvalidated device data  Patient is now fully awake and choking on tube. Starting wean over at this time and starting at CPAP/PS due to how awake alert and ready patient is at this time.

## 2019-07-12 NOTE — Anesthesia Procedure Notes (Signed)
Central Venous Catheter Insertion Performed by: Effie Berkshire, MD, anesthesiologist Start/End3/06/2019 7:20 AM, 07/12/2019 7:30 AM Patient location: Pre-op. Preanesthetic checklist: patient identified, IV checked, site marked, risks and benefits discussed, surgical consent, monitors and equipment checked, pre-op evaluation, timeout performed and anesthesia consent Position: Trendelenburg Lidocaine 1% used for infiltration and patient sedated Hand hygiene performed , maximum sterile barriers used  and Seldinger technique used Catheter size: 9 Fr Total catheter length 10. Central line was placed.MAC introducer Swan type:thermodilution PA Cath depth:50 Procedure performed using ultrasound guided technique. Ultrasound Notes:anatomy identified, needle tip was noted to be adjacent to the nerve/plexus identified, no ultrasound evidence of intravascular and/or intraneural injection and image(s) printed for medical record Attempts: 1 Following insertion, line sutured and dressing applied. Post procedure assessment: blood return through all ports, free fluid flow and no air  Patient tolerated the procedure well with no immediate complications.

## 2019-07-12 NOTE — Progress Notes (Signed)
CT surgery p.m. Rounds   Status post CABG x4 with history of unstable angina Sedated on ventilator History of nephrectomy with baseline creatinine 2.2, urine output remains greater than 100 cc/h Minimal chest tube output Cardiac index 1.9, needs PAD 15-18 with history of LV hypertrophy

## 2019-07-12 NOTE — Telephone Encounter (Signed)
Patient is currently admitted

## 2019-07-12 NOTE — Progress Notes (Signed)
This note also relates to the following rows which could not be included: SpO2 - Cannot attach notes to unvalidated device data  Patient extubated per protocol

## 2019-07-12 NOTE — Procedures (Signed)
Extubation Procedure Note  Patient Details:   Name: Clinton Ortiz DOB: 12-03-1940 MRN: [id number]   Airway Documentation:    Vent end date: 07/12/19 Vent end time: 2253   Evaluation  O2 sats: stable throughout Complications: No apparent complications Patient did tolerate procedure well. Bilateral Breath Sounds: Clear, Diminished  Patient followed all testing with NIF-30 and VC 875 with good efforts. ABG wthin ranges for extubation and patient then extubated per protocol. Vent in room for now due to patients kidney history. Yes  Evonnie Dawes 07/12/2019, 10:57 PM

## 2019-07-12 NOTE — Progress Notes (Signed)
This note also relates to the following rows which could not be included: SpO2 - Cannot attach notes to unvalidated device data  MD notified and given results from patient weaning tests, wants to wait for 2 hours and attempt again due to patient just not being awake enough. Placed back on rest for now and will re attempt in 2 hours.

## 2019-07-12 NOTE — Progress Notes (Addendum)
Patient went out to eat last night after being tested for COVID.  Patient wore mask into restaurant and took mask off only to eat.   Spoke to Dr. Tobias Alexander about situation.  Dr. Tobias Alexander stated that we should wait to see what Dr. Smith Robert' recommendations are.  Will contact Dr. Smith Robert when he arrives this morning.   6:20 AM Dr. Prescott Gum Paged to make him aware of situation

## 2019-07-12 NOTE — Progress Notes (Signed)
Step 2 of Heart Wean protocol initiated and patient doing very well.

## 2019-07-12 NOTE — Anesthesia Procedure Notes (Signed)
Procedure Name: Intubation Date/Time: 07/12/2019 7:51 AM Performed by: Laretta Alstrom, CRNA Pre-anesthesia Checklist: Patient identified, Emergency Drugs available, Suction available and Patient being monitored Patient Re-evaluated:Patient Re-evaluated prior to induction Oxygen Delivery Method: Circle System Utilized Preoxygenation: Pre-oxygenation with 100% oxygen Induction Type: IV induction Ventilation: Mask ventilation without difficulty Grade View: Grade II Tube type: Oral Tube size: 7.5 mm Number of attempts: 1 Airway Equipment and Method: Stylet and Oral airway Placement Confirmation: ETT inserted through vocal cords under direct vision,  positive ETCO2 and breath sounds checked- equal and bilateral Secured at: 22 cm Tube secured with: Tape Dental Injury: Teeth and Oropharynx as per pre-operative assessment

## 2019-07-12 NOTE — Op Note (Signed)
Clinton Ortiz, Clinton Ortiz MEDICAL RECORD [id number] ACCOUNT [id number] DATE OF BIRTH:Feb 03, 1941 FACILITY: [facility] LOCATION: [facility] PHYSICIAN:Julieanne Hadsall VAN TRIGT III, MD  OPERATIVE REPORT  DATE OF PROCEDURE:  07/12/2019  OPERATION: 1.  Coronary artery bypass grafting x4 (left internal mammary artery to left anterior descending, saphenous vein graft to diagonal, saphenous vein graft to ramus intermediate, saphenous vein graft to right coronary artery). 2.  Endoscopic harvest of bilateral saphenous vein. 3.  Placement of right subclavian central line, double lumen.  PREOPERATIVE DIAGNOSES:   1.  Class 4 progressive angina.  2.  Severe 3-vessel coronary artery disease.  3.  Diabetes.  4.  Obesity.  5.  Chronic renal failure, status post nephrectomy.  POSTOPERATIVE DIAGNOSES:   1.  Class 4 progressive angina.  2.  Severe 3-vessel coronary artery disease.  3.  Diabetes.  4.  Obesity.  5.  Chronic renal failure, status post nephrectomy.  SURGEON:  Ivin Poot, MD  ASSISTANT:  Enid Cutter PA-C  ANESTHESIA:  General by Dr. Suella Broad.  CLINICAL NOTE:  The patient is a 79 year old obese diabetic male, status post nephrectomy with chronic renal insufficiency who presents with symptoms of unstable angina and a positive stress test.  He subsequently underwent cardiac catheterization as an  outpatient, which showed significant 3-vessel coronary artery disease and preserved LV function.  I evaluated the patient in the office in consultation for coronary bypass grafting which was recommended by his cardiologist.  I discussed the procedure,  including the indications, expected benefits, alternatives, and risks.  We discussed the location of the surgical incisions, the use of general anesthesia and cardiopulmonary bypass, and the expected postoperative hospital recovery.  We discussed the  potential risks of bleeding, stroke, organ failure, dialysis requirement due to his renal failure,  infection, bleeding, blood transfusion, and death.  He demonstrated his understanding and agreed to proceed with surgery under what I felt was an informed  consent.  OPERATIVE FINDINGS:  Poor quality vein.  Only 1 segment could be harvested from the right leg.  The left saphenous vein was good quality.  Mammary artery was small, but adequate flow.  The coronaries were small, diffusely diseased, but graftable.  The  distal circumflex was too small to graft.  The patient required 1 unit of packed cells for the operation.  DESCRIPTION OF PROCEDURE:  The patient was brought to the operating room from preoperative holding where informed consent was documented and final issues were addressed.  The patient was placed supine on the operating table where general anesthesia was  induced under invasive hemodynamic monitoring.  A transesophageal echo probe was placed by the anesthesia team.  The patient was prepped and draped as a sterile field.  A proper time-out was performed.  An incision was made through a sternotomy as the saphenous vein was harvested endoscopically from first the right leg and then the left leg.  The left internal mammary artery was harvested as a pedicle graft from its origin at the subclavian vessels.  The sternal retractor was placed using the deep blades because of the patient's obese body habitus.  The pericardium was opened and suspended.  The ascending aorta was without focal calcification.  Pursestrings were placed in the ascending aorta and  right atrium.  After the vein was harvested, heparin was administered and the patient was cannulated and placed on cardiopulmonary bypass.  The coronaries were identified for grafting.  The distal circumflex was too small to graft.  Cardioplegia cannulas were placed for both antegrade  and retrograde cold blood cardioplegia.  The patient was cooled to 32 degrees.  The aortic crossclamp was applied and 1 L of cold blood cardioplegia was delivered  in split doses between the antegrade  aortic and retrograde coronary sinus catheters.  There was good cardioplegic arrest and supple temperature dropped less than 12 degrees.  Cardioplegia was delivered every 20 minutes.  The distal coronary anastomoses were performed.  The first distal anastomosis was to the RCA.  This had an ostial 60% stenosis.  A reverse saphenous vein was small caliber was sewn end-to-side with running 7-0 Prolene with good flow through the graft.   Cardioplegia was redosed.  The second distal anastomosis was the ramus intermediate branch of left coronary.  This had a proximal 80% left main stenosis.  A reverse saphenous vein was sewn end-to-side with running 7-0 Prolene with good flow through the graft.  Cardioplegia was  redosed.  The third distal anastomosis was to the diagonal branch of the LAD.  The diagonal branch had a proximal left main stenosis, also proximal stenosis in the LAD.  It was sewn end-to-side to a 1.5 mm vessel with good flow through the graft.  Cardioplegia was  redosed.  The fourth distal anastomosis was the distal third of the LAD.  It was a small 1.5 mm vessel with a proximal left main stenosis.  The left IMA pedicle was brought through an opening in the left lateral pericardium and was brought down onto the LAD and  sewn end-to-side with running 8-0 Prolene.  There was good flow through the anastomosis after briefly releasing the pedicle bulldog on the mammary artery.  The bulldog was reapplied and the pedicle was secured to the epicardium with 6-0 Prolenes.   Cardioplegia was redosed.  While the crossclamp was still in place, 3 proximal vein anastomoses were performed on the ascending aorta using a 4.5 mm punch and running 6-0 Prolene.  Prior to tying down the final proximal anastomosis, air was vented from the coronaries with a dose  of retrograde warm blood cardioplegia.  The crossclamp was removed.  The vein grafts were deaired and opened.  Each  had good flow and hemostasis was documented at the proximal and distal anastomoses.  The patient was rewarmed and reperfused.  Temporary pacing wires were applied.  The lungs were expanded and the ventilator  was resumed.  When the patient was adequately rewarmed and reperfused, he was weaned from cardiopulmonary bypass on low-dose milrinone with stable hemodynamics.  LV function remained normal by echo.  Protamine was administered without adverse reaction.  The cannulas were removed.  The mediastinum was irrigated.  The superior pericardial fat was closed over the aorta.  Anterior mediastinal and left pleural chest tubes were placed and brought out  through separate incisions.  The patient remained hemodynamically stable, with adequate hemostasis.  The sternum was closed with wires.  The patient remained stable.  The pectoralis fascia was closed with a running #1 Vicryl.  Subcutaneous and skin layers were closed using running  Vicryl and sterile dressings were applied.  Under sterile field, the right subclavian vein was accessed using the Seldinger technique and a double lumen central line was placed for postoperative IV access since the patient has elevated creatinine levels chronically and peripherally inserted  central line catheters would not be an option because of risk of HD dependent  renal failure in the future.  Total cardiopulmonary bypass time was 138 minutes.  VN/NUANCE  D:07/12/2019 T:07/12/2019 JOB:[id number]

## 2019-07-13 ENCOUNTER — Ambulatory Visit: Payer: Medicare Other | Admitting: Sports Medicine

## 2019-07-13 ENCOUNTER — Inpatient Hospital Stay (HOSPITAL_COMMUNITY): Payer: Medicare Other

## 2019-07-13 ENCOUNTER — Encounter: Payer: Self-pay | Admitting: *Deleted

## 2019-07-13 LAB — POCT I-STAT 7, (LYTES, BLD GAS, ICA,H+H)
Acid-base deficit: 2 mmol/L (ref 0.0–2.0)
Acid-base deficit: 3 mmol/L — ABNORMAL HIGH (ref 0.0–2.0)
Acid-base deficit: 4 mmol/L — ABNORMAL HIGH (ref 0.0–2.0)
Acid-base deficit: 4 mmol/L — ABNORMAL HIGH (ref 0.0–2.0)
Bicarbonate: 21.4 mmol/L (ref 20.0–28.0)
Bicarbonate: 21.4 mmol/L (ref 20.0–28.0)
Bicarbonate: 21.8 mmol/L (ref 20.0–28.0)
Bicarbonate: 22.5 mmol/L (ref 20.0–28.0)
Calcium, Ion: 1.09 mmol/L — ABNORMAL LOW (ref 1.15–1.40)
Calcium, Ion: 1.1 mmol/L — ABNORMAL LOW (ref 1.15–1.40)
Calcium, Ion: 1.12 mmol/L — ABNORMAL LOW (ref 1.15–1.40)
Calcium, Ion: 1.12 mmol/L — ABNORMAL LOW (ref 1.15–1.40)
HCT: 29 % — ABNORMAL LOW (ref 39.0–52.0)
HCT: 29 % — ABNORMAL LOW (ref 39.0–52.0)
HCT: 29 % — ABNORMAL LOW (ref 39.0–52.0)
HCT: 29 % — ABNORMAL LOW (ref 39.0–52.0)
Hemoglobin: 9.9 g/dL — ABNORMAL LOW (ref 13.0–17.0)
Hemoglobin: 9.9 g/dL — ABNORMAL LOW (ref 13.0–17.0)
Hemoglobin: 9.9 g/dL — ABNORMAL LOW (ref 13.0–17.0)
Hemoglobin: 9.9 g/dL — ABNORMAL LOW (ref 13.0–17.0)
O2 Saturation: 92 %
O2 Saturation: 94 %
O2 Saturation: 96 %
O2 Saturation: 96 %
Potassium: 4.7 mmol/L (ref 3.5–5.1)
Potassium: 5 mmol/L (ref 3.5–5.1)
Potassium: 5 mmol/L (ref 3.5–5.1)
Potassium: 5.4 mmol/L — ABNORMAL HIGH (ref 3.5–5.1)
Sodium: 144 mmol/L (ref 135–145)
Sodium: 144 mmol/L (ref 135–145)
Sodium: 145 mmol/L (ref 135–145)
Sodium: 145 mmol/L (ref 135–145)
TCO2: 23 mmol/L (ref 22–32)
TCO2: 23 mmol/L (ref 22–32)
TCO2: 23 mmol/L (ref 22–32)
TCO2: 24 mmol/L (ref 22–32)
pCO2 arterial: 36.3 mmHg (ref 32.0–48.0)
pCO2 arterial: 37.5 mmHg (ref 32.0–48.0)
pCO2 arterial: 41.1 mmHg (ref 32.0–48.0)
pCO2 arterial: 41.4 mmHg (ref 32.0–48.0)
pH, Arterial: 7.322 — ABNORMAL LOW (ref 7.350–7.450)
pH, Arterial: 7.325 — ABNORMAL LOW (ref 7.350–7.450)
pH, Arterial: 7.373 (ref 7.350–7.450)
pH, Arterial: 7.4 (ref 7.350–7.450)
pO2, Arterial: 67 mmHg — ABNORMAL LOW (ref 83.0–108.0)
pO2, Arterial: 74 mmHg — ABNORMAL LOW (ref 83.0–108.0)
pO2, Arterial: 79 mmHg — ABNORMAL LOW (ref 83.0–108.0)
pO2, Arterial: 86 mmHg (ref 83.0–108.0)

## 2019-07-13 LAB — BASIC METABOLIC PANEL
Anion gap: 7 (ref 5–15)
Anion gap: 8 (ref 5–15)
BUN: 24 mg/dL — ABNORMAL HIGH (ref 8–23)
BUN: 24 mg/dL — ABNORMAL HIGH (ref 8–23)
CO2: 21 mmol/L — ABNORMAL LOW (ref 22–32)
CO2: 21 mmol/L — ABNORMAL LOW (ref 22–32)
Calcium: 7.5 mg/dL — ABNORMAL LOW (ref 8.9–10.3)
Calcium: 7.6 mg/dL — ABNORMAL LOW (ref 8.9–10.3)
Chloride: 113 mmol/L — ABNORMAL HIGH (ref 98–111)
Chloride: 113 mmol/L — ABNORMAL HIGH (ref 98–111)
Creatinine, Ser: 2.07 mg/dL — ABNORMAL HIGH (ref 0.61–1.24)
Creatinine, Ser: 2.24 mg/dL — ABNORMAL HIGH (ref 0.61–1.24)
GFR calc Af Amer: 35 mL/min — ABNORMAL LOW (ref >60)
GFR calc Af Amer: 32 mL/min — ABNORMAL LOW (ref >60)
GFR calc non Af Amer: 30 mL/min — ABNORMAL LOW (ref >60)
GFR calc non Af Amer: 27 mL/min — ABNORMAL LOW (ref >60)
Glucose, Bld: 171 mg/dL — ABNORMAL HIGH (ref 70–99)
Glucose, Bld: 177 mg/dL — ABNORMAL HIGH (ref 70–99)
Potassium: 4.9 mmol/L (ref 3.5–5.1)
Potassium: 4.4 mmol/L (ref 3.5–5.1)
Sodium: 141 mmol/L (ref 135–145)
Sodium: 142 mmol/L (ref 135–145)

## 2019-07-13 LAB — CBC
HCT: 32.1 % — ABNORMAL LOW (ref 39.0–52.0)
HCT: 30.2 % — ABNORMAL LOW (ref 39.0–52.0)
Hemoglobin: 10.6 g/dL — ABNORMAL LOW (ref 13.0–17.0)
Hemoglobin: 9.8 g/dL — ABNORMAL LOW (ref 13.0–17.0)
MCH: 32.4 pg (ref 26.0–34.0)
MCH: 32.3 pg (ref 26.0–34.0)
MCHC: 33 g/dL (ref 30.0–36.0)
MCHC: 32.5 g/dL (ref 30.0–36.0)
MCV: 98.2 fL (ref 80.0–100.0)
MCV: 99.7 fL (ref 80.0–100.0)
Platelets: 122 10*3/uL — ABNORMAL LOW (ref 150–400)
Platelets: UNDETERMINED 10*3/uL (ref 150–400)
RBC: 3.27 MIL/uL — ABNORMAL LOW (ref 4.22–5.81)
RBC: 3.03 MIL/uL — ABNORMAL LOW (ref 4.22–5.81)
RDW: 13.6 % (ref 11.5–15.5)
RDW: 13.7 % (ref 11.5–15.5)
WBC: 12.7 10*3/uL — ABNORMAL HIGH (ref 4.0–10.5)
WBC: 11.8 10*3/uL — ABNORMAL HIGH (ref 4.0–10.5)
nRBC: 0 % (ref 0.0–0.2)
nRBC: 0 % (ref 0.0–0.2)

## 2019-07-13 LAB — COOXEMETRY PANEL
Carboxyhemoglobin: 2.2 % — ABNORMAL HIGH (ref 0.5–1.5)
Carboxyhemoglobin: 1.3 % (ref 0.5–1.5)
Carboxyhemoglobin: 1.6 % — ABNORMAL HIGH (ref 0.5–1.5)
Methemoglobin: 1.2 % (ref 0.0–1.5)
Methemoglobin: 0.8 % (ref 0.0–1.5)
Methemoglobin: 1.3 % (ref 0.0–1.5)
O2 Saturation: 98.5 %
O2 Saturation: 69.9 %
O2 Saturation: 66.2 %
Total hemoglobin: 10.8 g/dL — ABNORMAL LOW (ref 12.0–16.0)
Total hemoglobin: 9.6 g/dL — ABNORMAL LOW (ref 12.0–16.0)
Total hemoglobin: 9.9 g/dL — ABNORMAL LOW (ref 12.0–16.0)

## 2019-07-13 LAB — GLUCOSE, CAPILLARY
Glucose-Capillary: 158 mg/dL — ABNORMAL HIGH (ref 70–99)
Glucose-Capillary: 167 mg/dL — ABNORMAL HIGH (ref 70–99)
Glucose-Capillary: 166 mg/dL — ABNORMAL HIGH (ref 70–99)
Glucose-Capillary: 155 mg/dL — ABNORMAL HIGH (ref 70–99)
Glucose-Capillary: 163 mg/dL — ABNORMAL HIGH (ref 70–99)
Glucose-Capillary: 160 mg/dL — ABNORMAL HIGH (ref 70–99)
Glucose-Capillary: 173 mg/dL — ABNORMAL HIGH (ref 70–99)
Glucose-Capillary: 157 mg/dL — ABNORMAL HIGH (ref 70–99)
Glucose-Capillary: 181 mg/dL — ABNORMAL HIGH (ref 70–99)
Glucose-Capillary: 157 mg/dL — ABNORMAL HIGH (ref 70–99)
Glucose-Capillary: 163 mg/dL — ABNORMAL HIGH (ref 70–99)
Glucose-Capillary: 193 mg/dL — ABNORMAL HIGH (ref 70–99)
Glucose-Capillary: 176 mg/dL — ABNORMAL HIGH (ref 70–99)
Glucose-Capillary: 165 mg/dL — ABNORMAL HIGH (ref 70–99)
Glucose-Capillary: 198 mg/dL — ABNORMAL HIGH (ref 70–99)

## 2019-07-13 LAB — MAGNESIUM
Magnesium: 1.6 mg/dL — ABNORMAL LOW (ref 1.7–2.4)
Magnesium: 1.8 mg/dL (ref 1.7–2.4)

## 2019-07-13 LAB — PREPARE RBC (CROSSMATCH)

## 2019-07-13 MED ORDER — ALBUMIN HUMAN 5 % IV SOLN
12.5000 g | Freq: Once | INTRAVENOUS | Status: AC
  Administered 2019-07-13: 12.5 g via INTRAVENOUS
  Filled 2019-07-13: qty 250

## 2019-07-13 MED ORDER — AMIODARONE LOAD VIA INFUSION
150.0000 mg | Freq: Once | INTRAVENOUS | Status: AC
  Administered 2019-07-13: 150 mg via INTRAVENOUS
  Filled 2019-07-13: qty 83.34

## 2019-07-13 MED ORDER — AMIODARONE HCL IN DEXTROSE 360-4.14 MG/200ML-% IV SOLN
60.0000 mg/h | INTRAVENOUS | Status: AC
  Administered 2019-07-13: 60 mg/h via INTRAVENOUS
  Filled 2019-07-13: qty 200

## 2019-07-13 MED ORDER — MORPHINE SULFATE (PF) 2 MG/ML IV SOLN
1.0000 mg | INTRAVENOUS | Status: DC | PRN
  Administered 2019-07-14 (×2): 2 mg via INTRAVENOUS
  Filled 2019-07-13 (×2): qty 1

## 2019-07-13 MED ORDER — INSULIN ASPART 100 UNIT/ML ~~LOC~~ SOLN
0.0000 [IU] | SUBCUTANEOUS | Status: DC
  Administered 2019-07-13 (×3): 4 [IU] via SUBCUTANEOUS
  Administered 2019-07-14 (×2): 8 [IU] via SUBCUTANEOUS
  Administered 2019-07-14: 4 [IU] via SUBCUTANEOUS

## 2019-07-13 MED ORDER — FUROSEMIDE 10 MG/ML IJ SOLN
80.0000 mg | Freq: Once | INTRAMUSCULAR | Status: AC
  Administered 2019-07-13: 80 mg via INTRAVENOUS
  Filled 2019-07-13: qty 8

## 2019-07-13 MED ORDER — ORAL CARE MOUTH RINSE
15.0000 mL | Freq: Two times a day (BID) | OROMUCOSAL | Status: DC
  Administered 2019-07-13 – 2019-07-19 (×11): 15 mL via OROMUCOSAL

## 2019-07-13 MED ORDER — MIDAZOLAM HCL 2 MG/2ML IJ SOLN: 1.0000 mg | Freq: Four times a day (QID) | INTRAMUSCULAR | Status: DC | PRN

## 2019-07-13 MED ORDER — AMIODARONE HCL IN DEXTROSE 360-4.14 MG/200ML-% IV SOLN
60.0000 mg/h | INTRAVENOUS | Status: DC
  Administered 2019-07-13: 60 mg/h via INTRAVENOUS
  Filled 2019-07-13 (×2): qty 200

## 2019-07-13 MED ORDER — AMIODARONE HCL IN DEXTROSE 360-4.14 MG/200ML-% IV SOLN
30.0000 mg/h | INTRAVENOUS | Status: DC
  Administered 2019-07-14 – 2019-07-15 (×3): 30 mg/h via INTRAVENOUS
  Filled 2019-07-13 (×2): qty 200

## 2019-07-13 MED ORDER — INSULIN ASPART 100 UNIT/ML ~~LOC~~ SOLN: 4.0000 [IU] | Freq: Three times a day (TID) | SUBCUTANEOUS | Status: DC

## 2019-07-13 MED ORDER — INSULIN DETEMIR 100 UNIT/ML ~~LOC~~ SOLN
20.0000 [IU] | Freq: Two times a day (BID) | SUBCUTANEOUS | Status: DC
  Administered 2019-07-13 (×2): 20 [IU] via SUBCUTANEOUS
  Filled 2019-07-13 (×4): qty 0.2

## 2019-07-13 MED ORDER — AMIODARONE HCL IN DEXTROSE 360-4.14 MG/200ML-% IV SOLN: 30.0000 mg/h | INTRAVENOUS | Status: DC

## 2019-07-13 NOTE — Progress Notes (Addendum)
Prescott Gum MD notified of decreased urine output (20-30/hr) the past couple of hours. Will give one bottle of albumin and then 80 mg lasix once. Also ordered to keep levophed gtt at 3.

## 2019-07-13 NOTE — Progress Notes (Signed)
Per Prescott Gum MD, if patient has not converted to NSR by 2000 give another 150 mg bolus of amiodarone.

## 2019-07-13 NOTE — Progress Notes (Signed)
Patient into a fib with RVR 130s-140s. Prescott Gum MD notified. Will load with 150 mg amiodarone and start the gtt at 60 for 6 hours and then 30 after that.

## 2019-07-13 NOTE — Progress Notes (Signed)
      [address]       [place] [address]             [phone]      Resting comfortably  BP 129/76   Pulse (!) 129   Temp 98.7 F (37.1 C) (Oral)   Resp (!) 25   Ht 5\' 7"  (1.702 m)   Wt 123 kg Comment: standing weight  SpO2 92%   BMI 42.47 kg/m  In AF with RVR- started on amiodarone   Intake/Output Summary (Last 24 hours) at 07/13/2019 1740 Last data filed at 07/13/2019 1700 Gross per 24 hour  Intake 2834.91 ml  Output 2680 ml  Net 154.91 ml   Creatinine up slightly to 2.2  Diuresing well  Remo Lipps C. Roxan Hockey, MD [facility] [phone]

## 2019-07-13 NOTE — Plan of Care (Signed)
Pt is alert, only oriented to self. Pt has been reoriented a few different times but has been forgetful. During first assessment post extubation, pt did answer questions appropriately and was oriented to person, time, place, and situation. But, around midnight, pt did start becoming more alert and moving all extremities but disoriented x 3. Pt does not pull on lines or attempts to get up. Pt is calm and cooperative. Breathing is even andu nlabored on 4L [place], pt's oxygen saturation maintains in the mid to upper 90s. No significant bleeding noted. Pt has had adequate urine output throughout the night. Aline is very positional and has been zeroed and flushed many times. Pt is very weak and his upper extremities are shaky. Call bell is within reach, bed alarm is on, and bed is in lowest position. Problem: Clinical Measurements: Goal: Postoperative complications will be avoided or minimized Outcome: Progressing   Problem: Respiratory: Goal: Respiratory status will improve Outcome: Progressing   Problem: Urinary Elimination: Goal: Ability to achieve and maintain adequate renal perfusion and functioning will improve Outcome: Progressing

## 2019-07-13 NOTE — Progress Notes (Signed)
Inpatient Diabetes Program Recommendations  AACE/ADA: New Consensus Statement on Inpatient Glycemic Control (2015)  Target Ranges:  Prepandial:   less than 140 mg/dL      Peak postprandial:   less than 180 mg/dL (1-2 hours)      Critically ill patients:  140 - 180 mg/dL   Lab Results  Component Value Date   GLUCAP 157 (H) 07/13/2019   HGBA1C 9.7 (H) 07/08/2019    Review of Glycemic Control Results for BEAUREGARD, JARRELLS (MRN [id number]) as of 07/13/2019 10:33  Ref. Range 07/13/2019 07:48 07/13/2019 08:35 07/13/2019 09:47  Glucose-Capillary Latest Ref Range: 70 - 99 mg/dL 181 (H) 173 (H) 157 (H)   Diabetes history: Type 2 DM Outpatient Diabetes medications: Trulicity 4.5 mg Qwk, Glucotrol 10 mg BID Current orders for Inpatient glycemic control: IV insulin to transition to: Levemir 20 units BID, Novolog 4 units TID, Novolog 0-24 units Q4H  Inpatient Diabetes Program Recommendations:    Consider discontinuing Novolog 4 units TID.  Secure chat sent to MD.   Thanks, Bronson Curb, MSN, RNC-OB Diabetes Coordinator [phone] (8a-5p)

## 2019-07-13 NOTE — Evaluation (Signed)
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 Pt now s/p CABG x4 on 07/12/2019.  Clinical Impression  Pt presents to PT with deficits in functional mobility, gait, balance, endurance, strength, power, cardiopulmonary function. Pt is able to transfer and ambulate for short distances with limited physical assistance, requiring education and cueing on hand placement during all mobility. Pt will benefit from aggressive mobilization to improve activity tolerance and functional mobility status.    Follow Up Recommendations Home health PT;Supervision/Assistance - 24 hour    Equipment Recommendations  None recommended by PT(pt owns RW and BSC already)    Recommendations for Other Services       Precautions / Restrictions Precautions Precautions: Fall;Sternal Precaution Booklet Issued: No Precaution Comments: verbally reviewed sternal and pacemaker precautions Restrictions Weight Bearing Restrictions: No Other Position/Activity Restrictions: sternal precautions      Mobility  Bed Mobility Overal bed mobility: (pt received and left sitting in recliner)                Transfers Overall transfer level: Needs assistance Equipment used: Rolling walker (2 wheeled) Transfers: Sit to/from Stand Sit to Stand: Min assist         General transfer comment: PT providing cues for sternal precautions. Pt completes 4 sit to stands during session  Ambulation/Gait Ambulation/Gait assistance: Min assist Gait Distance (Feet): 10 Feet Assistive device: Rolling walker (2 wheeled) Gait Pattern/deviations: Step-to pattern;Shuffle Gait velocity: reduced Gait  velocity interpretation: <1.31 ft/sec, indicative of household ambulator General Gait Details: pt with shuffling steps initially, progressing to step to gait with increased foot clearance and step length  Stairs            Wheelchair Mobility    Modified Rankin (Stroke Patients Only)       Balance Overall balance assessment: Needs assistance Sitting-balance support: No upper extremity supported;Feet supported Sitting balance-Leahy Scale: Fair Sitting balance - Comments: minG without UE support   Standing balance support: Bilateral upper extremity supported;During functional activity Standing balance-Leahy Scale: Fair Standing balance comment: minG with BUE support of RW                             Pertinent Vitals/Pain Pain Assessment: Faces Faces Pain Scale: Hurts even more Pain Location: incision and legs Pain Descriptors / Indicators: Grimacing Pain Intervention(s): Limited activity within patient's tolerance    Home Living Family/patient expects to be discharged to:: Private residence Living Arrangements: Children Available Help at Discharge: Family;Available 24 hours/day Type of Home: Mobile home Home Access: Ramped entrance     Home Layout: One level Home Equipment: [place] - 2 wheels;Wheelchair - power;Bedside commode;Shower seat      Prior Function Level of Independence: Independent               Hand Dominance        Extremity/Trunk Assessment   Upper Extremity Assessment Upper Extremity Assessment: Overall WFL for tasks assessed    Lower Extremity Assessment Lower Extremity Assessment: Generalized weakness    Cervical / Trunk Assessment Cervical / Trunk Assessment: Normal  Communication   Communication: No difficulties  Cognition Arousal/Alertness: Awake/alert Behavior During Therapy: WFL for tasks assessed/performed Overall Cognitive Status: Within Functional Limits  for tasks assessed                                         General Comments General comments (skin integrity, edema, etc.): pt on 3L [place], PT weans to room air with patient desat to 88% at end of session, placed back on 3L [place]    Exercises     Assessment/Plan    PT Assessment Patient needs continued PT services  PT Problem List Decreased strength;Decreased activity tolerance;Decreased balance;Decreased mobility;Decreased knowledge of use of DME;Decreased safety awareness;Decreased knowledge of precautions;Cardiopulmonary status limiting activity;Pain       PT Treatment Interventions DME instruction;Gait training;Functional mobility training;Therapeutic activities;Therapeutic exercise;Balance training;Neuromuscular re-education;Patient/family education    PT Goals (Current goals can be found in the Care Plan section)  Acute Rehab PT Goals Patient Stated Goal: To return to independent mobility PT Goal Formulation: With patient Time For Goal Achievement: 07/27/19 Potential to Achieve Goals: Good Additional Goals Additional Goal #1: Pt will maintain dynamic standing balance within 10 inches of his base of support without UE support with supervision    Frequency Min 3X/week   Barriers to discharge        Co-evaluation               AM-PAC PT "6 Clicks" Mobility  Outcome Measure Help needed turning from your back to your side while in a flat bed without using bedrails?: A Lot Help needed moving from lying on your back to sitting on the side of a flat bed without using bedrails?: A Lot Help needed moving to and from a bed to a chair (including a wheelchair)?: A Little Help needed standing up from a chair using your arms (e.g., wheelchair or bedside chair)?: A Little Help needed to walk in hospital room?: A Little Help needed climbing 3-5 steps with a railing? : A Lot 6 Click Score: 15    End of Session Equipment Utilized During Treatment: Oxygen Activity Tolerance: Patient tolerated treatment well Patient  left: in chair;with call bell/phone within reach Nurse Communication: Mobility status PT Visit Diagnosis: Muscle weakness (generalized) (M62.81);Other abnormalities of gait and mobility (R26.89)    Time: 0175-1025 PT Time Calculation (min) (ACUTE ONLY): 24 min   Charges:   PT Evaluation $PT Eval Moderate Complexity: 1 Mod PT Treatments $Therapeutic Activity: 8-22 mins        Zenaida Niece, PT, DPT Acute Rehabilitation Pager: [phone]   Zenaida Niece 07/13/2019, 1:05 PM

## 2019-07-13 NOTE — Progress Notes (Signed)
Per Prescott Gum MD, keep SBP greater than 110.

## 2019-07-13 NOTE — Discharge Summary (Addendum)
Physician Discharge Summary  Patient ID: Clinton Ortiz MRN: [id number] DOB/AGE: Jan 01, 1941 79 y.o.  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  His LV systolic function is preserved.  LVEDP is normal.  Echocardiogram shows LVH from hypertension and no significant valvular disease.  Patient is in a sinus rhythm.   The patient's medical history is significant for history of left nephrectomy for renal cell cancer 2013 without recurrence but with baseline creatinine of 2.4.  He has COPD on inhalers at home but no sign.  Ificant smoking history.  He is obese with a BMI > 35.   I had the patient walk 200 feet in the office and his oxygen saturation on room air at rest was 96% and 95% after exercise.  Hospital Course:    Coronary bypass grafting was offered to the patient and he elected to proceed with surgery.  He was taken to the operating room on 07/12/2019 where Dr. Benard Rink performed CABG x4.  Patient tolerated procedure well and separated from cardiopulmonary bypass without difficulty.  Following the procedure, he was transferred to the cardiovascular ICU in stable condition on milrinone and Neo-Synephrine.  He  remained hemodynamically stable.  He was weaned from the ventilator and extubated routinely in the evening on the day of surgery.  Vasopressor and inotropic support was gradually weaned.  Addendum:   His chest tubes were removed without difficulty.  He developed rapid Atrial Fibrillation.  He was treated with IV Amiodarone with successful conversion to NSR.  He suffered and AKI with creatinine level peaking at 2.5.  Once this plateaued, he was started on lasix and zaroxolyn for hypervolemia. We have arranged home nursing to check BMET's in the near future .  He was working with physical therapy.  He continued to maintain NSR and was stable for transfer to the progressive care unit on 07/16/2019.  The patient continued to make progress. His pacing wires have been removed without difficulty.  His mobility is improving, but PT felt continue home PT would be indicated.  Home health arrangements have been made.  He continues to ambulate with a walker.  He is tolerating a diet without difficulty. He did have some desaturation at night but this has improved over time and he does not qualify at this time for Home O2 at night.    His incisions are healing without evidence of infection. He is a poorly controlled diabetic and will need close follow up with his PMD for long term risk modification for which he is aware.  He is medically stable for discharge home today.  Significant Diagnostic Studies:   LEFT HEART CATH AND CORONARY ANGIOGRAPHY  Conclusion  Eccentric distal left main, 60 to 70%. Proximal 50 to 60% LAD stenosis. Widely patent ramus intermedius. 60 to 70% proximal circumflex. 40% ostial codominant RCA. Normal LVEDP.  Left ventriculography was not performed due  to CKD.   RECOMMENDATIONS:   Additional hydration today for at least 4 hours and potentially overnight. Basic metabolic panel should be done at [facility] on Monday. [facility] has been contacted about surgical consultation.  This could be done  as an outpatient as the patient is clinically stable. The patient's clinical team has been notified.   ECHOCARDIOGRAM REPORT         Patient Name:   Clinton Ortiz     Date of Exam: 05/31/2019  Medical Rec #:  [id number]     Height:       67.0 in  Accession #:    [id number]    Weight:       268.0 lb  Date of Birth:  August 25, 1940     BSA:          2.29 m  Patient Age:    55 years      BP:           133/69 mmHg  Patient Gender: M             HR:           71 bpm.  Exam Location:  [place]   Procedure: 2D Echo, Cardiac Doppler, Color Doppler and Intracardiac             Opacification Agent   Indications:    I50.9 CHF                  R07.9 Chest Pain     History:        Patient has no prior history of Echocardiogram  examinations.                  COPD, Signs/Symptoms:Shortness of Breath and Chest Pain;  Risk                  Factors:Hypertension, Dyslipidemia, Diabetes, Family  History of                  Coronary Artery Disease and Former Smoker. Edema, Obesity,  Left                  Renal Carcinoma status post Nephrectomy.     Sonographer:    Deliah Boston RDCS  Referring Phys: [place]     1. Left ventricular ejection fraction, by visual estimation, is 60 to  65%. The left ventricle has normal function. There is no left ventricular  hypertrophy.   2. Left ventricular diastolic parameters are consistent with Grade I  diastolic dysfunction (impaired relaxation).   3. The left ventricle has no regional wall motion abnormalities.   4. Global right ventricle has normal systolic function.The right  ventricular size is normal. No increase in right ventricular wall  thickness.   5. Left atrial size was normal.   6. Right atrial size was normal.   7. The mitral valve is grossly normal. No evidence of mitral valve  regurgitation.   8. The tricuspid valve is not well visualized.   9. The tricuspid valve is not well visualized. Tricuspid valve   regurgitation is not demonstrated.  10. The aortic valve is grossly normal. Aortic valve regurgitation is not  visualized. No evidence of aortic valve sclerosis or stenosis.  11. The pulmonic valve was normal in structure. Pulmonic valve  regurgitation is not visualized.  12. Aortic dilatation noted.  13. There is moderate dilatation of the ascending aorta measuring 41 mm.  14. The atrial septum is grossly normal.   FINDINGS   Left Ventricle: Left ventricular ejection fraction, by visual estimation,  is 60 to 65%. The left ventricle has normal function. The left ventricle  has no regional wall motion abnormalities. There is no left ventricular  hypertrophy. Left ventricular  diastolic parameters are consistent with Grade I diastolic dysfunction  (impaired relaxation).   Right Ventricle: The right ventricular size is normal. No increase in  right ventricular wall thickness. Global RV systolic function is has  normal systolic function.   Left Atrium: Left atrial size was normal in size.   Right Atrium: Right atrial size was normal in size   Pericardium: There is no evidence of pericardial effusion.   Mitral Valve: The mitral valve is grossly normal. No evidence of mitral  valve regurgitation.   Tricuspid Valve: The tricuspid valve is not well visualized. Tricuspid  valve regurgitation is not demonstrated.   Aortic Valve: The aortic valve is grossly normal. Aortic valve  regurgitation is not visualized. The aortic valve is structurally normal,  with no evidence of sclerosis or stenosis.   Pulmonic Valve: The pulmonic valve was normal in structure. Pulmonic valve  regurgitation is not visualized. Pulmonic regurgitation is not visualized.   Aorta: Aortic dilatation noted. There is moderate dilatation of the  ascending aorta measuring 41 mm.   IAS/Shunts: The atrial septum is grossly normal.      LEFT VENTRICLE  PLAX 2D  LVIDd:         4.09 cm  Diastology  LVIDs:          2.78 cm  LV e' lateral:   8.33 cm/s  LV PW:         1.37 cm  LV E/e' lateral: 8.4  LV IVS:        0.89 cm  LV e' medial:    7.51 cm/s  LVOT diam:     2.70 cm  LV E/e' medial:  9.3  LV SV:         45 ml  LV SV Index:   18.16  LVOT Area:     5.73 cm      RIGHT VENTRICLE  RV S prime:     14.80 cm/s  TAPSE (M-mode): 1.9 cm   LEFT ATRIUM             Index       RIGHT ATRIUM           Index  LA diam:        3.60 cm 1.57 cm/m  RA Area:     16.30 cm  LA Vol (A2C):   31.3 ml 13.67 ml/m RA Volume:   45.60 ml  19.91 ml/m  LA Vol (A4C):   79.9 ml 34.89 ml/m  LA Biplane Vol: 51.3 ml 22.40 ml/m   AORTIC VALVE  LVOT Vmax:   81.30 cm/s  LVOT Vmean:  52.400 cm/s  LVOT VTI:    0.204 m     AORTA  Ao Root diam: 4.50 cm  Ao Asc diam:  4.25 cm   MITRAL VALVE  MV Area (PHT): cm                  SHUNTS  MV PHT:        msec                 Systemic VTI:  0.20 m  MV Decel Time: 282 msec  Systemic Diam: 2.70 cm  MV E velocity: 70.05 cm/s 103 cm/s  MV A velocity: 97.75 cm/s 70.3 cm/s  MV E/A ratio:  0.72       1.5      Mertie Moores MD  Electronically signed by Mertie Moores MD  Signature Date/Time: 05/31/2019/4:20:28 PM     Treatments:   OPERATIVE REPORT   DATE OF PROCEDURE:  07/12/2019   OPERATION: 1.  Coronary artery bypass grafting x4 (left internal mammary artery to left anterior descending, saphenous vein graft to diagonal, saphenous vein graft to ramus intermediate, saphenous vein graft to right coronary artery). 2.  Endoscopic harvest of bilateral saphenous vein. 3.  Placement of right subclavian central line, double lumen.   PREOPERATIVE DIAGNOSES:   1.  Class 4 progressive angina.  2.  Severe 3-vessel coronary artery disease.  3.  Diabetes.  4.  Obesity.  5.  Chronic renal failure, status post nephrectomy.   POSTOPERATIVE DIAGNOSES:   1.  Class 4 progressive angina.  2.  Severe 3-vessel coronary artery disease.  3.  Diabetes.  4.  Obesity.  5.  Chronic  renal failure, status post nephrectomy.   SURGEON:  Ivin Poot, MD   ASSISTANT:  Enid Cutter PA-C   ANESTHESIA:  General by Dr. Suella Broad.   CLINICAL NOTE:  The patient is a 79 year old obese diabetic male, status post nephrectomy with chronic renal insufficiency who presents with symptoms of unstable angina and a positive stress test.  He subsequently underwent cardiac catheterization as an  outpatient, which showed significant 3-vessel coronary artery disease and preserved LV function.  I evaluated the patient in the office in consultation for coronary bypass grafting which was recommended by his cardiologist.  I discussed the procedure,  including the indications, expected benefits, alternatives, and risks.  We discussed the location of the surgical incisions, the use of general anesthesia and cardiopulmonary bypass, and the expected postoperative hospital recovery.  We discussed the  potential risks of bleeding, stroke, organ failure, dialysis requirement due to his renal failure, infection, bleeding, blood transfusion, and death.  He demonstrated his understanding and agreed to proceed with surgery under what I felt was an informed  consent.  Discharge Exam: Blood pressure 133/77, pulse 84, temperature 98.2 F (36.8 C), temperature source Oral, resp. rate 20, height 5\' 7"  (1.702 m), weight 114 kg, SpO2 96 %.   General appearance: alert, cooperative and no distress Heart: regular rate and rhythm Lungs: min dim in bases Abdomen: soft, obese, non-tender Extremities: + edema Wound: incis healing well  Discharge Medications:  Discharge Instructions     Discharge patient   Complete by: As directed    Discharge disposition: 01-Home or Self Care   Discharge patient date: 07/20/2019      Allergies as of 07/20/2019       Reactions   Flavoring Agent Other (See Comments)   makes him feel funny        Medication List     STOP taking these medications    aspirin  325 MG tablet Replaced by: aspirin 81 MG EC tablet   metoprolol succinate 25 MG 24 hr tablet Commonly known as: Toprol XL   nitroGLYCERIN 0.4 MG SL tablet Commonly known as: Nitrostat       TAKE these medications    acetaminophen 325 MG tablet Commonly known as: TYLENOL Take 650 mg by mouth every 6 (six) hours as needed for mild pain or headache.   Albuterol Sulfate 108 (90 Base) MCG/ACT  Aepb Commonly known as: ProAir RespiClick Inhale 2 puffs into the lungs every 4 (four) hours as needed. What changed: reasons to take this   amiodarone 200 MG tablet Commonly known as: PACERONE Take 1 tablet (200 mg total) by mouth 2 (two) times daily.   aspirin 81 MG EC tablet Take 1 tablet (81 mg total) by mouth daily. Replaces: aspirin 325 MG tablet   atorvastatin 80 MG tablet Commonly known as: LIPITOR Take 1 tablet (80 mg total) by mouth daily at 6 PM.   azelastine 0.1 % nasal spray Commonly known as: ASTELIN SPRAY 2 SPRAYS INTO EACH NOSTRIL TWICE A DAY AS DIRECTED What changed: See the new instructions.   B COMPLEX PO Take 1 tablet by mouth daily.   clopidogrel 75 MG tablet Commonly known as: PLAVIX Take 1 tablet (75 mg total) by mouth daily.   Dexcom G6 Receiver Devi 1 each by Does not apply route daily.   Dexcom G6 Sensor Misc 1 each by Does not apply route daily.   Dexcom G6 Transmitter Misc 1 each by Does not apply route daily.   gabapentin 300 MG capsule Commonly known as: NEURONTIN TAKE 1 CAPSULE BY MOUTH IN THE MORNING, 2 CAPSULES AT BEDTIME What changed:  how much to take how to take this when to take this additional instructions   glipiZIDE 10 MG tablet Commonly known as: GLUCOTROL Take 1 tablet (10 mg total) by mouth 2 (two) times daily before a meal.   metoprolol tartrate 25 MG tablet Commonly known as: LOPRESSOR Take 0.5 tablets (12.5 mg total) by mouth 2 (two) times daily.   MULTIVITAMIN ADULTS 50+ PO Take 1 tablet by mouth daily. Chew    ondansetron 8 MG disintegrating tablet Commonly known as: ZOFRAN-ODT Take 1 tablet (8 mg total) by mouth every 8 (eight) hours as needed for nausea.   oxyCODONE 5 MG immediate release tablet Commonly known as: Oxy IR/ROXICODONE Take 1 tablet (5 mg total) by mouth every 6 (six) hours as needed for up to 7 days for severe pain.   pantoprazole 40 MG tablet Commonly known as: PROTONIX Take 1 tablet (40 mg total) by mouth daily.   tamsulosin 0.4 MG Caps capsule Commonly known as: FLOMAX TAKE 2 CAPSULES BY MOUTH EVERY DAY What changed: when to take this   Trelegy Ellipta 100-62.5-25 MCG/INH Aepb Generic drug: Fluticasone-Umeclidin-Vilant INHALE 1 PUFF BY MOUTH EVERY DAY What changed: See the new instructions.   triamcinolone 55 MCG/ACT Aero nasal inhaler Commonly known as: NASACORT Place 2 sprays into the nose daily.   Trulicity 4.5 UE/2.8MK Sopn Generic drug: Dulaglutide Inject 1 pen into the skin once a week. What changed: how much to take   Vitamin D3 10 MCG (400 UNIT) Chew Chew by mouth.               Durable Medical Equipment  (From admission, onward)           Start     Ordered   07/19/19 0737  For home use only DME oxygen  Once    Question Answer Comment  Length of Need 6 Months   Mode or (Route) Nasal cannula   Liters per Minute 2   Frequency Only at night (stationary unit needed)   Oxygen delivery system Gas      07/19/19 0736           Follow-up Information     Ivin Poot, MD Follow up.   Specialty: Cardiothoracic Surgery Why: Please see  discharge paperwork for follow-up appointment with surgeon.  On the date you see the surgeon please also obtain a chest x-ray at [facility] 1/2-hour prior to this appointment.  It is located in the same office complex on the first floo Contact information: [address] [place] [address] [phone]         Lelon Perla, MD Follow up.   Specialty:  Cardiology Why: Please see discharge paperwork for follow-up appointment with cardiology. Contact information: [address] [place] [address] [place], [facility] Follow up.   Specialty: Home Health Services Why: [facility] arranged- they will call you to set up home visits          The patient has been discharged on:   1.Beta Blocker:  Yes [ X  ]                              No   [   ]                              If No, reason:  2.Ace Inhibitor/ARB: Yes [   ]                                     No  [  X ]                                     If No, reason: AKI, creatinine >2  3.Statin:   Yes [ Y  ]                  No  [   ]                  If No, reason:  4.Ecasa:  Yes  [place]   ]                  No   [   ]                  If No, reason:    Signed: [place] 07/20/2019, 7:51 AM  DC instructions reviewed with patient patient examined and medical record reviewed,agree with above note. Tharon Aquas Trigt III 07/26/2019

## 2019-07-13 NOTE — Progress Notes (Addendum)
[facility] DAILY ICU PROGRESS NOTE                   [address]            [place] [address]          [phone]   1 Day Post-Op Procedure(s) (LRB): CORONARY ARTERY BYPASS GRAFTING (CABG), ON PUMP, TIMES FOUR , USING LEFT INTERNAL MAMMARY ARTERY AND ENDOSCOPICALLY HARVESTED BILATERAL GREATER SAPHENOUS VEINS (N/A) TRANSESOPHAGEAL ECHOCARDIOGRAM (TEE) (N/A) Insertion Central Line Adult (Right)  Total Length of Stay:  LOS: 1 day   Subjective: Feels minor discomforts, has been confused overnight. Currently oriented x2 but seems to re-orient easily. Mentation is slow  Objective: Vital signs in last 24 hours: Temp:  [96.4 F (35.8 C)-99.5 F (37.5 C)] 99 F (37.2 C) (03/03 0700) Pulse Rate:  [81-107] 99 (03/03 0700) Cardiac Rhythm: Normal sinus rhythm (03/03 0400) Resp:  [11-20] 19 (03/03 0700) BP: (82-137)/(47-80) 127/67 (03/03 0700) SpO2:  [93 %-100 %] 93 % (03/03 0700) Arterial Line BP: (71-132)/(48-64) 119/51 (03/03 0700) FiO2 (%):  [40 %-50 %] 40 % (03/02 2210) Weight:  [712 kg] 123 kg (03/03 0500)  Filed Weights   07/12/19 0547 07/13/19 0500  Weight: 115.7 kg 123 kg    Weight change: 7.333 kg   Hemodynamic parameters for last 24 hours: PAP: (22-34)/(13-26) 26/16 CO:  [3.6 L/min-6.9 L/min] 6.9 L/min CI:  [1.6 L/min/m2-3.1 L/min/m2] 3.1 L/min/m2  Intake/Output from previous day: 03/02 0701 - 03/03 0700 In: 5797.4 [I.V.:2775; Blood:770; IV Piggyback:2252.4] Out: 3930 [Urine:2960; Blood:500; Chest Tube:470]  Intake/Output this shift: Total I/O In: -  Out: 135 [Urine:60; Chest Tube:75]  Current Meds: Scheduled Meds: . sodium chloride   Intravenous Once  . acetaminophen  1,000 mg Oral Q6H   Or  . acetaminophen (TYLENOL) oral liquid 160 mg/5 mL  1,000 mg Per Tube Q6H  . aspirin EC  325 mg Oral Daily   Or  . aspirin  324 mg Per Tube Daily  . atorvastatin  80 mg Oral q1800  . bisacodyl  10 mg Oral Daily   Or  . bisacodyl  10 mg Rectal  Daily  . Chlorhexidine Gluconate Cloth  6 each Topical Daily  . docusate sodium  200 mg Oral Daily  . gabapentin  300 mg Oral QHS  . mouth rinse  15 mL Mouth Rinse BID  . [START ON 07/14/2019] pantoprazole  40 mg Oral Daily  . sodium chloride flush  10-40 mL Intracatheter Q12H  . sodium chloride flush  3 mL Intravenous Q12H   Continuous Infusions: . sodium chloride Stopped (07/13/19 0622)  . sodium chloride    . sodium chloride 20 mL/hr at 07/12/19 1540  . albumin human 12.5 g (07/12/19 1850)  . cefUROXime (ZINACEF)  IV 1.5 g (07/13/19 0743)  . dexmedetomidine (PRECEDEX) IV infusion Stopped (07/12/19 1723)  . insulin 5.5 mL/hr at 07/13/19 0700  . lactated ringers    . lactated ringers 20 mL/hr at 07/12/19 1600  . magnesium sulfate    . milrinone 0.3 mcg/kg/min (07/13/19 0700)  . nitroGLYCERIN Stopped (07/12/19 1540)  . norepinephrine 10 mcg/min (07/13/19 0700)  . phenylephrine (NEO-SYNEPHRINE) Adult infusion 15 mcg/min (07/13/19 0700)   PRN Meds:.sodium chloride, albumin human, albuterol, dextrose, metoprolol tartrate, midazolam, morphine injection, ondansetron (ZOFRAN) IV, oxyCODONE, sodium chloride flush, sodium chloride flush  General appearance: alert, cooperative, no distress and slowed mentation Heart: regular rate and rhythm Lungs: dim in lower fields Abdomen:  soft, non-tender Extremities: + edematous Wound: dressings CDI  Lab Results: CBC: Recent Labs    07/12/19 2116 07/12/19 2247 07/13/19 0341 07/13/19 0349  WBC 12.1*  --  12.7*  --   HGB 10.6*   < > 10.6* 9.9*  HCT 32.0*   < > 32.1* 29.0*  PLT 104*  --  122*  --    < > = values in this interval not displayed.   BMET:  Recent Labs    07/12/19 2116 07/12/19 2247 07/13/19 0341 07/13/19 0349  NA 143   < > 141 144  K 4.8   < > 4.9 5.0  CL 116*  --  113*  --   CO2 20*  --  21*  --   GLUCOSE 170*  --  171*  --   BUN 26*  --  24*  --   CREATININE 1.94*  --  2.07*  --   CALCIUM 7.2*  --  7.5*  --    < >  = values in this interval not displayed.    CMET: Lab Results  Component Value Date   WBC 12.7 (H) 07/13/2019   HGB 9.9 (L) 07/13/2019   HCT 29.0 (L) 07/13/2019   PLT 122 (L) 07/13/2019   GLUCOSE 171 (H) 07/13/2019   CHOL 146 07/01/2019   TRIG 270 (H) 07/01/2019   HDL 24 (L) 07/01/2019   LDLCALC 68 07/01/2019   ALT 40 07/08/2019   AST 32 07/08/2019   NA 144 07/13/2019   K 5.0 07/13/2019   CL 113 (H) 07/13/2019   CREATININE 2.07 (H) 07/13/2019   BUN 24 (H) 07/13/2019   CO2 21 (L) 07/13/2019   TSH 4.72 (H) 10/19/2018   INR 1.6 (H) 07/12/2019   HGBA1C 9.7 (H) 07/08/2019      PT/INR:  Recent Labs    07/12/19 1555  LABPROT 18.5*  INR 1.6*   Radiology: DG Chest 1 View  Result Date: 07/12/2019 CLINICAL DATA:  Post CABG EXAM: CHEST  1 VIEW COMPARISON:  07/08/2019, CT 06/23/2019 FINDINGS: Interval intubation, tip of the endotracheal tube is about 2 cm superior to carina. Interval sternotomy changes and placement of mediastinal and left chest drainage catheter. Esophageal device tip overlies the lower mediastinum. Right subclavian central venous catheter tip over the cavoatrial region, there is a more linear radiopaque segment over the SVC, without extension distal to this to suggest wire. Right IJ central venous catheter tip is poorly visible. Catheter tubing is seen to the cardiac base with possible ascending catheter segment in the region of pulmonary outflow tract though obscured by overlying esophageal device. Enlarged cardiomediastinal silhouette. Widening of the mediastinum presumably due to low lung volume, portable technique and recent surgical intervention. Small bilateral effusions. Airspace disease at the left greater than right lung base. No pneumothorax. IMPRESSION: 1. Interval placement of support lines and tubes as described above. 2. Poorly visible tip of what is presumed to be right IJ Swan-Ganz catheter. Catheter tubing visible to the base of the heart but it is unclear  if the tip terminates at the right ventricle or ascends to the pulmonary outflow tract as catheter is potentially obscured by esophageal device. 3. Low lung volumes with enlarged cardiomediastinal silhouette, small pleural effusions and left greater than right basilar airspace disease, likely atelectasis. Electronically Signed   By: Donavan Foil M.D.   On: 07/12/2019 16:26     Assessment/Plan: S/P Procedure(s) (LRB): CORONARY ARTERY BYPASS GRAFTING (CABG), ON PUMP, TIMES FOUR , USING LEFT  INTERNAL MAMMARY ARTERY AND ENDOSCOPICALLY HARVESTED BILATERAL GREATER SAPHENOUS VEINS (N/A) TRANSESOPHAGEAL ECHOCARDIOGRAM (TEE) (N/A) Insertion Central Line Adult (Right)  POD#1  1 hemodyn stable in sinus rhythm on low dose levo, milrinone and neo- will wean as able and get swan/aline out over time. CI/CO, PAP's are good 2 sats ok on 4 liters, ABG, mild hypoxemia, CXR poor inspiration- small effus/atx 3 renal fxn trend a little worse Acute on chronic renal insuff, Creat 2.07. Making good UOP 4 expected ABL anemia- stable, cont to monitor clinically-CT 470 yesterday, 75 so far today 5  Minor thrombocytopenia with improving trend 6 minor leukocytosis- monitor, Tmax 99.5 7 BS fairly well controlled- cont usual progression  Gaspar Bidding 07/13/2019 8:12 AM  Pager [phone]  Overall progressing although slowly after multivessel CABG We will DC PA catheter, up to chair and slowly wean norepinephrine to keep systolic blood pressure greater than 100 millimeters mercury. Patient with single kidney and baseline creatinine 2.4-continue to hold Lasix for now with urine output 46 to 50 cc/h.  patient examined and medical record reviewed,agree with above note. Tharon Aquas Trigt III 07/13/2019

## 2019-07-13 NOTE — Anesthesia Postprocedure Evaluation (Signed)
Anesthesia Post Note  Patient: Clinton Ortiz  Procedure(s) Performed: CORONARY ARTERY BYPASS GRAFTING (CABG), ON PUMP, TIMES FOUR , USING LEFT INTERNAL MAMMARY ARTERY AND ENDOSCOPICALLY HARVESTED BILATERAL GREATER SAPHENOUS VEINS (N/A Chest) TRANSESOPHAGEAL ECHOCARDIOGRAM (TEE) (N/A ) Insertion Central Line Adult (Right Neck)     Patient location during evaluation: PACU Anesthesia Type: General Level of consciousness: awake and alert Pain management: pain level controlled Vital Signs Assessment: post-procedure vital signs reviewed and stable Respiratory status: spontaneous breathing, nonlabored ventilation, respiratory function stable and patient connected to nasal cannula oxygen Cardiovascular status: blood pressure returned to baseline and stable Postop Assessment: no apparent nausea or vomiting Anesthetic complications: no    Last Vitals:  Vitals:   07/13/19 1900 07/13/19 2000  BP: (!) 121/99 135/74  Pulse: (!) 128 (!) 127  Resp: 15 (!) 22  Temp:  37.3 C  SpO2: 92% 94%    Last Pain:  Vitals:   07/13/19 2000  TempSrc: Oral  PainSc: 3                  Effie Berkshire

## 2019-07-14 ENCOUNTER — Inpatient Hospital Stay (HOSPITAL_COMMUNITY): Payer: Medicare Other

## 2019-07-14 LAB — CBC
HCT: 29.6 % — ABNORMAL LOW (ref 39.0–52.0)
Hemoglobin: 9.6 g/dL — ABNORMAL LOW (ref 13.0–17.0)
MCH: 32.9 pg (ref 26.0–34.0)
MCHC: 32.4 g/dL (ref 30.0–36.0)
MCV: 101.4 fL — ABNORMAL HIGH (ref 80.0–100.0)
Platelets: 83 10*3/uL — ABNORMAL LOW (ref 150–400)
RBC: 2.92 MIL/uL — ABNORMAL LOW (ref 4.22–5.81)
RDW: 14 % (ref 11.5–15.5)
WBC: 12.1 10*3/uL — ABNORMAL HIGH (ref 4.0–10.5)
nRBC: 0 % (ref 0.0–0.2)

## 2019-07-14 LAB — GLUCOSE, CAPILLARY
Glucose-Capillary: 183 mg/dL — ABNORMAL HIGH (ref 70–99)
Glucose-Capillary: 184 mg/dL — ABNORMAL HIGH (ref 70–99)
Glucose-Capillary: 187 mg/dL — ABNORMAL HIGH (ref 70–99)
Glucose-Capillary: 196 mg/dL — ABNORMAL HIGH (ref 70–99)
Glucose-Capillary: 202 mg/dL — ABNORMAL HIGH (ref 70–99)
Glucose-Capillary: 236 mg/dL — ABNORMAL HIGH (ref 70–99)

## 2019-07-14 LAB — RENAL FUNCTION PANEL
Albumin: 2.7 g/dL — ABNORMAL LOW (ref 3.5–5.0)
Anion gap: 9 (ref 5–15)
BUN: 24 mg/dL — ABNORMAL HIGH (ref 8–23)
CO2: 23 mmol/L (ref 22–32)
Calcium: 7.8 mg/dL — ABNORMAL LOW (ref 8.9–10.3)
Chloride: 107 mmol/L (ref 98–111)
Creatinine, Ser: 2.55 mg/dL — ABNORMAL HIGH (ref 0.61–1.24)
GFR calc Af Amer: 27 mL/min — ABNORMAL LOW (ref >60)
GFR calc non Af Amer: 23 mL/min — ABNORMAL LOW (ref >60)
Glucose, Bld: 205 mg/dL — ABNORMAL HIGH (ref 70–99)
Phosphorus: 3.4 mg/dL (ref 2.5–4.6)
Potassium: 4.3 mmol/L (ref 3.5–5.1)
Sodium: 139 mmol/L (ref 135–145)

## 2019-07-14 LAB — BASIC METABOLIC PANEL
Anion gap: 7 (ref 5–15)
BUN: 22 mg/dL (ref 8–23)
CO2: 23 mmol/L (ref 22–32)
Calcium: 7.7 mg/dL — ABNORMAL LOW (ref 8.9–10.3)
Chloride: 110 mmol/L (ref 98–111)
Creatinine, Ser: 2.4 mg/dL — ABNORMAL HIGH (ref 0.61–1.24)
GFR calc Af Amer: 29 mL/min — ABNORMAL LOW (ref >60)
GFR calc non Af Amer: 25 mL/min — ABNORMAL LOW (ref >60)
Glucose, Bld: 219 mg/dL — ABNORMAL HIGH (ref 70–99)
Potassium: 4.3 mmol/L (ref 3.5–5.1)
Sodium: 140 mmol/L (ref 135–145)

## 2019-07-14 LAB — COOXEMETRY PANEL
Carboxyhemoglobin: 1.5 % (ref 0.5–1.5)
Methemoglobin: 1.4 % (ref 0.0–1.5)
O2 Saturation: 70.7 %
Total hemoglobin: 9.7 g/dL — ABNORMAL LOW (ref 12.0–16.0)

## 2019-07-14 MED ORDER — ENOXAPARIN SODIUM 40 MG/0.4ML ~~LOC~~ SOLN
40.0000 mg | SUBCUTANEOUS | Status: DC
  Administered 2019-07-14 – 2019-07-19 (×6): 40 mg via SUBCUTANEOUS
  Filled 2019-07-14 (×5): qty 0.4

## 2019-07-14 MED ORDER — HEPARIN SODIUM (PORCINE) 5000 UNIT/ML IJ SOLN
INTRAMUSCULAR | Status: AC
  Filled 2019-07-14: qty 1

## 2019-07-14 MED ORDER — MILRINONE LACTATE IN DEXTROSE 20-5 MG/100ML-% IV SOLN
0.1250 ug/kg/min | INTRAVENOUS | Status: DC
  Administered 2019-07-15: 0.125 ug/kg/min via INTRAVENOUS
  Filled 2019-07-14: qty 100

## 2019-07-14 MED ORDER — MOMETASONE FURO-FORMOTEROL FUM 200-5 MCG/ACT IN AERO
2.0000 | INHALATION_SPRAY | Freq: Two times a day (BID) | RESPIRATORY_TRACT | Status: DC
  Administered 2019-07-14 – 2019-07-20 (×10): 2 via RESPIRATORY_TRACT
  Filled 2019-07-14 (×2): qty 8.8

## 2019-07-14 MED ORDER — INSULIN ASPART 100 UNIT/ML ~~LOC~~ SOLN
4.0000 [IU] | Freq: Three times a day (TID) | SUBCUTANEOUS | Status: DC
  Administered 2019-07-14 – 2019-07-20 (×15): 4 [IU] via SUBCUTANEOUS

## 2019-07-14 MED ORDER — MAGNESIUM OXIDE 400 (241.3 MG) MG PO TABS
400.0000 mg | ORAL_TABLET | Freq: Two times a day (BID) | ORAL | Status: DC
  Administered 2019-07-14 – 2019-07-20 (×13): 400 mg via ORAL
  Filled 2019-07-14 (×13): qty 1

## 2019-07-14 MED ORDER — CHLORHEXIDINE GLUCONATE 0.12 % MT SOLN
OROMUCOSAL | Status: AC
  Filled 2019-07-14: qty 15

## 2019-07-14 MED ORDER — INSULIN ASPART 100 UNIT/ML ~~LOC~~ SOLN
0.0000 [IU] | Freq: Three times a day (TID) | SUBCUTANEOUS | Status: DC
  Administered 2019-07-14 (×3): 4 [IU] via SUBCUTANEOUS
  Administered 2019-07-15: 8 [IU] via SUBCUTANEOUS
  Administered 2019-07-15 (×3): 4 [IU] via SUBCUTANEOUS
  Administered 2019-07-16: 8 [IU] via SUBCUTANEOUS
  Administered 2019-07-16: 4 [IU] via SUBCUTANEOUS
  Administered 2019-07-17: 8 [IU] via SUBCUTANEOUS
  Administered 2019-07-17 (×2): 2 [IU] via SUBCUTANEOUS
  Administered 2019-07-18: 4 [IU] via SUBCUTANEOUS
  Administered 2019-07-18 – 2019-07-19 (×5): 2 [IU] via SUBCUTANEOUS
  Administered 2019-07-19: 4 [IU] via SUBCUTANEOUS
  Administered 2019-07-19: 8 [IU] via SUBCUTANEOUS
  Administered 2019-07-20: 2 [IU] via SUBCUTANEOUS

## 2019-07-14 MED ORDER — INSULIN DETEMIR 100 UNIT/ML ~~LOC~~ SOLN
24.0000 [IU] | Freq: Two times a day (BID) | SUBCUTANEOUS | Status: DC
  Administered 2019-07-14 – 2019-07-20 (×13): 24 [IU] via SUBCUTANEOUS
  Filled 2019-07-14 (×16): qty 0.24

## 2019-07-14 MED ORDER — GUAIFENESIN ER 600 MG PO TB12
600.0000 mg | ORAL_TABLET | Freq: Two times a day (BID) | ORAL | Status: DC
  Administered 2019-07-14 – 2019-07-20 (×13): 600 mg via ORAL
  Filled 2019-07-14 (×13): qty 1

## 2019-07-14 MED ORDER — FUROSEMIDE 10 MG/ML IJ SOLN
40.0000 mg | Freq: Three times a day (TID) | INTRAMUSCULAR | Status: AC
  Administered 2019-07-14 (×2): 40 mg via INTRAVENOUS
  Filled 2019-07-14 (×2): qty 4

## 2019-07-14 MED ORDER — INSULIN ASPART 100 UNIT/ML ~~LOC~~ SOLN
4.0000 [IU] | Freq: Once | SUBCUTANEOUS | Status: AC
  Administered 2019-07-14: 4 [IU] via SUBCUTANEOUS

## 2019-07-14 NOTE — Progress Notes (Signed)
TCTS Evening Rounds POD #2 s/p CABG Resting comfortably Exam unremarkable sCr slightly increased this pm; good UOP today  A/p: continue present management. Lataysha Vohra Z. Orvan Seen, [place]

## 2019-07-14 NOTE — Progress Notes (Signed)
[address]       [place], [address]             [phone]      2 Days Post-Op Procedure(s) (LRB): CORONARY ARTERY BYPASS GRAFTING (CABG), ON PUMP, TIMES FOUR , USING LEFT INTERNAL MAMMARY ARTERY AND ENDOSCOPICALLY HARVESTED BILATERAL GREATER SAPHENOUS VEINS (N/A) TRANSESOPHAGEAL ECHOCARDIOGRAM (TEE) (N/A) Insertion Central Line Adult (Right) Subjective: More alert this am, some minor discomforts  Objective: Vital signs in last 24 hours: Temp:  [98.4 F (36.9 C)-99.3 F (37.4 C)] 98.4 F (36.9 C) (03/04 0400) Pulse Rate:  [86-139] 106 (03/04 0604) Cardiac Rhythm: Normal sinus rhythm (03/04 0400) Resp:  [0-26] 21 (03/04 0604) BP: (108-158)/(58-99) 135/68 (03/04 0604) SpO2:  [89 %-95 %] 92 % (03/04 0604) Arterial Line BP: (76-143)/(49-57) 133/54 (03/03 1000) Weight:  [123.7 kg] 123.7 kg (03/04 0500)  Hemodynamic parameters for last 24 hours: PAP: (19-26)/(9-18) 20/15 CVP:  [0 mmHg-13 mmHg] 7 mmHg CO:  [6.2 L/min] 6.2 L/min CI:  [2.8 L/min/m2] 2.8 L/min/m2  Intake/Output from previous day: 03/03 0701 - 03/04 0700 In: 2364 [P.O.:60; I.V.:2028.9; IV Piggyback:275.1] Out: 6789 [Urine:2850; Chest Tube:410] Intake/Output this shift: No intake/output data recorded.  General appearance: alert, cooperative and no distress Heart: RRR, tachy Lungs: dim in lower fields Abdomen: minor distension, nontender, + BS Extremities: + edema Wound: dressings intact, minor drainage  Lab Results: Recent Labs    07/13/19 1500 07/14/19 0423  WBC 11.8* 12.1*  HGB 9.8* 9.6*  HCT 30.2* 29.6*  PLT PLATELET CLUMPS NOTED ON SMEAR, UNABLE TO ESTIMATE 83*   BMET:  Recent Labs    07/13/19 1500 07/14/19 0423  NA 142 140  K 4.4 4.3  CL 113* 110  CO2 21* 23  GLUCOSE 177* 219*  BUN 24* 22  CREATININE 2.24* 2.40*  CALCIUM 7.6* 7.7*    PT/INR:  Recent Labs    07/12/19 1555  LABPROT 18.5*  INR 1.6*   ABG    Component Value Date/Time   PHART 7.373  07/13/2019 0349   HCO3 21.8 07/13/2019 0349   TCO2 23 07/13/2019 0349   ACIDBASEDEF 3.0 (H) 07/13/2019 0349   O2SAT 70.7 07/14/2019 0519   CBG (last 3)  Recent Labs    07/13/19 1935 07/14/19 0000 07/14/19 0416  GLUCAP 198* 202* 184*    Meds Scheduled Meds: . sodium chloride   Intravenous Once  . acetaminophen  1,000 mg Oral Q6H   Or  . acetaminophen (TYLENOL) oral liquid 160 mg/5 mL  1,000 mg Per Tube Q6H  . aspirin EC  325 mg Oral Daily   Or  . aspirin  324 mg Per Tube Daily  . atorvastatin  80 mg Oral q1800  . bisacodyl  10 mg Oral Daily   Or  . bisacodyl  10 mg Rectal Daily  . Chlorhexidine Gluconate Cloth  6 each Topical Daily  . docusate sodium  200 mg Oral Daily  . gabapentin  300 mg Oral QHS  . insulin aspart  0-24 Units Subcutaneous Q4H  . insulin detemir  20 Units Subcutaneous BID  . mouth rinse  15 mL Mouth Rinse BID  . pantoprazole  40 mg Oral Daily  . sodium chloride flush  10-40 mL Intracatheter Q12H  . sodium chloride flush  3 mL Intravenous Q12H   Continuous Infusions: . sodium chloride Stopped (07/13/19 0954)  . sodium chloride    . sodium chloride 20 mL/hr at 07/12/19 1540  . amiodarone 30 mg/hr (  07/14/19 0600)  . cefUROXime (ZINACEF)  IV Stopped (07/13/19 2131)  . insulin Stopped (07/13/19 1344)  . lactated ringers    . lactated ringers 20 mL/hr at 07/14/19 0600  . magnesium sulfate    . milrinone 0.25 mcg/kg/min (07/14/19 0600)  . norepinephrine 3 mcg/min (07/14/19 0600)   PRN Meds:.sodium chloride, albuterol, dextrose, metoprolol tartrate, midazolam, morphine injection, ondansetron (ZOFRAN) IV, oxyCODONE, sodium chloride flush, sodium chloride flush  Xrays DG Chest 1 View  Result Date: 07/12/2019 CLINICAL DATA:  Post CABG EXAM: CHEST  1 VIEW COMPARISON:  07/08/2019, CT 06/23/2019 FINDINGS: Interval intubation, tip of the endotracheal tube is about 2 cm superior to carina. Interval sternotomy changes and placement of mediastinal and left chest  drainage catheter. Esophageal device tip overlies the lower mediastinum. Right subclavian central venous catheter tip over the cavoatrial region, there is a more linear radiopaque segment over the SVC, without extension distal to this to suggest wire. Right IJ central venous catheter tip is poorly visible. Catheter tubing is seen to the cardiac base with possible ascending catheter segment in the region of pulmonary outflow tract though obscured by overlying esophageal device. Enlarged cardiomediastinal silhouette. Widening of the mediastinum presumably due to low lung volume, portable technique and recent surgical intervention. Small bilateral effusions. Airspace disease at the left greater than right lung base. No pneumothorax. IMPRESSION: 1. Interval placement of support lines and tubes as described above. 2. Poorly visible tip of what is presumed to be right IJ Swan-Ganz catheter. Catheter tubing visible to the base of the heart but it is unclear if the tip terminates at the right ventricle or ascends to the pulmonary outflow tract as catheter is potentially obscured by esophageal device. 3. Low lung volumes with enlarged cardiomediastinal silhouette, small pleural effusions and left greater than right basilar airspace disease, likely atelectasis. Electronically Signed   By: Donavan Foil M.D.   On: 07/12/2019 16:26   DG Chest Port 1 View  Result Date: 07/13/2019 CLINICAL DATA:  79 year old male with history of chest tube following CABG. EXAM: PORTABLE CHEST 1 VIEW COMPARISON:  Chest x-ray 07/12/2019. FINDINGS: Patient has been extubated. Previously noted left-sided chest tube is stable in position with tip and side port projecting over the left hemithorax. Transesophageal probe has been removed. Right subclavian central venous catheter with tip terminating at the superior cavoatrial junction. Right internal jugular Cordis through which a Swan-Ganz catheter has been passed into the pulmonic trunk. Midline  drain, likely pericardial or mediastinal. Low lung volumes. Bibasilar opacities which likely reflect areas of atelectasis. Small bilateral pleural effusions. No appreciable pneumothorax. No evidence of pulmonary edema. Cardiopericardial silhouette is within normal limits allowing for patient positioning and postoperative status. Upper mediastinal contours are unremarkable. Status post median sternotomy for CABG. IMPRESSION: 1. Postoperative changes and support apparatus, as above. 2. Low lung volumes with bibasilar areas of dependent subsegmental atelectasis and small bilateral pleural effusions. Electronically Signed   By: Vinnie Langton M.D.   On: 07/13/2019 09:46   ECHO INTRAOPERATIVE TEE  Result Date: 07/12/2019  *INTRAOPERATIVE TRANSESOPHAGEAL REPORT *  Patient Name:   Clinton Ortiz  Date of Exam: 07/12/2019 Medical Rec #:  [id number]  Height:       67.0 in Accession #:    [id number] Weight:       255.0 lb Date of Birth:  1940-10-03  BSA:          2.24 m Patient Age:    39 years   BP:  150/89 mmHg Patient Gender: M          HR:           66 bpm. Exam Location:  Inpatient Transesophogeal exam was perform intraoperatively during surgical procedure. Patient was closely monitored under general anesthesia during the entirety of examination. Indications:     CABG Performing Phys: [place] TRIGT Diagnosing Phys: Suella Broad MD Complications: No known complications during this procedure. POST-OP IMPRESSIONS Overall, there were no significant changes from pre-bypass. - Left Ventricle: The left ventricle is unchanged from pre-bypass. - Right Ventricle: The right ventricle appears unchanged from pre-bypass. - Aorta: The aorta appears unchanged from pre-bypass. - Left Atrium: The left atrium appears unchanged from pre-bypass. - Left Atrial Appendage: The left atrial appendage appears unchanged from pre-bypass. - Aortic Valve: The aortic valve appears unchanged from pre-bypass. - Mitral Valve: The mitral valve  appears unchanged from pre-bypass. - Tricuspid Valve: The tricuspid valve appears unchanged from pre-bypass. - Interventricular Septum: The interventricular septum appears unchanged from pre-bypass. - Pericardium: The pericardium appears unchanged from pre-bypass. - Comments: Post Bypass Assessment  - LVEF unchanged, No RWMA's - CO 3.3, improving with volume resuscitation. - No valvular changes. - No dissection noted after cannula removal. PRE-OP FINDINGS  Left Ventricle: The left ventricle has normal systolic function, with an ejection fraction of 55-60%. The cavity size was normal. There is no increase in left ventricular wall thickness. No evidence of left ventricular regional wall motion abnormalities. Right Ventricle: The right ventricle has normal systolic function. The cavity was normal. There is no increase in right ventricular wall thickness. Right ventricular systolic pressure is normal. Left Atrium: Left atrial size was normal in size. The left atrial appendage is well visualized and there is no evidence of thrombus present. Left atrial appendage velocity is normal at greater than 40 cm/s. Right Atrium: Right atrial pressure is normal. Interatrial Septum: No atrial level shunt detected by color flow Doppler. Pericardium: There is no evidence of pericardial effusion. There is no pleural effusion. Mitral Valve: The mitral valve is normal in structure. Mild thickening of the mitral valve leaflet. Mild calcification of the mitral valve leaflet. Mitral valve regurgitation is trivial by color flow Doppler. The MR jet is anteriorly-directed. There is no evidence of mitral valve vegetation. Tricuspid Valve: The tricuspid valve was not well visualized. Tricuspid valve regurgitation was not visualized by color flow Doppler. Aortic Valve: The aortic valve is tricuspid There is mild thickening of the aortic valve Aortic valve regurgitation was not visualized by color flow Doppler. There is no evidence of aortic valve  stenosis. There is no evidence of a vegetation on the aortic valve. Pulmonic Valve: The pulmonic valve was normal in structure. Pulmonic valve regurgitation is trivial by color flow Doppler. Aorta: The aortic root is normal in size and structure. There is mild dilatation of the ascending aorta measuring 38 mm. There is evidence of protruding and layered immobile plaque in the descending aorta and aortic arch; Grade II, measuring 2-84mm in size. Pulmonary Artery: The pulmonary artery is of normal size. +--------------+-------++ LEFT VENTRICLE        +--------------+-------++ PLAX 2D               +--------------+-------++ LVIDd:        4.40 cm +--------------+-------++ LVIDs:        3.10 cm +--------------+-------++ LV SV:        50 ml   +--------------+-------++ LV SV Index:  20.80   +--------------+-------++                       +--------------+-------++  Suella Broad MD Electronically signed by Suella Broad MD Signature Date/Time: 07/12/2019/3:44:00 PM    Final     Assessment/Plan: S/P Procedure(s) (LRB): CORONARY ARTERY BYPASS GRAFTING (CABG), ON PUMP, TIMES FOUR , USING LEFT INTERNAL MAMMARY ARTERY AND ENDOSCOPICALLY HARVESTED BILATERAL GREATER SAPHENOUS VEINS (N/A) TRANSESOPHAGEAL ECHOCARDIOGRAM (TEE) (N/A) Insertion Central Line Adult (Right) POD#2 1 hemodyn stabe in sinus tachy, dis have some afib , including RVR- now on amio gtt. Co-Ox is 70, hopefully can wean norepi and then milrinone off.  2 creat rising slowly( 2.4), may improve with decrease in norepi, will need diuresis for volume overload 3 sats good on 4 liters. CXR very stable in appearance c/w yesterday 4 leukocytosis pretty stable, minor, no fevers- observe clinically 5 CT drainage 410 yesterday- keep tubes for now 6 H/H stable 7 thrombocytopenia trend a little worse, currently not on lovenox- monitor and may need to consider HIT panel 8 add magnesium 9 rehab as able, push pulm toilet as able  LOS: 2  days    Trashawn Giovanni PA-C Pager [phone] 07/14/2019

## 2019-07-14 NOTE — Progress Notes (Signed)
Melynn Rn aware of order to d/c CVC

## 2019-07-14 NOTE — Progress Notes (Signed)
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 Pt now s/p CABG x4 on 07/12/2019.    PT Comments    Pt tolerated treatment well, impulsive at times, initiating mobility prior to PT setting lines in place. Pt continues to require physical assistance to perform bed mobility as well as verbal cues to maintain sternal precautions during all mobility. Pt continues to ambulate with reduced gait speed and shuffling steps, increasing his falls risk. Pt will benefit from continued PT POC and aggressive mobilization to improve activity tolerance and reduce assistance requirements.   Follow Up Recommendations  Home health PT;Supervision/Assistance - 24 hour     Equipment Recommendations  (pt owns necessary DME)    Recommendations for Other Services       Precautions / Restrictions Precautions Precautions: Fall;Sternal Precaution Booklet Issued: No Precaution Comments: verbally reviewed sternal and pacemaker precautions Restrictions Weight Bearing Restrictions: Yes Other Position/Activity Restrictions: sternal precautions    Mobility  Bed Mobility Overal bed mobility: Needs Assistance Bed Mobility: Supine to Sit;Sit to Supine     Supine to sit: Mod assist Sit to supine: Min assist      Transfers Overall transfer level: Needs assistance Equipment used: (EVA walker) Transfers: Sit to/from Stand Sit to Stand: Min guard         General transfer comment: cues for sternal precautions  Ambulation/Gait Ambulation/Gait assistance: Min guard Gait Distance (Feet): 120 Feet Assistive device: (EVA walker) Gait Pattern/deviations: Step-to  pattern;Shuffle Gait velocity: reduced Gait velocity interpretation: <1.8 ft/sec, indicate of risk for recurrent falls General Gait Details: pt with short shuffling steps, some improvement in step length with PT verbal cues, however pt reverts to shuffling with fatigue   Stairs             Wheelchair Mobility    Modified Rankin (Stroke Patients Only)       Balance Overall balance assessment: Needs assistance Sitting-balance support: No upper extremity supported;Feet supported Sitting balance-Leahy Scale: Good Sitting balance - Comments: supervision   Standing balance support: Bilateral upper extremity supported Standing balance-Leahy Scale: Fair Standing balance comment: minG with BUE support of EVA walker                            Cognition Arousal/Alertness: Awake/alert Behavior During Therapy: Impulsive Overall Cognitive Status: Within Functional Limits for tasks assessed                                        Exercises      General Comments General comments (skin integrity, edema, etc.): pt on 3L [place] at rest, desat to mid 80s with ambulation, PT increases pt to 4L [place] with improvement in sats over 90%, pt returned to 3L [place] at rest with stable sats      Pertinent Vitals/Pain Pain Assessment: No/denies pain    Home Living                      Prior Function            PT Goals (current goals can now be found in the care  plan section) Acute Rehab PT Goals Patient Stated Goal: To return to independent mobility Progress towards PT goals: Progressing toward goals    Frequency    Min 3X/week      PT Plan Current plan remains appropriate    Co-evaluation              AM-PAC PT "6 Clicks" Mobility   Outcome Measure  Help needed turning from your back to your side while in a flat bed without using bedrails?: A Lot Help needed moving from lying on your back to sitting on the side of a flat bed without using  bedrails?: A Lot Help needed moving to and from a bed to a chair (including a wheelchair)?: A Little Help needed standing up from a chair using your arms (e.g., wheelchair or bedside chair)?: A Little Help needed to walk in hospital room?: A Little Help needed climbing 3-5 steps with a railing? : A Lot 6 Click Score: 15    End of Session Equipment Utilized During Treatment: Oxygen Activity Tolerance: Patient tolerated treatment well Patient left: in bed;with call bell/phone within reach;with family/visitor present Nurse Communication: Mobility status PT Visit Diagnosis: Muscle weakness (generalized) (M62.81);Other abnormalities of gait and mobility (R26.89)     Time: 1660-6301 PT Time Calculation (min) (ACUTE ONLY): 24 min  Charges:  $Gait Training: 8-22 mins $Therapeutic Activity: 8-22 mins                     Zenaida Niece, PT, DPT Acute Rehabilitation Pager: [phone]    Zenaida Niece 07/14/2019, 1:49 PM

## 2019-07-14 NOTE — Plan of Care (Signed)
Pt is alert and oriented, answers questions appropriately although he is slow to respond. Pt's O2 was increased to 4L. Pt was encouraged to notify RN of pain when needing pain medecine. Pt has been encouraged to take deep breaths and practice the incentive. Pt has generalized weakness but is able to move all extremeties. He has been hemodynamically stable throughout the night, has had adequate UOP, and minimal chest tube drainage. Pt continues on levo, milrinone, and amio drips. Call bell is within reach, bed alarm is on, and bed is in lowest position. Problem: Clinical Measurements: Goal: Ability to maintain clinical measurements within normal limits will improve Outcome: Progressing Goal: Cardiovascular complication will be avoided Outcome: Progressing   Problem: Elimination: Goal: Will not experience complications related to urinary retention Outcome: Progressing   Problem: Safety: Goal: Ability to remain free from injury will improve Outcome: Progressing

## 2019-07-14 NOTE — Progress Notes (Signed)
2 Days Post-Op Procedure(s) (LRB): CORONARY ARTERY BYPASS GRAFTING (CABG), ON PUMP, TIMES FOUR , USING LEFT INTERNAL MAMMARY ARTERY AND ENDOSCOPICALLY HARVESTED BILATERAL GREATER SAPHENOUS VEINS (N/A) TRANSESOPHAGEAL ECHOCARDIOGRAM (TEE) (N/A) Insertion Central Line Adult (Right) Subjective: Back in nsr Creat at baseline 2.4 Objective: Vital signs in last 24 hours: Temp:  [97.9 F (36.6 C)-99.3 F (37.4 C)] 97.9 F (36.6 C) (03/04 0700) Pulse Rate:  [86-139] 106 (03/04 0604) Cardiac Rhythm: Normal sinus rhythm (03/04 0400) Resp:  [0-26] 21 (03/04 0604) BP: (108-158)/(58-99) 135/68 (03/04 0604) SpO2:  [89 %-95 %] 92 % (03/04 0604) Arterial Line BP: (76-138)/(52-57) 133/54 (03/03 1000) Weight:  [123.7 kg] 123.7 kg (03/04 0500)  Hemodynamic parameters for last 24 hours: PAP: (19-24)/(9-18) 20/15 CVP:  [0 mmHg-13 mmHg] 7 mmHg  Intake/Output from previous day: 03/03 0701 - 03/04 0700 In: 2364 [P.O.:60; I.V.:2028.9; IV Piggyback:275.1] Out: 7356 [Urine:2850; Chest Tube:410] Intake/Output this shift: No intake/output data recorded.       Exam    General- alert and comfortable    Neck- no JVD, no cervical adenopathy palpable, no carotid bruit   Lungs- clear without rales, wheezes   Cor- regular rate and rhythm, no murmur , gallop   Abdomen- soft, non-tender   Extremities - warm, non-tender, minimal edema   Neuro- oriented, appropriate, no focal weakness   Lab Results: Recent Labs    07/13/19 1500 07/14/19 0423  WBC 11.8* 12.1*  HGB 9.8* 9.6*  HCT 30.2* 29.6*  PLT PLATELET CLUMPS NOTED ON SMEAR, UNABLE TO ESTIMATE 83*   BMET:  Recent Labs    07/13/19 1500 07/14/19 0423  NA 142 140  K 4.4 4.3  CL 113* 110  CO2 21* 23  GLUCOSE 177* 219*  BUN 24* 22  CREATININE 2.24* 2.40*  CALCIUM 7.6* 7.7*    PT/INR:  Recent Labs    07/12/19 1555  LABPROT 18.5*  INR 1.6*   ABG    Component Value Date/Time   PHART 7.373 07/13/2019 0349   HCO3 21.8 07/13/2019 0349   TCO2 23 07/13/2019 0349   ACIDBASEDEF 3.0 (H) 07/13/2019 0349   O2SAT 70.7 07/14/2019 0519   CBG (last 3)  Recent Labs    07/14/19 0000 07/14/19 0416 07/14/19 0751  GLUCAP 202* 184* 236*    Assessment/Plan: S/P Procedure(s) (LRB): CORONARY ARTERY BYPASS GRAFTING (CABG), ON PUMP, TIMES FOUR , USING LEFT INTERNAL MAMMARY ARTERY AND ENDOSCOPICALLY HARVESTED BILATERAL GREATER SAPHENOUS VEINS (N/A) TRANSESOPHAGEAL ECHOCARDIOGRAM (TEE) (N/A) Insertion Central Line Adult (Right) Mobilize Diuresis Diabetes control d/c tubes/lines cont iv amiodarone, low dose milrinone   LOS: 2 days    Clinton Ortiz 07/14/2019

## 2019-07-15 ENCOUNTER — Inpatient Hospital Stay (HOSPITAL_COMMUNITY): Payer: Medicare Other

## 2019-07-15 LAB — CBC
HCT: 28.8 % — ABNORMAL LOW (ref 39.0–52.0)
Hemoglobin: 9.3 g/dL — ABNORMAL LOW (ref 13.0–17.0)
MCH: 32.4 pg (ref 26.0–34.0)
MCHC: 32.3 g/dL (ref 30.0–36.0)
MCV: 100.3 fL — ABNORMAL HIGH (ref 80.0–100.0)
Platelets: 75 10*3/uL — ABNORMAL LOW (ref 150–400)
RBC: 2.87 MIL/uL — ABNORMAL LOW (ref 4.22–5.81)
RDW: 14 % (ref 11.5–15.5)
WBC: 8.2 10*3/uL (ref 4.0–10.5)
nRBC: 0 % (ref 0.0–0.2)

## 2019-07-15 LAB — COMPREHENSIVE METABOLIC PANEL
ALT: 17 U/L (ref 0–44)
AST: 21 U/L (ref 15–41)
Albumin: 2.5 g/dL — ABNORMAL LOW (ref 3.5–5.0)
Alkaline Phosphatase: 64 U/L (ref 38–126)
Anion gap: 7 (ref 5–15)
BUN: 24 mg/dL — ABNORMAL HIGH (ref 8–23)
CO2: 26 mmol/L (ref 22–32)
Calcium: 8.1 mg/dL — ABNORMAL LOW (ref 8.9–10.3)
Chloride: 106 mmol/L (ref 98–111)
Creatinine, Ser: 2.55 mg/dL — ABNORMAL HIGH (ref 0.61–1.24)
GFR calc Af Amer: 27 mL/min — ABNORMAL LOW (ref >60)
GFR calc non Af Amer: 23 mL/min — ABNORMAL LOW (ref >60)
Glucose, Bld: 175 mg/dL — ABNORMAL HIGH (ref 70–99)
Potassium: 4.2 mmol/L (ref 3.5–5.1)
Sodium: 139 mmol/L (ref 135–145)
Total Bilirubin: 0.4 mg/dL (ref 0.3–1.2)
Total Protein: 4.7 g/dL — ABNORMAL LOW (ref 6.5–8.1)

## 2019-07-15 LAB — GLUCOSE, CAPILLARY
Glucose-Capillary: 171 mg/dL — ABNORMAL HIGH (ref 70–99)
Glucose-Capillary: 216 mg/dL — ABNORMAL HIGH (ref 70–99)
Glucose-Capillary: 170 mg/dL — ABNORMAL HIGH (ref 70–99)
Glucose-Capillary: 180 mg/dL — ABNORMAL HIGH (ref 70–99)

## 2019-07-15 LAB — COOXEMETRY PANEL
Carboxyhemoglobin: 1 % (ref 0.5–1.5)
Methemoglobin: 0.7 % (ref 0.0–1.5)
O2 Saturation: 54.9 %
Total hemoglobin: 9.4 g/dL — ABNORMAL LOW (ref 12.0–16.0)

## 2019-07-15 LAB — MAGNESIUM: Magnesium: 1.8 mg/dL (ref 1.7–2.4)

## 2019-07-15 MED ORDER — FUROSEMIDE 10 MG/ML IJ SOLN
40.0000 mg | Freq: Three times a day (TID) | INTRAMUSCULAR | Status: AC
  Administered 2019-07-15 (×2): 40 mg via INTRAVENOUS
  Filled 2019-07-15 (×2): qty 4

## 2019-07-15 MED ORDER — MAGNESIUM SULFATE 2 GM/50ML IV SOLN
2.0000 g | Freq: Once | INTRAVENOUS | Status: AC
  Administered 2019-07-15: 2 g via INTRAVENOUS

## 2019-07-15 MED ORDER — AMIODARONE HCL 200 MG PO TABS
400.0000 mg | ORAL_TABLET | Freq: Two times a day (BID) | ORAL | Status: DC
  Administered 2019-07-15 – 2019-07-20 (×11): 400 mg via ORAL
  Filled 2019-07-15 (×11): qty 2

## 2019-07-15 MED ORDER — METOPROLOL TARTRATE 12.5 MG HALF TABLET
12.5000 mg | ORAL_TABLET | Freq: Two times a day (BID) | ORAL | Status: DC
  Administered 2019-07-15 – 2019-07-20 (×11): 12.5 mg via ORAL
  Filled 2019-07-15 (×11): qty 1

## 2019-07-15 MED ORDER — TAMSULOSIN HCL 0.4 MG PO CAPS
0.8000 mg | ORAL_CAPSULE | Freq: Every day | ORAL | Status: DC
  Administered 2019-07-15 – 2019-07-19 (×5): 0.8 mg via ORAL
  Filled 2019-07-15 (×5): qty 2

## 2019-07-15 MED FILL — Potassium Chloride Inj 2 mEq/ML: INTRAVENOUS | Qty: 40 | Status: AC

## 2019-07-15 MED FILL — Magnesium Sulfate Inj 50%: INTRAMUSCULAR | Qty: 10 | Status: AC

## 2019-07-15 MED FILL — Heparin Sodium (Porcine) Inj 1000 Unit/ML: INTRAMUSCULAR | Qty: 30 | Status: AC

## 2019-07-15 NOTE — Progress Notes (Signed)
3 Days Post-Op Procedure(s) (LRB): CORONARY ARTERY BYPASS GRAFTING (CABG), ON PUMP, TIMES FOUR , USING LEFT INTERNAL MAMMARY ARTERY AND ENDOSCOPICALLY HARVESTED BILATERAL GREATER SAPHENOUS VEINS (N/A) TRANSESOPHAGEAL ECHOCARDIOGRAM (TEE) (N/A) Insertion Central Line Adult (Right) Subjective: NSR this am Creat sl up to 2.5 but good urine output Wean off milrinone Start home dose of flomax and DC foley tomorrow Keep in ICU for renal. pulmonary monitoring Objective: Vital signs in last 24 hours: Temp:  [98.2 F (36.8 C)-98.6 F (37 C)] 98.6 F (37 C) (03/05 0816) Pulse Rate:  [91-110] 110 (03/05 0700) Cardiac Rhythm: Normal sinus rhythm (03/05 0400) Resp:  [10-24] 24 (03/05 0700) BP: (104-156)/(58-130) 144/82 (03/05 0700) SpO2:  [91 %-98 %] 93 % (03/05 0700) Weight:  [123.9 kg] 123.9 kg (03/05 0500)  Hemodynamic parameters for last 24 hours: CVP:  [9 mmHg-13 mmHg] 13 mmHg  Intake/Output from previous day: 03/04 0701 - 03/05 0700 In: 1597.7 [P.O.:440; I.V.:1057.7; IV Piggyback:100] Out: 2505 [Urine:2415; Chest Tube:90] Intake/Output this shift: No intake/output data recorded.       Exam    General- alert and comfortable    Neck- no JVD, no cervical adenopathy palpable, no carotid bruit   Lungs- clear without rales, wheezes   Cor- regular rate and rhythm, no murmur , gallop   Abdomen- soft, non-tender   Extremities - warm, non-tender, minimal edema   Neuro- oriented, appropriate, no focal weakness   Lab Results: Recent Labs    07/14/19 0423 07/15/19 0531  WBC 12.1* 8.2  HGB 9.6* 9.3*  HCT 29.6* 28.8*  PLT 83* 75*   BMET:  Recent Labs    07/14/19 1500 07/15/19 0531  NA 139 139  K 4.3 4.2  CL 107 106  CO2 23 26  GLUCOSE 205* 175*  BUN 24* 24*  CREATININE 2.55* 2.55*  CALCIUM 7.8* 8.1*    PT/INR:  Recent Labs    07/12/19 1555  LABPROT 18.5*  INR 1.6*   ABG    Component Value Date/Time   PHART 7.373 07/13/2019 0349   HCO3 21.8 07/13/2019 0349   TCO2 23 07/13/2019 0349   ACIDBASEDEF 3.0 (H) 07/13/2019 0349   O2SAT 54.9 07/15/2019 0650   CBG (last 3)  Recent Labs    07/14/19 1553 07/14/19 2224 07/15/19 0651  GLUCAP 187* 196* 171*    Assessment/Plan: S/P Procedure(s) (LRB): CORONARY ARTERY BYPASS GRAFTING (CABG), ON PUMP, TIMES FOUR , USING LEFT INTERNAL MAMMARY ARTERY AND ENDOSCOPICALLY HARVESTED BILATERAL GREATER SAPHENOUS VEINS (N/A) TRANSESOPHAGEAL ECHOCARDIOGRAM (TEE) (N/A) Insertion Central Line Adult (Right) Mobilize Diuresis Diabetes control start flowmax and DC foley tomorrow   LOS: 3 days    Clinton Ortiz 07/15/2019

## 2019-07-15 NOTE — Progress Notes (Signed)
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 Pt now s/p CABG x4 on 07/12/2019.    PT Comments    Pt tolerated treatment well, ambulating for much increased distance, maintaining sternal precautions with fewer cues, and requiring reduced assistance with bed mobility. Pt continues to make significant progress towards goals and will benefit from continued aggressive mobilization to push toward restoring independent mobility.   Follow Up Recommendations  Home health PT;Supervision/Assistance - 24 hour     Equipment Recommendations  (pt owns necessary DME)    Recommendations for Other Services       Precautions / Restrictions Precautions Precautions: Fall;Sternal Precaution Booklet Issued: No Precaution Comments: verbally reviewed sternal Restrictions Weight Bearing Restrictions: No Other Position/Activity Restrictions: sternal precautions    Mobility  Bed Mobility Overal bed mobility: Needs Assistance Bed Mobility: Supine to Sit;Sit to Supine     Supine to sit: Supervision Sit to supine: Supervision      Transfers Overall transfer level: Needs assistance Equipment used: Rolling walker (2 wheeled) Transfers: Sit to/from Stand Sit to Stand: Min guard            Ambulation/Gait Ambulation/Gait assistance: Min guard Gait Distance (Feet): 200 Feet Assistive device: Rolling walker (2 wheeled) Gait Pattern/deviations: Step-through pattern Gait velocity: reduced Gait velocity interpretation: <1.8 ft/sec, indicate of risk for recurrent falls General Gait Details: pt with slowed step through gait, no balance deviations  noted   Stairs             Wheelchair Mobility    Modified Rankin (Stroke Patients Only)       Balance Overall balance assessment: Needs assistance Sitting-balance support: No upper extremity supported;Feet supported Sitting balance-Leahy Scale: Good Sitting balance - Comments: supervision   Standing balance support: Bilateral upper extremity supported Standing balance-Leahy Scale: Good Standing balance comment: supervision                            Cognition Arousal/Alertness: Awake/alert Behavior During Therapy: WFL for tasks assessed/performed Overall Cognitive Status: Within Functional Limits for tasks assessed                                        Exercises      General Comments General comments (skin integrity, edema, etc.): VSS on RA      Pertinent Vitals/Pain Pain Assessment: No/denies pain    Home Living                      Prior Function            PT Goals (current goals can now be found in the care plan section) Acute Rehab PT Goals Patient Stated Goal: To return to independent mobility Progress towards PT goals: Progressing toward goals    Frequency    Min 3X/week      PT Plan Current plan remains appropriate    Co-evaluation              AM-PAC PT "6 Clicks" Mobility   Outcome Measure  Help needed turning from your back to your side while in  a flat bed without using bedrails?: A Little Help needed moving from lying on your back to sitting on the side of a flat bed without using bedrails?: A Little Help needed moving to and from a bed to a chair (including a wheelchair)?: A Little Help needed standing up from a chair using your arms (e.g., wheelchair or bedside chair)?: A Little Help needed to walk in hospital room?: A Little Help needed climbing 3-5 steps with a railing? : A Lot 6 Click Score: 17    End of Session   Activity Tolerance: Patient tolerated treatment well Patient  left: in bed;with call bell/phone within reach;with family/visitor present Nurse Communication: Mobility status PT Visit Diagnosis: Muscle weakness (generalized) (M62.81);Other abnormalities of gait and mobility (R26.89)     Time: 7681-1572 PT Time Calculation (min) (ACUTE ONLY): 16 min  Charges:  $Gait Training: 8-22 mins                     Zenaida Niece, PT, DPT Acute Rehabilitation Pager: [phone]    Zenaida Niece 07/15/2019, 5:38 PM

## 2019-07-15 NOTE — Progress Notes (Signed)
[address]       [place] [address]             [phone]      3 Days Post-Op Procedure(s) (LRB): CORONARY ARTERY BYPASS GRAFTING (CABG), ON PUMP, TIMES FOUR , USING LEFT INTERNAL MAMMARY ARTERY AND ENDOSCOPICALLY HARVESTED BILATERAL GREATER SAPHENOUS VEINS (N/A) TRANSESOPHAGEAL ECHOCARDIOGRAM (TEE) (N/A) Insertion Central Line Adult (Right) Subjective: conts to feel better, slowly getting stronger  Objective: Vital signs in last 24 hours: Temp:  [98.2 F (36.8 C)-98.5 F (36.9 C)] 98.2 F (36.8 C) (03/05 0400) Pulse Rate:  [91-110] 110 (03/05 0700) Cardiac Rhythm: Normal sinus rhythm (03/05 0400) Resp:  [10-24] 24 (03/05 0700) BP: (104-156)/(58-130) 144/82 (03/05 0700) SpO2:  [91 %-98 %] 93 % (03/05 0700) Weight:  [123.9 kg] 123.9 kg (03/05 0500)  Hemodynamic parameters for last 24 hours: CVP:  [9 mmHg-13 mmHg] 13 mmHg  Intake/Output from previous day: 03/04 0701 - 03/05 0700 In: 1597.7 [P.O.:440; I.V.:1057.7; IV Piggyback:100] Out: 2505 [Urine:2415; Chest Tube:90] Intake/Output this shift: No intake/output data recorded.  General appearance: alert, cooperative and no distress Heart: regular rate and rhythm Lungs: clear to auscultation bilaterally Abdomen: obese, soft, non-distended or tender Extremities: + edema Wound: incis (evh) healing well, chest dressing in place  Lab Results: Recent Labs    07/14/19 0423 07/15/19 0531  WBC 12.1* 8.2  HGB 9.6* 9.3*  HCT 29.6* 28.8*  PLT 83* 75*   BMET:  Recent Labs    07/14/19 0423 07/14/19 1500  NA 140 139  K 4.3 4.3  CL 110 107  CO2 23 23  GLUCOSE 219* 205*  BUN 22 24*  CREATININE 2.40* 2.55*  CALCIUM 7.7* 7.8*    PT/INR:  Recent Labs    07/12/19 1555  LABPROT 18.5*  INR 1.6*   ABG    Component Value Date/Time   PHART 7.373 07/13/2019 0349   HCO3 21.8 07/13/2019 0349   TCO2 23 07/13/2019 0349   ACIDBASEDEF 3.0 (H) 07/13/2019 0349   O2SAT 54.9 07/15/2019 0650   CBG  (last 3)  Recent Labs    07/14/19 1553 07/14/19 2224 07/15/19 0651  GLUCAP 187* 196* 171*    Meds Scheduled Meds: . sodium chloride   Intravenous Once  . acetaminophen  1,000 mg Oral Q6H   Or  . acetaminophen (TYLENOL) oral liquid 160 mg/5 mL  1,000 mg Per Tube Q6H  . amiodarone  400 mg Oral BID  . aspirin EC  325 mg Oral Daily   Or  . aspirin  324 mg Per Tube Daily  . atorvastatin  80 mg Oral q1800  . bisacodyl  10 mg Oral Daily   Or  . bisacodyl  10 mg Rectal Daily  . Chlorhexidine Gluconate Cloth  6 each Topical Daily  . docusate sodium  200 mg Oral Daily  . enoxaparin (LOVENOX) injection  40 mg Subcutaneous Q24H  . gabapentin  300 mg Oral QHS  . guaiFENesin  600 mg Oral BID  . insulin aspart  0-24 Units Subcutaneous TID WC & HS  . insulin aspart  4 Units Subcutaneous TID WC  . insulin detemir  24 Units Subcutaneous BID  . magnesium oxide  400 mg Oral BID  . mouth rinse  15 mL Mouth Rinse BID  . mometasone-formoterol  2 puff Inhalation BID  . pantoprazole  40 mg Oral Daily  . sodium chloride flush  10-40 mL Intracatheter Q12H  . sodium chloride flush  3  mL Intravenous Q12H   Continuous Infusions: . sodium chloride Stopped (07/13/19 0954)  . sodium chloride    . sodium chloride 20 mL/hr at 07/12/19 1540  . lactated ringers    . lactated ringers 20 mL/hr at 07/15/19 0700  . magnesium sulfate    . milrinone 0.125 mcg/kg/min (07/15/19 0700)  . norepinephrine Stopped (07/14/19 1048)   PRN Meds:.sodium chloride, albuterol, dextrose, metoprolol tartrate, midazolam, morphine injection, ondansetron (ZOFRAN) IV, oxyCODONE, sodium chloride flush, sodium chloride flush  Xrays DG Chest Port 1 View  Result Date: 07/14/2019 CLINICAL DATA:  Chest tube, post open cardiac surgery EXAM: PORTABLE CHEST 1 VIEW COMPARISON:  Chest radiograph from one day prior. FINDINGS: Right internal jugular central venous sheath with removal of Swan-Ganz catheter. Right subclavian central venous  catheter terminates in the lower third of the SVC. Intact sternotomy wires. Midline mediastinal drain and left chest tube are stable. Stable cardiomediastinal silhouette with mild cardiomegaly. No pneumothorax. Low lung volumes. Stable small bilateral pleural effusions. No overt pulmonary edema. Mild bibasilar atelectasis and mild left mid lung platelike atelectasis are unchanged. IMPRESSION: 1. No pneumothorax. 2. Stable small bilateral pleural effusions and mild bibasilar and left mid lung platelike atelectasis. 3. Stable mild cardiomegaly without overt pulmonary edema. Electronically Signed   By: Ilona Sorrel M.D.   On: 07/14/2019 09:10    Assessment/Plan: S/P Procedure(s) (LRB): CORONARY ARTERY BYPASS GRAFTING (CABG), ON PUMP, TIMES FOUR , USING LEFT INTERNAL MAMMARY ARTERY AND ENDOSCOPICALLY HARVESTED BILATERAL GREATER SAPHENOUS VEINS (N/A) TRANSESOPHAGEAL ECHOCARDIOGRAM (TEE) (N/A) Insertion Central Line Adult (Right) POD#3 1 stable overall , remains on milrinone- co-oc is 54, wean as able. BP control is pretty good, monitor closely 2 sinus rhythm, convert to po amiodarone 3 sats good on 2 liters 4 BUN/Creat stable from previous reading 24/2.55, remains volume overloaded with significant periph edema- will need further diuresis 5 platelet count conts to lower- may need to consider HIT panel 6 BS fair control- BS with poor control for several years. Will need to transition to home meds and will need better long term management.Hopefully can lose some weight.  7 H/H fairly stable  LOS: 3 days    Kacper Giovanni PA-C Pager [phone] 07/15/2019

## 2019-07-15 NOTE — Progress Notes (Signed)
CT surgery p.m. Rounds  More comfortable today Maintaining sinus rhythm on oral amiodarone Urine output adequate, creatinine plateaued at 2.5 We will DC Foley in a.m. and most likely transfer to stepdown progressive care Plan of care discussed with patient and daughter at bedside

## 2019-07-16 ENCOUNTER — Inpatient Hospital Stay (HOSPITAL_COMMUNITY): Payer: Medicare Other

## 2019-07-16 LAB — GLUCOSE, CAPILLARY
Glucose-Capillary: 143 mg/dL — ABNORMAL HIGH (ref 70–99)
Glucose-Capillary: 173 mg/dL — ABNORMAL HIGH (ref 70–99)
Glucose-Capillary: 206 mg/dL — ABNORMAL HIGH (ref 70–99)
Glucose-Capillary: 115 mg/dL — ABNORMAL HIGH (ref 70–99)

## 2019-07-16 LAB — BPAM RBC
Blood Product Expiration Date: 202103272359
Blood Product Expiration Date: 202103292359
Blood Product Expiration Date: 202103292359
Blood Product Expiration Date: 202104012359
Blood Product Expiration Date: 202104012359
Blood Product Expiration Date: 202104012359
ISSUE DATE / TIME: 202103021504
ISSUE DATE / TIME: 202103021504
ISSUE DATE / TIME: 202103021624
ISSUE DATE / TIME: 202103041830
ISSUE DATE / TIME: 202103050753
Unit Type and Rh: 5100
Unit Type and Rh: 5100
Unit Type and Rh: 5100
Unit Type and Rh: 6200
Unit Type and Rh: 6200
Unit Type and Rh: 6200

## 2019-07-16 LAB — TYPE AND SCREEN
ABO/RH(D): A POS
Antibody Screen: NEGATIVE
Unit division: 0
Unit division: 0
Unit division: 0
Unit division: 0
Unit division: 0
Unit division: 0

## 2019-07-16 LAB — COMPREHENSIVE METABOLIC PANEL
ALT: 18 U/L (ref 0–44)
AST: 20 U/L (ref 15–41)
Albumin: 2.4 g/dL — ABNORMAL LOW (ref 3.5–5.0)
Alkaline Phosphatase: 55 U/L (ref 38–126)
Anion gap: 11 (ref 5–15)
BUN: 31 mg/dL — ABNORMAL HIGH (ref 8–23)
CO2: 28 mmol/L (ref 22–32)
Calcium: 8.1 mg/dL — ABNORMAL LOW (ref 8.9–10.3)
Chloride: 101 mmol/L (ref 98–111)
Creatinine, Ser: 2.58 mg/dL — ABNORMAL HIGH (ref 0.61–1.24)
GFR calc Af Amer: 27 mL/min — ABNORMAL LOW (ref >60)
GFR calc non Af Amer: 23 mL/min — ABNORMAL LOW (ref >60)
Glucose, Bld: 153 mg/dL — ABNORMAL HIGH (ref 70–99)
Potassium: 3.9 mmol/L (ref 3.5–5.1)
Sodium: 140 mmol/L (ref 135–145)
Total Bilirubin: 1.2 mg/dL (ref 0.3–1.2)
Total Protein: 4.9 g/dL — ABNORMAL LOW (ref 6.5–8.1)

## 2019-07-16 LAB — CBC
HCT: 27.7 % — ABNORMAL LOW (ref 39.0–52.0)
Hemoglobin: 8.9 g/dL — ABNORMAL LOW (ref 13.0–17.0)
MCH: 32.1 pg (ref 26.0–34.0)
MCHC: 32.1 g/dL (ref 30.0–36.0)
MCV: 100 fL (ref 80.0–100.0)
Platelets: 92 10*3/uL — ABNORMAL LOW (ref 150–400)
RBC: 2.77 MIL/uL — ABNORMAL LOW (ref 4.22–5.81)
RDW: 13.9 % (ref 11.5–15.5)
WBC: 6.2 10*3/uL (ref 4.0–10.5)
nRBC: 0 % (ref 0.0–0.2)

## 2019-07-16 MED ORDER — SORBITOL 70 % SOLN
60.0000 mL | Freq: Once | Status: AC
  Administered 2019-07-16: 60 mL via ORAL
  Filled 2019-07-16: qty 60

## 2019-07-16 MED ORDER — SORBITOL 70 % PO SOLN: 60.0000 mL | Freq: Once | ORAL | Status: DC

## 2019-07-16 MED ORDER — FUROSEMIDE 10 MG/ML IJ SOLN
40.0000 mg | Freq: Every day | INTRAMUSCULAR | Status: AC
  Administered 2019-07-16 – 2019-07-17 (×2): 40 mg via INTRAVENOUS
  Filled 2019-07-16 (×2): qty 4

## 2019-07-16 MED ORDER — METOLAZONE 5 MG PO TABS
5.0000 mg | ORAL_TABLET | Freq: Every day | ORAL | Status: AC
  Administered 2019-07-16 – 2019-07-17 (×2): 5 mg via ORAL
  Filled 2019-07-16 (×2): qty 1

## 2019-07-16 MED ORDER — CLOPIDOGREL BISULFATE 75 MG PO TABS
75.0000 mg | ORAL_TABLET | Freq: Every day | ORAL | Status: DC
  Administered 2019-07-17 – 2019-07-20 (×4): 75 mg via ORAL
  Filled 2019-07-16 (×4): qty 1

## 2019-07-16 MED ORDER — ASPIRIN EC 81 MG PO TBEC
81.0000 mg | DELAYED_RELEASE_TABLET | Freq: Every day | ORAL | Status: DC
  Administered 2019-07-17 – 2019-07-20 (×4): 81 mg via ORAL
  Filled 2019-07-16 (×4): qty 1

## 2019-07-16 NOTE — Plan of Care (Signed)

## 2019-07-16 NOTE — Progress Notes (Addendum)
Pt admitted today from [facility].  Pt placed on telemetry and CCMD notified.  CHG bath complete.  Vitals and assessment complete. Pt is neuro intact and A&O X 4.  Pt is currently stable with no abnormalities to report at this time.  Pt will continue to be monitored.

## 2019-07-16 NOTE — Progress Notes (Signed)
4 Days Post-Op Procedure(s) (LRB): CORONARY ARTERY BYPASS GRAFTING (CABG), ON PUMP, TIMES FOUR , USING LEFT INTERNAL MAMMARY ARTERY AND ENDOSCOPICALLY HARVESTED BILATERAL GREATER SAPHENOUS VEINS (N/A) TRANSESOPHAGEAL ECHOCARDIOGRAM (TEE) (N/A) Insertion Central Line Adult (Right) Subjective: Doing well Creatinine plateaued at 2.5 nsr tx to stepdown Objective: Vital signs in last 24 hours: Temp:  [97.7 F (36.5 C)-98.7 F (37.1 C)] 98.4 F (36.9 C) (03/06 0801) Pulse Rate:  [75-104] 78 (03/06 0900) Cardiac Rhythm: Normal sinus rhythm (03/06 0820) Resp:  [13-28] 13 (03/06 0900) BP: (94-138)/(47-81) 126/63 (03/06 0900) SpO2:  [87 %-96 %] 94 % (03/06 0900) Weight:  [120.7 kg] 120.7 kg (03/06 0500)  Hemodynamic parameters for last 24 hours:    Intake/Output from previous day: 03/05 0701 - 03/06 0700 In: 1662.7 [P.O.:1370; I.V.:292.7] Out: 3450 [Urine:3450] Intake/Output this shift: Total I/O In: 240 [P.O.:240] Out: 125 [Urine:125]       Exam    General- alert and comfortable    Neck- no JVD, no cervical adenopathy palpable, no carotid bruit   Lungs- clear without rales, wheezes   Cor- regular rate and rhythm, no murmur , gallop   Abdomen- soft, non-tender   Extremities - warm, non-tender, minimal edema   Neuro- oriented, appropriate, no focal weakness   Lab Results: Recent Labs    07/15/19 0531 07/16/19 0514  WBC 8.2 6.2  HGB 9.3* 8.9*  HCT 28.8* 27.7*  PLT 75* 92*   BMET:  Recent Labs    07/15/19 0531 07/16/19 0514  NA 139 140  K 4.2 3.9  CL 106 101  CO2 26 28  GLUCOSE 175* 153*  BUN 24* 31*  CREATININE 2.55* 2.58*  CALCIUM 8.1* 8.1*    PT/INR: No results for input(s): LABPROT, INR in the last 72 hours. ABG    Component Value Date/Time   PHART 7.373 07/13/2019 0349   HCO3 21.8 07/13/2019 0349   TCO2 23 07/13/2019 0349   ACIDBASEDEF 3.0 (H) 07/13/2019 0349   O2SAT 54.9 07/15/2019 0650   CBG (last 3)  Recent Labs    07/15/19 1541  07/15/19 1939 07/16/19 0601  GLUCAP 170* 180* 143*    Assessment/Plan: S/P Procedure(s) (LRB): CORONARY ARTERY BYPASS GRAFTING (CABG), ON PUMP, TIMES FOUR , USING LEFT INTERNAL MAMMARY ARTERY AND ENDOSCOPICALLY HARVESTED BILATERAL GREATER SAPHENOUS VEINS (N/A) TRANSESOPHAGEAL ECHOCARDIOGRAM (TEE) (N/A) Insertion Central Line Adult (Right) Mobilize Diuresis Plan for transfer to step-down: see transfer orders DC central line and foley   LOS: 4 days    Clinton Ortiz 07/16/2019

## 2019-07-16 NOTE — Progress Notes (Signed)
This RN called to the bedside that patient was on the floor, upon arrival to the room patient was found on the floor, he stated that I felt weak and went down on my knees on his way from the bathroom, patient denied hitting his head on the floor or falling, this was  confirmed by NT that was with pt when pt went down. Pt denies pain BP 146/84 HR 86 R 15, O2 98% on RA, no sign of distress noted,skin tear on bilateral knee,call bell within reach,  will continue to monitor

## 2019-07-17 ENCOUNTER — Inpatient Hospital Stay (HOSPITAL_COMMUNITY): Payer: Medicare Other

## 2019-07-17 LAB — GLUCOSE, CAPILLARY
Glucose-Capillary: 148 mg/dL — ABNORMAL HIGH (ref 70–99)
Glucose-Capillary: 202 mg/dL — ABNORMAL HIGH (ref 70–99)
Glucose-Capillary: 87 mg/dL (ref 70–99)
Glucose-Capillary: 150 mg/dL — ABNORMAL HIGH (ref 70–99)

## 2019-07-17 LAB — CBC
HCT: 30 % — ABNORMAL LOW (ref 39.0–52.0)
Hemoglobin: 9.7 g/dL — ABNORMAL LOW (ref 13.0–17.0)
MCH: 31.6 pg (ref 26.0–34.0)
MCHC: 32.3 g/dL (ref 30.0–36.0)
MCV: 97.7 fL (ref 80.0–100.0)
Platelets: 120 10*3/uL — ABNORMAL LOW (ref 150–400)
RBC: 3.07 MIL/uL — ABNORMAL LOW (ref 4.22–5.81)
RDW: 13.6 % (ref 11.5–15.5)
WBC: 7 10*3/uL (ref 4.0–10.5)
nRBC: 0 % (ref 0.0–0.2)

## 2019-07-17 LAB — COMPREHENSIVE METABOLIC PANEL
ALT: 25 U/L (ref 0–44)
AST: 31 U/L (ref 15–41)
Albumin: 2.6 g/dL — ABNORMAL LOW (ref 3.5–5.0)
Alkaline Phosphatase: 64 U/L (ref 38–126)
Anion gap: 13 (ref 5–15)
BUN: 43 mg/dL — ABNORMAL HIGH (ref 8–23)
CO2: 26 mmol/L (ref 22–32)
Calcium: 8.3 mg/dL — ABNORMAL LOW (ref 8.9–10.3)
Chloride: 100 mmol/L (ref 98–111)
Creatinine, Ser: 2.57 mg/dL — ABNORMAL HIGH (ref 0.61–1.24)
GFR calc Af Amer: 27 mL/min — ABNORMAL LOW (ref >60)
GFR calc non Af Amer: 23 mL/min — ABNORMAL LOW (ref >60)
Glucose, Bld: 142 mg/dL — ABNORMAL HIGH (ref 70–99)
Potassium: 4 mmol/L (ref 3.5–5.1)
Sodium: 139 mmol/L (ref 135–145)
Total Bilirubin: 0.9 mg/dL (ref 0.3–1.2)
Total Protein: 5.5 g/dL — ABNORMAL LOW (ref 6.5–8.1)

## 2019-07-17 NOTE — Progress Notes (Signed)
Pt's epicardial pacing wires removed today per MD order.  Pt tolerated well.  No complications to report at this time. Pt placed on 1hr bedrest with Q15 vitals per protocol.  Pt will continue to be monitored.

## 2019-07-17 NOTE — TOC Initial Note (Signed)
Transition of Care (TOC) - Initial/Assessment Note  Marvetta Gibbons RN, BSN Transitions of Care Unit 4E- RN Case Manager [phone]   Patient Details  Name: Clinton Ortiz MRN: [id number] Date of Birth: October 27, 1940  Transition of Care [facility]) CM/SW Contact:    Dawayne Patricia, RN Phone Number: 07/17/2019, 3:54 PM  Clinical Narrative:                 Pt s/p CABG from home, order placed for HHPT, Cm spoke with pt at bedside- per pt his daughter will assist post discharge at home and will provide transport home. Pt reports he has a walker at home. List provided for [facility] choice Per CMS guidelines from medicare.gov website with star ratings (copy placed in shadow chart),  Pt states he has used [facility] in past for wife- would like to use them again as first choice- will call Butch Penny with [facility] in am for [facility] referral.   Expected Discharge Plan: [place] Barriers to Discharge: Continued Medical Work up   Patient Goals and CMS Choice Patient states their goals for this hospitalization and ongoing recovery are:: get up and around and get moving CMS Medicare.gov Compare Post Acute Care list provided to:: Patient Choice offered to / list presented to : Patient  Expected Discharge Plan and Services Expected Discharge Plan: [place] In-house Referral: (walker) Discharge Planning Services: CM Consult Post Acute Care Choice: [place] arrangements for the past 2 months: Single Family Home                 DME Arranged: N/A         HH Arranged: PT HH Agency: [place] ([place]) Date HH Agency Contacted: 07/18/19      Prior Living Arrangements/Services Living arrangements for the past 2 months: Single Family Home Lives with:: Adult Children Patient language and need for interpreter reviewed:: Yes Do you feel safe going back to the place where you live?: Yes      Need for Family Participation in Patient Care: Yes (Comment) Care giver support  system in place?: Yes (comment) Current home services: DME Criminal Activity/Legal Involvement Pertinent to Current Situation/Hospitalization: No - Comment as needed  Activities of Daily Living      Permission Sought/Granted Permission sought to share information with : Chartered certified accountant granted to share information with : Yes, Verbal Permission Granted     Permission granted to share info w AGENCY: [place] agency        Emotional Assessment Appearance:: Appears stated age Attitude/Demeanor/Rapport: Engaged Affect (typically observed): Appropriate Orientation: : Oriented to Place, Oriented to Self, Oriented to  Time, Oriented to Situation Alcohol / Substance Use: Not Applicable Psych Involvement: No (comment)  Admission diagnosis:  S/P CABG x 4 [Z95.1] Patient Active Problem List   Diagnosis Date Noted  . S/P CABG x 4 07/12/2019  . Left main coronary artery disease 07/01/2019  . Type 1 diabetes mellitus without complication ([place])   . Abnormal stress test 06/30/2019  . Right foot injury 06/15/2019  . Chest pain at rest 05/18/2019  . Subclinical hypothyroidism 10/20/2018  . Plantar fasciitis, bilateral 05/07/2018  . Fall from slip, trip, or stumble 11/05/2017  . Sepsis due to urinary tract infection ([place]) 06/22/2017  . CKD stage 3 due to type 2 diabetes mellitus ([place]) 06/05/2017  . LPRD (laryngopharyngeal reflux disease) 06/04/2017  . DOE (dyspnea on exertion) 09/08/2016  . Mass of right side of neck 09/13/2014  .  COPD (chronic obstructive pulmonary disease) ([place]) 06/01/2014  . Controlled type 2 diabetes mellitus with diabetic nephropathy ([place]) 12/30/2013  . BPH (benign prostatic hypertrophy) 12/28/2013  . Morbid obesity ([place]) 12/28/2013  . Annual physical exam 02/14/2013  . Hypertension 09/02/2012  . Hyperlipidemia 09/02/2012  . History of renal cancer status post left nephrectomy 09/02/2012  . Swelling, scrotum 09/02/2012  . Lumbar degenerative  disc disease 12/29/2011   PCP:  Silverio Decamp, MD Pharmacy:   [id number] - [place], [place] [address] Phone: [phone] Fax: [phone]     Social Determinants of Health (SDOH) Interventions    Readmission Risk Interventions No flowsheet data found.

## 2019-07-17 NOTE — Progress Notes (Addendum)
      [address]       [place] [address]             [phone]      5 Days Post-Op Procedure(s) (LRB): CORONARY ARTERY BYPASS GRAFTING (CABG), ON PUMP, TIMES FOUR , USING LEFT INTERNAL MAMMARY ARTERY AND ENDOSCOPICALLY HARVESTED BILATERAL GREATER SAPHENOUS VEINS (N/A) TRANSESOPHAGEAL ECHOCARDIOGRAM (TEE) (N/A) Insertion Central Line Adult (Right)   Subjective:  Doing okay.  He did go down on his knees yesterday after going to the bathroom.  The patient states he got up and was walking back to his bed when he just felt weak and a little light headed so he went to his knees so he didn't fall.  + ambulation  + BM  Objective: Vital signs in last 24 hours: Temp:  [97.7 F (36.5 C)-98.5 F (36.9 C)] 97.7 F (36.5 C) (03/07 0357) Pulse Rate:  [74-89] 76 (03/07 0357) Cardiac Rhythm: Normal sinus rhythm (03/06 1900) Resp:  [13-20] 16 (03/07 0357) BP: (121-146)/(63-84) 132/69 (03/07 0357) SpO2:  [91 %-100 %] 97 % (03/07 0357) Weight:  [119.3 kg] 119.3 kg (03/07 0357)  Intake/Output from previous day: 03/06 0701 - 03/07 0700 In: 1200 [P.O.:1200] Out: 1105 [Urine:1105]  General appearance: alert, cooperative and no distress Heart: regular rate and rhythm Lungs: clear to auscultation bilaterally Abdomen: soft, non-tender; bowel sounds normal; no masses,  no organomegaly Extremities: edema trace Wound: clean and dry  Lab Results: Recent Labs    07/16/19 0514 07/17/19 0241  WBC 6.2 7.0  HGB 8.9* 9.7*  HCT 27.7* 30.0*  PLT 92* 120*   BMET:  Recent Labs    07/16/19 0514 07/17/19 0241  NA 140 139  K 3.9 4.0  CL 101 100  CO2 28 26  GLUCOSE 153* 142*  BUN 31* 43*  CREATININE 2.58* 2.57*  CALCIUM 8.1* 8.3*    PT/INR: No results for input(s): LABPROT, INR in the last 72 hours. ABG    Component Value Date/Time   PHART 7.373 07/13/2019 0349   HCO3 21.8 07/13/2019 0349   TCO2 23 07/13/2019 0349   ACIDBASEDEF 3.0 (H) 07/13/2019 0349   O2SAT 54.9  07/15/2019 0650   CBG (last 3)  Recent Labs    07/16/19 1605 07/16/19 2118 07/17/19 0625  GLUCAP 206* 115* 148*    Assessment/Plan: S/P Procedure(s) (LRB): CORONARY ARTERY BYPASS GRAFTING (CABG), ON PUMP, TIMES FOUR , USING LEFT INTERNAL MAMMARY ARTERY AND ENDOSCOPICALLY HARVESTED BILATERAL GREATER SAPHENOUS VEINS (N/A) TRANSESOPHAGEAL ECHOCARDIOGRAM (TEE) (N/A) Insertion Central Line Adult (Right)  1. CV- NSR, BP elevated at times- however with elevated creatinine BP level is acceptable, continue Lopressor at current dose, will d/c EPW today 2. Pulm- no acute issues, off oxygen 3. Renal- creatinine remains stable at 2.5, his weight is trending down, on Zaroxolyn, Lasix  4. Expected post operative blood loss anemia, mild Hgb stable 5. DM- sugars okay continue current insulin regimen 6. Dispo- patient stable, maintaining NSR, BP elevated at times but okay with elevated creatinine, continue diuretics, patient is deconditioned has DME at home, have ordered H/H PT per recs... if patient continues to progress possibly ready for d/c in next 24-48 hours    LOS: 5 days    Ellwood Handler, PA-C  07/17/2019   Agree with above Some light-headedness Diuresing well Continue PT and pulm toilet  Chauncy Mangiaracina O Anaisa Radi

## 2019-07-18 LAB — COMPREHENSIVE METABOLIC PANEL
ALT: 25 U/L (ref 0–44)
AST: 29 U/L (ref 15–41)
Albumin: 2.6 g/dL — ABNORMAL LOW (ref 3.5–5.0)
Alkaline Phosphatase: 63 U/L (ref 38–126)
Anion gap: 15 (ref 5–15)
BUN: 49 mg/dL — ABNORMAL HIGH (ref 8–23)
CO2: 27 mmol/L (ref 22–32)
Calcium: 8.4 mg/dL — ABNORMAL LOW (ref 8.9–10.3)
Chloride: 95 mmol/L — ABNORMAL LOW (ref 98–111)
Creatinine, Ser: 2.6 mg/dL — ABNORMAL HIGH (ref 0.61–1.24)
GFR calc Af Amer: 26 mL/min — ABNORMAL LOW (ref >60)
GFR calc non Af Amer: 23 mL/min — ABNORMAL LOW (ref >60)
Glucose, Bld: 143 mg/dL — ABNORMAL HIGH (ref 70–99)
Potassium: 3.7 mmol/L (ref 3.5–5.1)
Sodium: 137 mmol/L (ref 135–145)
Total Bilirubin: 1 mg/dL (ref 0.3–1.2)
Total Protein: 5.6 g/dL — ABNORMAL LOW (ref 6.5–8.1)

## 2019-07-18 LAB — CBC
HCT: 28.6 % — ABNORMAL LOW (ref 39.0–52.0)
Hemoglobin: 9.6 g/dL — ABNORMAL LOW (ref 13.0–17.0)
MCH: 32 pg (ref 26.0–34.0)
MCHC: 33.6 g/dL (ref 30.0–36.0)
MCV: 95.3 fL (ref 80.0–100.0)
Platelets: 175 10*3/uL (ref 150–400)
RBC: 3 MIL/uL — ABNORMAL LOW (ref 4.22–5.81)
RDW: 13.3 % (ref 11.5–15.5)
WBC: 6.3 10*3/uL (ref 4.0–10.5)
nRBC: 0 % (ref 0.0–0.2)

## 2019-07-18 LAB — GLUCOSE, CAPILLARY
Glucose-Capillary: 147 mg/dL — ABNORMAL HIGH (ref 70–99)
Glucose-Capillary: 196 mg/dL — ABNORMAL HIGH (ref 70–99)
Glucose-Capillary: 145 mg/dL — ABNORMAL HIGH (ref 70–99)
Glucose-Capillary: 139 mg/dL — ABNORMAL HIGH (ref 70–99)

## 2019-07-18 MED ORDER — INSULIN STARTER KIT- PEN NEEDLES (ENGLISH)
1.0000 | Freq: Once | Status: AC
  Administered 2019-07-18: 1
  Filled 2019-07-18: qty 1

## 2019-07-18 MED FILL — Mannitol IV Soln 20%: INTRAVENOUS | Qty: 500 | Status: AC

## 2019-07-18 MED FILL — Heparin Sodium (Porcine) Inj 1000 Unit/ML: INTRAMUSCULAR | Qty: 10 | Status: AC

## 2019-07-18 MED FILL — Sodium Chloride IV Soln 0.9%: INTRAVENOUS | Qty: 1000 | Status: AC

## 2019-07-18 MED FILL — Albumin, Human Inj 5%: INTRAVENOUS | Qty: 250 | Status: AC

## 2019-07-18 MED FILL — Lidocaine HCl Local Soln Prefilled Syringe 100 MG/5ML (2%): INTRAMUSCULAR | Qty: 5 | Status: AC

## 2019-07-18 MED FILL — Electrolyte-R (PH 7.4) Solution: INTRAVENOUS | Qty: 3000 | Status: AC

## 2019-07-18 NOTE — TOC Progression Note (Signed)
Transition of Care (TOC) - Progression Note  Marvetta Gibbons RN, BSN Transitions of Care Unit 4E- RN Case Manager [phone]   Patient Details  Name: Clinton Ortiz MRN: [id number] Date of Birth: [birth year]  Transition of Care [facility]) CM/SW Contact  Dahlia Client, Romeo Rabon, RN Phone Number: 07/18/2019, 2:34 PM  Clinical Narrative:    Referral made to Butch Penny with [facility] for HHPT per pt choice- referral has been accepted- pt from home with spouse- daughter to assist also on discharge. No DME needs noted at this time.    Expected Discharge Plan: [place] Barriers to Discharge: Continued Medical Work up  Expected Discharge Plan and Services Expected Discharge Plan: [place] In-house Referral: (walker) Discharge Planning Services: CM Consult Post Acute Care Choice: [place] arrangements for the past 2 months: Single Family Home                 DME Arranged: N/A         HH Arranged: PT HH Agency: [place] ([place]) Date HH Agency Contacted: 07/18/19 Time HH Agency Contacted: 0900 Representative spoke with at [place]: [place] ([place]) Interventions    Readmission Risk Interventions No flowsheet data found.

## 2019-07-18 NOTE — Progress Notes (Addendum)
Inpatient Diabetes Program Recommendations  AACE/ADA: New Consensus Statement on Inpatient Glycemic Control   Target Ranges:  Prepandial:   less than 140 mg/dL      Peak postprandial:   less than 180 mg/dL (1-2 hours)      Critically ill patients:  140 - 180 mg/dL   Results for Clinton Ortiz, Clinton Ortiz (MRN [id number]) as of 07/18/2019 12:00  Ref. Range 07/17/2019 06:25 07/17/2019 11:25 07/17/2019 17:01 07/17/2019 21:47 07/18/2019 06:17 07/18/2019 11:53  Glucose-Capillary Latest Ref Range: 70 - 99 mg/dL 148 (H) 202 (H) 87 150 (H) 147 (H) 196 (H)  Results for [name], [name] (MRN [id number]) as of 07/18/2019 12:00  Ref. Range 04/13/2019 14:25 07/08/2019 13:57  Hemoglobin A1C Latest Ref Range: 4.8 - 5.6 % 9.2 (A) 9.7 (H)   Review of Glycemic Control  Diabetes history: DM2 Outpatient Diabetes medications: Trulicity 4.5 mg Qweek, Glipizide 10 mg BID Current orders for Inpatient glycemic control: Levemir 24 units BID, Novolog 0-24 units AC&HS, Novolog 4 units TID with meals  Inpatient Diabetes Program Recommendations:    HbgA1C:  A1C 9.7% on 07/08/19 indicating an average glucose of 232 mg/dl over the past 2-3 months. May want to discharge on similar insulin regimen as being used inpatient and have patient follow up with PCP. Patient is willing to use insulin outpatient if needed; if so please prescribe insulin pens.  NOTE: Spoke with patient about diabetes and home regimen for diabetes control. Patient reports being followed by PCP for diabetes management and currently taking Trulicity 4.5 mg Qweek and Glipizide 10 mg BID as an outpatient for diabetes control. Patient reports taking DM medications as prescribed and last seen his PCP on 06/15/19 at which time he was told to increase Trulicity from 3 mg to 4.5 mg Qweek.  Patient reports checking glucose 3 times per day and he notes that glucose has improved since increasing Trulicity dose but still ranging 150-200's mg/dl. Discussed A1C results (9.7% on 07/08/19 ) and explained that  current A1C indicates an average glucose of 232 mg/dl over the past 2-3 months. Discussed glucose and A1C goals. Discussed importance of checking CBGs and maintaining good CBG control to prevent long-term and short-term complications and especially following CABG. Explained how hyperglycemia leads to damage within blood vessels which lead to the common complications seen with uncontrolled diabetes. Stressed to the patient the importance of improving glycemic control to prevent further complications from uncontrolled diabetes. Discussed impact of nutrition, exercise, stress, sickness, and medications on diabetes control. Asked patient if he would be willing to use insulin at home for DM control and patient states he would use insulin at home if needed. Patient notes that he has never used insulin but his Trulicity is in a pen that he self injects so he is comfortable with giving himself an injection. Discussed current insulin regimen and explained that it may be best to use as an outpatient for DM control for now to ensure he heals well and has no complications from CABG surgery.  Educated patien on insulin pen use at home. Informed patient he would get an insulin flexpen starter kit from his nurse once pharmacy delivers to unit. Asked that he read over insulin starter kit. Reviewed all steps of insulin pen including attachment of needle, 2-unit air shot, dialing up dose, giving injection, removing needle, disposal of sharps, storage of unused insulin, disposal of insulin etc. Patient able to provide successful return demonstration. Discussed hypoglycemia along with treatment. Discussed Levemir and Novolog insulin and how they work.  Asked patient to check glucose 3-4 times a day and if glucose is consistently over 180 mg/dl or any glucose less than 70 mg/dl, advised to call his PCP to ask for advice on DM medication adjustments.  Patient verbalized understanding of information discussed and reports no further  questions at this time related to diabetes. If patient is discharged on insulin, please provide Rx for insulin pens and insulin pen needles.  Thanks, Barnie Alderman, RN, MSN, CDE Diabetes Coordinator Inpatient Diabetes Program [phone] (Team Pager)

## 2019-07-18 NOTE — Progress Notes (Addendum)
[address]       [place] [address]             [phone]      6 Days Post-Op Procedure(s) (LRB): CORONARY ARTERY BYPASS GRAFTING (CABG), ON PUMP, TIMES FOUR , USING LEFT INTERNAL MAMMARY ARTERY AND ENDOSCOPICALLY HARVESTED BILATERAL GREATER SAPHENOUS VEINS (N/A) TRANSESOPHAGEAL ECHOCARDIOGRAM (TEE) (N/A) Insertion Central Line Adult (Right) Subjective: feeling stronger- slowly No appetite this am  Objective: Vital signs in last 24 hours: Temp:  [98 F (36.7 C)-98.6 F (37 C)] 98 F (36.7 C) (03/08 0600) Pulse Rate:  [75-86] 83 (03/08 0600) Cardiac Rhythm: Normal sinus rhythm (03/07 1958) Resp:  [12-31] 16 (03/08 0600) BP: (109-152)/(66-74) 129/72 (03/08 0400) SpO2:  [85 %-96 %] 90 % (03/08 0600) Weight:  [062 kg] 116 kg (03/08 0555)  Hemodynamic parameters for last 24 hours:    Intake/Output from previous day: 03/07 0701 - 03/08 0700 In: 960 [P.O.:960] Out: 3300 [Urine:3300] Intake/Output this shift: No intake/output data recorded.  General appearance: alert, cooperative and no distress Heart: regular rate and rhythm Lungs: clear to auscultation bilaterally Abdomen: obese, soft, non-tender Extremities: min edema Wound: incis healing well  Lab Results: Recent Labs    07/17/19 0241 07/18/19 0233  WBC 7.0 6.3  HGB 9.7* 9.6*  HCT 30.0* 28.6*  PLT 120* 175   BMET:  Recent Labs    07/17/19 0241 07/18/19 0233  NA 139 137  K 4.0 3.7  CL 100 95*  CO2 26 27  GLUCOSE 142* 143*  BUN 43* 49*  CREATININE 2.57* 2.60*  CALCIUM 8.3* 8.4*    PT/INR: No results for input(s): LABPROT, INR in the last 72 hours. ABG    Component Value Date/Time   PHART 7.373 07/13/2019 0349   HCO3 21.8 07/13/2019 0349   TCO2 23 07/13/2019 0349   ACIDBASEDEF 3.0 (H) 07/13/2019 0349   O2SAT 54.9 07/15/2019 0650   CBG (last 3)  Recent Labs    07/17/19 1701 07/17/19 2147 07/18/19 0617  GLUCAP 87 150* 147*    Meds Scheduled Meds: . sodium  chloride   Intravenous Once  . amiodarone  400 mg Oral BID  . aspirin EC  81 mg Oral Daily  . atorvastatin  80 mg Oral q1800  . bisacodyl  10 mg Oral Daily   Or  . bisacodyl  10 mg Rectal Daily  . Chlorhexidine Gluconate Cloth  6 each Topical Daily  . clopidogrel  75 mg Oral Daily  . docusate sodium  200 mg Oral Daily  . enoxaparin (LOVENOX) injection  40 mg Subcutaneous Q24H  . gabapentin  300 mg Oral QHS  . guaiFENesin  600 mg Oral BID  . insulin aspart  0-24 Units Subcutaneous TID WC & HS  . insulin aspart  4 Units Subcutaneous TID WC  . insulin detemir  24 Units Subcutaneous BID  . magnesium oxide  400 mg Oral BID  . mouth rinse  15 mL Mouth Rinse BID  . metoprolol tartrate  12.5 mg Oral BID  . mometasone-formoterol  2 puff Inhalation BID  . pantoprazole  40 mg Oral Daily  . sodium chloride flush  3 mL Intravenous Q12H  . tamsulosin  0.8 mg Oral QPC supper   Continuous Infusions: . sodium chloride Stopped (07/13/19 0954)  . sodium chloride    . sodium chloride 20 mL/hr at 07/12/19 1540  . lactated ringers     PRN Meds:.sodium chloride, albuterol, dextrose, ondansetron (ZOFRAN) IV,  oxyCODONE, sodium chloride flush  Xrays DG Chest 2 View  Result Date: 07/17/2019 CLINICAL DATA:  CABG EXAM: CHEST - 2 VIEW COMPARISON:  07/16/2019 FINDINGS: Persistent small bilateral pleural effusions and bibasilar atelectasis. No pneumothorax. Stable cardiomediastinal contours. Central line has been removed. IMPRESSION: Persistent small bilateral pleural effusions and bibasilar atelectasis. Electronically Signed   By: Macy Mis M.D.   On: 07/17/2019 06:03    Assessment/Plan: S/P Procedure(s) (LRB): CORONARY ARTERY BYPASS GRAFTING (CABG), ON PUMP, TIMES FOUR , USING LEFT INTERNAL MAMMARY ARTERY AND ENDOSCOPICALLY HARVESTED BILATERAL GREATER SAPHENOUS VEINS (N/A) TRANSESOPHAGEAL ECHOCARDIOGRAM (TEE) (N/A) Insertion Central Line Adult (Right)  1 conts to make slow/steady progress 2  hemodyn stable  3 sats on RA in 80's at times 4 creat is fairly stable at 2.6 5 some minot volume overload 6 BS fair contol- will ask diabetic coordinator to see and make recs 7 push rehab and pulm toilet- routine 8 thrombocytopenia resolved 9 H/H fairly stable 10 no leukocytosis 11 hopefully stable for d/c by tomorrow  LOS: 6 days    Steed Giovanni PA-C Pager [phone] 07/18/2019  Weaning off O2 Maintaining NSR Incisions clean  Home in am if creatinine is improved and Off O2  patient examined and medical record reviewed,agree with above note. Tharon Aquas Trigt III 07/18/2019

## 2019-07-18 NOTE — Progress Notes (Signed)
MOBILITY TEAM - Progress Note   07/18/19 1039  Mobility  Activity Ambulated in hall  Level of Assistance Standby assist, set-up cues, supervision of patient - no hands on  Assistive Device Front wheel walker  Distance Ambulated (ft) 104 ft  Mobility Response Tolerated fair  Bed Position High-fowlers   HR 91 SpO2 96-98% on RA  Patient see for second walk today; tolerated well with rolling walker on room air. Took 2x standing rest breaks secondary to SOB. Declined further distance secondary to fatigue. Requesting return to bed despite encouragement to continue sitting in recliner; mod indep for return to supine with HOB elevated.  Mabeline Caras, PT, DPT Mobility Team Pager [phone]

## 2019-07-18 NOTE — Progress Notes (Signed)
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 Pt now s/p CABG x4 on 07/12/2019.    PT Comments    Patient seen for mobility progression. Pt tolerated gait distance of 80 ft and requested to return to bed end of session. Gait distance limited by fatigue and nausea. BP below in general comments. RN notified of nausea.  PT will continue to follow and progress as tolerated.    Follow Up Recommendations  Home health PT;Supervision/Assistance - 24 hour     Equipment Recommendations  (pt owns necessary DME)    Recommendations for Other Services       Precautions / Restrictions Precautions Precautions: Fall;Sternal Precaution Comments: pt maintained sternal precautions without cues  Restrictions Weight Bearing Restrictions: No    Mobility  Bed Mobility Overal bed mobility: Needs Assistance Bed Mobility: Supine to Sit;Sit to Supine     Supine to sit: Supervision Sit to supine: Supervision      Transfers Overall transfer level: Needs assistance Equipment used: Rolling walker (2 wheeled) Transfers: Sit to/from Stand Sit to Stand: Min guard         General transfer comment: min guard for safety  Ambulation/Gait Ambulation/Gait assistance: Min guard Gait Distance (Feet): 80 Feet Assistive device: Rolling walker (2 wheeled) Gait Pattern/deviations: Step-through pattern;Decreased stride length;Trunk flexed Gait velocity: reduced   General Gait Details: cues for upright posture; pt limited by fatigue and nausea    Stairs             Wheelchair Mobility    Modified Rankin (Stroke Patients Only)       Balance Overall balance  assessment: Needs assistance Sitting-balance support: No upper extremity supported;Feet supported Sitting balance-Leahy Scale: Good     Standing balance support: Bilateral upper extremity supported Standing balance-Leahy Scale: Poor                              Cognition Arousal/Alertness: Awake/alert Behavior During Therapy: Flat affect Overall Cognitive Status: Within Functional Limits for tasks assessed                                        Exercises      General Comments General comments (skin integrity, edema, etc.): HR 70s-90s; BP in sitting after ambulating 94/68 (79) and end of session pt supine 143/78 (96)      Pertinent Vitals/Pain Pain Assessment: Faces Faces Pain Scale: Hurts little more Pain Location: chest Pain Descriptors / Indicators: Discomfort Pain Intervention(s): Monitored during session    Home Living                      Prior Function            PT Goals (current goals can now be found in the care plan section) Progress towards PT goals: Progressing toward goals    Frequency    Min 3X/week      PT Plan Current plan remains appropriate    Co-evaluation              AM-PAC PT "6 Clicks" Mobility   Outcome  Measure  Help needed turning from your back to your side while in a flat bed without using bedrails?: A Little Help needed moving from lying on your back to sitting on the side of a flat bed without using bedrails?: A Little Help needed moving to and from a bed to a chair (including a wheelchair)?: A Little Help needed standing up from a chair using your arms (e.g., wheelchair or bedside chair)?: A Little Help needed to walk in hospital room?: A Little Help needed climbing 3-5 steps with a railing? : A Lot 6 Click Score: 17    End of Session   Activity Tolerance: Other (comment);Patient limited by fatigue(limited by nausea ) Patient left: in bed;with call bell/phone within reach Nurse  Communication: Mobility status PT Visit Diagnosis: Muscle weakness (generalized) (M62.81);Other abnormalities of gait and mobility (R26.89)     Time: 1610-9604 PT Time Calculation (min) (ACUTE ONLY): 20 min  Charges:  $Gait Training: 8-22 mins                     Earney Navy, PTA Acute Rehabilitation Services Pager: [phone] Office: [phone]     Darliss Cheney 07/18/2019, 4:18 PM

## 2019-07-18 NOTE — Care Management Important Message (Signed)
Important Message  Patient Details  Name: Clinton Ortiz MRN: [id number] Date of Birth: Nov 27, 1940   Medicare Important Message Given:  Yes     Shelda Altes 07/18/2019, 10:56 AM

## 2019-07-18 NOTE — Progress Notes (Signed)
CARDIAC REHAB PHASE I   PRE:  Rate/Rhythm: 90 SR    BP: sitting 133/72    SaO2: 92 RA  MODE:  Ambulation: 84 ft   POST:  Rate/Rhythm: 101 ST    BP: sitting 142/101     SaO2: 95 RA  Pt needed mod-max assist to get to EOB. Gave instructions on proper mechanics for next time (pt had tried to sit straight up with his stomach muscles, failing). Stood fairly independently with verbal cues. Used RW and chair follow, assist x2 to ensure no need to sit. Fairly steady, just fatigue and SOB. To recliner after walk. Discussed x2 more walks today with PT/mobility. Also need for sitting in recliner off and on today. Practiced IS, 1200 mL. No O2 needed in bed or walking.  [place], ACSM 07/18/2019 9:14 AM

## 2019-07-19 LAB — COMPREHENSIVE METABOLIC PANEL
ALT: 26 U/L (ref 0–44)
AST: 28 U/L (ref 15–41)
Albumin: 2.6 g/dL — ABNORMAL LOW (ref 3.5–5.0)
Alkaline Phosphatase: 63 U/L (ref 38–126)
Anion gap: 13 (ref 5–15)
BUN: 50 mg/dL — ABNORMAL HIGH (ref 8–23)
CO2: 29 mmol/L (ref 22–32)
Calcium: 8.7 mg/dL — ABNORMAL LOW (ref 8.9–10.3)
Chloride: 96 mmol/L — ABNORMAL LOW (ref 98–111)
Creatinine, Ser: 2.6 mg/dL — ABNORMAL HIGH (ref 0.61–1.24)
GFR calc Af Amer: 26 mL/min — ABNORMAL LOW (ref >60)
GFR calc non Af Amer: 23 mL/min — ABNORMAL LOW (ref >60)
Glucose, Bld: 138 mg/dL — ABNORMAL HIGH (ref 70–99)
Potassium: 4 mmol/L (ref 3.5–5.1)
Sodium: 138 mmol/L (ref 135–145)
Total Bilirubin: 1 mg/dL (ref 0.3–1.2)
Total Protein: 5.6 g/dL — ABNORMAL LOW (ref 6.5–8.1)

## 2019-07-19 LAB — CBC
HCT: 29.5 % — ABNORMAL LOW (ref 39.0–52.0)
Hemoglobin: 9.9 g/dL — ABNORMAL LOW (ref 13.0–17.0)
MCH: 32 pg (ref 26.0–34.0)
MCHC: 33.6 g/dL (ref 30.0–36.0)
MCV: 95.5 fL (ref 80.0–100.0)
Platelets: 222 10*3/uL (ref 150–400)
RBC: 3.09 MIL/uL — ABNORMAL LOW (ref 4.22–5.81)
RDW: 13.2 % (ref 11.5–15.5)
WBC: 6.3 10*3/uL (ref 4.0–10.5)
nRBC: 0.5 % — ABNORMAL HIGH (ref 0.0–0.2)

## 2019-07-19 LAB — GLUCOSE, CAPILLARY
Glucose-Capillary: 144 mg/dL — ABNORMAL HIGH (ref 70–99)
Glucose-Capillary: 220 mg/dL — ABNORMAL HIGH (ref 70–99)
Glucose-Capillary: 162 mg/dL — ABNORMAL HIGH (ref 70–99)
Glucose-Capillary: 154 mg/dL — ABNORMAL HIGH (ref 70–99)

## 2019-07-19 NOTE — Progress Notes (Signed)
MOBILITY TEAM - Progress Note   07/19/19 1400  Mobility  Activity Ambulated in hall  Level of Assistance Standby assist, set-up cues, supervision of patient - no hands on  Assistive Device Front wheel walker  Distance Ambulated (ft) 228 ft  Mobility Response Tolerated well  Bed Position Semi-fowlers   Pt required 4x standing rest break secondary to c/o SOB and nausea (had recently finished eating lunch). HR 93 with mobility. Declined ambulation without DME.   Mabeline Caras, PT, DPT Mobility Team Pager [phone]

## 2019-07-19 NOTE — Progress Notes (Signed)
Inpatient Diabetes Program Recommendations  AACE/ADA: New Consensus Statement on Inpatient Glycemic Control   Target Ranges:  Prepandial:   less than 140 mg/dL      Peak postprandial:   less than 180 mg/dL (1-2 hours)      Critically ill patients:  140 - 180 mg/dL  Results for RAHUL, MALINAK (MRN [id number]) as of 07/19/2019 08:53  Ref. Range 07/18/2019 06:17 07/18/2019 11:53 07/18/2019 16:01 07/18/2019 21:13 07/19/2019 06:12  Glucose-Capillary Latest Ref Range: 70 - 99 mg/dL 147 (H) 196 (H) 145 (H) 139 (H) 144 (H)    Review of Glycemic Control  Diabetes history: DM2 Outpatient Diabetes medications: Trulicity 4.5 mg Qweek, Glipizide 10 mg BID Current orders for Inpatient glycemic control: Levemir 24 units BID, Novolog 0-24 units AC&HS, Novolog 4 units TID with meals  Inpatient Diabetes Program Recommendations:    HbgA1C:  A1C 9.7% on 07/08/19 indicating an average glucose of 232 mg/dl over the past 2-3 months. Would recommend discharging on similar insulin regimen as being used inpatient and have patient follow up with PCP to ensure glucose is well controlled. Patient is willing to use insulin outpatient if needed; if so please prescribe insulin pens.  Thanks, Barnie Alderman, RN, MSN, CDE Diabetes Coordinator Inpatient Diabetes Program [phone] (Team Pager from 8am to 5pm)

## 2019-07-19 NOTE — Progress Notes (Signed)
CARDIAC REHAB PHASE I   PRE:  Rate/Rhythm: 88 SR    BP: sitting 119/82    SaO2: 96 RA  MODE:  Ambulation: 130 ft   POST:  Rate/Rhythm: 98 SR    BP: sitting 135/84     SaO2: 94 RA  Pt able to stand independently and walk with rollator. Increased distance today. Rest x2. To recliner. SOB and fatigue but doing well. He has a rollator at home that is his wifes. He can use hers until he sees if that size is best (its narrow and he really needs a wide one but doesn't think it will work well in his house). I will wait on daughter to do education. [phone]   [place], ACSM 07/19/2019 10:08 AM

## 2019-07-19 NOTE — Progress Notes (Signed)
Resp. In room and change O2 probe to their machine to monitor during the night.

## 2019-07-19 NOTE — Progress Notes (Addendum)
[address]       [place] [address]             [phone]      7 Days Post-Op Procedure(s) (LRB): CORONARY ARTERY BYPASS GRAFTING (CABG), ON PUMP, TIMES FOUR , USING LEFT INTERNAL MAMMARY ARTERY AND ENDOSCOPICALLY HARVESTED BILATERAL GREATER SAPHENOUS VEINS (N/A) TRANSESOPHAGEAL ECHOCARDIOGRAM (TEE) (N/A) Insertion Central Line Adult (Right) Subjective: desats to 80's with sleep- prob sleep apnea , may need O2 at night and sleep study/CPAP  Objective: Vital signs in last 24 hours: Temp:  [97.9 F (36.6 C)-98.7 F (37.1 C)] 98 F (36.7 C) (03/09 0409) Pulse Rate:  [74-88] 78 (03/09 0409) Cardiac Rhythm: Normal sinus rhythm (03/08 1952) Resp:  [16-24] 19 (03/09 0409) BP: (113-137)/(69-78) 125/74 (03/09 0409) SpO2:  [88 %-97 %] 92 % (03/09 0409) Weight:  [118.4 kg] 118.4 kg (03/09 0607)  Hemodynamic parameters for last 24 hours:    Intake/Output from previous day: 03/08 0701 - 03/09 0700 In: -  Out: 200 [Urine:200] Intake/Output this shift: No intake/output data recorded.  General appearance: alert, cooperative and no distress Heart: regular rate and rhythm Lungs: min dim in bases Abdomen: benign Extremities: min edema Wound: incis healing well  Lab Results: Recent Labs    07/18/19 0233 07/19/19 0249  WBC 6.3 6.3  HGB 9.6* 9.9*  HCT 28.6* 29.5*  PLT 175 222   BMET:  Recent Labs    07/18/19 0233 07/19/19 0249  NA 137 138  K 3.7 4.0  CL 95* 96*  CO2 27 29  GLUCOSE 143* 138*  BUN 49* 50*  CREATININE 2.60* 2.60*  CALCIUM 8.4* 8.7*    PT/INR: No results for input(s): LABPROT, INR in the last 72 hours. ABG    Component Value Date/Time   PHART 7.373 07/13/2019 0349   HCO3 21.8 07/13/2019 0349   TCO2 23 07/13/2019 0349   ACIDBASEDEF 3.0 (H) 07/13/2019 0349   O2SAT 54.9 07/15/2019 0650   CBG (last 3)  Recent Labs    07/18/19 1601 07/18/19 2113 07/19/19 0612  GLUCAP 145* 139* 144*    Meds Scheduled Meds: . sodium  chloride   Intravenous Once  . amiodarone  400 mg Oral BID  . aspirin EC  81 mg Oral Daily  . atorvastatin  80 mg Oral q1800  . bisacodyl  10 mg Oral Daily   Or  . bisacodyl  10 mg Rectal Daily  . Chlorhexidine Gluconate Cloth  6 each Topical Daily  . clopidogrel  75 mg Oral Daily  . docusate sodium  200 mg Oral Daily  . enoxaparin (LOVENOX) injection  40 mg Subcutaneous Q24H  . gabapentin  300 mg Oral QHS  . guaiFENesin  600 mg Oral BID  . insulin aspart  0-24 Units Subcutaneous TID WC & HS  . insulin aspart  4 Units Subcutaneous TID WC  . insulin detemir  24 Units Subcutaneous BID  . magnesium oxide  400 mg Oral BID  . mouth rinse  15 mL Mouth Rinse BID  . metoprolol tartrate  12.5 mg Oral BID  . mometasone-formoterol  2 puff Inhalation BID  . pantoprazole  40 mg Oral Daily  . sodium chloride flush  3 mL Intravenous Q12H  . tamsulosin  0.8 mg Oral QPC supper   Continuous Infusions: . sodium chloride Stopped (07/13/19 0954)  . sodium chloride    . sodium chloride 20 mL/hr at 07/12/19 1540   PRN Meds:.sodium chloride, albuterol, dextrose, ondansetron (  ZOFRAN) IV, oxyCODONE, sodium chloride flush  Xrays No results found.  Assessment/Plan: S/P Procedure(s) (LRB): CORONARY ARTERY BYPASS GRAFTING (CABG), ON PUMP, TIMES FOUR , USING LEFT INTERNAL MAMMARY ARTERY AND ENDOSCOPICALLY HARVESTED BILATERAL GREATER SAPHENOUS VEINS (N/A) TRANSESOPHAGEAL ECHOCARDIOGRAM (TEE) (N/A) Insertion Central Line Adult (Right)  1 stable 2 will try to arrange home O2 for night time use. Probable sleep apnea 3 BUN/Cr stable at 50/2.6 , will need close f/u as outpatient. Not sure if he has ever seen nephrologist 4 BS reasonable control- resume home meds at d/c but will need close f/u. Poss insulin use? 5 CBC is stable    LOS: 7 days    Aragorn Giovanni PA-C 07/19/2019 Pager [phone]  Home on HS O2 . DC instructions reviewed with patient HHN for restorative care- face to face completed. Will  have BMP drawn  Monday and weekly  patient examined and medical record reviewed,agree with above note. Tharon Aquas Trigt III 07/19/2019

## 2019-07-19 NOTE — Progress Notes (Signed)
      [address]       [place] [address]             [phone]       Discussed plan of care with Dr. Prescott Gum. The patient will be kept overnight so that we can monitor his oxygen saturation so that he will qualify for home oxygen at night.  Will need outpatient sleep apnea consultation.    Nicholes Rough, PA-C

## 2019-07-19 NOTE — Progress Notes (Signed)
O2 sats ranging from 83-90% on room air and awake. Patient has bitten finger nails down. Change O2 probe to the ear and getting O2 sats 92-95 % on rm air.

## 2019-07-20 LAB — GLUCOSE, CAPILLARY: Glucose-Capillary: 144 mg/dL — ABNORMAL HIGH (ref 70–99)

## 2019-07-20 MED ORDER — AMIODARONE HCL 200 MG PO TABS: 200.0000 mg | ORAL_TABLET | Freq: Two times a day (BID) | ORAL | 1 refills | Status: DC

## 2019-07-20 MED ORDER — CLOPIDOGREL BISULFATE 75 MG PO TABS: 75.0000 mg | ORAL_TABLET | Freq: Every day | ORAL | 1 refills | Status: DC

## 2019-07-20 MED ORDER — ASPIRIN 81 MG PO TBEC: 81.0000 mg | DELAYED_RELEASE_TABLET | Freq: Every day | ORAL | Status: DC

## 2019-07-20 MED ORDER — OXYCODONE HCL 5 MG PO TABS: 5.0000 mg | ORAL_TABLET | Freq: Four times a day (QID) | ORAL | 0 refills | Status: AC | PRN

## 2019-07-20 MED ORDER — METOPROLOL TARTRATE 25 MG PO TABS: 12.5000 mg | ORAL_TABLET | Freq: Two times a day (BID) | ORAL | 1 refills | Status: DC

## 2019-07-20 NOTE — Progress Notes (Signed)
Discussed IS, sternal precautions, exercise, diet (esp DM), and CRPII with pt and daughter. Will refer to [place]. Pt is interested in participating in Virtual Cardiac and Pulmonary Rehab. Pt advised that Virtual Cardiac and Pulmonary Rehab is provided at no cost to the patient.  Checklist:  1. Pt has smart device  ie smartphone and/or ipad for downloading an app  Yes 2. Reliable internet/wifi service    Yes 3. Understands how to use their smartphone and navigate within an app.  Yes Pt verbalized understanding and is in agreement. [id number] [place], ACSM 10:20 AM 07/20/2019

## 2019-07-20 NOTE — TOC Transition Note (Signed)
Transition of Care [facility]) - CM/SW Discharge Note Marvetta Gibbons RN, BSN Transitions of Care Unit 4E- RN Case Manager [phone]   Patient Details  Name: Clinton Ortiz MRN: [id number] Date of Birth: [birth year]  Transition of Care [facility]) CM/SW Contact:  Dawayne Patricia, RN Phone Number: 07/20/2019, 12:19 PM   Clinical Narrative:    Pt stable for transition home today, overnight oximetry completed last night- Zach with [employer] to review for Medicare guidelines to be sure pt qualified for night time home 02- per Thedore Mins on review of records pt has qualified for home 02- night time use and [employer] will f/u to deliver home 02 today to the home. Notified Butch Penny with [facility] for updated [place] orders and start of care.    Final next level of care: [place] Barriers to Discharge: Barriers Resolved   Patient Goals and CMS Choice Patient states their goals for this hospitalization and ongoing recovery are:: get up and around and get moving CMS Medicare.gov Compare Post Acute Care list provided to:: Patient Choice offered to / list presented to : Patient  Discharge Placement                 Home with [facility]      Discharge Plan and Services In-house Referral: (walker) Discharge Planning Services: CM Consult Post Acute Care Choice: Home Health          DME Arranged: N/A DME Agency: [employer] Date DME Agency Contacted: 07/20/19 Time DME Agency Contacted: 1000 Representative spoke with at DME Agency: zach HH Arranged: RN, PT [place] Agency: [place] ([place]) Date [place]: 07/20/19 Time [place]: 1100 Representative spoke with at [place]: [place] ([place]) Interventions     Readmission Risk Interventions No flowsheet data found.

## 2019-07-20 NOTE — Discharge Instructions (Signed)
Discharge Instructions:  Coronary Artery Bypass Grafting, Care After This sheet gives you information about how to care for yourself after your procedure. Your doctor may also give you more specific instructions. If you have problems or questions, call your doctor. What can I expect after the procedure? After the procedure, it is common to:  Feel sick to your stomach (nauseous).  Not want to eat as much as normal (lack of appetite).  Have trouble pooping (constipation).  Have weakness and tiredness (fatigue).  Feel sad (depressed) or grouchy (irritable).  Have pain or discomfort around the cuts from surgery (incisions). Follow these instructions at home: Medicines  Take over-the-counter and prescription medicines only as told by your doctor. Do not stop taking medicines or start any new medicines unless your doctor says it is okay.  If you were prescribed an antibiotic medicine, take it as told by your doctor. Do not stop taking the antibiotic even if you start to feel better. Incision care   Follow instructions from your doctor about how to take care of your cuts from surgery. Make sure you: ? Wash your hands with soap and water before and after you change your bandage (dressing). If you cannot use soap and water, use hand sanitizer. ? Change your bandage as told by your doctor. ? Leave stitches (sutures), skin glue, or skin tape (adhesive) strips in place. They may need to stay in place for 2 weeks or longer. If tape strips get loose and curl up, you may trim the loose edges. Do not remove tape strips completely unless your doctor says it is okay.  Make sure the surgery cuts are clean, dry, and protected.  Check your cut areas every day for signs of infection. Check for: ? More redness, swelling, or pain. ? More fluid or blood. ? Warmth. ? Pus or a bad smell.  If cuts were made in your legs: ? Avoid crossing your legs. ? Avoid sitting for long periods of time. Change  positions every 30 minutes. ? Raise (elevate) your legs when you are sitting. Bathing  Do not take baths, swim, or use a hot tub until your doctor says it is okay.  You may shower. Pat the surgery cuts dry. Do not rub the cuts to dry. Eating and drinking   Eat foods that are high in fiber, such as beans, nuts, whole grains, and raw fruits and vegetables. Any meats you eat should be lean cut. Avoid canned, processed, and fried foods. This can help prevent trouble pooping. This is also a part of a heart-healthy diet.  Drink enough fluid to keep your pee (urine) pale yellow.  Do not drink alcohol until you are fully recovered. Ask your doctor when it is safe to drink alcohol. Activity  Rest and limit your activity as told by your doctor. You may be told to: ? Stop any activity right away if you have chest pain, shortness of breath, irregular heartbeats, or dizziness. Get help right away if you have any of these symptoms. ? Move around often for short periods or take short walks as told by your doctor. Slowly increase your activities. ? Avoid lifting, pushing, or pulling anything that is heavier than 10 lb (4.5 kg) for at least 6 weeks or as told by your doctor.  Do physical therapy or a cardiac rehab (cardiac rehabilitation) program as told by your doctor. ? Physical therapy involves doing exercises to maintain movement and build strength and endurance. ? A cardiac rehab program includes:  Exercise training.  Education.  Counseling.  Do not drive until your doctor says it is okay.  Ask your doctor when you can go back to work.  Ask your doctor when you can be sexually active. General instructions  Do not drive or use heavy machinery while taking prescription pain medicine.  Do not use any products that contain nicotine or tobacco. These include cigarettes, e-cigarettes, and chewing tobacco. If you need help quitting, ask your doctor.  Take 2-3 deep breaths every few hours  during the day while you get better. This helps expand your lungs and prevent problems.  If you were given a device called an incentive spirometer, use it several times a day to practice deep breathing. Support your chest with a pillow or your arms when you take deep breaths or cough.  Wear compression stockings as told by your doctor.  Weigh yourself every day. This helps to see if your body is holding (retaining) fluid that may make your heart and lungs work harder.  Keep all follow-up visits as told by your doctor. This is important. Contact a doctor if:  You have more redness, swelling, or pain around any cut.  You have more fluid or blood coming from any cut.  Any cut feels warm to the touch.  You have pus or a bad smell coming from any cut.  You have a fever.  You have swelling in your ankles or legs.  You have pain in your legs.  You gain 2 lb (0.9 kg) or more a day.  You feel sick to your stomach or you throw up (vomit).  You have watery poop (diarrhea). Get help right away if:  You have chest pain that goes to your jaw or arms.  You are short of breath.  You have a fast or irregular heartbeat.  You notice a "clicking" in your breastbone (sternum) when you move.  You have any signs of a stroke. "BE FAST" is an easy way to remember the main warning signs: ? B - Balance. Signs are dizziness, sudden trouble walking, or loss of balance. ? E - Eyes. Signs are trouble seeing or a change in how you see. ? F - Face. Signs are sudden weakness or loss of feeling of the face, or the face or eyelid drooping on one side. ? A - Arms. Signs are weakness or loss of feeling in an arm. This happens suddenly and usually on one side of the body. ? S - Speech. Signs are sudden trouble speaking, slurred speech, or trouble understanding what people say. ? T - Time. Time to call emergency services. Write down what time symptoms started.  You have other signs of a stroke, such as: ? A  sudden, very bad headache with no known cause. ? Feeling sick to your stomach. ? Throwing up. ? Jerky movements you cannot control (seizure). These symptoms may be an emergency. Do not wait to see if the symptoms will go away. Get medical help right away. Call your local emergency services (911 in the [place] Do not drive yourself to the hospital. Summary  After the procedure, it is common to have pain or discomfort in the cuts from surgery (incisions).  Do not take baths, swim, or use a hot tub until your doctor says it is okay.  Slowly increase your activities. You may need physical therapy or cardiac rehab.  Weigh yourself every day. This helps to see if your body is holding fluid. This information is not  intended to replace advice given to you by your health care provider. Make sure you discuss any questions you have with your health care provider. Document Revised: 01/05/2018 Document Reviewed: 01/05/2018 Elsevier Patient Education  [place] may shower, please wash incisions daily with soap and water and keep dry.  If you wish to cover wounds with dressing you may do so but please keep clean and change daily.  No tub baths or swimming until incisions have completely healed.  If your incisions become red or develop any drainage please call our office at [phone]  2. No Driving until cleared by Dr. Thayer Ohm office and you are no longer using narcotic pain medications  3. Monitor your weight daily.. Please use the same scale and weigh at same time... If you gain 5-10 lbs in 48 hours with associated lower extremity swelling, please contact our office at [phone]  4. Fever of 101.5 for at least 24 hours with no source, please contact our office at [phone]  5. Activity- up as tolerated, please walk at least 3 times per day.  Avoid strenuous activity, no lifting, pushing, or pulling with your arms over 8-10 lbs for a minimum of 6 weeks  6. If any questions  or concerns arise, please do not hesitate to contact our office at [phone]    Endoscopic Saphenous Vein Harvesting, Care After This sheet gives you information about how to care for yourself after your procedure. Your health care provider may also give you more specific instructions. If you have problems or questions, contact your health care provider. What can I expect after the procedure? After the procedure, it is common to have:  Pain.  Bruising.  Swelling.  Numbness. Follow these instructions at home: Incision care   Follow instructions from your health care provider about how to take care of your incisions. Make sure you: ? Wash your hands with soap and water before and after you change your bandages (dressings). If soap and water are not available, use hand sanitizer. ? Change your dressings as told by your health care provider. ? Leave stitches (sutures), skin glue, or adhesive strips in place. These skin closures may need to stay in place for 2 weeks or longer. If adhesive strip edges start to loosen and curl up, you may trim the loose edges. Do not remove adhesive strips completely unless your health care provider tells you to do that.  Check your incision areas every day for signs of infection. Check for: ? More redness, swelling, or pain. ? Fluid or blood. ? Warmth. ? Pus or a bad smell. Medicines  Take over-the-counter and prescription medicines only as told by your health care provider.  Ask your health care provider if the medicine prescribed to you requires you to avoid driving or using heavy machinery. General instructions  Raise (elevate) your legs above the level of your heart while you are sitting or lying down.  Avoid crossing your legs.  Avoid sitting for long periods of time. Change positions every 30 minutes.  Do any exercises your health care providers have given you. These may include deep breathing, coughing, and walking exercises.  Do not  take baths, swim, or use a hot tub until your health care provider approves. Ask your health care provider if you may take showers. You may only be allowed to take sponge baths.  Wear compression stockings as told by your health care provider. These stockings help to prevent blood clots and reduce swelling  in your legs.  Keep all follow-up visits as told by your health care provider. This is important. Contact a health care provider if:  Medicine does not help your pain.  Your pain gets worse.  You have new leg bruises or your leg bruises get bigger.  Your leg feels numb.  You have more redness, swelling, or pain around your incision.  You have fluid or blood coming from your incision.  Your incision feels warm to the touch.  You have pus or a bad smell coming from your incision.  You have a fever. Get help right away if:  Your pain is severe.  You develop pain, tenderness, warmth, redness, or swelling in any part of your leg.  You have chest pain.  You have trouble breathing. Summary  Raise (elevate) your legs above the level of your heart while you are sitting or lying down.  Wear compression stockings as told by your health care provider.  Make sure you know which symptoms should prompt you to contact your health care provider.  Keep all follow-up visits as told by your health care provider. This information is not intended to replace advice given to you by your health care provider. Make sure you discuss any questions you have with your health care provider. Document Revised: 04/05/2018 Document Reviewed: 04/05/2018 Elsevier Patient Education  [place].   Diabetes Mellitus and Nutrition, Adult When you have diabetes (diabetes mellitus), it is very important to have healthy eating habits because your blood sugar (glucose) levels are greatly affected by what you eat and drink. Eating healthy foods in the appropriate amounts, at about the same times every  day, can help you:  Control your blood glucose.  Lower your risk of heart disease.  Improve your blood pressure.  Reach or maintain a healthy weight. Every person with diabetes is different, and each person has different needs for a meal plan. Your health care provider may recommend that you work with a diet and nutrition specialist (dietitian) to make a meal plan that is best for you. Your meal plan may vary depending on factors such as:  The calories you need.  The medicines you take.  Your weight.  Your blood glucose, blood pressure, and cholesterol levels.  Your activity level.  Other health conditions you have, such as heart or kidney disease. How do carbohydrates affect me? Carbohydrates, also called carbs, affect your blood glucose level more than any other type of food. Eating carbs naturally raises the amount of glucose in your blood. Carb counting is a method for keeping track of how many carbs you eat. Counting carbs is important to keep your blood glucose at a healthy level, especially if you use insulin or take certain oral diabetes medicines. It is important to know how many carbs you can safely have in each meal. This is different for every person. Your dietitian can help you calculate how many carbs you should have at each meal and for each snack. Foods that contain carbs include:  Bread, cereal, rice, pasta, and crackers.  Potatoes and corn.  Peas, beans, and lentils.  Milk and yogurt.  Fruit and juice.  Desserts, such as cakes, cookies, ice cream, and candy. How does alcohol affect me? Alcohol can cause a sudden decrease in blood glucose (hypoglycemia), especially if you use insulin or take certain oral diabetes medicines. Hypoglycemia can be a life-threatening condition. Symptoms of hypoglycemia (sleepiness, dizziness, and confusion) are similar to symptoms of having too much alcohol.  If your health care provider says that alcohol is safe for you, follow  these guidelines:  Limit alcohol intake to no more than 1 drink per day for nonpregnant women and 2 drinks per day for men. One drink equals 12 oz of beer, 5 oz of wine, or 1 oz of hard liquor.  Do not drink on an empty stomach.  Keep yourself hydrated with water, diet soda, or unsweetened iced tea.  Keep in mind that regular soda, juice, and other mixers may contain a lot of sugar and must be counted as carbs. What are tips for following this plan?  Reading food labels  Start by checking the serving size on the "Nutrition Facts" label of packaged foods and drinks. The amount of calories, carbs, fats, and other nutrients listed on the label is based on one serving of the item. Many items contain more than one serving per package.  Check the total grams (g) of carbs in one serving. You can calculate the number of servings of carbs in one serving by dividing the total carbs by 15. For example, if a food has 30 g of total carbs, it would be equal to 2 servings of carbs.  Check the number of grams (g) of saturated and trans fats in one serving. Choose foods that have low or no amount of these fats.  Check the number of milligrams (mg) of salt (sodium) in one serving. Most people should limit total sodium intake to less than 2,300 mg per day.  Always check the nutrition information of foods labeled as "low-fat" or "nonfat". These foods may be higher in added sugar or refined carbs and should be avoided.  Talk to your dietitian to identify your daily goals for nutrients listed on the label. Shopping  Avoid buying canned, premade, or processed foods. These foods tend to be high in fat, sodium, and added sugar.  Shop around the outside edge of the grocery store. This includes fresh fruits and vegetables, bulk grains, fresh meats, and fresh dairy. Cooking  Use low-heat cooking methods, such as baking, instead of high-heat cooking methods like deep frying.  Cook using healthy oils, such as  olive, canola, or sunflower oil.  Avoid cooking with butter, cream, or high-fat meats. Meal planning  Eat meals and snacks regularly, preferably at the same times every day. Avoid going long periods of time without eating.  Eat foods high in fiber, such as fresh fruits, vegetables, beans, and whole grains. Talk to your dietitian about how many servings of carbs you can eat at each meal.  Eat 4-6 ounces (oz) of lean protein each day, such as lean meat, chicken, fish, eggs, or tofu. One oz of lean protein is equal to: ? 1 oz of meat, chicken, or fish. ? 1 egg. ?  cup of tofu.  Eat some foods each day that contain healthy fats, such as avocado, nuts, seeds, and fish. Lifestyle  Check your blood glucose regularly.  Exercise regularly as told by your health care provider. This may include: ? 150 minutes of moderate-intensity or vigorous-intensity exercise each week. This could be brisk walking, biking, or water aerobics. ? Stretching and doing strength exercises, such as yoga or weightlifting, at least 2 times a week.  Take medicines as told by your health care provider.  Do not use any products that contain nicotine or tobacco, such as cigarettes and e-cigarettes. If you need help quitting, ask your health care provider.  Work with a Social worker or diabetes educator  to identify strategies to manage stress and any emotional and social challenges. Questions to ask a health care provider  Do I need to meet with a diabetes educator?  Do I need to meet with a dietitian?  What number can I call if I have questions?  When are the best times to check my blood glucose? Where to find more information:  American Diabetes Association: diabetes.org  Academy of Nutrition and Dietetics: www.eatright.[employer] of Diabetes and Digestive and Kidney Diseases (NIH): [employer] Summary  A healthy meal plan will help you control your blood glucose and maintain a healthy  lifestyle.  Working with a diet and nutrition specialist (dietitian) can help you make a meal plan that is best for you.  Keep in mind that carbohydrates (carbs) and alcohol have immediate effects on your blood glucose levels. It is important to count carbs and to use alcohol carefully. This information is not intended to replace advice given to you by your health care provider. Make sure you discuss any questions you have with your health care provider. Document Revised: 04/10/2017 Document Reviewed: 06/02/2016 Elsevier Patient Education  2020 [employer].

## 2019-07-20 NOTE — Progress Notes (Addendum)
[address]       [place] [id number]             [phone]      8 Days Post-Op Procedure(s) (LRB): CORONARY ARTERY BYPASS GRAFTING (CABG), ON PUMP, TIMES FOUR , USING LEFT INTERNAL MAMMARY ARTERY AND ENDOSCOPICALLY HARVESTED BILATERAL GREATER SAPHENOUS VEINS (N/A) TRANSESOPHAGEAL ECHOCARDIOGRAM (TEE) (N/A) Insertion Central Line Adult (Right) Subjective: sats ok overnight, probe more accurate on earlobe then finger Some intermit nausea  Objective: Vital signs in last 24 hours: Temp:  [97.9 F (36.6 C)-98.7 F (37.1 C)] 97.9 F (36.6 C) (03/10 0400) Pulse Rate:  [77-87] 87 (03/10 0623) Cardiac Rhythm: Normal sinus rhythm (03/10 0415) Resp:  [16-41] 41 (03/10 0623) BP: (107-148)/(56-81) 134/76 (03/10 0400) SpO2:  [83 %-97 %] 97 % (03/10 0623) Weight:  [417 kg] 114 kg (03/10 0600)  Hemodynamic parameters for last 24 hours:    Intake/Output from previous day: 03/09 0701 - 03/10 0700 In: 240 [P.O.:240] Out: 1670 [Urine:1670] Intake/Output this shift: No intake/output data recorded.  General appearance: alert, cooperative and no distress Heart: regular rate and rhythm Lungs: min dim in bases Abdomen: soft, obese, non-tender Extremities: + edema Wound: incis healing well  Lab Results: Recent Labs    07/18/19 0233 07/19/19 0249  WBC 6.3 6.3  HGB 9.6* 9.9*  HCT 28.6* 29.5*  PLT 175 222   BMET:  Recent Labs    07/18/19 0233 07/19/19 0249  NA 137 138  K 3.7 4.0  CL 95* 96*  CO2 27 29  GLUCOSE 143* 138*  BUN 49* 50*  CREATININE 2.60* 2.60*  CALCIUM 8.4* 8.7*    PT/INR: No results for input(s): LABPROT, INR in the last 72 hours. ABG    Component Value Date/Time   PHART 7.373 07/13/2019 0349   HCO3 21.8 07/13/2019 0349   TCO2 23 07/13/2019 0349   ACIDBASEDEF 3.0 (H) 07/13/2019 0349   O2SAT 54.9 07/15/2019 0650   CBG (last 3)  Recent Labs    07/19/19 1610 07/19/19 2049 07/20/19 0604  GLUCAP 162* 154* 144*     Meds Scheduled Meds: . sodium chloride   Intravenous Once  . amiodarone  400 mg Oral BID  . aspirin EC  81 mg Oral Daily  . atorvastatin  80 mg Oral q1800  . bisacodyl  10 mg Oral Daily   Or  . bisacodyl  10 mg Rectal Daily  . Chlorhexidine Gluconate Cloth  6 each Topical Daily  . clopidogrel  75 mg Oral Daily  . docusate sodium  200 mg Oral Daily  . enoxaparin (LOVENOX) injection  40 mg Subcutaneous Q24H  . gabapentin  300 mg Oral QHS  . guaiFENesin  600 mg Oral BID  . insulin aspart  0-24 Units Subcutaneous TID WC & HS  . insulin aspart  4 Units Subcutaneous TID WC  . insulin detemir  24 Units Subcutaneous BID  . magnesium oxide  400 mg Oral BID  . mouth rinse  15 mL Mouth Rinse BID  . metoprolol tartrate  12.5 mg Oral BID  . mometasone-formoterol  2 puff Inhalation BID  . pantoprazole  40 mg Oral Daily  . sodium chloride flush  3 mL Intravenous Q12H  . tamsulosin  0.8 mg Oral QPC supper   Continuous Infusions: . sodium chloride Stopped (07/13/19 0954)  . sodium chloride    . sodium chloride 20 mL/hr at 07/12/19 1540   PRN Meds:.sodium chloride, albuterol, dextrose, ondansetron (ZOFRAN) IV,  oxyCODONE, sodium chloride flush  Xrays No results found.  Assessment/Plan: S/P Procedure(s) (LRB): CORONARY ARTERY BYPASS GRAFTING (CABG), ON PUMP, TIMES FOUR , USING LEFT INTERNAL MAMMARY ARTERY AND ENDOSCOPICALLY HARVESTED BILATERAL GREATER SAPHENOUS VEINS (N/A) TRANSESOPHAGEAL ECHOCARDIOGRAM (TEE) (N/A) Insertion Central Line Adult (Right)  1 hemodyn stable in sinus rhythm 2 sats ok overnight, rare reading below 90's sat- won't qualify for home O2, may need to still consider outpatient eval for sleep apnea 3 no new labs, to check renal fxn by home nursing next week 4 knows to f/u with primary care early for DM management, monitoring of renal issues 5 stable for d/c    LOS: 8 days    Katlin Giovanni PA-C Pager [phone] 07/20/2019   patient examined and medical  record reviewed,agree with above note. Tharon Aquas Trigt III 07/20/2019

## 2019-07-21 ENCOUNTER — Telehealth (HOSPITAL_COMMUNITY): Payer: Self-pay

## 2019-07-21 DIAGNOSIS — Z48812 Encounter for surgical aftercare following surgery on the circulatory system: Secondary | ICD-10-CM | POA: Diagnosis not present

## 2019-07-21 DIAGNOSIS — J449 Chronic obstructive pulmonary disease, unspecified: Secondary | ICD-10-CM | POA: Diagnosis not present

## 2019-07-21 NOTE — Telephone Encounter (Signed)
Pt insurance is active and benefits verified through Medicare A/B. Co-pay $0.00, DED $203.00/$203.00 met, out of pocket $0.00/$0.00 met, co-insurance 20%. No pre-authorization required. Passport, 07/21/2019 @ 11:04AM, GBM#[id number]  2ndary insurance is active and benefits verified through [place]. Co-pay $0.00, DED $0.00/$0.00 met, out of pocket $0.00/$0.00 met, co-insurance 0%. No pre-authorization required.   Will contact patient to see if he is interested in the Cardiac Rehab Program. If interested, patient will need to complete follow up appt. Once completed, patient will be contacted for scheduling upon review by the RN Navigator.

## 2019-07-21 NOTE — Telephone Encounter (Signed)
Attempted to call patient in regards to Cardiac Rehab - LM on VM 

## 2019-07-25 ENCOUNTER — Other Ambulatory Visit: Payer: Self-pay | Admitting: *Deleted

## 2019-07-25 ENCOUNTER — Telehealth: Payer: Self-pay

## 2019-07-25 NOTE — Telephone Encounter (Signed)
Telephone call received from pt's RN w/ [place]. She reports that pt is doing well. Says he has some edema to BLE, but his weight is stable and lungs are CTA. She reports being unable to draw BMP today d/t insufficient amount of blood. She states she will attempt blood draw again tomorrow afternoon; advised pt to stay well hydrated. Asked that she report any problems to [facility].

## 2019-07-25 NOTE — Patient Outreach (Signed)
[facility]) Care Management  07/25/2019  Clinton Ortiz [birth year] [id number]   RED ON EMMI ALERT - General Discharge Day # 1 Date: 3/12 Red Alert Reason: other questions/problems   Outreach attempt #1, successful, identity verified.  This care manager introduced self and stated purpose of call, [facility] care management services explained.  He report that he does not have any other questions or problems regarding his discharge or health care.  He lives with his wife, daughter is currently living with them to help during his recovery.  She has been managing his medications as well as MD appointments and meals.  He has appointments with cardiologist and surgeon on 3/31.  Denies needing transportation.  He will call to schedule visit with PCP to manage chronic medical conditions.  He does not feel he has any needs at this time for [facility] to assist with but is open to receiving contact information for future purposes.      Plan: RN CM will send successful outreach letter and close case as no further needs identified.  Valente David, [place], MSN [place] [phone]

## 2019-07-26 DIAGNOSIS — E1122 Type 2 diabetes mellitus with diabetic chronic kidney disease: Secondary | ICD-10-CM | POA: Diagnosis not present

## 2019-08-02 ENCOUNTER — Telehealth: Payer: Self-pay

## 2019-08-02 DIAGNOSIS — M25472 Effusion, left ankle: Secondary | ICD-10-CM

## 2019-08-02 DIAGNOSIS — M25471 Effusion, right ankle: Secondary | ICD-10-CM

## 2019-08-02 MED ORDER — POTASSIUM CHLORIDE ER 10 MEQ PO CPCR: 10.0000 meq | ORAL_CAPSULE | Freq: Every day | ORAL | 0 refills | Status: DC

## 2019-08-02 MED ORDER — FUROSEMIDE 40 MG PO TABS: 40.0000 mg | ORAL_TABLET | Freq: Every day | ORAL | 0 refills | Status: DC

## 2019-08-02 NOTE — Telephone Encounter (Signed)
-----   Message from Ivin Poot, MD sent at 08/02/2019  9:57 AM EDT ----- Regarding: RE: 3 lbs weight gain over night Contact: [phone] Please call in lasix 40 mg po every am for 7 days thanks ----- Message ----- From: Marylen Ponto, LPN Sent: 4/82/5003   3:22 PM EDT To: Antony Odea, PA-C, Ivin Poot, MD Subject: 3 lbs weight gain over night                   [place] called about Clinton Ortiz having a weight gain of 3 lbs over night. He has some swelling in his lower feet/ankles. ( He went to a [employer] over the weekend, had chicken) She also heard some crackles with lung sounds, left greater then right. He denies any shortness of breath, His v/s are good today. BP 104/76, P- 70, T- 98.6, R- 16 SpO2 -94 on RA . His last BMP on 3/16 th show Cr- 2.37 and bun- 36. He is not currently taking any diuretics. He is scheduled to see PVT on 08/10/19 Please advise Linden Dolin

## 2019-08-05 NOTE — Progress Notes (Deleted)
HPI: FU CAD.  I initially saw patient June 23, 2019 with an episode of chest pain.  Echocardiogram showed normal LV function, grade 1 diastolic dysfunction and mildly dilated aortic root measuring 45 millimeters.  Nuclear study showed ischemia and we scheduled the patient for cardiac catheterization.  Chest CT February 11 showed 1.8 cm nodule anterior to the liver and abdominal CT recommended; ascending thoracic aorta measured 4 cm.  There was also a 3 mm left upper lobe pulmonary nodule.  Abdominal CT March 2021 showed 1.8 cm nodule in the anterior perihepatic space and peritoneal neoplasm could not be excluded.  Percutaneous biopsy versus close CT surveillance recommended.  Cardiac catheterization February 2021 showed 60 to 70% left main, 60 to 70% proximal circumflex and normal left ventricular end-diastolic pressure.  Carotid Dopplers February 2021 showed 1 to 39% bilateral stenosis.  Patient underwent coronary artery bypass and graft by Dr. Darcey Nora March 2021.  He had a LIMA to the LAD, saphenous vein graft to the diagonal, saphenous vein graft to the ramus and saphenous vein graft to the right coronary artery.  Postoperative course complicated by atrial fibrillation treated with IV amiodarone.  Since discharge  Current Outpatient Medications  Medication Sig Dispense Refill  . acetaminophen (TYLENOL) 325 MG tablet Take 650 mg by mouth every 6 (six) hours as needed for mild pain or headache.     . Albuterol Sulfate (PROAIR RESPICLICK) 700 (90 BASE) MCG/ACT AEPB Inhale 2 puffs into the lungs every 4 (four) hours as needed. (Patient taking differently: Inhale 2 puffs into the lungs every 4 (four) hours as needed (shortness of breath). ) 1 each 0  . amiodarone (PACERONE) 200 MG tablet Take 1 tablet (200 mg total) by mouth 2 (two) times daily. 60 tablet 1  . aspirin EC 81 MG EC tablet Take 1 tablet (81 mg total) by mouth daily.    Marland Kitchen atorvastatin (LIPITOR) 80 MG tablet Take 1 tablet (80 mg  total) by mouth daily at 6 PM. 90 tablet 3  . azelastine (ASTELIN) 0.1 % nasal spray SPRAY 2 SPRAYS INTO EACH NOSTRIL TWICE A DAY AS DIRECTED (Patient taking differently: Place 2 sprays into both nostrils daily. ) 30 mL 1  . B Complex Vitamins (B COMPLEX PO) Take 1 tablet by mouth daily.     . Cholecalciferol (VITAMIN D3) 10 MCG (400 UNIT) CHEW Chew by mouth.    . clopidogrel (PLAVIX) 75 MG tablet Take 1 tablet (75 mg total) by mouth daily. 30 tablet 1  . Continuous Blood Gluc Receiver ([place]) DEVI 1 each by Does not apply route daily. 1 each 0  . Continuous Blood Gluc Sensor (DEXCOM G6 SENSOR) MISC 1 each by Does not apply route daily. 3 each 0  . Continuous Blood Gluc Transmit (DEXCOM G6 TRANSMITTER) MISC 1 each by Does not apply route daily. 1 each 0  . Dulaglutide (TRULICITY) 4.5 FV/4.9SW SOPN Inject 1 pen into the skin once a week. (Patient taking differently: Inject 4.5 mg into the skin once a week. ) 4 pen 11  . furosemide (LASIX) 40 MG tablet Take 1 tablet (40 mg total) by mouth daily. 7 tablet 0  . gabapentin (NEURONTIN) 300 MG capsule TAKE 1 CAPSULE BY MOUTH IN THE MORNING, 2 CAPSULES AT BEDTIME (Patient taking differently: Take 300-600 mg by mouth See admin instructions. TAKE 300 mg capsule by mouth in the morning, then take 300mg  capsule at bedtime, may take an additional 300mg  capsule at at  bedtime if nerve pain continues.) 90 capsule 3  . glipiZIDE (GLUCOTROL) 10 MG tablet Take 1 tablet (10 mg total) by mouth 2 (two) times daily before a meal. 60 tablet 3  . metoprolol tartrate (LOPRESSOR) 25 MG tablet Take 0.5 tablets (12.5 mg total) by mouth 2 (two) times daily. 30 tablet 1  . Multiple Vitamins-Minerals (MULTIVITAMIN ADULTS 50+ PO) Take 1 tablet by mouth daily. Chew    . ondansetron (ZOFRAN-ODT) 8 MG disintegrating tablet Take 1 tablet (8 mg total) by mouth every 8 (eight) hours as needed for nausea. 20 tablet 11  . pantoprazole (PROTONIX) 40 MG tablet Take 1 tablet (40 mg  total) by mouth daily. 90 tablet 3  . potassium chloride (MICRO-K) 10 MEQ CR capsule Take 1 capsule (10 mEq total) by mouth daily. 7 capsule 0  . tamsulosin (FLOMAX) 0.4 MG CAPS capsule TAKE 2 CAPSULES BY MOUTH EVERY DAY (Patient taking differently: Take 0.8 mg by mouth at bedtime. ) 180 capsule 1  . TRELEGY ELLIPTA 100-62.5-25 MCG/INH AEPB INHALE 1 PUFF BY MOUTH EVERY DAY (Patient taking differently: Inhale 1 puff into the lungs daily. ) 60 each 11  . triamcinolone (NASACORT) 55 MCG/ACT AERO nasal inhaler Place 2 sprays into the nose daily. 1 Inhaler 11   No current facility-administered medications for this visit.     Past Medical History:  Diagnosis Date  . Arthritis   . CAD (coronary artery disease)    07/01/2019: LHC with LM disease. Needs CABG.  . Cancer [facility]) 2013   left kidney  . Complication of anesthesia    slow to wake up after kidney stone surgery  . Constipation, chronic   . COPD (chronic obstructive pulmonary disease) ([place])   . Diabetes mellitus ([place])   . Dyspnea    with exertion  . Family history of adverse reaction to anesthesia    daughter couldn't talk for 3-4 days after anesthesia   . Frequent urination   . GERD (gastroesophageal reflux disease)   . Headache   . Hernia   . History of kidney stones   . Hyperlipidemia   . Hypertension   . Peripheral vascular disease ([place])    right leg blood clot 20 plus years ago  . Pneumonia   . Renal insufficiency    stage 2 (only 1 kidney) had Kidney cancer  . Spondylolysis   . Tongue cancer ([place]) 2011    Past Surgical History:  Procedure Laterality Date  . CENTRAL VENOUS CATHETER INSERTION Right 07/12/2019   Procedure: Insertion Central Line Adult;  Surgeon: Ivin Poot, MD;  Location: [place];  Service: Open Heart Surgery;  Laterality: Right;  Right subclavian   . COLONOSCOPY    . CORONARY ARTERY BYPASS GRAFT N/A 07/12/2019   Procedure: CORONARY ARTERY BYPASS GRAFTING (CABG), ON PUMP, TIMES FOUR , USING LEFT  INTERNAL MAMMARY ARTERY AND ENDOSCOPICALLY HARVESTED BILATERAL GREATER SAPHENOUS VEINS;  Surgeon: Ivin Poot, MD;  Location: [place];  Service: Open Heart Surgery;  Laterality: N/A;  . HERNIA REPAIR     abdominal  . KIDNEY STONE SURGERY    . LEFT HEART CATH AND CORONARY ANGIOGRAPHY N/A 07/01/2019   Procedure: LEFT HEART CATH AND CORONARY ANGIOGRAPHY;  Surgeon: Belva Crome, MD;  Location: [place] CV LAB;  Service: Cardiovascular;  Laterality: N/A;  . NEPHRECTOMY Left 10/08/2011   s/p robotic assisted lap left radical nephrectomy 10/08/11  . PARTIAL GLOSSECTOMY  12/07/2009   s/p partial glossectomy for SCC right tongue 12/07/09  .  TEE WITHOUT CARDIOVERSION N/A 07/12/2019   Procedure: TRANSESOPHAGEAL ECHOCARDIOGRAM (TEE);  Surgeon: Prescott Gum, Collier Salina, MD;  Location: [place];  Service: Open Heart Surgery;  Laterality: N/A;    Social History   Socioeconomic History  . Marital status: Married    Spouse name: Not on file  . Number of children: 2  . Years of education: Not on file  . Highest education level: Not on file  Occupational History  . Not on file  Tobacco Use  . Smoking status: Former Smoker    Packs/day: 1.00    Years: 2.00    Pack years: 2.00    Types: Cigarettes  . Smokeless tobacco: Never Used  . Tobacco comment: smoked 3 years as a teenager  Substance and Sexual Activity  . Alcohol use: No  . Drug use: No  . Sexual activity: Never  Other Topics Concern  . Not on file  Social History Narrative  . Not on file   Social Determinants of Health   Financial Resource Strain:   . Difficulty of Paying Living Expenses:   Food Insecurity:   . Worried About Charity fundraiser in the Last Year:   . Arboriculturist in the Last Year:   Transportation Needs:   . Film/video editor (Medical):   Marland Kitchen Lack of Transportation (Non-Medical):   Physical Activity:   . Days of Exercise per Week:   . Minutes of Exercise per Session:   Stress:   . Feeling of Stress :   Social  Connections:   . Frequency of Communication with Friends and Family:   . Frequency of Social Gatherings with Friends and Family:   . Attends Religious Services:   . Active Member of Clubs or Organizations:   . Attends Archivist Meetings:   Marland Kitchen Marital Status:   Intimate Partner Violence:   . Fear of Current or Ex-Partner:   . Emotionally Abused:   Marland Kitchen Physically Abused:   . Sexually Abused:     Family History  Problem Relation Age of Onset  . Cancer Mother        bladder  . Heart attack Father     ROS: no fevers or chills, productive cough, hemoptysis, dysphasia, odynophagia, melena, hematochezia, dysuria, hematuria, rash, seizure activity, orthopnea, PND, pedal edema, claudication. Remaining systems are negative.  Physical Exam: Well-developed well-nourished in no acute distress.  Skin is warm and dry.  HEENT is normal.  Neck is supple.  Chest is clear to auscultation with normal expansion.  Cardiovascular exam is regular rate and rhythm.  Abdominal exam nontender or distended. No masses palpated. Extremities show no edema. neuro grossly intact  ECG- personally reviewed  A/P  1 coronary artery disease status post coronary artery bypass graft-patient doing well following recent surgery.  Continue medical therapy with aspirin and statin.  2 postoperative atrial fibrillation-patient is maintaining sinus rhythm.  I will discontinue amiodarone and follow.  3 hypertension-patient's blood pressure is controlled.  Continue present medical regimen.  4 hyperlipidemia-continue statin.  5 dilated aortic root/pulmonary nodule-plan follow-up noncontrast chest CT February 2022.  6.  Hepatic space nodule-I will arrange a GI evaluation for further recommendations.  7 chronic stage III kidney disease-follow-up primary care.  Kirk Ruths, MD

## 2019-08-08 ENCOUNTER — Telehealth: Payer: Self-pay | Admitting: *Deleted

## 2019-08-08 ENCOUNTER — Other Ambulatory Visit: Payer: Self-pay | Admitting: Cardiothoracic Surgery

## 2019-08-08 DIAGNOSIS — Z951 Presence of aortocoronary bypass graft: Secondary | ICD-10-CM

## 2019-08-08 DIAGNOSIS — R110 Nausea: Secondary | ICD-10-CM | POA: Diagnosis not present

## 2019-08-08 DIAGNOSIS — R9431 Abnormal electrocardiogram [ECG] [EKG]: Secondary | ICD-10-CM | POA: Diagnosis not present

## 2019-08-08 DIAGNOSIS — M6281 Muscle weakness (generalized): Secondary | ICD-10-CM | POA: Diagnosis not present

## 2019-08-08 DIAGNOSIS — R1111 Vomiting without nausea: Secondary | ICD-10-CM | POA: Diagnosis not present

## 2019-08-08 DIAGNOSIS — J90 Pleural effusion, not elsewhere classified: Secondary | ICD-10-CM | POA: Diagnosis not present

## 2019-08-08 DIAGNOSIS — Z20822 Contact with and (suspected) exposure to covid-19: Secondary | ICD-10-CM | POA: Diagnosis not present

## 2019-08-08 DIAGNOSIS — R0602 Shortness of breath: Secondary | ICD-10-CM | POA: Diagnosis not present

## 2019-08-08 DIAGNOSIS — I951 Orthostatic hypotension: Secondary | ICD-10-CM | POA: Diagnosis not present

## 2019-08-08 DIAGNOSIS — E669 Obesity, unspecified: Secondary | ICD-10-CM | POA: Diagnosis not present

## 2019-08-08 DIAGNOSIS — R918 Other nonspecific abnormal finding of lung field: Secondary | ICD-10-CM | POA: Diagnosis not present

## 2019-08-08 DIAGNOSIS — I2510 Atherosclerotic heart disease of native coronary artery without angina pectoris: Secondary | ICD-10-CM

## 2019-08-08 DIAGNOSIS — S3993XA Unspecified injury of pelvis, initial encounter: Secondary | ICD-10-CM | POA: Diagnosis not present

## 2019-08-08 DIAGNOSIS — I959 Hypotension, unspecified: Secondary | ICD-10-CM | POA: Diagnosis not present

## 2019-08-08 DIAGNOSIS — I517 Cardiomegaly: Secondary | ICD-10-CM | POA: Diagnosis not present

## 2019-08-08 DIAGNOSIS — E1122 Type 2 diabetes mellitus with diabetic chronic kidney disease: Secondary | ICD-10-CM | POA: Diagnosis not present

## 2019-08-08 DIAGNOSIS — R0902 Hypoxemia: Secondary | ICD-10-CM | POA: Diagnosis not present

## 2019-08-08 DIAGNOSIS — N1832 Chronic kidney disease, stage 3b: Secondary | ICD-10-CM | POA: Diagnosis not present

## 2019-08-08 DIAGNOSIS — R0989 Other specified symptoms and signs involving the circulatory and respiratory systems: Secondary | ICD-10-CM | POA: Diagnosis not present

## 2019-08-08 DIAGNOSIS — I129 Hypertensive chronic kidney disease with stage 1 through stage 4 chronic kidney disease, or unspecified chronic kidney disease: Secondary | ICD-10-CM | POA: Diagnosis not present

## 2019-08-08 DIAGNOSIS — R52 Pain, unspecified: Secondary | ICD-10-CM | POA: Diagnosis not present

## 2019-08-08 DIAGNOSIS — R55 Syncope and collapse: Secondary | ICD-10-CM | POA: Diagnosis not present

## 2019-08-08 NOTE — Telephone Encounter (Addendum)
Becky with [facility] called to relay Mr. Clinton Ortiz condition.  She said he had fallen and she suspected he had broken a toe.  He is weak, dizzy and his BP is only 78/60. As she was trying to talk with me, she kept having to ask Clinton Ortiz if he was alright several times. Finally she told me she felt she should call an ambulance.  Clinton Ortiz is s/p CABG X 4, 07/12/19, a patient of Dr. Nils Pyle.

## 2019-08-09 DIAGNOSIS — I313 Pericardial effusion (noninflammatory): Secondary | ICD-10-CM | POA: Diagnosis not present

## 2019-08-09 DIAGNOSIS — R55 Syncope and collapse: Secondary | ICD-10-CM | POA: Diagnosis not present

## 2019-08-10 ENCOUNTER — Ambulatory Visit: Payer: Self-pay | Admitting: Cardiothoracic Surgery

## 2019-08-10 ENCOUNTER — Ambulatory Visit: Payer: Medicare Other | Admitting: Cardiology

## 2019-08-10 DIAGNOSIS — Z6838 Body mass index (BMI) 38.0-38.9, adult: Secondary | ICD-10-CM | POA: Diagnosis not present

## 2019-08-10 DIAGNOSIS — D649 Anemia, unspecified: Secondary | ICD-10-CM | POA: Diagnosis present

## 2019-08-10 DIAGNOSIS — Z951 Presence of aortocoronary bypass graft: Secondary | ICD-10-CM | POA: Diagnosis not present

## 2019-08-10 DIAGNOSIS — Z7984 Long term (current) use of oral hypoglycemic drugs: Secondary | ICD-10-CM | POA: Diagnosis not present

## 2019-08-10 DIAGNOSIS — Z880 Allergy status to penicillin: Secondary | ICD-10-CM | POA: Diagnosis not present

## 2019-08-10 DIAGNOSIS — Z888 Allergy status to other drugs, medicaments and biological substances status: Secondary | ICD-10-CM | POA: Diagnosis not present

## 2019-08-10 DIAGNOSIS — M6281 Muscle weakness (generalized): Secondary | ICD-10-CM | POA: Diagnosis not present

## 2019-08-10 DIAGNOSIS — E1122 Type 2 diabetes mellitus with diabetic chronic kidney disease: Secondary | ICD-10-CM | POA: Diagnosis present

## 2019-08-10 DIAGNOSIS — N1832 Chronic kidney disease, stage 3b: Secondary | ICD-10-CM | POA: Diagnosis not present

## 2019-08-10 DIAGNOSIS — R778 Other specified abnormalities of plasma proteins: Secondary | ICD-10-CM | POA: Diagnosis not present

## 2019-08-10 DIAGNOSIS — Z87891 Personal history of nicotine dependence: Secondary | ICD-10-CM | POA: Diagnosis not present

## 2019-08-10 DIAGNOSIS — J449 Chronic obstructive pulmonary disease, unspecified: Secondary | ICD-10-CM | POA: Diagnosis present

## 2019-08-10 DIAGNOSIS — I25810 Atherosclerosis of coronary artery bypass graft(s) without angina pectoris: Secondary | ICD-10-CM | POA: Diagnosis not present

## 2019-08-10 DIAGNOSIS — I129 Hypertensive chronic kidney disease with stage 1 through stage 4 chronic kidney disease, or unspecified chronic kidney disease: Secondary | ICD-10-CM | POA: Diagnosis present

## 2019-08-10 DIAGNOSIS — I313 Pericardial effusion (noninflammatory): Secondary | ICD-10-CM | POA: Diagnosis not present

## 2019-08-10 DIAGNOSIS — Z955 Presence of coronary angioplasty implant and graft: Secondary | ICD-10-CM | POA: Diagnosis not present

## 2019-08-10 DIAGNOSIS — Z7951 Long term (current) use of inhaled steroids: Secondary | ICD-10-CM | POA: Diagnosis not present

## 2019-08-10 DIAGNOSIS — Z79899 Other long term (current) drug therapy: Secondary | ICD-10-CM | POA: Diagnosis not present

## 2019-08-10 DIAGNOSIS — I951 Orthostatic hypotension: Secondary | ICD-10-CM | POA: Diagnosis not present

## 2019-08-10 DIAGNOSIS — I2510 Atherosclerotic heart disease of native coronary artery without angina pectoris: Secondary | ICD-10-CM | POA: Diagnosis present

## 2019-08-10 DIAGNOSIS — E11649 Type 2 diabetes mellitus with hypoglycemia without coma: Secondary | ICD-10-CM | POA: Diagnosis present

## 2019-08-10 DIAGNOSIS — Z7982 Long term (current) use of aspirin: Secondary | ICD-10-CM | POA: Diagnosis not present

## 2019-08-10 DIAGNOSIS — I10 Essential (primary) hypertension: Secondary | ICD-10-CM | POA: Diagnosis not present

## 2019-08-10 DIAGNOSIS — I480 Paroxysmal atrial fibrillation: Secondary | ICD-10-CM | POA: Diagnosis not present

## 2019-08-10 DIAGNOSIS — Z7902 Long term (current) use of antithrombotics/antiplatelets: Secondary | ICD-10-CM | POA: Diagnosis not present

## 2019-08-10 DIAGNOSIS — E785 Hyperlipidemia, unspecified: Secondary | ICD-10-CM | POA: Diagnosis not present

## 2019-08-10 DIAGNOSIS — E669 Obesity, unspecified: Secondary | ICD-10-CM | POA: Diagnosis present

## 2019-08-10 DIAGNOSIS — N400 Enlarged prostate without lower urinary tract symptoms: Secondary | ICD-10-CM | POA: Diagnosis not present

## 2019-08-10 DIAGNOSIS — Z20822 Contact with and (suspected) exposure to covid-19: Secondary | ICD-10-CM | POA: Diagnosis present

## 2019-08-10 DIAGNOSIS — R55 Syncope and collapse: Secondary | ICD-10-CM | POA: Diagnosis not present

## 2019-08-10 MED ORDER — ALBUTEROL SULFATE (2.5 MG/3ML) 0.083% IN NEBU
2.50 | INHALATION_SOLUTION | RESPIRATORY_TRACT | Status: DC
Start: ? — End: 2019-08-10

## 2019-08-10 MED ORDER — GENERIC EXTERNAL MEDICATION
Status: DC
Start: ? — End: 2019-08-10

## 2019-08-10 MED ORDER — ASPIRIN 81 MG PO CHEW
81.00 | CHEWABLE_TABLET | ORAL | Status: DC
Start: 2019-08-14 — End: 2019-08-10

## 2019-08-10 MED ORDER — ATORVASTATIN CALCIUM 80 MG PO TABS
80.00 | ORAL_TABLET | ORAL | Status: DC
Start: 2019-08-13 — End: 2019-08-10

## 2019-08-10 MED ORDER — POLYETHYLENE GLYCOL 3350 17 GM/SCOOP PO POWD
17.00 | ORAL | Status: DC
Start: ? — End: 2019-08-10

## 2019-08-10 MED ORDER — CLOPIDOGREL BISULFATE 75 MG PO TABS
75.00 | ORAL_TABLET | ORAL | Status: DC
Start: 2019-08-11 — End: 2019-08-10

## 2019-08-10 MED ORDER — METOPROLOL TARTRATE 25 MG PO TABS
12.50 | ORAL_TABLET | ORAL | Status: DC
Start: 2019-08-10 — End: 2019-08-10

## 2019-08-10 MED ORDER — AMIODARONE HCL 200 MG PO TABS
200.00 | ORAL_TABLET | ORAL | Status: DC
Start: 2019-08-10 — End: 2019-08-10

## 2019-08-10 MED ORDER — SODIUM CHLORIDE 0.9 % IV SOLN
10.00 | INTRAVENOUS | Status: DC
Start: ? — End: 2019-08-10

## 2019-08-10 MED ORDER — UMECLIDINIUM-VILANTEROL 62.5-25 MCG/INH IN AEPB
1.00 | INHALATION_SPRAY | RESPIRATORY_TRACT | Status: DC
Start: 2019-08-14 — End: 2019-08-10

## 2019-08-10 MED ORDER — PANTOPRAZOLE SODIUM 40 MG PO TBEC
40.00 | DELAYED_RELEASE_TABLET | ORAL | Status: DC
Start: 2019-08-14 — End: 2019-08-10

## 2019-08-11 ENCOUNTER — Other Ambulatory Visit: Payer: Self-pay | Admitting: Surgical

## 2019-08-11 ENCOUNTER — Other Ambulatory Visit: Payer: Self-pay | Admitting: Sports Medicine

## 2019-08-11 DIAGNOSIS — R079 Chest pain, unspecified: Secondary | ICD-10-CM

## 2019-08-12 ENCOUNTER — Other Ambulatory Visit: Payer: Self-pay | Admitting: Sports Medicine

## 2019-08-12 MED ORDER — GENERIC EXTERNAL MEDICATION
Status: DC
Start: ? — End: 2019-08-12

## 2019-08-12 MED ORDER — METOPROLOL TARTRATE 25 MG PO TABS
25.00 | ORAL_TABLET | ORAL | Status: DC
Start: 2019-08-13 — End: 2019-08-12

## 2019-08-12 MED ORDER — APIXABAN 5 MG PO TABS
5.00 | ORAL_TABLET | ORAL | Status: DC
Start: 2019-08-13 — End: 2019-08-12

## 2019-08-12 MED ORDER — AMIODARONE HCL 200 MG PO TABS
200.00 | ORAL_TABLET | ORAL | Status: DC
Start: 2019-08-13 — End: 2019-08-12

## 2019-08-12 MED ORDER — MUPIROCIN 2 % EX OINT
TOPICAL_OINTMENT | CUTANEOUS | Status: DC
Start: 2019-08-13 — End: 2019-08-12

## 2019-08-12 MED ORDER — GENERIC EXTERNAL MEDICATION
0.50 | Status: DC
Start: ? — End: 2019-08-12

## 2019-08-12 MED ORDER — DILTIAZEM HCL ER BEADS 120 MG PO CP24
120.00 | ORAL_CAPSULE | ORAL | Status: DC
Start: 2019-08-14 — End: 2019-08-12

## 2019-08-12 MED ORDER — TEMAZEPAM 15 MG PO CAPS
15.00 | ORAL_CAPSULE | ORAL | Status: DC
Start: ? — End: 2019-08-12

## 2019-08-12 MED ORDER — ONDANSETRON HCL 4 MG PO TABS
4.00 | ORAL_TABLET | ORAL | Status: DC
Start: ? — End: 2019-08-12

## 2019-08-14 MED ORDER — GENERIC EXTERNAL MEDICATION
Status: DC
Start: ? — End: 2019-08-14

## 2019-08-15 DIAGNOSIS — I10 Essential (primary) hypertension: Secondary | ICD-10-CM | POA: Diagnosis not present

## 2019-08-15 DIAGNOSIS — N184 Chronic kidney disease, stage 4 (severe): Secondary | ICD-10-CM | POA: Diagnosis not present

## 2019-08-15 MED ORDER — GENERIC EXTERNAL MEDICATION
Status: DC
Start: ? — End: 2019-08-15

## 2019-08-16 DIAGNOSIS — N1832 Chronic kidney disease, stage 3b: Secondary | ICD-10-CM | POA: Diagnosis not present

## 2019-08-16 DIAGNOSIS — Z48812 Encounter for surgical aftercare following surgery on the circulatory system: Secondary | ICD-10-CM | POA: Diagnosis not present

## 2019-08-16 DIAGNOSIS — N184 Chronic kidney disease, stage 4 (severe): Secondary | ICD-10-CM | POA: Diagnosis not present

## 2019-08-19 ENCOUNTER — Encounter: Payer: Self-pay | Admitting: Sports Medicine

## 2019-08-19 ENCOUNTER — Ambulatory Visit (INDEPENDENT_AMBULATORY_CARE_PROVIDER_SITE_OTHER): Payer: Medicare Other | Admitting: Sports Medicine

## 2019-08-19 ENCOUNTER — Other Ambulatory Visit: Payer: Self-pay

## 2019-08-19 DIAGNOSIS — E039 Hypothyroidism, unspecified: Secondary | ICD-10-CM | POA: Diagnosis not present

## 2019-08-19 DIAGNOSIS — E7800 Pure hypercholesterolemia, unspecified: Secondary | ICD-10-CM | POA: Diagnosis not present

## 2019-08-19 DIAGNOSIS — F5105 Insomnia due to other mental disorder: Secondary | ICD-10-CM | POA: Insufficient documentation

## 2019-08-19 DIAGNOSIS — I10 Essential (primary) hypertension: Secondary | ICD-10-CM

## 2019-08-19 DIAGNOSIS — I2510 Atherosclerotic heart disease of native coronary artery without angina pectoris: Secondary | ICD-10-CM | POA: Diagnosis not present

## 2019-08-19 DIAGNOSIS — N183 Chronic kidney disease, stage 3 unspecified: Secondary | ICD-10-CM | POA: Diagnosis not present

## 2019-08-19 DIAGNOSIS — F5101 Primary insomnia: Secondary | ICD-10-CM | POA: Diagnosis not present

## 2019-08-19 DIAGNOSIS — F4321 Adjustment disorder with depressed mood: Secondary | ICD-10-CM | POA: Insufficient documentation

## 2019-08-19 DIAGNOSIS — E1122 Type 2 diabetes mellitus with diabetic chronic kidney disease: Secondary | ICD-10-CM | POA: Diagnosis not present

## 2019-08-19 DIAGNOSIS — G4700 Insomnia, unspecified: Secondary | ICD-10-CM | POA: Insufficient documentation

## 2019-08-19 DIAGNOSIS — E1121 Type 2 diabetes mellitus with diabetic nephropathy: Secondary | ICD-10-CM | POA: Diagnosis not present

## 2019-08-19 DIAGNOSIS — E038 Other specified hypothyroidism: Secondary | ICD-10-CM

## 2019-08-19 MED ORDER — TEMAZEPAM 7.5 MG PO CAPS: 7.5000 mg | ORAL_CAPSULE | Freq: Every evening | ORAL | 3 refills | Status: DC | PRN

## 2019-08-19 MED ORDER — PRAVASTATIN SODIUM 80 MG PO TABS: 80.0000 mg | ORAL_TABLET | Freq: Every day | ORAL | 3 refills | Status: DC

## 2019-08-19 NOTE — Assessment & Plan Note (Signed)
Clinton Ortiz has subclinical hypothyroidism, this is likely secondary to his amiodarone treatment. We will simply watch this for now.

## 2019-08-19 NOTE — Assessment & Plan Note (Signed)
Antwane is on high-dose atorvastatin post CABG, he is having intolerable myalgias. Switching back to pravastatin but still 80 mg.

## 2019-08-19 NOTE — Progress Notes (Addendum)
    Procedures performed today:    None.  Independent interpretation of notes and tests performed by another provider:   None.  Brief History, Exam, Impression, and Recommendations:    Hypertension This is a pleasant 79 year old male, this is a hospital follow-up, he recalls standing up to check temperature, he syncopized and woke up on the floor.  Subsequently he had home health PT sessions and they noted a systolic blood pressure in 70s with persistent orthostasis. He was hospitalized, hydrated, and symptoms improved. He is currently on amiodarone for his atrial fibrillation, as well as metoprolol twice a day. He is still profoundly orthostatic and has fallen a few more times. Continue amiodarone for now, we will leave this in the hands of cardiology however I do think we need to drop his metoprolol dose in half. Continue diltiazem for now. He is not on any diuretics, he is euvolemic on exam today, he will aggressively hydrate as well. He does understand to only ambulate with his walker. Certain if this continues we will need to discontinue his Flomax.  CKD stage 3 due to type 2 diabetes mellitus and post unilateral nephrectomy for renal cell carcinoma Renal function is fairly stable, he is followed by nephrology, he is also post unilateral nephrectomy for renal cell carcinoma.  Controlled type 2 diabetes mellitus with diabetic nephropathy (HCC) Hemoglobin A1c was 6.5% in the hospital, glipizide was decreased and Trulicity was discontinued. It is 6.5 because of the Trulicity so we are going to restart Trulicity 4.5 mg q. weekly and I am okay with completely discontinuing his glipizide for now.  Hyperlipidemia Orel is on high-dose atorvastatin post CABG, he is having intolerable myalgias. Switching back to pravastatin but still 80 mg.  Subclinical hypothyroidism Kysen has subclinical hypothyroidism, this is likely secondary to his amiodarone treatment. We will simply watch this  for now.  Insomnia Keiandre was given Restoril in the hospital, I did explain the risk of increasing falls, so we will start at the lowest possible dose. His insomnia is intolerable.    ___________________________________________ Gwen Her. Dianah Field, M.D., ABFM., CAQSM. Primary Care and Icard Instructor of [place] [facility] of [employer]

## 2019-08-19 NOTE — Assessment & Plan Note (Signed)
Hemoglobin A1c was 6.5% in the hospital, glipizide was decreased and Trulicity was discontinued. It is 6.5 because of the Trulicity so we are going to restart Trulicity 4.5 mg q. weekly and I am okay with completely discontinuing his glipizide for now.

## 2019-08-19 NOTE — Addendum Note (Signed)
Addended by: Silverio Decamp on: 08/19/2019 04:01 PM   Modules accepted: Orders

## 2019-08-19 NOTE — Assessment & Plan Note (Signed)
Renal function is fairly stable, he is followed by nephrology, he is also post unilateral nephrectomy for renal cell carcinoma.

## 2019-08-19 NOTE — Assessment & Plan Note (Signed)
Clinton Ortiz was given Restoril in the hospital, I did explain the risk of increasing falls, so we will start at the lowest possible dose. His insomnia is intolerable.

## 2019-08-19 NOTE — Assessment & Plan Note (Addendum)
This is a pleasant 79 year old male, this is a hospital follow-up, he recalls standing up to check temperature, he syncopized and woke up on the floor.  Subsequently he had home health PT sessions and they noted a systolic blood pressure in 70s with persistent orthostasis. He was hospitalized, hydrated, and symptoms improved. He is currently on amiodarone for his atrial fibrillation, as well as metoprolol twice a day. He is still profoundly orthostatic and has fallen a few more times. Continue amiodarone for now, we will leave this in the hands of cardiology however I do think we need to drop his metoprolol dose in half. Continue diltiazem for now. He is not on any diuretics, he is euvolemic on exam today, he will aggressively hydrate as well. He does understand to only ambulate with his walker. Certain if this continues we will need to discontinue his Flomax.

## 2019-08-20 DIAGNOSIS — Z951 Presence of aortocoronary bypass graft: Secondary | ICD-10-CM | POA: Diagnosis not present

## 2019-08-20 DIAGNOSIS — N400 Enlarged prostate without lower urinary tract symptoms: Secondary | ICD-10-CM | POA: Diagnosis not present

## 2019-08-20 DIAGNOSIS — Z86718 Personal history of other venous thrombosis and embolism: Secondary | ICD-10-CM | POA: Diagnosis not present

## 2019-08-20 DIAGNOSIS — Z794 Long term (current) use of insulin: Secondary | ICD-10-CM | POA: Diagnosis not present

## 2019-08-20 DIAGNOSIS — J449 Chronic obstructive pulmonary disease, unspecified: Secondary | ICD-10-CM | POA: Diagnosis not present

## 2019-08-20 DIAGNOSIS — E1151 Type 2 diabetes mellitus with diabetic peripheral angiopathy without gangrene: Secondary | ICD-10-CM | POA: Diagnosis not present

## 2019-08-20 DIAGNOSIS — Z95818 Presence of other cardiac implants and grafts: Secondary | ICD-10-CM | POA: Diagnosis not present

## 2019-08-20 DIAGNOSIS — E1122 Type 2 diabetes mellitus with diabetic chronic kidney disease: Secondary | ICD-10-CM | POA: Diagnosis not present

## 2019-08-20 DIAGNOSIS — Z85528 Personal history of other malignant neoplasm of kidney: Secondary | ICD-10-CM | POA: Diagnosis not present

## 2019-08-20 DIAGNOSIS — Z6839 Body mass index (BMI) 39.0-39.9, adult: Secondary | ICD-10-CM | POA: Diagnosis not present

## 2019-08-20 DIAGNOSIS — I25119 Atherosclerotic heart disease of native coronary artery with unspecified angina pectoris: Secondary | ICD-10-CM | POA: Diagnosis not present

## 2019-08-20 DIAGNOSIS — N182 Chronic kidney disease, stage 2 (mild): Secondary | ICD-10-CM | POA: Diagnosis not present

## 2019-08-20 DIAGNOSIS — Z7902 Long term (current) use of antithrombotics/antiplatelets: Secondary | ICD-10-CM | POA: Diagnosis not present

## 2019-08-20 DIAGNOSIS — Z48812 Encounter for surgical aftercare following surgery on the circulatory system: Secondary | ICD-10-CM | POA: Diagnosis not present

## 2019-08-20 DIAGNOSIS — K5900 Constipation, unspecified: Secondary | ICD-10-CM | POA: Diagnosis not present

## 2019-08-20 DIAGNOSIS — Z905 Acquired absence of kidney: Secondary | ICD-10-CM | POA: Diagnosis not present

## 2019-08-24 ENCOUNTER — Ambulatory Visit
Admission: RE | Admit: 2019-08-24 | Discharge: 2019-08-24 | Disposition: A | Discharge status: Home / Self Care | Payer: Medicare Other | Source: Ambulatory Visit | Attending: Cardiothoracic Surgery | Admitting: Cardiothoracic Surgery

## 2019-08-24 ENCOUNTER — Ambulatory Visit: Payer: Self-pay | Admitting: Cardiothoracic Surgery

## 2019-08-24 ENCOUNTER — Other Ambulatory Visit: Payer: Self-pay | Admitting: Sports Medicine

## 2019-08-24 ENCOUNTER — Encounter: Payer: Self-pay | Admitting: Cardiothoracic Surgery

## 2019-08-24 ENCOUNTER — Ambulatory Visit (INDEPENDENT_AMBULATORY_CARE_PROVIDER_SITE_OTHER): Payer: Self-pay | Admitting: Cardiothoracic Surgery

## 2019-08-24 ENCOUNTER — Other Ambulatory Visit: Payer: Self-pay

## 2019-08-24 DIAGNOSIS — E1121 Type 2 diabetes mellitus with diabetic nephropathy: Secondary | ICD-10-CM

## 2019-08-24 DIAGNOSIS — Z951 Presence of aortocoronary bypass graft: Secondary | ICD-10-CM

## 2019-08-24 DIAGNOSIS — I4891 Unspecified atrial fibrillation: Secondary | ICD-10-CM | POA: Insufficient documentation

## 2019-08-24 DIAGNOSIS — I4819 Other persistent atrial fibrillation: Secondary | ICD-10-CM

## 2019-08-24 DIAGNOSIS — J90 Pleural effusion, not elsewhere classified: Secondary | ICD-10-CM | POA: Diagnosis not present

## 2019-08-24 NOTE — Progress Notes (Signed)
PCP is Silverio Decamp, MD Referring Provider is Lelon Perla, MD  Chief Complaint  Patient presents with  . Routine Post Op    s/p CABG x 4 on 07/12/19, CXR today    HPI: Patient returns for scheduled postop visit after multivessel CABG  March 2nd.  The patient is an obese diabetic with history of nephrectomy for malignancy.  Patient had transient postoperative atrial fibrillation in the hospital but converted to sinus rhythm on amiodarone prior to discharge.  He was not discharged on NOAC.  Shortly after returning home he had a syncopal event and was found to be in atrial fibrillation at [facility].  He was admitted and treated with rate control for his atrial fibrillation and also given fluids for dehydration.  He was started on Eliquis and his Plavix started after CABG discharge was discontinued.  He feels better since released from [facility] 2 weeks ago.  He is still having some orthostatic dizziness and had one episode of a fall over 2 weeks ago. He has lost weight but still has some mild ankle edema.  He has baseline renal insufficiency from previous nephrectomy for malignancy.  His rhythm strip today shows him to be in atrial fibrillation heart rate 100.  Chest x-ray today shows mild bilateral pleural effusions with some elevation of left hemidiaphragm.  No pulmonary edema.  Patient is currently not on Lasix after his rehospitalization.  He is taking amiodarone twice daily.  Sternal incision leg incisions are healing well.  No bleeding problems with Eliquis.  Patient needs a medication adjustment done to be set up with cardiology at [facility] for outpatient cardioversion for atrial fibrillation.  He has been on Eliquis for approximately 3 weeks.  Because of the patient's fall last week we will stop his Flomax and stop his low-dose Cardizem  continue metoprolol and amiodarone.    Past Medical History:  Diagnosis Date  . Arthritis   . CAD (coronary artery disease)     07/01/2019: LHC with LM disease. Needs CABG.  . Cancer [facility]) 2013   left kidney  . Complication of anesthesia    slow to wake up after kidney stone surgery  . Constipation, chronic   . COPD (chronic obstructive pulmonary disease) ([place])   . Diabetes mellitus ([place])   . Dyspnea    with exertion  . Family history of adverse reaction to anesthesia    daughter couldn't talk for 3-4 days after anesthesia   . Frequent urination   . GERD (gastroesophageal reflux disease)   . Headache   . Hernia   . History of kidney stones   . Hyperlipidemia   . Hypertension   . Peripheral vascular disease ([place])    right leg blood clot 20 plus years ago  . Pneumonia   . Renal insufficiency    stage 2 (only 1 kidney) had Kidney cancer  . Spondylolysis   . Tongue cancer ([place]) 2011    Past Surgical History:  Procedure Laterality Date  . CENTRAL VENOUS CATHETER INSERTION Right 07/12/2019   Procedure: Insertion Central Line Adult;  Surgeon: Ivin Poot, MD;  Location: [place];  Service: Open Heart Surgery;  Laterality: Right;  Right subclavian   . COLONOSCOPY    . CORONARY ARTERY BYPASS GRAFT N/A 07/12/2019   Procedure: CORONARY ARTERY BYPASS GRAFTING (CABG), ON PUMP, TIMES FOUR , USING LEFT INTERNAL MAMMARY ARTERY AND ENDOSCOPICALLY HARVESTED BILATERAL GREATER SAPHENOUS VEINS;  Surgeon: Ivin Poot, MD;  Location: [place];  Service: Open Heart Surgery;  Laterality: N/A;  . HERNIA REPAIR     abdominal  . KIDNEY STONE SURGERY    . LEFT HEART CATH AND CORONARY ANGIOGRAPHY N/A 07/01/2019   Procedure: LEFT HEART CATH AND CORONARY ANGIOGRAPHY;  Surgeon: Belva Crome, MD;  Location: [place] CV LAB;  Service: Cardiovascular;  Laterality: N/A;  . NEPHRECTOMY Left 10/08/2011   s/p robotic assisted lap left radical nephrectomy 10/08/11  . PARTIAL GLOSSECTOMY  12/07/2009   s/p partial glossectomy for SCC right tongue 12/07/09  . TEE WITHOUT CARDIOVERSION N/A 07/12/2019   Procedure: TRANSESOPHAGEAL  ECHOCARDIOGRAM (TEE);  Surgeon: Prescott Gum, Collier Salina, MD;  Location: [place];  Service: Open Heart Surgery;  Laterality: N/A;    Family History  Problem Relation Age of Onset  . Cancer Mother        bladder  . Heart attack Father     Social History Social History   Tobacco Use  . Smoking status: Former Smoker    Packs/day: 1.00    Years: 2.00    Pack years: 2.00    Types: Cigarettes  . Smokeless tobacco: Never Used  . Tobacco comment: smoked 3 years as a teenager  Substance Use Topics  . Alcohol use: No  . Drug use: No    Current Outpatient Medications  Medication Sig Dispense Refill  . acetaminophen (TYLENOL) 325 MG tablet Take 650 mg by mouth every 6 (six) hours as needed for mild pain or headache.     . Albuterol Sulfate (PROAIR RESPICLICK) 258 (90 BASE) MCG/ACT AEPB Inhale 2 puffs into the lungs every 4 (four) hours as needed. (Patient taking differently: Inhale 2 puffs into the lungs every 4 (four) hours as needed (shortness of breath). ) 1 each 0  . amiodarone (PACERONE) 200 MG tablet Take 1 tablet (200 mg total) by mouth 2 (two) times daily. 60 tablet 1  . apixaban (ELIQUIS) 5 MG TABS tablet Take 5 mg by mouth daily.    Marland Kitchen aspirin EC 81 MG EC tablet Take 1 tablet (81 mg total) by mouth daily.    Marland Kitchen azelastine (ASTELIN) 0.1 % nasal spray SPRAY 2 SPRAYS INTO EACH NOSTRIL TWICE A DAY AS DIRECTED (Patient taking differently: Place 2 sprays into both nostrils daily. ) 30 mL 1  . B Complex Vitamins (B COMPLEX PO) Take 1 tablet by mouth daily.     . Cholecalciferol (VITAMIN D3) 10 MCG (400 UNIT) CHEW Chew by mouth.    . Continuous Blood Gluc Receiver ([place]) DEVI 1 each by Does not apply route daily. 1 each 0  . Continuous Blood Gluc Sensor (DEXCOM G6 SENSOR) MISC 1 each by Does not apply route daily. 3 each 0  . Continuous Blood Gluc Transmit (DEXCOM G6 TRANSMITTER) MISC 1 each by Does not apply route daily. 1 each 0  . diltiazem (CARDIZEM CD) 120 MG 24 hr capsule     .  Dulaglutide (TRULICITY) 4.5 NI/7.7OE SOPN Inject 1 pen into the skin once a week. 4 pen 0  . gabapentin (NEURONTIN) 300 MG capsule Take 300 mg by mouth 2 (two) times daily.    . metoprolol tartrate (LOPRESSOR) 25 MG tablet Take 0.5 tablets (12.5 mg total) by mouth 2 (two) times daily. 30 tablet 1  . Multiple Vitamins-Minerals (MULTIVITAMIN ADULTS 50+ PO) Take 1 tablet by mouth daily. Chew    . nitroGLYCERIN (NITROSTAT) 0.4 MG SL tablet PLEASE SEE ATTACHED FOR DETAILED DIRECTIONS    . ondansetron (ZOFRAN-ODT) 8 MG  disintegrating tablet Take 1 tablet (8 mg total) by mouth every 8 (eight) hours as needed for nausea. 20 tablet 11  . pantoprazole (PROTONIX) 40 MG tablet Take 1 tablet (40 mg total) by mouth daily. 90 tablet 3  . pravastatin (PRAVACHOL) 80 MG tablet Take 1 tablet (80 mg total) by mouth daily. 90 tablet 3  . tamsulosin (FLOMAX) 0.4 MG CAPS capsule Take 0.4 mg by mouth daily.    . temazepam (RESTORIL) 7.5 MG capsule Take 1 capsule (7.5 mg total) by mouth at bedtime as needed for sleep. 30 capsule 3  . TRELEGY ELLIPTA 100-62.5-25 MCG/INH AEPB INHALE 1 PUFF BY MOUTH EVERY DAY (Patient taking differently: Inhale 1 puff into the lungs daily. ) 60 each 11  . triamcinolone (NASACORT) 55 MCG/ACT AERO nasal inhaler PLACE 2 SPRAYS INTO THE NOSE DAILY. 1 Inhaler 11   No current facility-administered medications for this visit.    Allergies  Allergen Reactions  . Flavoring Agent Other (See Comments)    makes him feel funny    Review of Systems  BP 106/71 (BP Location: Right Arm, Patient Position: Sitting, Cuff Size: Large)   Pulse (!) 109 Comment: irregular  Temp (!) 97.3 F (36.3 C) (Temporal)   Resp (!) 24   Ht 5\' 7"  (1.702 m)   Wt 245 lb (111.1 kg)   SpO2 94% Comment: RA  BMI 38.37 kg/m  Physical Exam      Exam    General- alert and comfortable.  Sternal incision well-healed.    Neck- no JVD, no cervical adenopathy palpable, no carotid bruit   Lungs- clear without rales,  wheezes   Cor- irregular rate and rhythm, no murmur , gallop   Abdomen- soft, non-tender   Extremities - warm, non-tender, minimal edema   Neuro- oriented, appropriate, no focal weakness    Diagnostic Tests: Chest x-ray shows no pulmonary edema, mild bilateral pleural effusions with left hemidiaphragm elevation.  Sternal wires intact Rhythm strip shows atrial fibrillation heart rate 100 Impression: Recurrent atrial fibrillation after discharge.  Patient continues in atrial fibrillation on Eliquis and needs to be set up for outpatient TEE-cardioversion.  Will refer the patient to the cardiology clinic for this procedure and then follow the patient back up here in a month.  Plan: Continue Eliquis and aspirin 81 mg and amiodarone and metoprolol 12.5 twice daily.  Medication changes reviewed with patient's daughter.  He is encouraged to walk with his walker but not to drive.  He should not lift more than 10 pounds.   Len Childs, MD [facility] [phone]

## 2019-08-29 ENCOUNTER — Encounter (HOSPITAL_COMMUNITY): Payer: Self-pay | Admitting: Physician Assistant

## 2019-08-29 ENCOUNTER — Other Ambulatory Visit: Payer: Self-pay

## 2019-08-29 ENCOUNTER — Ambulatory Visit (HOSPITAL_COMMUNITY)
Admission: RE | Admit: 2019-08-29 | Discharge: 2019-08-29 | Disposition: A | Discharge status: Home / Self Care | Payer: Medicare Other | Source: Ambulatory Visit | Attending: Physician Assistant | Admitting: Physician Assistant

## 2019-08-29 ENCOUNTER — Telehealth: Payer: Self-pay | Admitting: Cardiology

## 2019-08-29 VITALS — BP 144/90 | HR 116 | Ht 67.0 in | Wt 280.2 lb

## 2019-08-29 DIAGNOSIS — E1151 Type 2 diabetes mellitus with diabetic peripheral angiopathy without gangrene: Secondary | ICD-10-CM | POA: Insufficient documentation

## 2019-08-29 DIAGNOSIS — I2510 Atherosclerotic heart disease of native coronary artery without angina pectoris: Secondary | ICD-10-CM | POA: Insufficient documentation

## 2019-08-29 DIAGNOSIS — Z85810 Personal history of malignant neoplasm of tongue: Secondary | ICD-10-CM | POA: Diagnosis not present

## 2019-08-29 DIAGNOSIS — D6869 Other thrombophilia: Secondary | ICD-10-CM | POA: Insufficient documentation

## 2019-08-29 DIAGNOSIS — Z85528 Personal history of other malignant neoplasm of kidney: Secondary | ICD-10-CM | POA: Diagnosis not present

## 2019-08-29 DIAGNOSIS — Z7982 Long term (current) use of aspirin: Secondary | ICD-10-CM | POA: Insufficient documentation

## 2019-08-29 DIAGNOSIS — Z87891 Personal history of nicotine dependence: Secondary | ICD-10-CM | POA: Insufficient documentation

## 2019-08-29 DIAGNOSIS — Z8249 Family history of ischemic heart disease and other diseases of the circulatory system: Secondary | ICD-10-CM | POA: Insufficient documentation

## 2019-08-29 DIAGNOSIS — Z7901 Long term (current) use of anticoagulants: Secondary | ICD-10-CM | POA: Insufficient documentation

## 2019-08-29 DIAGNOSIS — J449 Chronic obstructive pulmonary disease, unspecified: Secondary | ICD-10-CM | POA: Insufficient documentation

## 2019-08-29 DIAGNOSIS — I4891 Unspecified atrial fibrillation: Secondary | ICD-10-CM | POA: Diagnosis present

## 2019-08-29 DIAGNOSIS — Z8052 Family history of malignant neoplasm of bladder: Secondary | ICD-10-CM | POA: Insufficient documentation

## 2019-08-29 DIAGNOSIS — I4819 Other persistent atrial fibrillation: Secondary | ICD-10-CM | POA: Diagnosis not present

## 2019-08-29 DIAGNOSIS — M1990 Unspecified osteoarthritis, unspecified site: Secondary | ICD-10-CM | POA: Diagnosis not present

## 2019-08-29 DIAGNOSIS — Z951 Presence of aortocoronary bypass graft: Secondary | ICD-10-CM | POA: Insufficient documentation

## 2019-08-29 DIAGNOSIS — Z79899 Other long term (current) drug therapy: Secondary | ICD-10-CM | POA: Diagnosis not present

## 2019-08-29 DIAGNOSIS — I4892 Unspecified atrial flutter: Secondary | ICD-10-CM | POA: Diagnosis not present

## 2019-08-29 DIAGNOSIS — K219 Gastro-esophageal reflux disease without esophagitis: Secondary | ICD-10-CM | POA: Insufficient documentation

## 2019-08-29 DIAGNOSIS — E785 Hyperlipidemia, unspecified: Secondary | ICD-10-CM | POA: Insufficient documentation

## 2019-08-29 DIAGNOSIS — I10 Essential (primary) hypertension: Secondary | ICD-10-CM | POA: Insufficient documentation

## 2019-08-29 DIAGNOSIS — Z6841 Body Mass Index (BMI) 40.0 and over, adult: Secondary | ICD-10-CM | POA: Diagnosis not present

## 2019-08-29 DIAGNOSIS — E669 Obesity, unspecified: Secondary | ICD-10-CM | POA: Diagnosis not present

## 2019-08-29 DIAGNOSIS — Z905 Acquired absence of kidney: Secondary | ICD-10-CM | POA: Insufficient documentation

## 2019-08-29 LAB — BASIC METABOLIC PANEL
Anion gap: 15 (ref 5–15)
BUN: 45 mg/dL — ABNORMAL HIGH (ref 8–23)
CO2: 25 mmol/L (ref 22–32)
Calcium: 9.1 mg/dL (ref 8.9–10.3)
Chloride: 101 mmol/L (ref 98–111)
Creatinine, Ser: 2.79 mg/dL — ABNORMAL HIGH (ref 0.61–1.24)
GFR calc Af Amer: 24 mL/min — ABNORMAL LOW (ref >60)
GFR calc non Af Amer: 21 mL/min — ABNORMAL LOW (ref >60)
Glucose, Bld: 138 mg/dL — ABNORMAL HIGH (ref 70–99)
Potassium: 3.8 mmol/L (ref 3.5–5.1)
Sodium: 141 mmol/L (ref 135–145)

## 2019-08-29 LAB — CBC
HCT: 44.2 % (ref 39.0–52.0)
Hemoglobin: 13.2 g/dL (ref 13.0–17.0)
MCH: 29.3 pg (ref 26.0–34.0)
MCHC: 29.9 g/dL — ABNORMAL LOW (ref 30.0–36.0)
MCV: 98 fL (ref 80.0–100.0)
Platelets: 348 10*3/uL (ref 150–400)
RBC: 4.51 MIL/uL (ref 4.22–5.81)
RDW: 13.6 % (ref 11.5–15.5)
WBC: 8.5 10*3/uL (ref 4.0–10.5)
nRBC: 0 % (ref 0.0–0.2)

## 2019-08-29 NOTE — Telephone Encounter (Signed)
Spoke to [employer] given.

## 2019-08-29 NOTE — Telephone Encounter (Signed)
Noted; continue present meds and follow BP Kirk Ruths

## 2019-08-29 NOTE — Progress Notes (Signed)
Primary Care Physician: Silverio Decamp, MD Primary Cardiologist: Dr Stanford Breed Primary Electrophysiologist: none Referring Physician: Dr Prescott Gum   Clinton Ortiz is a 79 y.o. male with a history of DM, HTN, CAD s/p CABG 07/12/19, HLD, kidney cancer s/p nephrectomy, COPD, and persistent atrial fibrillation who presents for consultation in the [facility].  The patient was initially diagnosed with atrial fibrillation during his hospitalization after CABG. He was started on amiodarone and converted to SR prior to discharge. He was not started on anticoagulation at that time. Patient was seen at [facility] for a syncopal event and was found to be back in afib. He was started on Eliquis for a CHADS2VASC score of 5. Patient was seen by Dr Prescott Gum in follow up and remained in afib. Patient reports he has generalized weakness since being in afib. He denies any missed does of anticoagulation.   Today, he denies symptoms of palpitations, chest pain, shortness of breath, orthopnea, PND, lower extremity edema, dizziness, presyncope, syncope, snoring, daytime somnolence, bleeding, or neurologic sequela. The patient is tolerating medications without difficulties and is otherwise without complaint today.    Atrial Fibrillation Risk Factors:  he does not have symptoms or diagnosis of sleep apnea. he does not have a history of rheumatic fever.   he has a BMI of Body mass index is 43.89 kg/m.Marland Kitchen Filed Weights   08/29/19 1351  Weight: 127.1 kg    Family History  Problem Relation Age of Onset  . Cancer Mother        bladder  . Heart attack Father      Atrial Fibrillation Management history:  Previous antiarrhythmic drugs: amiodarone Previous cardioversions: none Previous ablations: none CHADS2VASC score: 5 Anticoagulation history: Eliquis   Past Medical History:  Diagnosis Date  . Arthritis   . CAD (coronary artery disease)    07/01/2019: LHC with LM disease.  Needs CABG.  . Cancer [facility]) 2013   left kidney  . Complication of anesthesia    slow to wake up after kidney stone surgery  . Constipation, chronic   . COPD (chronic obstructive pulmonary disease) ([place])   . Diabetes mellitus ([place])   . Dyspnea    with exertion  . Family history of adverse reaction to anesthesia    daughter couldn't talk for 3-4 days after anesthesia   . Frequent urination   . GERD (gastroesophageal reflux disease)   . Headache   . Hernia   . History of kidney stones   . Hyperlipidemia   . Hypertension   . Peripheral vascular disease ([place])    right leg blood clot 20 plus years ago  . Pneumonia   . Renal insufficiency    stage 2 (only 1 kidney) had Kidney cancer  . Spondylolysis   . Tongue cancer ([place]) 2011   Past Surgical History:  Procedure Laterality Date  . CENTRAL VENOUS CATHETER INSERTION Right 07/12/2019   Procedure: Insertion Central Line Adult;  Surgeon: Ivin Poot, MD;  Location: [place];  Service: Open Heart Surgery;  Laterality: Right;  Right subclavian   . COLONOSCOPY    . CORONARY ARTERY BYPASS GRAFT N/A 07/12/2019   Procedure: CORONARY ARTERY BYPASS GRAFTING (CABG), ON PUMP, TIMES FOUR , USING LEFT INTERNAL MAMMARY ARTERY AND ENDOSCOPICALLY HARVESTED BILATERAL GREATER SAPHENOUS VEINS;  Surgeon: Ivin Poot, MD;  Location: [place];  Service: Open Heart Surgery;  Laterality: N/A;  . HERNIA REPAIR     abdominal  . KIDNEY STONE SURGERY    .  LEFT HEART CATH AND CORONARY ANGIOGRAPHY N/A 07/01/2019   Procedure: LEFT HEART CATH AND CORONARY ANGIOGRAPHY;  Surgeon: Belva Crome, MD;  Location: [place] CV LAB;  Service: Cardiovascular;  Laterality: N/A;  . NEPHRECTOMY Left 10/08/2011   s/p robotic assisted lap left radical nephrectomy 10/08/11  . PARTIAL GLOSSECTOMY  12/07/2009   s/p partial glossectomy for SCC right tongue 12/07/09  . TEE WITHOUT CARDIOVERSION N/A 07/12/2019   Procedure: TRANSESOPHAGEAL ECHOCARDIOGRAM (TEE);  Surgeon: Prescott Gum,  Collier Salina, MD;  Location: [place];  Service: Open Heart Surgery;  Laterality: N/A;    Current Outpatient Medications  Medication Sig Dispense Refill  . acetaminophen (TYLENOL) 325 MG tablet Take 650 mg by mouth every 6 (six) hours as needed for mild pain or headache.     . Albuterol Sulfate (PROAIR RESPICLICK) 008 (90 BASE) MCG/ACT AEPB Inhale 2 puffs into the lungs every 4 (four) hours as needed. (Patient taking differently: Inhale 2 puffs into the lungs every 4 (four) hours as needed (shortness of breath). ) 1 each 0  . amiodarone (PACERONE) 200 MG tablet Take 1 tablet (200 mg total) by mouth 2 (two) times daily. 60 tablet 1  . apixaban (ELIQUIS) 5 MG TABS tablet Take 5 mg by mouth daily.    Marland Kitchen aspirin EC 81 MG EC tablet Take 1 tablet (81 mg total) by mouth daily.    Marland Kitchen azelastine (ASTELIN) 0.1 % nasal spray SPRAY 2 SPRAYS INTO EACH NOSTRIL TWICE A DAY AS DIRECTED (Patient taking differently: Place 2 sprays into both nostrils daily. ) 30 mL 1  . B Complex Vitamins (B COMPLEX PO) Take 1 tablet by mouth daily.     . Cholecalciferol (VITAMIN D3) 10 MCG (400 UNIT) CHEW Chew by mouth.    . Continuous Blood Gluc Receiver ([place]) DEVI 1 each by Does not apply route daily. 1 each 0  . Continuous Blood Gluc Sensor (DEXCOM G6 SENSOR) MISC 1 each by Does not apply route daily. 3 each 0  . Continuous Blood Gluc Transmit (DEXCOM G6 TRANSMITTER) MISC 1 each by Does not apply route daily. 1 each 0  . Dulaglutide (TRULICITY) 4.5 QP/6.1PJ SOPN Inject 1 pen into the skin once a week. 4 pen 0  . furosemide (LASIX) 40 MG tablet Take 1 tablet (40 mg total) by mouth daily. 90 tablet 3  . gabapentin (NEURONTIN) 300 MG capsule Take 300 mg by mouth 2 (two) times daily.    . magnesium hydroxide (MILK OF MAGNESIA) 400 MG/5ML suspension Take by mouth.    . metoCLOPramide (REGLAN) 10 MG tablet Take 10 mg by mouth as needed.    . metoprolol tartrate (LOPRESSOR) 25 MG tablet Take 0.5 tablets (12.5 mg total) by mouth 2  (two) times daily. 30 tablet 1  . Multiple Vitamins-Minerals (MULTIVITAMIN ADULTS 50+ PO) Take 1 tablet by mouth daily. Chew    . nitroGLYCERIN (NITROSTAT) 0.4 MG SL tablet PLEASE SEE ATTACHED FOR DETAILED DIRECTIONS    . ondansetron (ZOFRAN-ODT) 8 MG disintegrating tablet Take 1 tablet (8 mg total) by mouth every 8 (eight) hours as needed for nausea. 20 tablet 11  . pantoprazole (PROTONIX) 40 MG tablet Take 1 tablet (40 mg total) by mouth daily. 90 tablet 3  . polyethylene glycol powder (GLYCOLAX/MIRALAX) 17 GM/SCOOP powder Take by mouth.    . pravastatin (PRAVACHOL) 80 MG tablet Take 1 tablet (80 mg total) by mouth daily. 90 tablet 3  . temazepam (RESTORIL) 7.5 MG capsule Take 1 capsule (7.5 mg  total) by mouth at bedtime as needed for sleep. 30 capsule 3  . TRELEGY ELLIPTA 100-62.5-25 MCG/INH AEPB INHALE 1 PUFF BY MOUTH EVERY DAY (Patient taking differently: Inhale 1 puff into the lungs daily. ) 60 each 11  . triamcinolone (NASACORT) 55 MCG/ACT AERO nasal inhaler PLACE 2 SPRAYS INTO THE NOSE DAILY. 1 Inhaler 11   No current facility-administered medications for this encounter.    Allergies  Allergen Reactions  . Flavoring Agent Other (See Comments)    makes him feel funny    Social History   Socioeconomic History  . Marital status: Married    Spouse name: Not on file  . Number of children: 2  . Years of education: Not on file  . Highest education level: Not on file  Occupational History  . Not on file  Tobacco Use  . Smoking status: Former Smoker    Packs/day: 1.00    Years: 2.00    Pack years: 2.00    Types: Cigarettes  . Smokeless tobacco: Never Used  . Tobacco comment: smoked 3 years as a teenager  Substance and Sexual Activity  . Alcohol use: No  . Drug use: No  . Sexual activity: Never  Other Topics Concern  . Not on file  Social History Narrative  . Not on file   Social Determinants of Health   Financial Resource Strain:   . Difficulty of Paying Living  Expenses:   Food Insecurity:   . Worried About Charity fundraiser in the Last Year:   . Arboriculturist in the Last Year:   Transportation Needs:   . Film/video editor (Medical):   Marland Kitchen Lack of Transportation (Non-Medical):   Physical Activity:   . Days of Exercise per Week:   . Minutes of Exercise per Session:   Stress:   . Feeling of Stress :   Social Connections:   . Frequency of Communication with Friends and Family:   . Frequency of Social Gatherings with Friends and Family:   . Attends Religious Services:   . Active Member of Clubs or Organizations:   . Attends Archivist Meetings:   Marland Kitchen Marital Status:   Intimate Partner Violence:   . Fear of Current or Ex-Partner:   . Emotionally Abused:   Marland Kitchen Physically Abused:   . Sexually Abused:      ROS- All systems are reviewed and negative except as per the HPI above.  Physical Exam: Vitals:   08/29/19 1351  BP: (!) 144/90  Pulse: (!) 116  Weight: 127.1 kg  Height: 5\' 7"  (1.702 m)    GEN- The patient is well appearing obese elderly male, alert and oriented x 3 today.   Head- normocephalic, atraumatic Eyes-  Sclera clear, conjunctiva pink Ears- hearing intact Oropharynx- clear Neck- supple  Lungs- Clear to ausculation bilaterally, normal work of breathing Heart- irregular rate and rhythm, tachycardia, no murmurs, rubs or gallops  GI- soft, NT, ND, + BS Extremities- no clubbing, cyanosis, or edema MS- no significant deformity or atrophy Skin- no rash or lesion Psych- euthymic mood, full affect Neuro- strength and sensation are intact  Wt Readings from Last 3 Encounters:  08/29/19 127.1 kg  08/24/19 111.1 kg  08/19/19 112 kg    EKG today demonstrates atrial flutter vs coarse afib HR 116, inc RBBB, QRS 112, QTc 517  Echo 05/31/19 demonstrated  1. Left ventricular ejection fraction, by visual estimation, is 60 to  65%. The left ventricle has normal function.  There is no left ventricular  hypertrophy.    2. Left ventricular diastolic parameters are consistent with Grade I  diastolic dysfunction (impaired relaxation).  3. The left ventricle has no regional wall motion abnormalities.  4. Global right ventricle has normal systolic function.The right  ventricular size is normal. No increase in right ventricular wall  thickness.  5. Left atrial size was normal.  6. Right atrial size was normal.  7. The mitral valve is grossly normal. No evidence of mitral valve  regurgitation.  8. The tricuspid valve is not well visualized.  9. The tricuspid valve is not well visualized. Tricuspid valve  regurgitation is not demonstrated.  10. The aortic valve is grossly normal. Aortic valve regurgitation is not  visualized. No evidence of aortic valve sclerosis or stenosis.  11. The pulmonic valve was normal in structure. Pulmonic valve  regurgitation is not visualized.  12. Aortic dilatation noted.  13. There is moderate dilatation of the ascending aorta measuring 41 mm.  14. The atrial septum is grossly normal.  Epic records are reviewed at length today  CHA2DS2-VASc Score = 5 The patient's score is based upon: CHF History: No HTN History: Yes Age : 20 + Diabetes History: Yes Stroke History: No Vascular Disease History: Yes Gender: Male      ASSESSMENT AND PLAN: 1. Persistent Atrial Fibrillation/atrial flutter The patient's CHA2DS2-VASc score is 5, indicating a 7.2% annual risk of stroke.   Patient in atrial flutter today with rapid rates.  Will arrange for DCCV. Patient has been on uninterrupted anticoagulation for 3 weeks (started 08/08/19). Continue Eliquis 5 mg BID Continue amiodarone 200 mg BID Increase Lopressor to 25 mg BID until DCCV, then decrease back to 12.5 mg BID. Check bmet/CBC  2. Secondary Hypercoagulable State (ICD10:  D68.69) The patient is at significant risk for stroke/thromboembolism based upon his CHA2DS2-VASc Score of 5.  Continue Apixaban (Eliquis).   3.  Obesity Body mass index is 43.89 kg/m. Lifestyle modification was discussed at length including regular exercise and weight reduction.  4. CAD S/p CABG 07/12/19 No anginal symptoms.  5. HTN Stable, no changes today.   Follow up in the AF clinic one week post DCCV.   [facility] [address] [place], [place] [address] [phone] 08/29/2019 2:36 PM

## 2019-08-29 NOTE — Telephone Encounter (Signed)
Spoke to [employer] with [facility] [place].She stated patient took all morning medications twice this past Sat morning by mistake.Stated she checked patient this morning B/P 92/60 pulse 90 irreg.He has lost 10 lbs since last Thursday.Patient was ok felt weak and tired.Advised I will send message to [facility] for review.

## 2019-08-29 NOTE — Telephone Encounter (Signed)
Clinton Ortiz is calling stating [place] double took his morning medication on Saturday 08/27/19. She states he is currently stable, but wanted to make Dr. Stanford Breed aware of this. Please advise.

## 2019-08-29 NOTE — Patient Instructions (Addendum)
  Increase Lopressor dose to 25mg  twice daily until Cardioversion Decrease Lopressor morning of Cardioversion to 1/2 tablet twice daily.  Cardioversion scheduled for 09/02/19 at [place] at the [facility] and go to admitting at [place] not eat or drink anything after midnight the night prior to your procedure.  - Take all your morning medications with a sip of water prior to arrival and to wait and take diabetes medication once he is home.  - You will not be able to drive home after your procedure.

## 2019-08-29 NOTE — H&P (View-Only) (Signed)
Primary Care Physician: Silverio Decamp, MD Primary Cardiologist: Dr Stanford Breed Primary Electrophysiologist: none Referring Physician: Dr Prescott Gum   Clinton Ortiz is a 79 y.o. male with a history of DM, HTN, CAD s/p CABG 07/12/19, HLD, kidney cancer s/p nephrectomy, COPD, and persistent atrial fibrillation who presents for consultation in the [facility].  The patient was initially diagnosed with atrial fibrillation during his hospitalization after CABG. He was started on amiodarone and converted to SR prior to discharge. He was not started on anticoagulation at that time. Patient was seen at [facility] for a syncopal event and was found to be back in afib. He was started on Eliquis for a CHADS2VASC score of 5. Patient was seen by Dr Prescott Gum in follow up and remained in afib. Patient reports he has generalized weakness since being in afib. He denies any missed does of anticoagulation.   Today, he denies symptoms of palpitations, chest pain, shortness of breath, orthopnea, PND, lower extremity edema, dizziness, presyncope, syncope, snoring, daytime somnolence, bleeding, or neurologic sequela. The patient is tolerating medications without difficulties and is otherwise without complaint today.    Atrial Fibrillation Risk Factors:  he does not have symptoms or diagnosis of sleep apnea. he does not have a history of rheumatic fever.   he has a BMI of Body mass index is 43.89 kg/m.Marland Kitchen Filed Weights   08/29/19 1351  Weight: 127.1 kg    Family History  Problem Relation Age of Onset  . Cancer Mother        bladder  . Heart attack Father      Atrial Fibrillation Management history:  Previous antiarrhythmic drugs: amiodarone Previous cardioversions: none Previous ablations: none CHADS2VASC score: 5 Anticoagulation history: Eliquis   Past Medical History:  Diagnosis Date  . Arthritis   . CAD (coronary artery disease)    07/01/2019: LHC with LM disease.  Needs CABG.  . Cancer [facility]) 2013   left kidney  . Complication of anesthesia    slow to wake up after kidney stone surgery  . Constipation, chronic   . COPD (chronic obstructive pulmonary disease) ([place])   . Diabetes mellitus ([place])   . Dyspnea    with exertion  . Family history of adverse reaction to anesthesia    daughter couldn't talk for 3-4 days after anesthesia   . Frequent urination   . GERD (gastroesophageal reflux disease)   . Headache   . Hernia   . History of kidney stones   . Hyperlipidemia   . Hypertension   . Peripheral vascular disease ([place])    right leg blood clot 20 plus years ago  . Pneumonia   . Renal insufficiency    stage 2 (only 1 kidney) had Kidney cancer  . Spondylolysis   . Tongue cancer ([place]) 2011   Past Surgical History:  Procedure Laterality Date  . CENTRAL VENOUS CATHETER INSERTION Right 07/12/2019   Procedure: Insertion Central Line Adult;  Surgeon: Ivin Poot, MD;  Location: [place];  Service: Open Heart Surgery;  Laterality: Right;  Right subclavian   . COLONOSCOPY    . CORONARY ARTERY BYPASS GRAFT N/A 07/12/2019   Procedure: CORONARY ARTERY BYPASS GRAFTING (CABG), ON PUMP, TIMES FOUR , USING LEFT INTERNAL MAMMARY ARTERY AND ENDOSCOPICALLY HARVESTED BILATERAL GREATER SAPHENOUS VEINS;  Surgeon: Ivin Poot, MD;  Location: [facility];  Service: Open Heart Surgery;  Laterality: N/A;  . HERNIA REPAIR     abdominal  . KIDNEY STONE SURGERY    .  LEFT HEART CATH AND CORONARY ANGIOGRAPHY N/A 07/01/2019   Procedure: LEFT HEART CATH AND CORONARY ANGIOGRAPHY;  Surgeon: Belva Crome, MD;  Location: [facility];  Service: Cardiovascular;  Laterality: N/A;  . NEPHRECTOMY Left 10/08/2011   s/p robotic assisted lap left radical nephrectomy 10/08/11  . PARTIAL GLOSSECTOMY  12/07/2009   s/p partial glossectomy for SCC right tongue 12/07/09  . TEE WITHOUT CARDIOVERSION N/A 07/12/2019   Procedure: TRANSESOPHAGEAL ECHOCARDIOGRAM (TEE);  Surgeon: Prescott Gum,  Collier Salina, MD;  Location: [facility];  Service: Open Heart Surgery;  Laterality: N/A;    Current Outpatient Medications  Medication Sig Dispense Refill  . acetaminophen (TYLENOL) 325 MG tablet Take 650 mg by mouth every 6 (six) hours as needed for mild pain or headache.     . Albuterol Sulfate (PROAIR RESPICLICK) 865 (90 BASE) MCG/ACT AEPB Inhale 2 puffs into the lungs every 4 (four) hours as needed. (Patient taking differently: Inhale 2 puffs into the lungs every 4 (four) hours as needed (shortness of breath). ) 1 each 0  . amiodarone (PACERONE) 200 MG tablet Take 1 tablet (200 mg total) by mouth 2 (two) times daily. 60 tablet 1  . apixaban (ELIQUIS) 5 MG TABS tablet Take 5 mg by mouth daily.    Marland Kitchen aspirin EC 81 MG EC tablet Take 1 tablet (81 mg total) by mouth daily.    Marland Kitchen azelastine (ASTELIN) 0.1 % nasal spray SPRAY 2 SPRAYS INTO EACH NOSTRIL TWICE A DAY AS DIRECTED (Patient taking differently: Place 2 sprays into both nostrils daily. ) 30 mL 1  . B Complex Vitamins (B COMPLEX PO) Take 1 tablet by mouth daily.     . Cholecalciferol (VITAMIN D3) 10 MCG (400 UNIT) CHEW Chew by mouth.    . Continuous Blood Gluc Receiver ([place]) DEVI 1 each by Does not apply route daily. 1 each 0  . Continuous Blood Gluc Sensor (DEXCOM G6 SENSOR) MISC 1 each by Does not apply route daily. 3 each 0  . Continuous Blood Gluc Transmit (DEXCOM G6 TRANSMITTER) MISC 1 each by Does not apply route daily. 1 each 0  . Dulaglutide (TRULICITY) 4.5 HQ/4.6NG SOPN Inject 1 pen into the skin once a week. 4 pen 0  . furosemide (LASIX) 40 MG tablet Take 1 tablet (40 mg total) by mouth daily. 90 tablet 3  . gabapentin (NEURONTIN) 300 MG capsule Take 300 mg by mouth 2 (two) times daily.    . magnesium hydroxide (MILK OF MAGNESIA) 400 MG/5ML suspension Take by mouth.    . metoCLOPramide (REGLAN) 10 MG tablet Take 10 mg by mouth as needed.    . metoprolol tartrate (LOPRESSOR) 25 MG tablet Take 0.5 tablets (12.5 mg total) by mouth 2  (two) times daily. 30 tablet 1  . Multiple Vitamins-Minerals (MULTIVITAMIN ADULTS 50+ PO) Take 1 tablet by mouth daily. Chew    . nitroGLYCERIN (NITROSTAT) 0.4 MG SL tablet PLEASE SEE ATTACHED FOR DETAILED DIRECTIONS    . ondansetron (ZOFRAN-ODT) 8 MG disintegrating tablet Take 1 tablet (8 mg total) by mouth every 8 (eight) hours as needed for nausea. 20 tablet 11  . pantoprazole (PROTONIX) 40 MG tablet Take 1 tablet (40 mg total) by mouth daily. 90 tablet 3  . polyethylene glycol powder (GLYCOLAX/MIRALAX) 17 GM/SCOOP powder Take by mouth.    . pravastatin (PRAVACHOL) 80 MG tablet Take 1 tablet (80 mg total) by mouth daily. 90 tablet 3  . temazepam (RESTORIL) 7.5 MG capsule Take 1 capsule (7.5 mg  total) by mouth at bedtime as needed for sleep. 30 capsule 3  . TRELEGY ELLIPTA 100-62.5-25 MCG/INH AEPB INHALE 1 PUFF BY MOUTH EVERY DAY (Patient taking differently: Inhale 1 puff into the lungs daily. ) 60 each 11  . triamcinolone (NASACORT) 55 MCG/ACT AERO nasal inhaler PLACE 2 SPRAYS INTO THE NOSE DAILY. 1 Inhaler 11   No current facility-administered medications for this encounter.    Allergies  Allergen Reactions  . Flavoring Agent Other (See Comments)    makes him feel funny    Social History   Socioeconomic History  . Marital status: Married    Spouse name: Not on file  . Number of children: 2  . Years of education: Not on file  . Highest education level: Not on file  Occupational History  . Not on file  Tobacco Use  . Smoking status: Former Smoker    Packs/day: 1.00    Years: 2.00    Pack years: 2.00    Types: Cigarettes  . Smokeless tobacco: Never Used  . Tobacco comment: smoked 3 years as a teenager  Substance and Sexual Activity  . Alcohol use: No  . Drug use: No  . Sexual activity: Never  Other Topics Concern  . Not on file  Social History Narrative  . Not on file   Social Determinants of Health   Financial Resource Strain:   . Difficulty of Paying Living  Expenses:   Food Insecurity:   . Worried About Charity fundraiser in the Last Year:   . Arboriculturist in the Last Year:   Transportation Needs:   . Film/video editor (Medical):   Marland Kitchen Lack of Transportation (Non-Medical):   Physical Activity:   . Days of Exercise per Week:   . Minutes of Exercise per Session:   Stress:   . Feeling of Stress :   Social Connections:   . Frequency of Communication with Friends and Family:   . Frequency of Social Gatherings with Friends and Family:   . Attends Religious Services:   . Active Member of Clubs or Organizations:   . Attends Archivist Meetings:   Marland Kitchen Marital Status:   Intimate Partner Violence:   . Fear of Current or Ex-Partner:   . Emotionally Abused:   Marland Kitchen Physically Abused:   . Sexually Abused:      ROS- All systems are reviewed and negative except as per the HPI above.  Physical Exam: Vitals:   08/29/19 1351  BP: (!) 144/90  Pulse: (!) 116  Weight: 127.1 kg  Height: 5\' 7"  (1.702 m)    GEN- The patient is well appearing obese elderly male, alert and oriented x 3 today.   Head- normocephalic, atraumatic Eyes-  Sclera clear, conjunctiva pink Ears- hearing intact Oropharynx- clear Neck- supple  Lungs- Clear to ausculation bilaterally, normal work of breathing Heart- irregular rate and rhythm, tachycardia, no murmurs, rubs or gallops  GI- soft, NT, ND, + BS Extremities- no clubbing, cyanosis, or edema MS- no significant deformity or atrophy Skin- no rash or lesion Psych- euthymic mood, full affect Neuro- strength and sensation are intact  Wt Readings from Last 3 Encounters:  08/29/19 127.1 kg  08/24/19 111.1 kg  08/19/19 112 kg    EKG today demonstrates atrial flutter vs coarse afib HR 116, inc RBBB, QRS 112, QTc 517  Echo 05/31/19 demonstrated  1. Left ventricular ejection fraction, by visual estimation, is 60 to  65%. The left ventricle has normal function.  There is no left ventricular  hypertrophy.    2. Left ventricular diastolic parameters are consistent with Grade I  diastolic dysfunction (impaired relaxation).  3. The left ventricle has no regional wall motion abnormalities.  4. Global right ventricle has normal systolic function.The right  ventricular size is normal. No increase in right ventricular wall  thickness.  5. Left atrial size was normal.  6. Right atrial size was normal.  7. The mitral valve is grossly normal. No evidence of mitral valve  regurgitation.  8. The tricuspid valve is not well visualized.  9. The tricuspid valve is not well visualized. Tricuspid valve  regurgitation is not demonstrated.  10. The aortic valve is grossly normal. Aortic valve regurgitation is not  visualized. No evidence of aortic valve sclerosis or stenosis.  11. The pulmonic valve was normal in structure. Pulmonic valve  regurgitation is not visualized.  12. Aortic dilatation noted.  13. There is moderate dilatation of the ascending aorta measuring 41 mm.  14. The atrial septum is grossly normal.  Epic records are reviewed at length today  CHA2DS2-VASc Score = 5 The patient's score is based upon: CHF History: No HTN History: Yes Age : 39 + Diabetes History: Yes Stroke History: No Vascular Disease History: Yes Gender: Male      ASSESSMENT AND PLAN: 1. Persistent Atrial Fibrillation/atrial flutter The patient's CHA2DS2-VASc score is 5, indicating a 7.2% annual risk of stroke.   Patient in atrial flutter today with rapid rates.  Will arrange for DCCV. Patient has been on uninterrupted anticoagulation for 3 weeks (started 08/08/19). Continue Eliquis 5 mg BID Continue amiodarone 200 mg BID Increase Lopressor to 25 mg BID until DCCV, then decrease back to 12.5 mg BID. Check bmet/CBC  2. Secondary Hypercoagulable State (ICD10:  D68.69) The patient is at significant risk for stroke/thromboembolism based upon his CHA2DS2-VASc Score of 5.  Continue Apixaban (Eliquis).   3.  Obesity Body mass index is 43.89 kg/m. Lifestyle modification was discussed at length including regular exercise and weight reduction.  4. CAD S/p CABG 07/12/19 No anginal symptoms.  5. HTN Stable, no changes today.   Follow up in the AF clinic one week post DCCV.   [facility] [address] [place], [place] [address] [phone] 08/29/2019 2:36 PM

## 2019-08-30 ENCOUNTER — Other Ambulatory Visit (HOSPITAL_COMMUNITY)
Admission: RE | Admit: 2019-08-30 | Discharge: 2019-08-30 | Disposition: A | Discharge status: Home / Self Care | Payer: Medicare Other | Source: Ambulatory Visit | Attending: Cardiology | Admitting: Cardiology

## 2019-08-30 DIAGNOSIS — Z01812 Encounter for preprocedural laboratory examination: Secondary | ICD-10-CM | POA: Diagnosis not present

## 2019-08-30 DIAGNOSIS — Z20822 Contact with and (suspected) exposure to covid-19: Secondary | ICD-10-CM | POA: Diagnosis not present

## 2019-08-31 DIAGNOSIS — N201 Calculus of ureter: Secondary | ICD-10-CM | POA: Diagnosis not present

## 2019-08-31 LAB — SARS CORONAVIRUS 2 (TAT 6-24 HRS): SARS Coronavirus 2: NEGATIVE

## 2019-09-02 ENCOUNTER — Encounter (HOSPITAL_COMMUNITY): Payer: Self-pay | Admitting: Cardiology

## 2019-09-02 ENCOUNTER — Encounter (HOSPITAL_COMMUNITY)
Admission: RE | Disposition: A | Discharge status: Home / Self Care | Payer: Self-pay | Source: Home / Self Care | Attending: Cardiology

## 2019-09-02 ENCOUNTER — Ambulatory Visit (INDEPENDENT_AMBULATORY_CARE_PROVIDER_SITE_OTHER): Payer: Medicare Other

## 2019-09-02 ENCOUNTER — Other Ambulatory Visit: Payer: Self-pay

## 2019-09-02 ENCOUNTER — Ambulatory Visit (HOSPITAL_COMMUNITY)
Admission: RE | Admit: 2019-09-02 | Discharge: 2019-09-02 | Disposition: A | Discharge status: Home / Self Care | Payer: Medicare Other | Attending: Cardiology | Admitting: Cardiology

## 2019-09-02 ENCOUNTER — Ambulatory Visit (HOSPITAL_COMMUNITY): Payer: Medicare Other | Admitting: Certified Registered Nurse Anesthetist

## 2019-09-02 ENCOUNTER — Encounter: Payer: Self-pay | Admitting: Sports Medicine

## 2019-09-02 ENCOUNTER — Ambulatory Visit (INDEPENDENT_AMBULATORY_CARE_PROVIDER_SITE_OTHER): Payer: Medicare Other | Admitting: Sports Medicine

## 2019-09-02 DIAGNOSIS — D6869 Other thrombophilia: Secondary | ICD-10-CM | POA: Insufficient documentation

## 2019-09-02 DIAGNOSIS — I10 Essential (primary) hypertension: Secondary | ICD-10-CM | POA: Diagnosis not present

## 2019-09-02 DIAGNOSIS — E1122 Type 2 diabetes mellitus with diabetic chronic kidney disease: Secondary | ICD-10-CM | POA: Insufficient documentation

## 2019-09-02 DIAGNOSIS — E785 Hyperlipidemia, unspecified: Secondary | ICD-10-CM | POA: Insufficient documentation

## 2019-09-02 DIAGNOSIS — Z7982 Long term (current) use of aspirin: Secondary | ICD-10-CM | POA: Insufficient documentation

## 2019-09-02 DIAGNOSIS — J986 Disorders of diaphragm: Secondary | ICD-10-CM | POA: Insufficient documentation

## 2019-09-02 DIAGNOSIS — J449 Chronic obstructive pulmonary disease, unspecified: Secondary | ICD-10-CM | POA: Insufficient documentation

## 2019-09-02 DIAGNOSIS — I129 Hypertensive chronic kidney disease with stage 1 through stage 4 chronic kidney disease, or unspecified chronic kidney disease: Secondary | ICD-10-CM | POA: Insufficient documentation

## 2019-09-02 DIAGNOSIS — I4892 Unspecified atrial flutter: Secondary | ICD-10-CM | POA: Diagnosis not present

## 2019-09-02 DIAGNOSIS — Z7901 Long term (current) use of anticoagulants: Secondary | ICD-10-CM | POA: Diagnosis not present

## 2019-09-02 DIAGNOSIS — I2510 Atherosclerotic heart disease of native coronary artery without angina pectoris: Secondary | ICD-10-CM | POA: Diagnosis not present

## 2019-09-02 DIAGNOSIS — Z951 Presence of aortocoronary bypass graft: Secondary | ICD-10-CM | POA: Insufficient documentation

## 2019-09-02 DIAGNOSIS — Z79899 Other long term (current) drug therapy: Secondary | ICD-10-CM | POA: Insufficient documentation

## 2019-09-02 DIAGNOSIS — Z794 Long term (current) use of insulin: Secondary | ICD-10-CM | POA: Insufficient documentation

## 2019-09-02 DIAGNOSIS — Z87891 Personal history of nicotine dependence: Secondary | ICD-10-CM | POA: Insufficient documentation

## 2019-09-02 DIAGNOSIS — N400 Enlarged prostate without lower urinary tract symptoms: Secondary | ICD-10-CM | POA: Diagnosis not present

## 2019-09-02 DIAGNOSIS — E1121 Type 2 diabetes mellitus with diabetic nephropathy: Secondary | ICD-10-CM

## 2019-09-02 DIAGNOSIS — I4819 Other persistent atrial fibrillation: Secondary | ICD-10-CM | POA: Diagnosis not present

## 2019-09-02 DIAGNOSIS — N182 Chronic kidney disease, stage 2 (mild): Secondary | ICD-10-CM | POA: Diagnosis not present

## 2019-09-02 DIAGNOSIS — J9811 Atelectasis: Secondary | ICD-10-CM | POA: Diagnosis not present

## 2019-09-02 DIAGNOSIS — Z6841 Body Mass Index (BMI) 40.0 and over, adult: Secondary | ICD-10-CM | POA: Diagnosis not present

## 2019-09-02 DIAGNOSIS — M722 Plantar fascial fibromatosis: Secondary | ICD-10-CM | POA: Diagnosis not present

## 2019-09-02 DIAGNOSIS — E669 Obesity, unspecified: Secondary | ICD-10-CM | POA: Insufficient documentation

## 2019-09-02 DIAGNOSIS — J90 Pleural effusion, not elsewhere classified: Secondary | ICD-10-CM | POA: Diagnosis not present

## 2019-09-02 HISTORY — PX: CARDIOVERSION: SHX1299

## 2019-09-02 SURGERY — CARDIOVERSION
Anesthesia: General

## 2019-09-02 MED ORDER — SODIUM CHLORIDE 0.9 % IV SOLN: INTRAVENOUS | Status: DC | PRN

## 2019-09-02 MED ORDER — PROPOFOL 10 MG/ML IV BOLUS
INTRAVENOUS | Status: DC | PRN
  Administered 2019-09-02: 70 mg via INTRAVENOUS

## 2019-09-02 MED ORDER — LIDOCAINE 2% (20 MG/ML) 5 ML SYRINGE
INTRAMUSCULAR | Status: DC | PRN
  Administered 2019-09-02: 30 mg via INTRAVENOUS

## 2019-09-02 MED ORDER — SODIUM CHLORIDE 0.9 % IV SOLN
INTRAVENOUS | Status: AC | PRN
  Administered 2019-09-02: 500 mL via INTRAVENOUS

## 2019-09-02 NOTE — CV Procedure (Signed)
Procedure:   DCCV  Indication:  Symptomatic atrial fibrillation  Procedure Note:  The patient signed informed consent.  They have had had therapeutic anticoagulation with apixaban greater than 3 weeks.  Anesthesia was administered by Dr. Nyoka Cowden. He received 70 mg propofol and 30 mg lidocaine Adequate airway was maintained throughout and vital followed per protocol.  They were cardioverted x 1 with 150J of biphasic synchronized energy.  They converted to NSR.  There were no apparent complications.  The patient had normal neuro status and respiratory status post procedure with vitals stable as recorded elsewhere.    Follow up:  They will continue on current medical therapy and follow up with cardiology as scheduled.  Buford Dresser, MD PhD 09/02/2019 11:14 AM

## 2019-09-02 NOTE — Assessment & Plan Note (Signed)
Suspected elevated left hemidiaphragm on recent chest x-ray with cardiothoracic surgery. I would like another chest x-ray, dedicated expansion film to ensure both hemidiaphragms move symmetrically.

## 2019-09-02 NOTE — Interval H&P Note (Signed)
History and Physical Interval Note:  09/02/2019 10:52 AM  Clinton Ortiz  has presented today for surgery, with the diagnosis of A-FLUTTER.  The various methods of treatment have been discussed with the patient and family. After consideration of risks, benefits and other options for treatment, the patient has consented to  Procedure(s): CARDIOVERSION (N/A) as a surgical intervention.  The patient's history has been reviewed, patient examined, no change in status, stable for surgery.  I have reviewed the patient's chart and labs.  Questions were answered to the patient's satisfaction.     Karlei Waldo Harrell Gave

## 2019-09-02 NOTE — Assessment & Plan Note (Signed)
At the last visit we discontinued his glipizide and restarted his Trulicity, blood sugars look great. He can return to recheck his hemoglobin A1c in about 2-1/2 months.

## 2019-09-02 NOTE — Assessment & Plan Note (Signed)
Clinton Ortiz returns, he is a pleasant 79 year old male, he is post CABG, he had a few episodes of dehydration and ended up with significant orthostasis with systolic blood pressures in the 70s. He was hospitalized, hydrated, and symptoms improved. He was placed on amiodarone for his A. fib as well as metoprolol twice daily. Unfortunately he did have a recurrence of profound orthostasis with syncope. We left his amiodarone alone, and I decreased his metoprolol dose in half. He also had his Flomax discontinued and he reports resolution of his orthostasis.

## 2019-09-02 NOTE — Anesthesia Postprocedure Evaluation (Signed)
Anesthesia Post Note  Patient: Clinton Ortiz  Procedure(s) Performed: CARDIOVERSION (N/A )     Patient location during evaluation: Cath Lab Anesthesia Type: General Level of consciousness: awake Pain management: pain level controlled Vital Signs Assessment: post-procedure vital signs reviewed and stable Respiratory status: spontaneous breathing Cardiovascular status: stable Postop Assessment: no apparent nausea or vomiting Anesthetic complications: no    Last Vitals:  Vitals:   09/02/19 1132 09/02/19 1141  BP: 114/74 134/89  Pulse: 79 80  Resp: 17 18  Temp:    SpO2: 96% 96%    Last Pain:  Vitals:   09/02/19 1141  TempSrc:   PainSc: 0-No pain                 Hermenia Fritcher

## 2019-09-02 NOTE — Transfer of Care (Signed)
Immediate Anesthesia Transfer of Care Note  Patient: [name]  Procedure(s) Performed: CARDIOVERSION (N/A )  Patient Location: PACU and Endoscopy Unit  Anesthesia Type:General  Level of Consciousness: drowsy and patient cooperative  Airway & Oxygen Therapy: Patient Spontanous Breathing and Patient connected to nasal cannula oxygen  Post-op Assessment: Report given to RN and Post -op Vital signs reviewed and stable  Post vital signs: Reviewed and stable  Last Vitals:  Vitals Value Taken Time  BP 97/68 09/02/19 1114  Temp    Pulse 77 09/02/19 1114  Resp 14 09/02/19 1114  SpO2 100 % 09/02/19 1114    Last Pain:  Vitals:   09/02/19 1007  TempSrc: Temporal  PainSc: 0-No pain         Complications: No apparent anesthesia complications

## 2019-09-02 NOTE — Anesthesia Preprocedure Evaluation (Signed)
Anesthesia Evaluation  Patient identified by MRN, date of birth, ID band Patient awake    Reviewed: Allergy & Precautions, NPO status , Patient's Chart, lab work & pertinent test results  Airway Mallampati: II  TM Distance: >3 FB     Dental   Pulmonary shortness of breath, COPD, former smoker,    breath sounds clear to auscultation       Cardiovascular hypertension, + CAD and + Peripheral Vascular Disease   Rhythm:Irregular Rate:Normal     Neuro/Psych  Headaches,    GI/Hepatic GERD  ,  Endo/Other  diabetesHypothyroidism   Renal/GU Renal disease     Musculoskeletal  (+) Arthritis ,   Abdominal   Peds  Hematology   Anesthesia Other Findings   Reproductive/Obstetrics                             Anesthesia Physical Anesthesia Plan  ASA: III  Anesthesia Plan: General   Post-op Pain Management:    Induction: Intravenous  PONV Risk Score and Plan: 2 and Propofol infusion and Treatment may vary due to age or medical condition  Airway Management Planned: Nasal Cannula, Mask and Simple Face Mask  Additional Equipment:   Intra-op Plan:   Post-operative Plan:   Informed Consent:     Dental advisory given  Plan Discussed with: CRNA and Anesthesiologist  Anesthesia Plan Comments:         Anesthesia Quick Evaluation

## 2019-09-02 NOTE — Progress Notes (Signed)
    Procedures performed today:    None.  Independent interpretation of notes and tests performed by another provider:   None.  Brief History, Exam, Impression, and Recommendations:    Acquired elevated hemidiaphragm Suspected elevated left hemidiaphragm on recent chest x-ray with cardiothoracic surgery. I would like another chest x-ray, dedicated expansion film to ensure both hemidiaphragms move symmetrically.  Hypertension Clinton Ortiz returns, he is a pleasant 79 year old male, he is post CABG, he had a few episodes of dehydration and ended up with significant orthostasis with systolic blood pressures in the 70s. He was hospitalized, hydrated, and symptoms improved. He was placed on amiodarone for his A. fib as well as metoprolol twice daily. Unfortunately he did have a recurrence of profound orthostasis with syncope. We left his amiodarone alone, and I decreased his metoprolol dose in half. He also had his Flomax discontinued and he reports resolution of his orthostasis.  Controlled type 2 diabetes mellitus with diabetic nephropathy ([name]) At the last visit we discontinued his glipizide and restarted his Trulicity, blood sugars look great. He can return to recheck his hemoglobin A1c in about 2-1/2 months.    ___________________________________________ Gwen Her. Dianah Field, M.D., ABFM., CAQSM. Primary Care and [place] Instructor of [place] [employer] [facility] [employer]

## 2019-09-02 NOTE — Anesthesia Procedure Notes (Signed)
Procedure Name: General with mask airway Date/Time: 09/02/2019 11:16 AM Performed by: Lowella Dell, CRNA Pre-anesthesia Checklist: Patient identified, Emergency Drugs available, Suction available, Patient being monitored and Timeout performed Patient Re-evaluated:Patient Re-evaluated prior to induction Oxygen Delivery Method: Ambu bag Preoxygenation: Pre-oxygenation with 100% oxygen Induction Type: IV induction Airway Equipment and Method: Oral airway Placement Confirmation: positive ETCO2 Dental Injury: Teeth and Oropharynx as per pre-operative assessment

## 2019-09-05 ENCOUNTER — Encounter: Payer: Self-pay | Admitting: *Deleted

## 2019-09-06 DIAGNOSIS — N201 Calculus of ureter: Secondary | ICD-10-CM | POA: Diagnosis not present

## 2019-09-07 ENCOUNTER — Other Ambulatory Visit: Payer: Self-pay

## 2019-09-07 DIAGNOSIS — E1121 Type 2 diabetes mellitus with diabetic nephropathy: Secondary | ICD-10-CM

## 2019-09-07 MED ORDER — TRULICITY 4.5 MG/0.5ML ~~LOC~~ SOAJ: 4.5000 mg | SUBCUTANEOUS | 2 refills | Status: DC

## 2019-09-07 NOTE — Telephone Encounter (Signed)
Request for a refill on Trulicity. Historical medication.

## 2019-09-08 NOTE — Progress Notes (Signed)
Primary Care Physician: Silverio Decamp, MD Primary Cardiologist: Dr Stanford Breed Primary Electrophysiologist: none Referring Physician: Dr Prescott Gum   Clinton Ortiz is a 79 y.o. male with a history of DM, HTN, CAD s/p CABG 07/12/19, HLD, kidney cancer s/p nephrectomy, COPD, and persistent atrial fibrillation who presents for follow up in the [facility].  The patient was initially diagnosed with atrial fibrillation during his hospitalization after CABG. He was started on amiodarone and converted to SR prior to discharge. He was not started on anticoagulation at that time. Patient was seen at [facility] for a syncopal event and was found to be back in afib. He was started on Eliquis for a CHADS2VASC score of 5. Patient was seen by Dr Prescott Gum in follow up and remained in afib. Patient reports he has generalized weakness since being in afib. He denies any missed does of anticoagulation.   On follow up today, patient is s/p DCCV 09/02/19. He reports that he has done very well since the procedure with more energy and much less SOB. He denies any bleeding issues on anticoagulation.   Today, he denies symptoms of palpitations, chest pain, shortness of breath, orthopnea, PND, lower extremity edema, dizziness, presyncope, syncope, snoring, daytime somnolence, bleeding, or neurologic sequela. The patient is tolerating medications without difficulties and is otherwise without complaint today.    Atrial Fibrillation Risk Factors:  he does not have symptoms or diagnosis of sleep apnea. he does not have a history of rheumatic fever.   he has a BMI of Body mass index is 37.68 kg/m.Marland Kitchen Filed Weights   09/09/19 0948  Weight: 109.1 kg    Family History  Problem Relation Age of Onset  . Cancer Mother        bladder  . Heart attack Father      Atrial Fibrillation Management history:  Previous antiarrhythmic drugs: amiodarone Previous cardioversions: 09/02/19 Previous  ablations: none CHADS2VASC score: 5 Anticoagulation history: Eliquis   Past Medical History:  Diagnosis Date  . Arthritis   . CAD (coronary artery disease)    07/01/2019: LHC with LM disease. Needs CABG.  . Cancer [facility]) 2013   left kidney  . Complication of anesthesia    slow to wake up after kidney stone surgery  . Constipation, chronic   . COPD (chronic obstructive pulmonary disease) ([place])   . Diabetes mellitus ([place])   . Dyspnea    with exertion  . Family history of adverse reaction to anesthesia    daughter couldn't talk for 3-4 days after anesthesia   . Frequent urination   . GERD (gastroesophageal reflux disease)   . Headache   . Hernia   . History of kidney stones   . Hyperlipidemia   . Hypertension   . Peripheral vascular disease ([place])    right leg blood clot 20 plus years ago  . Pneumonia   . Renal insufficiency    stage 2 (only 1 kidney) had Kidney cancer  . Spondylolysis   . Tongue cancer ([place]) 2011   Past Surgical History:  Procedure Laterality Date  . CARDIOVERSION N/A 09/02/2019   Procedure: CARDIOVERSION;  Surgeon: Buford Dresser, MD;  Location: [place];  Service: Cardiovascular;  Laterality: N/A;  . CENTRAL VENOUS CATHETER INSERTION Right 07/12/2019   Procedure: Insertion Central Line Adult;  Surgeon: Ivin Poot, MD;  Location: [place];  Service: Open Heart Surgery;  Laterality: Right;  Right subclavian   . COLONOSCOPY    . CORONARY ARTERY  BYPASS GRAFT N/A 07/12/2019   Procedure: CORONARY ARTERY BYPASS GRAFTING (CABG), ON PUMP, TIMES FOUR , USING LEFT INTERNAL MAMMARY ARTERY AND ENDOSCOPICALLY HARVESTED BILATERAL GREATER SAPHENOUS VEINS;  Surgeon: Ivin Poot, MD;  Location: [place];  Service: Open Heart Surgery;  Laterality: N/A;  . HERNIA REPAIR     abdominal  . KIDNEY STONE SURGERY    . LEFT HEART CATH AND CORONARY ANGIOGRAPHY N/A 07/01/2019   Procedure: LEFT HEART CATH AND CORONARY ANGIOGRAPHY;  Surgeon: Belva Crome, MD;  Location:  [place] [facility];  Service: Cardiovascular;  Laterality: N/A;  . NEPHRECTOMY Left 10/08/2011   s/p robotic assisted lap left radical nephrectomy 10/08/11  . PARTIAL GLOSSECTOMY  12/07/2009   s/p partial glossectomy for SCC right tongue 12/07/09  . TEE WITHOUT CARDIOVERSION N/A 07/12/2019   Procedure: TRANSESOPHAGEAL ECHOCARDIOGRAM (TEE);  Surgeon: Prescott Gum, Collier Salina, MD;  Location: [place];  Service: Open Heart Surgery;  Laterality: N/A;    Current Outpatient Medications  Medication Sig Dispense Refill  . acetaminophen (TYLENOL) 325 MG tablet Take 650 mg by mouth every 6 (six) hours as needed for mild pain or headache.     . Albuterol Sulfate (PROAIR RESPICLICK) 856 (90 BASE) MCG/ACT AEPB Inhale 2 puffs into the lungs every 4 (four) hours as needed. (Patient taking differently: Inhale 2 puffs into the lungs every 4 (four) hours as needed (shortness of breath). ) 1 each 0  . amiodarone (PACERONE) 200 MG tablet Take 1 tablet (200 mg total) by mouth daily. 30 tablet 3  . apixaban (ELIQUIS) 5 MG TABS tablet Take 5 mg by mouth 2 (two) times daily.     Marland Kitchen aspirin EC 81 MG EC tablet Take 1 tablet (81 mg total) by mouth daily.    Marland Kitchen azelastine (ASTELIN) 0.1 % nasal spray SPRAY 2 SPRAYS INTO EACH NOSTRIL TWICE A DAY AS DIRECTED (Patient taking differently: Place 2 sprays into both nostrils daily as needed for rhinitis or allergies. ) 30 mL 1  . B Complex Vitamins (B COMPLEX PO) Take 1 tablet by mouth daily.     . Cholecalciferol (VITAMIN D3) 10 MCG (400 UNIT) CHEW Chew 1 tablet by mouth daily.     . Continuous Blood Gluc Receiver ([place]) DEVI 1 each by Does not apply route daily. 1 each 0  . Continuous Blood Gluc Sensor (DEXCOM G6 SENSOR) MISC 1 each by Does not apply route daily. 3 each 0  . Continuous Blood Gluc Transmit (DEXCOM G6 TRANSMITTER) MISC 1 each by Does not apply route daily. 1 each 0  . [START ON 09/11/2019] Dulaglutide (TRULICITY) 4.5 DJ/4.9FW SOPN Inject 4.5 mg into the skin every  Sunday. 4 pen 2  . furosemide (LASIX) 40 MG tablet Take 1 tablet (40 mg total) by mouth daily. 90 tablet 3  . gabapentin (NEURONTIN) 300 MG capsule Take 300 mg by mouth 2 (two) times daily.    . metoCLOPramide (REGLAN) 10 MG tablet Take 10 mg by mouth daily as needed for nausea or vomiting.     . metoprolol tartrate (LOPRESSOR) 25 MG tablet Take 12.5-25 mg by mouth 2 (two) times daily.  30 tablet 1  . Multiple Vitamins-Minerals (MULTIVITAMIN ADULTS 50+ PO) Take 1 tablet by mouth daily. Chew    . nitroGLYCERIN (NITROSTAT) 0.4 MG SL tablet Place 0.4 mg under the tongue every 5 (five) minutes as needed for chest pain.     Marland Kitchen ondansetron (ZOFRAN-ODT) 8 MG disintegrating tablet Take 1 tablet (8 mg total)  by mouth every 8 (eight) hours as needed for nausea. 20 tablet 11  . pantoprazole (PROTONIX) 40 MG tablet Take 1 tablet (40 mg total) by mouth daily. 90 tablet 3  . polyethylene glycol powder (GLYCOLAX/MIRALAX) 17 GM/SCOOP powder Take 0.5 Containers by mouth daily as needed for mild constipation or moderate constipation.     . pravastatin (PRAVACHOL) 80 MG tablet Take 1 tablet (80 mg total) by mouth daily. 90 tablet 3  . tamsulosin (FLOMAX) 0.4 MG CAPS capsule Take 0.4 mg by mouth daily.    . temazepam (RESTORIL) 7.5 MG capsule Take 1 capsule (7.5 mg total) by mouth at bedtime as needed for sleep. 30 capsule 3  . TRELEGY ELLIPTA 100-62.5-25 MCG/INH AEPB INHALE 1 PUFF BY MOUTH EVERY DAY (Patient taking differently: Inhale 1 puff into the lungs daily. ) 60 each 11  . triamcinolone (NASACORT) 55 MCG/ACT AERO nasal inhaler PLACE 2 SPRAYS INTO THE NOSE DAILY. (Patient taking differently: Place 2 sprays into the nose daily as needed (congestion). ) 1 Inhaler 11   No current facility-administered medications for this encounter.    Allergies  Allergen Reactions  . Flavoring Agent Other (See Comments)    makes him feel funny    Social History   Socioeconomic History  . Marital status: Married    Spouse  name: Not on file  . Number of children: 2  . Years of education: Not on file  . Highest education level: Not on file  Occupational History  . Not on file  Tobacco Use  . Smoking status: Former Smoker    Packs/day: 1.00    Years: 2.00    Pack years: 2.00    Types: Cigarettes  . Smokeless tobacco: Never Used  . Tobacco comment: smoked 3 years as a teenager  Substance and Sexual Activity  . Alcohol use: No  . Drug use: No  . Sexual activity: Never  Other Topics Concern  . Not on file  Social History Narrative  . Not on file   Social Determinants of Health   Financial Resource Strain:   . Difficulty of Paying Living Expenses:   Food Insecurity:   . Worried About Charity fundraiser in the Last Year:   . Arboriculturist in the Last Year:   Transportation Needs:   . Film/video editor (Medical):   Marland Kitchen Lack of Transportation (Non-Medical):   Physical Activity:   . Days of Exercise per Week:   . Minutes of Exercise per Session:   Stress:   . Feeling of Stress :   Social Connections:   . Frequency of Communication with Friends and Family:   . Frequency of Social Gatherings with Friends and Family:   . Attends Religious Services:   . Active Member of Clubs or Organizations:   . Attends Archivist Meetings:   Marland Kitchen Marital Status:   Intimate Partner Violence:   . Fear of Current or Ex-Partner:   . Emotionally Abused:   Marland Kitchen Physically Abused:   . Sexually Abused:      ROS- All systems are reviewed and negative except as per the HPI above.  Physical Exam: Vitals:   09/09/19 0948  BP: 118/80  Pulse: 72  Weight: 109.1 kg  Height: 5\' 7"  (1.702 m)    GEN- The patient is well appearing obese elderly male, alert and oriented x 3 today.   HEENT-head normocephalic, atraumatic, sclera clear, conjunctiva pink, hearing intact, trachea midline. Lungs- Clear to ausculation bilaterally, normal  work of breathing Heart- Regular rate and rhythm, no murmurs, rubs or gallops   GI- soft, NT, ND, + BS Extremities- no clubbing, cyanosis, or edema MS- no significant deformity or atrophy Skin- no rash or lesion Psych- euthymic mood, full affect Neuro- strength and sensation are intact   Wt Readings from Last 3 Encounters:  09/09/19 109.1 kg  09/02/19 108.4 kg  09/02/19 106.1 kg    EKG today demonstrates SR HR 72, 1st degree AV block, PR 246, QRS 126, QTc 468. Artifact lateral leads.   Echo 05/31/19 demonstrated  1. Left ventricular ejection fraction, by visual estimation, is 60 to  65%. The left ventricle has normal function. There is no left ventricular  hypertrophy.  2. Left ventricular diastolic parameters are consistent with Grade I  diastolic dysfunction (impaired relaxation).  3. The left ventricle has no regional wall motion abnormalities.  4. Global right ventricle has normal systolic function.The right  ventricular size is normal. No increase in right ventricular wall  thickness.  5. Left atrial size was normal.  6. Right atrial size was normal.  7. The mitral valve is grossly normal. No evidence of mitral valve  regurgitation.  8. The tricuspid valve is not well visualized.  9. The tricuspid valve is not well visualized. Tricuspid valve  regurgitation is not demonstrated.  10. The aortic valve is grossly normal. Aortic valve regurgitation is not  visualized. No evidence of aortic valve sclerosis or stenosis.  11. The pulmonic valve was normal in structure. Pulmonic valve  regurgitation is not visualized.  12. Aortic dilatation noted.  13. There is moderate dilatation of the ascending aorta measuring 41 mm.  14. The atrial septum is grossly normal.  Epic records are reviewed at length today  CHA2DS2-VASc Score = 5 The patient's score is based upon: CHF History: No HTN History: Yes Age : 37 + Diabetes History: Yes Stroke History: No Vascular Disease History: Yes Gender: Male   ASSESSMENT AND PLAN: 1. Persistent Atrial  Fibrillation/atrial flutter The patient's CHA2DS2-VASc score is 5, indicating a 7.2% annual risk of stroke.   S/p DCCV on 09/02/19. Patient appears to be maintaining SR.  Continue Eliquis 5 mg BID Decrease amiodarone to 200 mg daily. Check TSH. Continue Lopressor 12.5 mg BID.  2. Secondary Hypercoagulable State (ICD10:  D68.69) The patient is at significant risk for stroke/thromboembolism based upon his CHA2DS2-VASc Score of 5.  Continue Apixaban (Eliquis).   3. Obesity Body mass index is 37.68 kg/m. Lifestyle modification was discussed and encouraged including regular physical activity and weight reduction.  4. CAD S/p CABG 07/12/19 No anginal symptoms.  5. HTN Stable, no changes today.   Follow up with Dr Prescott Gum and Dr Stanford Breed as scheduled.    [facility] [address] [place], [place] [address] [phone] 09/09/2019 10:13 AM

## 2019-09-09 ENCOUNTER — Ambulatory Visit (HOSPITAL_COMMUNITY)
Admission: RE | Admit: 2019-09-09 | Discharge: 2019-09-09 | Disposition: A | Discharge status: Home / Self Care | Payer: Medicare Other | Source: Ambulatory Visit | Attending: Physician Assistant | Admitting: Physician Assistant

## 2019-09-09 ENCOUNTER — Other Ambulatory Visit: Payer: Self-pay

## 2019-09-09 ENCOUNTER — Encounter (HOSPITAL_COMMUNITY): Payer: Self-pay | Admitting: Physician Assistant

## 2019-09-09 ENCOUNTER — Encounter (HOSPITAL_COMMUNITY): Payer: Self-pay | Admitting: *Deleted

## 2019-09-09 VITALS — BP 118/80 | HR 72 | Ht 67.0 in | Wt 240.6 lb

## 2019-09-09 DIAGNOSIS — Z85528 Personal history of other malignant neoplasm of kidney: Secondary | ICD-10-CM | POA: Insufficient documentation

## 2019-09-09 DIAGNOSIS — E785 Hyperlipidemia, unspecified: Secondary | ICD-10-CM | POA: Diagnosis not present

## 2019-09-09 DIAGNOSIS — Z6837 Body mass index (BMI) 37.0-37.9, adult: Secondary | ICD-10-CM | POA: Diagnosis not present

## 2019-09-09 DIAGNOSIS — Z7901 Long term (current) use of anticoagulants: Secondary | ICD-10-CM | POA: Insufficient documentation

## 2019-09-09 DIAGNOSIS — Z87442 Personal history of urinary calculi: Secondary | ICD-10-CM | POA: Diagnosis not present

## 2019-09-09 DIAGNOSIS — M1990 Unspecified osteoarthritis, unspecified site: Secondary | ICD-10-CM | POA: Diagnosis not present

## 2019-09-09 DIAGNOSIS — D6869 Other thrombophilia: Secondary | ICD-10-CM

## 2019-09-09 DIAGNOSIS — Z8052 Family history of malignant neoplasm of bladder: Secondary | ICD-10-CM | POA: Diagnosis not present

## 2019-09-09 DIAGNOSIS — I2510 Atherosclerotic heart disease of native coronary artery without angina pectoris: Secondary | ICD-10-CM | POA: Insufficient documentation

## 2019-09-09 DIAGNOSIS — J449 Chronic obstructive pulmonary disease, unspecified: Secondary | ICD-10-CM | POA: Insufficient documentation

## 2019-09-09 DIAGNOSIS — Z79899 Other long term (current) drug therapy: Secondary | ICD-10-CM | POA: Diagnosis not present

## 2019-09-09 DIAGNOSIS — K219 Gastro-esophageal reflux disease without esophagitis: Secondary | ICD-10-CM | POA: Diagnosis not present

## 2019-09-09 DIAGNOSIS — I4892 Unspecified atrial flutter: Secondary | ICD-10-CM | POA: Insufficient documentation

## 2019-09-09 DIAGNOSIS — Z951 Presence of aortocoronary bypass graft: Secondary | ICD-10-CM | POA: Insufficient documentation

## 2019-09-09 DIAGNOSIS — E669 Obesity, unspecified: Secondary | ICD-10-CM | POA: Insufficient documentation

## 2019-09-09 DIAGNOSIS — Z87891 Personal history of nicotine dependence: Secondary | ICD-10-CM | POA: Diagnosis not present

## 2019-09-09 DIAGNOSIS — Z85810 Personal history of malignant neoplasm of tongue: Secondary | ICD-10-CM | POA: Insufficient documentation

## 2019-09-09 DIAGNOSIS — I4819 Other persistent atrial fibrillation: Secondary | ICD-10-CM | POA: Diagnosis not present

## 2019-09-09 DIAGNOSIS — E1151 Type 2 diabetes mellitus with diabetic peripheral angiopathy without gangrene: Secondary | ICD-10-CM | POA: Diagnosis not present

## 2019-09-09 DIAGNOSIS — I4891 Unspecified atrial fibrillation: Secondary | ICD-10-CM | POA: Diagnosis present

## 2019-09-09 DIAGNOSIS — Z8249 Family history of ischemic heart disease and other diseases of the circulatory system: Secondary | ICD-10-CM | POA: Diagnosis not present

## 2019-09-09 DIAGNOSIS — Z905 Acquired absence of kidney: Secondary | ICD-10-CM | POA: Insufficient documentation

## 2019-09-09 DIAGNOSIS — I10 Essential (primary) hypertension: Secondary | ICD-10-CM | POA: Insufficient documentation

## 2019-09-09 LAB — T4, FREE: Free T4: 1.18 ng/dL — ABNORMAL HIGH (ref 0.61–1.12)

## 2019-09-09 LAB — TSH: TSH: 7.01 u[IU]/mL — ABNORMAL HIGH (ref 0.350–4.500)

## 2019-09-09 MED ORDER — AMIODARONE HCL 200 MG PO TABS: 200.0000 mg | ORAL_TABLET | Freq: Every day | ORAL | 3 refills | Status: DC

## 2019-09-09 NOTE — Patient Instructions (Signed)
Decrease amiodarone to 200mg once a day 

## 2019-09-09 NOTE — Addendum Note (Signed)
Encounter addended by: Juluis Mire, RN on: 09/09/2019 11:43 AM  Actions taken: Order list changed

## 2019-09-17 ENCOUNTER — Other Ambulatory Visit: Payer: Self-pay | Admitting: Surgical

## 2019-09-20 ENCOUNTER — Other Ambulatory Visit: Payer: Self-pay | Admitting: Cardiothoracic Surgery

## 2019-09-20 DIAGNOSIS — Z951 Presence of aortocoronary bypass graft: Secondary | ICD-10-CM

## 2019-09-21 ENCOUNTER — Other Ambulatory Visit: Payer: Self-pay

## 2019-09-21 ENCOUNTER — Ambulatory Visit
Admission: RE | Admit: 2019-09-21 | Discharge: 2019-09-21 | Disposition: A | Discharge status: Home / Self Care | Payer: Medicare Other | Source: Ambulatory Visit | Attending: Cardiothoracic Surgery | Admitting: Cardiothoracic Surgery

## 2019-09-21 ENCOUNTER — Ambulatory Visit (INDEPENDENT_AMBULATORY_CARE_PROVIDER_SITE_OTHER): Payer: Self-pay | Admitting: Cardiothoracic Surgery

## 2019-09-21 ENCOUNTER — Encounter: Payer: Self-pay | Admitting: Cardiothoracic Surgery

## 2019-09-21 VITALS — BP 116/73 | HR 86 | Temp 97.9°F | Resp 20 | Ht 67.0 in | Wt 242.0 lb

## 2019-09-21 DIAGNOSIS — Z951 Presence of aortocoronary bypass graft: Secondary | ICD-10-CM

## 2019-09-21 DIAGNOSIS — J984 Other disorders of lung: Secondary | ICD-10-CM | POA: Diagnosis not present

## 2019-09-21 DIAGNOSIS — I2510 Atherosclerotic heart disease of native coronary artery without angina pectoris: Secondary | ICD-10-CM

## 2019-09-21 DIAGNOSIS — K760 Fatty (change of) liver, not elsewhere classified: Secondary | ICD-10-CM | POA: Diagnosis not present

## 2019-09-21 DIAGNOSIS — I712 Thoracic aortic aneurysm, without rupture: Secondary | ICD-10-CM

## 2019-09-21 DIAGNOSIS — I4819 Other persistent atrial fibrillation: Secondary | ICD-10-CM

## 2019-09-21 DIAGNOSIS — I7121 Aneurysm of the ascending aorta, without rupture: Secondary | ICD-10-CM

## 2019-09-21 MED ORDER — APIXABAN 5 MG PO TABS: 5.0000 mg | ORAL_TABLET | Freq: Two times a day (BID) | ORAL | 2 refills | Status: DC

## 2019-09-27 DIAGNOSIS — N200 Calculus of kidney: Secondary | ICD-10-CM | POA: Diagnosis not present

## 2019-09-27 DIAGNOSIS — K760 Fatty (change of) liver, not elsewhere classified: Secondary | ICD-10-CM | POA: Diagnosis not present

## 2019-10-02 ENCOUNTER — Other Ambulatory Visit: Payer: Self-pay | Admitting: Sports Medicine

## 2019-10-02 DIAGNOSIS — I10 Essential (primary) hypertension: Secondary | ICD-10-CM

## 2019-10-11 NOTE — Progress Notes (Signed)
HPI: Follow-up coronary artery disease. Chest CT February 2021 showed 1.8 cm nodule anterior to the liver and omental metastatic disease should be considered.  Ascending thoracic aorta measured 4 cm.  There was also note of 3 mm left upper lobe nodule.  Cardiac catheterization February 2021 showed 60-70% left main, 50-60% LAD, 60-70% circumflex.  Patient underwent coronary artery bypass and graft March 2021 with LIMA to the LAD, saphenous vein graft to the diagonal, saphenous vein graft to the ramus intermediate and saphenous vein graft to the right coronary artery.  Patient did develop postoperative atrial fibrillation treated with amiodarone.  Abdominal CT March 2021 showed 1.8 cm soft tissue nodule in the anterior inferior perihepatic space and follow-up recommended.  Had syncopal episode and seen at [facility] March 2021.  Echocardiogram at that time showed normal LV function, mild left ventricular hypertrophy and small pericardial effusion.  Developed recurrent atrial fibrillation and had cardioversion April 2021.  Since last seen patient denies dyspnea, chest pain, palpitations or syncope.  Current Outpatient Medications  Medication Sig Dispense Refill  . acetaminophen (TYLENOL) 325 MG tablet Take 650 mg by mouth every 6 (six) hours as needed for mild pain or headache.     . Albuterol Sulfate (PROAIR RESPICLICK) 540 (90 BASE) MCG/ACT AEPB Inhale 2 puffs into the lungs every 4 (four) hours as needed. (Patient taking differently: Inhale 2 puffs into the lungs every 4 (four) hours as needed (shortness of breath). ) 1 each 0  . amiodarone (PACERONE) 200 MG tablet Take 1 tablet (200 mg total) by mouth daily. 30 tablet 3  . apixaban (ELIQUIS) 5 MG TABS tablet Take 1 tablet (5 mg total) by mouth 2 (two) times daily. 60 tablet 2  . aspirin EC 81 MG EC tablet Take 1 tablet (81 mg total) by mouth daily.    Marland Kitchen azelastine (ASTELIN) 0.1 % nasal spray SPRAY 2 SPRAYS INTO EACH NOSTRIL TWICE A DAY AS DIRECTED  (Patient taking differently: Place 2 sprays into both nostrils daily as needed for rhinitis or allergies. ) 30 mL 1  . B Complex Vitamins (B COMPLEX PO) Take 1 tablet by mouth daily.     . Cholecalciferol (VITAMIN D3) 10 MCG (400 UNIT) CHEW Chew 1 tablet by mouth daily.     . Dulaglutide (TRULICITY) 4.5 GQ/6.7YP SOPN Inject 4.5 mg into the skin every Sunday. 4 pen 2  . furosemide (LASIX) 40 MG tablet Take 1 tablet (40 mg total) by mouth daily. 90 tablet 3  . gabapentin (NEURONTIN) 300 MG capsule Take 300 mg by mouth 2 (two) times daily.    . metoCLOPramide (REGLAN) 10 MG tablet Take 10 mg by mouth daily as needed for nausea or vomiting.     . metoprolol tartrate (LOPRESSOR) 25 MG tablet TAKE ONE TABLET (25 MG DOSE) BY MOUTH 2 (TWO) TIMES DAILY. (Patient taking differently: Take 12.5 mg by mouth 2 (two) times daily. ) 60 tablet 1  . Multiple Vitamins-Minerals (MULTIVITAMIN ADULTS 50+ PO) Take 1 tablet by mouth daily. Chew    . nitroGLYCERIN (NITROSTAT) 0.4 MG SL tablet Place 0.4 mg under the tongue every 5 (five) minutes as needed for chest pain.     Marland Kitchen ondansetron (ZOFRAN-ODT) 8 MG disintegrating tablet Take 1 tablet (8 mg total) by mouth every 8 (eight) hours as needed for nausea. 20 tablet 11  . pantoprazole (PROTONIX) 40 MG tablet Take 1 tablet (40 mg total) by mouth daily. 90 tablet 3  . polyethylene glycol powder (  GLYCOLAX/MIRALAX) 17 GM/SCOOP powder Take 0.5 Containers by mouth daily as needed for mild constipation or moderate constipation.     . pravastatin (PRAVACHOL) 80 MG tablet Take 1 tablet (80 mg total) by mouth daily. 90 tablet 3  . temazepam (RESTORIL) 7.5 MG capsule Take 1 capsule (7.5 mg total) by mouth at bedtime as needed for sleep. 30 capsule 3  . TRELEGY ELLIPTA 100-62.5-25 MCG/INH AEPB INHALE 1 PUFF BY MOUTH EVERY DAY (Patient taking differently: Inhale 1 puff into the lungs daily. ) 60 each 11  . triamcinolone (NASACORT) 55 MCG/ACT AERO nasal inhaler PLACE 2 SPRAYS INTO THE NOSE  DAILY. (Patient taking differently: Place 2 sprays into the nose daily as needed (congestion). ) 1 Inhaler 11  . Continuous Blood Gluc Receiver ([place]) DEVI 1 each by Does not apply route daily. (Patient not taking: Reported on 10/12/2019) 1 each 0  . Continuous Blood Gluc Sensor (DEXCOM G6 SENSOR) MISC 1 each by Does not apply route daily. (Patient not taking: Reported on 10/12/2019) 3 each 0  . Continuous Blood Gluc Transmit (DEXCOM G6 TRANSMITTER) MISC 1 each by Does not apply route daily. (Patient not taking: Reported on 10/12/2019) 1 each 0  . tamsulosin (FLOMAX) 0.4 MG CAPS capsule Take 0.4 mg by mouth daily.     No current facility-administered medications for this visit.     Past Medical History:  Diagnosis Date  . Arthritis   . CAD (coronary artery disease)    07/01/2019: LHC with LM disease. Needs CABG.  . Cancer [facility]) 2013   left kidney  . Complication of anesthesia    slow to wake up after kidney stone surgery  . Constipation, chronic   . COPD (chronic obstructive pulmonary disease) ([place])   . Diabetes mellitus ([place])   . Dyspnea    with exertion  . Family history of adverse reaction to anesthesia    daughter couldn't talk for 3-4 days after anesthesia   . Frequent urination   . GERD (gastroesophageal reflux disease)   . Headache   . Hernia   . History of kidney stones   . Hyperlipidemia   . Hypertension   . Peripheral vascular disease ([place])    right leg blood clot 20 plus years ago  . Pneumonia   . Renal insufficiency    stage 2 (only 1 kidney) had Kidney cancer  . Spondylolysis   . Tongue cancer ([place]) 2011    Past Surgical History:  Procedure Laterality Date  . CARDIOVERSION N/A 09/02/2019   Procedure: CARDIOVERSION;  Surgeon: Buford Dresser, MD;  Location: [place];  Service: Cardiovascular;  Laterality: N/A;  . CENTRAL VENOUS CATHETER INSERTION Right 07/12/2019   Procedure: Insertion Central Line Adult;  Surgeon: Ivin Poot, MD;   Location: [place];  Service: Open Heart Surgery;  Laterality: Right;  Right subclavian   . COLONOSCOPY    . CORONARY ARTERY BYPASS GRAFT N/A 07/12/2019   Procedure: CORONARY ARTERY BYPASS GRAFTING (CABG), ON PUMP, TIMES FOUR , USING LEFT INTERNAL MAMMARY ARTERY AND ENDOSCOPICALLY HARVESTED BILATERAL GREATER SAPHENOUS VEINS;  Surgeon: Ivin Poot, MD;  Location: [place];  Service: Open Heart Surgery;  Laterality: N/A;  . HERNIA REPAIR     abdominal  . KIDNEY STONE SURGERY    . LEFT HEART CATH AND CORONARY ANGIOGRAPHY N/A 07/01/2019   Procedure: LEFT HEART CATH AND CORONARY ANGIOGRAPHY;  Surgeon: Belva Crome, MD;  Location: [place] [facility];  Service: Cardiovascular;  Laterality: N/A;  .  NEPHRECTOMY Left 10/08/2011   s/p robotic assisted lap left radical nephrectomy 10/08/11  . PARTIAL GLOSSECTOMY  12/07/2009   s/p partial glossectomy for SCC right tongue 12/07/09  . TEE WITHOUT CARDIOVERSION N/A 07/12/2019   Procedure: TRANSESOPHAGEAL ECHOCARDIOGRAM (TEE);  Surgeon: Prescott Gum, Collier Salina, MD;  Location: [place];  Service: Open Heart Surgery;  Laterality: N/A;    Social History   Socioeconomic History  . Marital status: Married    Spouse name: Not on file  . Number of children: 2  . Years of education: Not on file  . Highest education level: Not on file  Occupational History  . Not on file  Tobacco Use  . Smoking status: Former Smoker    Packs/day: 1.00    Years: 2.00    Pack years: 2.00    Types: Cigarettes  . Smokeless tobacco: Never Used  . Tobacco comment: smoked 3 years as a teenager  Substance and Sexual Activity  . Alcohol use: No  . Drug use: No  . Sexual activity: Never  Other Topics Concern  . Not on file  Social History Narrative  . Not on file   Social Determinants of Health   Financial Resource Strain:   . Difficulty of Paying Living Expenses:   Food Insecurity:   . Worried About Charity fundraiser in the Last Year:   . Arboriculturist in the Last Year:     Transportation Needs:   . Film/video editor (Medical):   Marland Kitchen Lack of Transportation (Non-Medical):   Physical Activity:   . Days of Exercise per Week:   . Minutes of Exercise per Session:   Stress:   . Feeling of Stress :   Social Connections:   . Frequency of Communication with Friends and Family:   . Frequency of Social Gatherings with Friends and Family:   . Attends Religious Services:   . Active Member of Clubs or Organizations:   . Attends Archivist Meetings:   Marland Kitchen Marital Status:   Intimate Partner Violence:   . Fear of Current or Ex-Partner:   . Emotionally Abused:   Marland Kitchen Physically Abused:   . Sexually Abused:     Family History  Problem Relation Age of Onset  . Cancer Mother        bladder  . Heart attack Father     ROS: no fevers or chills, productive cough, hemoptysis, dysphasia, odynophagia, melena, hematochezia, dysuria, hematuria, rash, seizure activity, orthopnea, PND, pedal edema, claudication. Remaining systems are negative.  Physical Exam: Well-developed well-nourished in no acute distress.  Skin is warm and dry.  HEENT is normal.  Neck is supple.  Chest is clear to auscultation with normal expansion.  Cardiovascular exam is regular rate and rhythm.  Abdominal exam nontender or distended. No masses palpated. Extremities show no edema. neuro grossly intact  ECG-sinus rhythm with first-degree AV block, incomplete right bundle branch block.  Personally reviewed  A/P  1 coronary artery disease status post coronary artery bypass graft-patient denies chest pain.  Plan to continue medical therapy with aspirin and statin.  2 paroxysmal atrial fibrillation-he remains in sinus rhythm on examination.  Question of this was postoperative atrial fibrillation.  We will continue amiodarone for 8 weeks and then discontinue if he maintains sinus rhythm.  Continue apixaban long-term.  3 mass anterior perihepatic space-now followed by gastroenterology.  4  hypertension-blood pressure controlled.  Continue present medications.  5 hyperlipidemia-continue statin.  6 dilated aortic root-plan follow-up noncontrast chest  CT February 2022.  7 lung nodule-follow-up noncontrast chest CT February 2022.  8 chronic stage III renal insufficiency-follow-up nephrology.  Kirk Ruths, MD

## 2019-10-12 ENCOUNTER — Other Ambulatory Visit: Payer: Self-pay

## 2019-10-12 ENCOUNTER — Encounter: Payer: Self-pay | Admitting: Cardiology

## 2019-10-12 ENCOUNTER — Ambulatory Visit (INDEPENDENT_AMBULATORY_CARE_PROVIDER_SITE_OTHER): Payer: Medicare Other | Admitting: Cardiology

## 2019-10-12 VITALS — BP 134/85 | HR 71 | Ht 67.0 in | Wt 240.4 lb

## 2019-10-12 DIAGNOSIS — I4819 Other persistent atrial fibrillation: Secondary | ICD-10-CM

## 2019-10-12 DIAGNOSIS — I10 Essential (primary) hypertension: Secondary | ICD-10-CM

## 2019-10-12 DIAGNOSIS — E7800 Pure hypercholesterolemia, unspecified: Secondary | ICD-10-CM | POA: Diagnosis not present

## 2019-10-12 DIAGNOSIS — I712 Thoracic aortic aneurysm, without rupture, unspecified: Secondary | ICD-10-CM

## 2019-10-12 DIAGNOSIS — I2510 Atherosclerotic heart disease of native coronary artery without angina pectoris: Secondary | ICD-10-CM

## 2019-10-12 NOTE — Patient Instructions (Signed)
Medication Instructions:  NO CHANGE *If you need a refill on your cardiac medications before your next appointment, please call your pharmacy*   Lab Work: If you have labs (blood work) drawn today and your tests are completely normal, you will receive your results only by: Marland Kitchen MyChart Message (if you have MyChart) OR . A paper copy in the mail If you have any lab test that is abnormal or we need to change your treatment, we will call you to review the results.   Follow-Up: At [facility], you and your health needs are our priority.  As part of our continuing mission to provide you with exceptional heart care, we have created designated Provider Care Teams.  These Care Teams include your primary Cardiologist (physician) and Advanced Practice Providers (APPs -  Physician Assistants and Nurse Practitioners) who all work together to provide you with the care you need, when you need it.  We recommend signing up for the patient portal called "MyChart".  Sign up information is provided on this After Visit Summary.  MyChart is used to connect with patients for Virtual Visits (Telemedicine).  Patients are able to view lab/test results, encounter notes, upcoming appointments, etc.  Non-urgent messages can be sent to your provider as well.   To learn more about what you can do with MyChart, go to [email or web address].    Your next appointment:   8 week(s)  The format for your next appointment:   In Person  Provider:   Kirk Ruths, MD

## 2019-10-18 DIAGNOSIS — C786 Secondary malignant neoplasm of retroperitoneum and peritoneum: Secondary | ICD-10-CM | POA: Diagnosis not present

## 2019-10-18 DIAGNOSIS — Z7901 Long term (current) use of anticoagulants: Secondary | ICD-10-CM | POA: Diagnosis not present

## 2019-10-18 DIAGNOSIS — I480 Paroxysmal atrial fibrillation: Secondary | ICD-10-CM | POA: Diagnosis not present

## 2019-10-18 DIAGNOSIS — K668 Other specified disorders of peritoneum: Secondary | ICD-10-CM | POA: Diagnosis not present

## 2019-10-18 DIAGNOSIS — R1900 Intra-abdominal and pelvic swelling, mass and lump, unspecified site: Secondary | ICD-10-CM | POA: Diagnosis not present

## 2019-10-26 ENCOUNTER — Other Ambulatory Visit: Payer: Self-pay | Admitting: Sports Medicine

## 2019-10-26 DIAGNOSIS — IMO0002 Reserved for concepts with insufficient information to code with codable children: Secondary | ICD-10-CM

## 2019-10-26 DIAGNOSIS — E1121 Type 2 diabetes mellitus with diabetic nephropathy: Secondary | ICD-10-CM

## 2019-11-02 ENCOUNTER — Ambulatory Visit: Payer: Medicare Other | Admitting: Sports Medicine

## 2019-11-02 DIAGNOSIS — Z905 Acquired absence of kidney: Secondary | ICD-10-CM | POA: Diagnosis not present

## 2019-11-02 DIAGNOSIS — Z7982 Long term (current) use of aspirin: Secondary | ICD-10-CM | POA: Diagnosis not present

## 2019-11-02 DIAGNOSIS — Z87891 Personal history of nicotine dependence: Secondary | ICD-10-CM | POA: Diagnosis not present

## 2019-11-02 DIAGNOSIS — Z79899 Other long term (current) drug therapy: Secondary | ICD-10-CM | POA: Diagnosis not present

## 2019-11-02 DIAGNOSIS — E1122 Type 2 diabetes mellitus with diabetic chronic kidney disease: Secondary | ICD-10-CM | POA: Diagnosis not present

## 2019-11-02 DIAGNOSIS — N1832 Chronic kidney disease, stage 3b: Secondary | ICD-10-CM | POA: Diagnosis not present

## 2019-11-02 DIAGNOSIS — J449 Chronic obstructive pulmonary disease, unspecified: Secondary | ICD-10-CM | POA: Diagnosis not present

## 2019-11-02 DIAGNOSIS — D649 Anemia, unspecified: Secondary | ICD-10-CM | POA: Diagnosis not present

## 2019-11-02 DIAGNOSIS — Z951 Presence of aortocoronary bypass graft: Secondary | ICD-10-CM | POA: Diagnosis not present

## 2019-11-02 DIAGNOSIS — I129 Hypertensive chronic kidney disease with stage 1 through stage 4 chronic kidney disease, or unspecified chronic kidney disease: Secondary | ICD-10-CM | POA: Diagnosis not present

## 2019-11-02 DIAGNOSIS — Z7984 Long term (current) use of oral hypoglycemic drugs: Secondary | ICD-10-CM | POA: Diagnosis not present

## 2019-11-02 DIAGNOSIS — N400 Enlarged prostate without lower urinary tract symptoms: Secondary | ICD-10-CM | POA: Diagnosis not present

## 2019-11-02 DIAGNOSIS — Z7902 Long term (current) use of antithrombotics/antiplatelets: Secondary | ICD-10-CM | POA: Diagnosis not present

## 2019-11-02 DIAGNOSIS — C642 Malignant neoplasm of left kidney, except renal pelvis: Secondary | ICD-10-CM | POA: Diagnosis not present

## 2019-11-02 DIAGNOSIS — I25810 Atherosclerosis of coronary artery bypass graft(s) without angina pectoris: Secondary | ICD-10-CM | POA: Diagnosis not present

## 2019-11-02 DIAGNOSIS — Z7951 Long term (current) use of inhaled steroids: Secondary | ICD-10-CM | POA: Diagnosis not present

## 2019-11-02 DIAGNOSIS — Z85818 Personal history of malignant neoplasm of other sites of lip, oral cavity, and pharynx: Secondary | ICD-10-CM | POA: Diagnosis not present

## 2019-11-02 DIAGNOSIS — I10 Essential (primary) hypertension: Secondary | ICD-10-CM | POA: Diagnosis not present

## 2019-11-02 DIAGNOSIS — Z6837 Body mass index (BMI) 37.0-37.9, adult: Secondary | ICD-10-CM | POA: Diagnosis not present

## 2019-11-02 DIAGNOSIS — I480 Paroxysmal atrial fibrillation: Secondary | ICD-10-CM | POA: Diagnosis not present

## 2019-11-04 DIAGNOSIS — C642 Malignant neoplasm of left kidney, except renal pelvis: Secondary | ICD-10-CM | POA: Diagnosis not present

## 2019-11-09 ENCOUNTER — Encounter: Payer: Self-pay | Admitting: Sports Medicine

## 2019-11-09 ENCOUNTER — Ambulatory Visit (INDEPENDENT_AMBULATORY_CARE_PROVIDER_SITE_OTHER): Payer: Medicare Other | Admitting: Sports Medicine

## 2019-11-09 ENCOUNTER — Other Ambulatory Visit: Payer: Self-pay

## 2019-11-09 VITALS — BP 104/66 | HR 81 | Ht 67.0 in | Wt 240.0 lb

## 2019-11-09 DIAGNOSIS — I10 Essential (primary) hypertension: Secondary | ICD-10-CM | POA: Diagnosis not present

## 2019-11-09 DIAGNOSIS — E1121 Type 2 diabetes mellitus with diabetic nephropathy: Secondary | ICD-10-CM | POA: Diagnosis not present

## 2019-11-09 DIAGNOSIS — M51369 Other intervertebral disc degeneration, lumbar region without mention of lumbar back pain or lower extremity pain: Secondary | ICD-10-CM

## 2019-11-09 DIAGNOSIS — N400 Enlarged prostate without lower urinary tract symptoms: Secondary | ICD-10-CM | POA: Diagnosis not present

## 2019-11-09 DIAGNOSIS — F5105 Insomnia due to other mental disorder: Secondary | ICD-10-CM

## 2019-11-09 DIAGNOSIS — F4321 Adjustment disorder with depressed mood: Secondary | ICD-10-CM

## 2019-11-09 DIAGNOSIS — I2510 Atherosclerotic heart disease of native coronary artery without angina pectoris: Secondary | ICD-10-CM | POA: Diagnosis not present

## 2019-11-09 DIAGNOSIS — C649 Malignant neoplasm of unspecified kidney, except renal pelvis: Secondary | ICD-10-CM | POA: Diagnosis not present

## 2019-11-09 DIAGNOSIS — M5136 Other intervertebral disc degeneration, lumbar region: Secondary | ICD-10-CM

## 2019-11-09 LAB — POCT GLYCOSYLATED HEMOGLOBIN (HGB A1C): Hemoglobin A1C: 6.7 % — AB (ref 4.0–5.6)

## 2019-11-09 MED ORDER — SERTRALINE HCL 50 MG PO TABS: 50.0000 mg | ORAL_TABLET | Freq: Every day | ORAL | 3 refills | Status: DC

## 2019-11-09 MED ORDER — HYDROCODONE-ACETAMINOPHEN 5-325 MG PO TABS: 1.0000 | ORAL_TABLET | Freq: Three times a day (TID) | ORAL | 0 refills | Status: DC | PRN

## 2019-11-09 MED ORDER — FREESTYLE LIBRE 14 DAY READER DEVI: 1.0000 "application " | 0 refills | Status: DC

## 2019-11-09 MED ORDER — FREESTYLE LIBRE 14 DAY SENSOR MISC: 1.0000 "application " | 11 refills | Status: DC

## 2019-11-09 NOTE — Assessment & Plan Note (Signed)
Approximately 4 months ago Clinton Ortiz had his Trulicity restarted, glipizide was discontinued, we went to high-dose Trulicity. His hemoglobin A1c is 6.7% today, well controlled, no changes needed. Dexcom continuous glucometer was too expensive, switching to freestyle libre. He will continue to identify foods that raise his blood sugar rapidly and try to avoid them. Other than that he is up-to-date on his diabetes screening measures.

## 2019-11-09 NOTE — Progress Notes (Signed)
    Procedures performed today:    None.  Independent interpretation of notes and tests performed by another provider:   None.  Brief History, Exam, Impression, and Recommendations:    Benign prostatic hyperplasia Clinton Ortiz has BPH with obstructive uropathy. He was taken off of his Flomax during his hospitalization, he was placed on furosemide and there was concern for hypertension. Blood pressures have been running okay, Flomax was restarted and his urination has improved considerably, he can void easily, and he only has one episode of nocturia. We could certainly go to 2 pills daily in the future if needed or even add tadalafil.   Controlled type 2 diabetes mellitus with diabetic nephropathy (Clinton Ortiz) Approximately 4 months ago Clinton Ortiz had his Trulicity restarted, glipizide was discontinued, we went to high-dose Trulicity. His hemoglobin A1c is 6.7% today, well controlled, no changes needed. Dexcom continuous glucometer was too expensive, switching to freestyle libre. He will continue to identify foods that raise his blood sugar rapidly and try to avoid them. Other than that he is up-to-date on his diabetes screening measures.  History of renal cancer status post left nephrectomy Clinton Ortiz has a history of clear cell carcinoma post left nephrectomy back in 2013. He was also recently noted to have a nodule anterior to his liver, this had enlarged in size, he underwent a CT-guided biopsy that showed that this was a metastatic focus of clear cell carcinoma from his left renal cell carcinoma. I do not think any operative intervention is planned, he is going to be undergoing immunotherapy with oncology. We will follow along.  Hypertension Well-controlled today.  Lumbar degenerative disc disease Clinton Ortiz did not really respond to lumbar epidurals, and he had not done his L3-S1 facet joint injections. He has a lot coming up in terms of procedures so we are going to hold off on further intervention to his  lumbar spine, tramadol has been ineffective, I am happy to increase to hydrocodone.  Insomnia secondary to situational depression Clinton Ortiz has a lot going on, treatment for metastatic renal cell carcinoma, he is looking at a full dental extraction for gum disease, he recently had CABG, his wife is sick and debilitated. Not surprisingly he is having some insomnia, he was tearful in the exam room today. We are going to start Zoloft at 50 mg daily with a 6-week follow-up.    ___________________________________________ Clinton Her. Clinton Ortiz, M.D., ABFM., CAQSM. Primary Care and [place] Instructor of [place] of [facility] of [employer]

## 2019-11-09 NOTE — Assessment & Plan Note (Signed)
Clinton Ortiz has a lot going on, treatment for metastatic renal cell carcinoma, he is looking at a full dental extraction for gum disease, he recently had CABG, his wife is sick and debilitated. Not surprisingly he is having some insomnia, he was tearful in the exam room today. We are going to start Zoloft at 50 mg daily with a 6-week follow-up.

## 2019-11-09 NOTE — Assessment & Plan Note (Signed)
[place] did not really respond to lumbar epidurals, and he had not done his L3-S1 facet joint injections. He has a lot coming up in terms of procedures so we are going to hold off on further intervention to his lumbar spine, tramadol has been ineffective, I am happy to increase to hydrocodone.

## 2019-11-09 NOTE — Assessment & Plan Note (Signed)
Prayan has a history of clear cell carcinoma post left nephrectomy back in 2013. He was also recently noted to have a nodule anterior to his liver, this had enlarged in size, he underwent a CT-guided biopsy that showed that this was a metastatic focus of clear cell carcinoma from his left renal cell carcinoma. I do not think any operative intervention is planned, he is going to be undergoing immunotherapy with oncology. We will follow along.

## 2019-11-09 NOTE — Assessment & Plan Note (Signed)
Well controlled today.

## 2019-11-09 NOTE — Assessment & Plan Note (Signed)
Clinton Ortiz has BPH with obstructive uropathy. He was taken off of his Flomax during his hospitalization, he was placed on furosemide and there was concern for hypertension. Blood pressures have been running okay, Flomax was restarted and his urination has improved considerably, he can void easily, and he only has one episode of nocturia. We could certainly go to 2 pills daily in the future if needed or even add tadalafil.

## 2019-11-11 ENCOUNTER — Telehealth: Payer: Self-pay | Admitting: Cardiology

## 2019-11-11 DIAGNOSIS — C649 Malignant neoplasm of unspecified kidney, except renal pelvis: Secondary | ICD-10-CM | POA: Diagnosis not present

## 2019-11-11 DIAGNOSIS — C787 Secondary malignant neoplasm of liver and intrahepatic bile duct: Secondary | ICD-10-CM | POA: Diagnosis not present

## 2019-11-11 NOTE — Telephone Encounter (Signed)
Dr. Ottie Glazier Urologist with [facility] in [place] called to speak with Dr. Stanford Breed after the holiday weekend on Tuesday about the patient possibly needing a surgery. I sent him a staff message with Dr. Marchelle Gearing number.

## 2019-11-15 ENCOUNTER — Encounter: Payer: Self-pay | Admitting: Cardiology

## 2019-11-15 ENCOUNTER — Telehealth: Payer: Self-pay | Admitting: Cardiology

## 2019-11-15 NOTE — Telephone Encounter (Signed)
Sent staff message to Dr. Stanford Breed and Hilda Blades due to Dr. Vernard Gambles giving out his personal number.

## 2019-11-15 NOTE — Progress Notes (Signed)
Contacted by Dr Vernard Gambles of urology; pt has been diagnosed with metastatic renal cell carcinoma; Dr Vernard Gambles plans resection of liver met. Pt has had recent CABG (3/21) and DCCV of atrial fibrillation (4/21). I feel pt can proceed with surgery without further cardiac eval as he has not had chest pain and has had recent revascularization. Hold apixaban 3 days prior to procedure and resume postop when ok with surgery. Clinton Ortiz

## 2019-11-23 DIAGNOSIS — Z7183 Encounter for nonprocreative genetic counseling: Secondary | ICD-10-CM | POA: Diagnosis not present

## 2019-11-23 DIAGNOSIS — Z800 Family history of malignant neoplasm of digestive organs: Secondary | ICD-10-CM | POA: Diagnosis not present

## 2019-11-23 DIAGNOSIS — Z8051 Family history of malignant neoplasm of kidney: Secondary | ICD-10-CM | POA: Diagnosis not present

## 2019-11-23 DIAGNOSIS — C642 Malignant neoplasm of left kidney, except renal pelvis: Secondary | ICD-10-CM | POA: Diagnosis not present

## 2019-12-03 ENCOUNTER — Other Ambulatory Visit: Payer: Self-pay | Admitting: Sports Medicine

## 2019-12-03 DIAGNOSIS — I10 Essential (primary) hypertension: Secondary | ICD-10-CM

## 2019-12-08 NOTE — Progress Notes (Signed)
HPI: Follow-up coronary artery disease. Chest CT February 2021 showed 1.8 cm nodule anterior to the liver and omental metastatic disease should be considered.  Ascending thoracic aorta measured 4 cm.  There was also note of 3 mm left upper lobe nodule.  Cardiac catheterization February 2021 showed 60-70% left main, 50-60% LAD, 60-70% circumflex.  Patient underwent coronary artery bypass and graft March 2021 with LIMA to the LAD, saphenous vein graft to the diagonal, saphenous vein graft to the ramus intermediate and saphenous vein graft to the right coronary artery.  Patient did develop postoperative atrial fibrillation treated with amiodarone.  Abdominal CT March 2021 showed 1.8 cm soft tissue nodule in the anterior inferior perihepatic space and follow-up recommended.  Had syncopal episode and seen at [facility] March 2021.  Echocardiogram at that time showed normal LV function, mild left ventricular hypertrophy and small pericardial effusion.  Developed recurrent atrial fibrillation and had cardioversion April 2021.  Since last seen  he does have some dyspnea on exertion unchanged.  No orthopnea, PND, pedal edema, chest pain or syncope.  No palpitations.  He is having problems with dizziness with standing.  Current Outpatient Medications  Medication Sig Dispense Refill  . Albuterol Sulfate (PROAIR RESPICLICK) 720 (90 BASE) MCG/ACT AEPB Inhale 2 puffs into the lungs every 4 (four) hours as needed. (Patient taking differently: Inhale 2 puffs into the lungs every 4 (four) hours as needed (shortness of breath). ) 1 each 0  . amiodarone (PACERONE) 200 MG tablet Take 1 tablet (200 mg total) by mouth daily. 30 tablet 3  . apixaban (ELIQUIS) 5 MG TABS tablet Take 1 tablet (5 mg total) by mouth 2 (two) times daily. 60 tablet 2  . Ascorbic Acid (VITAMIN C PO) Take by mouth.    Marland Kitchen aspirin EC 81 MG EC tablet Take 1 tablet (81 mg total) by mouth daily.    Marland Kitchen azelastine (ASTELIN) 0.1 % nasal spray SPRAY 2 SPRAYS  INTO EACH NOSTRIL TWICE A DAY AS DIRECTED (Patient taking differently: Place 2 sprays into both nostrils daily as needed for rhinitis or allergies. ) 30 mL 1  . Continuous Blood Gluc Receiver (FREESTYLE LIBRE 14 DAY READER) DEVI 1 application by Does not apply route every 14 (fourteen) days. Use with sensor system 1 each 0  . Continuous Blood Gluc Sensor (FREESTYLE LIBRE 14 DAY SENSOR) MISC 1 application by Does not apply route every 14 (fourteen) days. Apply upper deltoid every 14 days, use reader to determine blood sugars 2 each 11  . Dulaglutide (TRULICITY) 4.5 NO/7.0JG SOPN Inject 4.5 mg into the skin every Sunday. 4 pen 2  . furosemide (LASIX) 40 MG tablet Take 20 mg by mouth 2 (two) times daily.  90 tablet 3  . gabapentin (NEURONTIN) 300 MG capsule TAKE 1 CAPSULE BY MOUTH IN THE MORNING, 2 CAPSULES AT BEDTIME (Patient taking differently: Take 300 mg by mouth 2 (two) times daily. ) 90 capsule 3  . HYDROcodone-acetaminophen (NORCO/VICODIN) 5-325 MG tablet Take 1 tablet by mouth every 8 (eight) hours as needed for moderate pain. 15 tablet 0  . metoCLOPramide (REGLAN) 10 MG tablet Take 10 mg by mouth daily as needed for nausea or vomiting.     . metoprolol tartrate (LOPRESSOR) 25 MG tablet TAKE ONE TABLET BY MOUTH 2 TIMES DAILY. (Patient taking differently: Take 12.5 mg by mouth 2 (two) times daily. ) 60 tablet 1  . nitroGLYCERIN (NITROSTAT) 0.4 MG SL tablet Place 0.4 mg under the tongue every  5 (five) minutes as needed for chest pain.     Marland Kitchen ondansetron (ZOFRAN-ODT) 8 MG disintegrating tablet TAKE 1 TABLET BY MOUTH EVERY 8 HOURS AS NEEDED FOR NAUSEA 20 tablet 11  . pantoprazole (PROTONIX) 40 MG tablet Take 1 tablet (40 mg total) by mouth daily. 90 tablet 3  . polyethylene glycol powder (GLYCOLAX/MIRALAX) 17 GM/SCOOP powder Take 0.5 Containers by mouth daily as needed for mild constipation or moderate constipation.     . pravastatin (PRAVACHOL) 80 MG tablet Take 1 tablet (80 mg total) by mouth daily.  90 tablet 3  . sertraline (ZOLOFT) 50 MG tablet Take 1 tablet (50 mg total) by mouth daily. 30 tablet 3  . tamsulosin (FLOMAX) 0.4 MG CAPS capsule Take 0.4 mg by mouth daily.    . temazepam (RESTORIL) 7.5 MG capsule Take 1 capsule (7.5 mg total) by mouth at bedtime as needed for sleep. 30 capsule 3  . Thiamine HCl (VITAMIN B-1 PO) Take by mouth.    . TRELEGY ELLIPTA 100-62.5-25 MCG/INH AEPB INHALE 1 PUFF BY MOUTH EVERY DAY (Patient taking differently: Inhale 1 puff into the lungs daily. ) 60 each 11  . triamcinolone (NASACORT) 55 MCG/ACT AERO nasal inhaler PLACE 2 SPRAYS INTO THE NOSE DAILY. (Patient taking differently: Place 2 sprays into the nose daily as needed (congestion). ) 1 Inhaler 11  . VITAMIN D PO Take by mouth.     No current facility-administered medications for this visit.     Past Medical History:  Diagnosis Date  . Arthritis   . CAD (coronary artery disease)    07/01/2019: LHC with LM disease. Needs CABG.  . Cancer [facility]) 2013   left kidney  . Complication of anesthesia    slow to wake up after kidney stone surgery  . Constipation, chronic   . COPD (chronic obstructive pulmonary disease) ([place])   . Diabetes mellitus ([place])   . Dyspnea    with exertion  . Family history of adverse reaction to anesthesia    daughter couldn't talk for 3-4 days after anesthesia   . Frequent urination   . GERD (gastroesophageal reflux disease)   . Headache   . Hernia   . History of kidney stones   . Hyperlipidemia   . Hypertension   . Peripheral vascular disease ([place])    right leg blood clot 20 plus years ago  . Pneumonia   . Renal insufficiency    stage 2 (only 1 kidney) had Kidney cancer  . Spondylolysis   . Tongue cancer ([place]) 2011    Past Surgical History:  Procedure Laterality Date  . CARDIOVERSION N/A 09/02/2019   Procedure: CARDIOVERSION;  Surgeon: Buford Dresser, MD;  Location: [place];  Service: Cardiovascular;  Laterality: N/A;  . CENTRAL VENOUS CATHETER  INSERTION Right 07/12/2019   Procedure: Insertion Central Line Adult;  Surgeon: Ivin Poot, MD;  Location: [place];  Service: Open Heart Surgery;  Laterality: Right;  Right subclavian   . COLONOSCOPY    . CORONARY ARTERY BYPASS GRAFT N/A 07/12/2019   Procedure: CORONARY ARTERY BYPASS GRAFTING (CABG), ON PUMP, TIMES FOUR , USING LEFT INTERNAL MAMMARY ARTERY AND ENDOSCOPICALLY HARVESTED BILATERAL GREATER SAPHENOUS VEINS;  Surgeon: Ivin Poot, MD;  Location: [place];  Service: Open Heart Surgery;  Laterality: N/A;  . HERNIA REPAIR     abdominal  . KIDNEY STONE SURGERY    . LEFT HEART CATH AND CORONARY ANGIOGRAPHY N/A 07/01/2019   Procedure: LEFT HEART CATH AND CORONARY ANGIOGRAPHY;  Surgeon:  Belva Crome, MD;  Location: [place] CV LAB;  Service: Cardiovascular;  Laterality: N/A;  . NEPHRECTOMY Left 10/08/2011   s/p robotic assisted lap left radical nephrectomy 10/08/11  . PARTIAL GLOSSECTOMY  12/07/2009   s/p partial glossectomy for SCC right tongue 12/07/09  . TEE WITHOUT CARDIOVERSION N/A 07/12/2019   Procedure: TRANSESOPHAGEAL ECHOCARDIOGRAM (TEE);  Surgeon: Prescott Gum, Collier Salina, MD;  Location: [place];  Service: Open Heart Surgery;  Laterality: N/A;    Social History   Socioeconomic History  . Marital status: Married    Spouse name: Not on file  . Number of children: 2  . Years of education: Not on file  . Highest education level: Not on file  Occupational History  . Not on file  Tobacco Use  . Smoking status: Former Smoker    Packs/day: 1.00    Years: 2.00    Pack years: 2.00    Types: Cigarettes  . Smokeless tobacco: Never Used  . Tobacco comment: smoked 3 years as a teenager  Substance and Sexual Activity  . Alcohol use: No  . Drug use: No  . Sexual activity: Never  Other Topics Concern  . Not on file  Social History Narrative  . Not on file   Social Determinants of Health   Financial Resource Strain:   . Difficulty of Paying Living Expenses:   Food Insecurity:     . Worried About Charity fundraiser in the Last Year:   . Arboriculturist in the Last Year:   Transportation Needs:   . Film/video editor (Medical):   Marland Kitchen Lack of Transportation (Non-Medical):   Physical Activity:   . Days of Exercise per Week:   . Minutes of Exercise per Session:   Stress:   . Feeling of Stress :   Social Connections:   . Frequency of Communication with Friends and Family:   . Frequency of Social Gatherings with Friends and Family:   . Attends Religious Services:   . Active Member of Clubs or Organizations:   . Attends Archivist Meetings:   Marland Kitchen Marital Status:   Intimate Partner Violence:   . Fear of Current or Ex-Partner:   . Emotionally Abused:   Marland Kitchen Physically Abused:   . Sexually Abused:     Family History  Problem Relation Age of Onset  . Cancer Mother        bladder  . Heart attack Father     ROS: no fevers or chills, productive cough, hemoptysis, dysphasia, odynophagia, melena, hematochezia, dysuria, hematuria, rash, seizure activity, orthopnea, PND, pedal edema, claudication. Remaining systems are negative.  Physical Exam: Well-developed well-nourished in no acute distress.  Skin is warm and dry.  HEENT is normal.  Neck is supple.  Chest is clear to auscultation with normal expansion.  Cardiovascular exam is regular rate and rhythm.  Abdominal exam nontender or distended. No masses palpated. Extremities show no edema. neuro grossly intact  Electrocardiogram today shows sinus rhythm at a rate of 70, RV conduction delay, no ST changes.  A/P  1 coronary artery disease status post coronary artery bypass graft-patient denies chest pain.  Plan to continue medical therapy with aspirin and statin.  2 hypertension-blood pressure controlled.  However he is having orthostatic symptoms.  I will change Lasix to 40 mg by mouth daily as needed.  He will watch for any worsening dyspnea or edema.  3 hyperlipidemia-continue statin.  4  postoperative atrial fibrillation-patient remains in sinus rhythm.  This certainly could have been postoperative but occurred approximately 1 month after surgery.  I would like to avoid amiodarone long-term if possible.  He has upcoming abdominal surgery to resect a metastatic renal cell cancer focus.  I will see him back approximately 6 to 8 weeks following his surgery and if he remains in sinus we will discontinue amiodarone at that time and follow for recurrent atrial fibrillation.  I will continue apixaban.  He will hold this 3 days prior to his upcoming surgery and resume after when okay with urology.  5 dilated aortic root-follow-up noncontrast chest CT February 2022.  6 lung nodule-follow-up noncontrast chest CT February 2022.  7 chronic stage III kidney disease-monitored by nephrology.  8 mass in the anterior perihepatic space-now followed by gastroenterology.  9 preoperative evaluation prior to resection of metastatic renal cell focus-patient has been revascularized and is not having chest pain.  Okay to proceed with surgery from a cardiac standpoint.  Kirk Ruths, MD

## 2019-12-14 ENCOUNTER — Other Ambulatory Visit: Payer: Self-pay

## 2019-12-14 ENCOUNTER — Encounter: Payer: Self-pay | Admitting: Cardiology

## 2019-12-14 ENCOUNTER — Ambulatory Visit (INDEPENDENT_AMBULATORY_CARE_PROVIDER_SITE_OTHER): Payer: Medicare Other | Admitting: Cardiology

## 2019-12-14 VITALS — BP 133/84 | HR 70 | Ht 67.0 in | Wt 238.1 lb

## 2019-12-14 DIAGNOSIS — I4819 Other persistent atrial fibrillation: Secondary | ICD-10-CM | POA: Diagnosis not present

## 2019-12-14 DIAGNOSIS — E7800 Pure hypercholesterolemia, unspecified: Secondary | ICD-10-CM

## 2019-12-14 DIAGNOSIS — I10 Essential (primary) hypertension: Secondary | ICD-10-CM | POA: Diagnosis not present

## 2019-12-14 DIAGNOSIS — I712 Thoracic aortic aneurysm, without rupture, unspecified: Secondary | ICD-10-CM

## 2019-12-14 DIAGNOSIS — I2510 Atherosclerotic heart disease of native coronary artery without angina pectoris: Secondary | ICD-10-CM | POA: Diagnosis not present

## 2019-12-14 MED ORDER — FUROSEMIDE 40 MG PO TABS: 40.0000 mg | ORAL_TABLET | Freq: Every day | ORAL | 3 refills | Status: DC | PRN

## 2019-12-14 NOTE — Patient Instructions (Signed)
Medication Instructions:   DECREASE FUROSEMIDE TOP 40 MG ONCE DAILY AS NEEDED FOR SWELLING  OKAY TO HOLD ELIQUIS 3 DAYS PRIOR TO SURGERY AND THEN RESTART WHEN OKAY WITH SURGEON  *If you need a refill on your cardiac medications before your next appointment, please call your pharmacy*   Lab Work: If you have labs (blood work) drawn today and your tests are completely normal, you will receive your results only by:  MyChart Message (if you have MyChart) OR  A paper copy in the mail If you have any lab test that is abnormal or we need to change your treatment, we will call you to review the results   Follow-Up: At [facility], you and your health needs are our priority.  As part of our continuing mission to provide you with exceptional heart care, we have created designated Provider Care Teams.  These Care Teams include your primary Cardiologist (physician) and Advanced Practice Providers (APPs -  Physician Assistants and Nurse Practitioners) who all work together to provide you with the care you need, when you need it.  We recommend signing up for the patient portal called "MyChart".  Sign up information is provided on this After Visit Summary.  MyChart is used to connect with patients for Virtual Visits (Telemedicine).  Patients are able to view lab/test results, encounter notes, upcoming appointments, etc.  Non-urgent messages can be sent to your provider as well.   To learn more about what you can do with MyChart, go to [email or web address].    Your next appointment:    8-12 WEEKS WITH DR Stanford Breed

## 2019-12-20 ENCOUNTER — Other Ambulatory Visit: Payer: Self-pay | Admitting: Sports Medicine

## 2019-12-21 ENCOUNTER — Other Ambulatory Visit: Payer: Self-pay

## 2019-12-21 ENCOUNTER — Encounter: Payer: Self-pay | Admitting: Sports Medicine

## 2019-12-21 ENCOUNTER — Ambulatory Visit (INDEPENDENT_AMBULATORY_CARE_PROVIDER_SITE_OTHER): Payer: Medicare Other | Admitting: Sports Medicine

## 2019-12-21 DIAGNOSIS — F4321 Adjustment disorder with depressed mood: Secondary | ICD-10-CM

## 2019-12-21 DIAGNOSIS — F5105 Insomnia due to other mental disorder: Secondary | ICD-10-CM

## 2019-12-21 DIAGNOSIS — I2510 Atherosclerotic heart disease of native coronary artery without angina pectoris: Secondary | ICD-10-CM | POA: Diagnosis not present

## 2019-12-21 MED ORDER — SERTRALINE HCL 50 MG PO TABS: 50.0000 mg | ORAL_TABLET | Freq: Every day | ORAL | 3 refills | Status: DC

## 2019-12-21 NOTE — Progress Notes (Signed)
    Procedures performed today:    None.  Independent interpretation of notes and tests performed by another provider:   None.  Brief History, Exam, Impression, and Recommendations:    Insomnia secondary to situational depression Clinton Ortiz returns, he had a lot going on, he is undergoing treatment for metastatic renal cell carcinoma, he was looking at a full dental extraction for gum disease, recent CABG, wife is sick and debilitated, he has his robotic excision of the perihepatic mass likely metastatic renal cell carcinoma coming up. At the last visit we placed him on Zoloft for his tearfulness, as well as situational depression and he returns today doing significantly better, happy, laughing the exam room.    ___________________________________________ Clinton Ortiz. Clinton Ortiz, M.D., ABFM., CAQSM. Primary Care and [place] Instructor of [place] of [facility] of Medicine

## 2019-12-21 NOTE — Assessment & Plan Note (Signed)
Clinton Ortiz returns, he had a lot going on, he is undergoing treatment for metastatic renal cell carcinoma, he was looking at a full dental extraction for gum disease, recent CABG, wife is sick and debilitated, he has his robotic excision of the perihepatic mass likely metastatic renal cell carcinoma coming up. At the last visit we placed him on Zoloft for his tearfulness, as well as situational depression and he returns today doing significantly better, happy, laughing the exam room.

## 2019-12-22 DIAGNOSIS — F419 Anxiety disorder, unspecified: Secondary | ICD-10-CM | POA: Diagnosis not present

## 2019-12-22 DIAGNOSIS — Z01818 Encounter for other preprocedural examination: Secondary | ICD-10-CM | POA: Diagnosis not present

## 2019-12-22 DIAGNOSIS — Z79899 Other long term (current) drug therapy: Secondary | ICD-10-CM | POA: Diagnosis not present

## 2019-12-22 DIAGNOSIS — C642 Malignant neoplasm of left kidney, except renal pelvis: Secondary | ICD-10-CM | POA: Diagnosis not present

## 2019-12-22 DIAGNOSIS — N400 Enlarged prostate without lower urinary tract symptoms: Secondary | ICD-10-CM | POA: Diagnosis not present

## 2019-12-22 DIAGNOSIS — C649 Malignant neoplasm of unspecified kidney, except renal pelvis: Secondary | ICD-10-CM | POA: Diagnosis not present

## 2019-12-22 DIAGNOSIS — I2510 Atherosclerotic heart disease of native coronary artery without angina pectoris: Secondary | ICD-10-CM | POA: Diagnosis not present

## 2019-12-22 DIAGNOSIS — I480 Paroxysmal atrial fibrillation: Secondary | ICD-10-CM | POA: Diagnosis not present

## 2019-12-22 DIAGNOSIS — J449 Chronic obstructive pulmonary disease, unspecified: Secondary | ICD-10-CM | POA: Diagnosis not present

## 2019-12-22 DIAGNOSIS — Z01812 Encounter for preprocedural laboratory examination: Secondary | ICD-10-CM | POA: Diagnosis not present

## 2019-12-22 DIAGNOSIS — N1832 Chronic kidney disease, stage 3b: Secondary | ICD-10-CM | POA: Diagnosis not present

## 2019-12-22 DIAGNOSIS — E119 Type 2 diabetes mellitus without complications: Secondary | ICD-10-CM | POA: Diagnosis not present

## 2019-12-22 DIAGNOSIS — Z01810 Encounter for preprocedural cardiovascular examination: Secondary | ICD-10-CM | POA: Diagnosis not present

## 2019-12-22 DIAGNOSIS — R1900 Intra-abdominal and pelvic swelling, mass and lump, unspecified site: Secondary | ICD-10-CM | POA: Diagnosis not present

## 2019-12-22 DIAGNOSIS — C109 Malignant neoplasm of oropharynx, unspecified: Secondary | ICD-10-CM | POA: Diagnosis not present

## 2019-12-22 DIAGNOSIS — E669 Obesity, unspecified: Secondary | ICD-10-CM | POA: Diagnosis not present

## 2019-12-26 ENCOUNTER — Other Ambulatory Visit: Payer: Self-pay | Admitting: Cardiothoracic Surgery

## 2019-12-28 ENCOUNTER — Other Ambulatory Visit: Payer: Self-pay | Admitting: Sports Medicine

## 2019-12-28 DIAGNOSIS — R079 Chest pain, unspecified: Secondary | ICD-10-CM

## 2019-12-29 DIAGNOSIS — D649 Anemia, unspecified: Secondary | ICD-10-CM | POA: Diagnosis not present

## 2019-12-29 DIAGNOSIS — C109 Malignant neoplasm of oropharynx, unspecified: Secondary | ICD-10-CM | POA: Diagnosis not present

## 2019-12-29 DIAGNOSIS — I10 Essential (primary) hypertension: Secondary | ICD-10-CM | POA: Diagnosis not present

## 2019-12-29 DIAGNOSIS — I25810 Atherosclerosis of coronary artery bypass graft(s) without angina pectoris: Secondary | ICD-10-CM | POA: Diagnosis not present

## 2019-12-29 DIAGNOSIS — E119 Type 2 diabetes mellitus without complications: Secondary | ICD-10-CM | POA: Diagnosis not present

## 2019-12-29 DIAGNOSIS — Z6837 Body mass index (BMI) 37.0-37.9, adult: Secondary | ICD-10-CM | POA: Diagnosis not present

## 2019-12-29 DIAGNOSIS — I129 Hypertensive chronic kidney disease with stage 1 through stage 4 chronic kidney disease, or unspecified chronic kidney disease: Secondary | ICD-10-CM | POA: Diagnosis not present

## 2019-12-29 DIAGNOSIS — E1122 Type 2 diabetes mellitus with diabetic chronic kidney disease: Secondary | ICD-10-CM | POA: Diagnosis not present

## 2019-12-29 DIAGNOSIS — Z951 Presence of aortocoronary bypass graft: Secondary | ICD-10-CM | POA: Diagnosis not present

## 2019-12-29 DIAGNOSIS — J449 Chronic obstructive pulmonary disease, unspecified: Secondary | ICD-10-CM | POA: Diagnosis not present

## 2019-12-29 DIAGNOSIS — C642 Malignant neoplasm of left kidney, except renal pelvis: Secondary | ICD-10-CM | POA: Diagnosis not present

## 2019-12-29 DIAGNOSIS — Z1379 Encounter for other screening for genetic and chromosomal anomalies: Secondary | ICD-10-CM | POA: Diagnosis not present

## 2019-12-29 DIAGNOSIS — N1832 Chronic kidney disease, stage 3b: Secondary | ICD-10-CM | POA: Diagnosis not present

## 2020-01-06 ENCOUNTER — Other Ambulatory Visit: Payer: Self-pay | Admitting: Sports Medicine

## 2020-01-06 DIAGNOSIS — F5101 Primary insomnia: Secondary | ICD-10-CM

## 2020-01-09 ENCOUNTER — Other Ambulatory Visit: Payer: Self-pay | Admitting: *Deleted

## 2020-01-09 DIAGNOSIS — M5136 Other intervertebral disc degeneration, lumbar region: Secondary | ICD-10-CM

## 2020-01-10 MED ORDER — HYDROCODONE-ACETAMINOPHEN 5-325 MG PO TABS: 1.0000 | ORAL_TABLET | Freq: Three times a day (TID) | ORAL | 0 refills | Status: DC | PRN

## 2020-01-10 MED ORDER — APIXABAN 5 MG PO TABS: 5.0000 mg | ORAL_TABLET | Freq: Two times a day (BID) | ORAL | 2 refills | Status: DC

## 2020-01-25 DIAGNOSIS — Z8249 Family history of ischemic heart disease and other diseases of the circulatory system: Secondary | ICD-10-CM | POA: Diagnosis not present

## 2020-01-25 DIAGNOSIS — I10 Essential (primary) hypertension: Secondary | ICD-10-CM | POA: Diagnosis not present

## 2020-01-25 DIAGNOSIS — Z1379 Encounter for other screening for genetic and chromosomal anomalies: Secondary | ICD-10-CM | POA: Diagnosis not present

## 2020-01-30 ENCOUNTER — Other Ambulatory Visit: Payer: Self-pay | Admitting: Sports Medicine

## 2020-01-30 DIAGNOSIS — I10 Essential (primary) hypertension: Secondary | ICD-10-CM

## 2020-01-30 DIAGNOSIS — R400 Somnolence: Secondary | ICD-10-CM | POA: Diagnosis not present

## 2020-01-30 DIAGNOSIS — G4730 Sleep apnea, unspecified: Secondary | ICD-10-CM | POA: Diagnosis not present

## 2020-01-30 DIAGNOSIS — R0683 Snoring: Secondary | ICD-10-CM | POA: Diagnosis not present

## 2020-01-30 NOTE — Progress Notes (Signed)
HPI: Follow-up coronary artery disease. Chest CT February 2021 showed 1.8 cm nodule anterior to the liver and omental metastatic disease should be considered. Ascending thoracic aorta measured 4 cm. There was also note of 3 mm left upper lobe nodule. Cardiac catheterization February 2021 showed 60-70% left main, 50-60% LAD, 60-70% circumflex. Patient underwent coronary artery bypass and graft March 2021 with LIMA to the LAD, saphenous vein graft to the diagonal, saphenous vein graft to the ramus intermediate and saphenous vein graft to the right coronary artery.Patient did develop postoperative atrial fibrillation treated with amiodarone. Abdominal CT March 2021 showed 1.8 cm soft tissue nodule in the anterior inferior perihepatic space and follow-up recommended.Had syncopal episode and seen at [facility] March 2021. Echocardiogram at that time showed normal LV function, mild left ventricular hypertrophy and small pericardial effusion. Developed recurrent atrial fibrillation and had cardioversion April 2021. Since last seenhe has some dyspnea on exertion but no orthopnea, PND, pedal edema, chest pain, palpitations or syncope.  He is scheduled to have resection of a renal cell metastatic lesion under his liver in 3 days.  Current Outpatient Medications  Medication Sig Dispense Refill  . Albuterol Sulfate (PROAIR RESPICLICK) 858 (90 BASE) MCG/ACT AEPB Inhale 2 puffs into the lungs every 4 (four) hours as needed. (Patient taking differently: Inhale 2 puffs into the lungs every 4 (four) hours as needed (shortness of breath). ) 1 each 0  . amiodarone (PACERONE) 200 MG tablet Take 1 tablet (200 mg total) by mouth daily. 30 tablet 3  . apixaban (ELIQUIS) 5 MG TABS tablet Take 1 tablet (5 mg total) by mouth 2 (two) times daily. 60 tablet 2  . Ascorbic Acid (VITAMIN C PO) Take by mouth.    Marland Kitchen aspirin EC 81 MG EC tablet Take 1 tablet (81 mg total) by mouth daily.    Marland Kitchen azelastine (ASTELIN) 0.1 %  nasal spray SPRAY 2 SPRAYS INTO EACH NOSTRIL TWICE A DAY AS DIRECTED (Patient taking differently: Place 2 sprays into both nostrils daily as needed for rhinitis or allergies. ) 30 mL 1  . Continuous Blood Gluc Receiver (FREESTYLE LIBRE 14 DAY READER) DEVI 1 application by Does not apply route every 14 (fourteen) days. Use with sensor system 1 each 0  . Continuous Blood Gluc Sensor (FREESTYLE LIBRE 14 DAY SENSOR) MISC 1 application by Does not apply route every 14 (fourteen) days. Apply upper deltoid every 14 days, use reader to determine blood sugars 2 each 11  . Dulaglutide (TRULICITY) 4.5 IF/0.2DX SOPN Inject 4.5 mg into the skin every Sunday. (Patient taking differently: Inject 4.5 mg into the skin every Monday. ) 4 pen 2  . gabapentin (NEURONTIN) 300 MG capsule TAKE 1 CAPSULE BY MOUTH IN THE MORNING, 2 CAPSULES AT BEDTIME (Patient taking differently: Take 300 mg by mouth 2 (two) times daily. ) 90 capsule 3  . HYDROcodone-acetaminophen (NORCO/VICODIN) 5-325 MG tablet Take 1 tablet by mouth every 8 (eight) hours as needed for moderate pain. 15 tablet 0  . metoCLOPramide (REGLAN) 10 MG tablet Take 10 mg by mouth daily as needed for nausea or vomiting.     . metoprolol succinate (TOPROL-XL) 25 MG 24 hr tablet TAKE 1 TABLET BY MOUTH EVERY DAY 90 tablet 1  . nitroGLYCERIN (NITROSTAT) 0.4 MG SL tablet Place 0.4 mg under the tongue every 5 (five) minutes as needed for chest pain.     Marland Kitchen ondansetron (ZOFRAN-ODT) 8 MG disintegrating tablet TAKE 1 TABLET BY MOUTH EVERY 8 HOURS AS  NEEDED FOR NAUSEA 20 tablet 11  . pantoprazole (PROTONIX) 40 MG tablet Take 1 tablet (40 mg total) by mouth daily. 90 tablet 3  . polyethylene glycol powder (GLYCOLAX/MIRALAX) 17 GM/SCOOP powder Take 0.5 Containers by mouth daily as needed for mild constipation or moderate constipation.     . pravastatin (PRAVACHOL) 80 MG tablet Take 1 tablet (80 mg total) by mouth daily. 90 tablet 3  . sertraline (ZOLOFT) 50 MG tablet Take 1 tablet  (50 mg total) by mouth daily. 90 tablet 3  . tamsulosin (FLOMAX) 0.4 MG CAPS capsule TAKE 2 CAPSULES BY MOUTH EVERY DAY 180 capsule 1  . temazepam (RESTORIL) 7.5 MG capsule TAKE 1 CAPSULE (7.5 MG TOTAL) BY MOUTH AT BEDTIME AS NEEDED FOR SLEEP. 30 capsule 3  . Thiamine HCl (VITAMIN B-1 PO) Take by mouth.    . TRELEGY ELLIPTA 100-62.5-25 MCG/INH AEPB INHALE 1 PUFF BY MOUTH EVERY DAY (Patient taking differently: Inhale 1 puff into the lungs daily. ) 60 each 11  . triamcinolone (NASACORT) 55 MCG/ACT AERO nasal inhaler PLACE 2 SPRAYS INTO THE NOSE DAILY. (Patient taking differently: Place 2 sprays into the nose daily as needed (congestion). ) 1 Inhaler 11  . VITAMIN D PO Take by mouth. Vitamin D3     No current facility-administered medications for this visit.     Past Medical History:  Diagnosis Date  . Arthritis   . CAD (coronary artery disease)    07/01/2019: LHC with LM disease. Needs CABG.  . Cancer [facility]) 2013   left kidney  . Complication of anesthesia    slow to wake up after kidney stone surgery  . Constipation, chronic   . COPD (chronic obstructive pulmonary disease) ([place])   . Diabetes mellitus ([place])   . Dyspnea    with exertion  . Family history of adverse reaction to anesthesia    daughter couldn't talk for 3-4 days after anesthesia   . Frequent urination   . GERD (gastroesophageal reflux disease)   . Headache   . Hernia   . History of kidney stones   . Hyperlipidemia   . Hypertension   . Peripheral vascular disease ([place])    right leg blood clot 20 plus years ago  . Pneumonia   . Renal insufficiency    stage 2 (only 1 kidney) had Kidney cancer  . Spondylolysis   . Tongue cancer ([place]) 2011    Past Surgical History:  Procedure Laterality Date  . CARDIOVERSION N/A 09/02/2019   Procedure: CARDIOVERSION;  Surgeon: Buford Dresser, MD;  Location: [place];  Service: Cardiovascular;  Laterality: N/A;  . CENTRAL VENOUS CATHETER INSERTION Right 07/12/2019    Procedure: Insertion Central Line Adult;  Surgeon: Ivin Poot, MD;  Location: [place];  Service: Open Heart Surgery;  Laterality: Right;  Right subclavian   . COLONOSCOPY    . CORONARY ARTERY BYPASS GRAFT N/A 07/12/2019   Procedure: CORONARY ARTERY BYPASS GRAFTING (CABG), ON PUMP, TIMES FOUR , USING LEFT INTERNAL MAMMARY ARTERY AND ENDOSCOPICALLY HARVESTED BILATERAL GREATER SAPHENOUS VEINS;  Surgeon: Ivin Poot, MD;  Location: [place];  Service: Open Heart Surgery;  Laterality: N/A;  . HERNIA REPAIR     abdominal  . KIDNEY STONE SURGERY    . LEFT HEART CATH AND CORONARY ANGIOGRAPHY N/A 07/01/2019   Procedure: LEFT HEART CATH AND CORONARY ANGIOGRAPHY;  Surgeon: Belva Crome, MD;  Location: [facility] CV LAB;  Service: Cardiovascular;  Laterality: N/A;  . NEPHRECTOMY Left 10/08/2011   s/p  robotic assisted lap left radical nephrectomy 10/08/11  . PARTIAL GLOSSECTOMY  12/07/2009   s/p partial glossectomy for SCC right tongue 12/07/09  . TEE WITHOUT CARDIOVERSION N/A 07/12/2019   Procedure: TRANSESOPHAGEAL ECHOCARDIOGRAM (TEE);  Surgeon: Prescott Gum, Collier Salina, MD;  Location: [place];  Service: Open Heart Surgery;  Laterality: N/A;    Social History   Socioeconomic History  . Marital status: Married    Spouse name: Not on file  . Number of children: 2  . Years of education: Not on file  . Highest education level: Not on file  Occupational History  . Not on file  Tobacco Use  . Smoking status: Former Smoker    Packs/day: 1.00    Years: 2.00    Pack years: 2.00    Types: Cigarettes  . Smokeless tobacco: Never Used  . Tobacco comment: smoked 3 years as a teenager  Substance and Sexual Activity  . Alcohol use: No  . Drug use: No  . Sexual activity: Never  Other Topics Concern  . Not on file  Social History Narrative  . Not on file   Social Determinants of Health   Financial Resource Strain:   . Difficulty of Paying Living Expenses: Not on file  Food Insecurity:   . Worried About  Charity fundraiser in the Last Year: Not on file  . Ran Out of Food in the Last Year: Not on file  Transportation Needs:   . Lack of Transportation (Medical): Not on file  . Lack of Transportation (Non-Medical): Not on file  Physical Activity:   . Days of Exercise per Week: Not on file  . Minutes of Exercise per Session: Not on file  Stress:   . Feeling of Stress : Not on file  Social Connections:   . Frequency of Communication with Friends and Family: Not on file  . Frequency of Social Gatherings with Friends and Family: Not on file  . Attends Religious Services: Not on file  . Active Member of Clubs or Organizations: Not on file  . Attends Archivist Meetings: Not on file  . Marital Status: Not on file  Intimate Partner Violence:   . Fear of Current or Ex-Partner: Not on file  . Emotionally Abused: Not on file  . Physically Abused: Not on file  . Sexually Abused: Not on file    Family History  Problem Relation Age of Onset  . Cancer Mother        bladder  . Heart attack Father     ROS: no fevers or chills, productive cough, hemoptysis, dysphasia, odynophagia, melena, hematochezia, dysuria, hematuria, rash, seizure activity, orthopnea, PND, pedal edema, claudication. Remaining systems are negative.  Physical Exam: Well-developed well-nourished in no acute distress.  Skin is warm and dry.  HEENT is normal.  Neck is supple.  Chest is clear to auscultation with normal expansion.  Cardiovascular exam is regular rate and rhythm.  Abdominal exam nontender or distended. No masses palpated. Extremities show no edema. neuro grossly intact  ECG-sinus rhythm at a rate of 62, first-degree AV block, RV conduction delay.  Personally reviewed  A/P  1 coronary artery disease status post coronary artery bypass graft-plan to continue medical therapy; continue statin.  He is not on aspirin given need for apixaban.  2 hypertension-patient's blood pressure is controlled.  His  orthostatic symptoms have improved with changing Lasix to as needed.  Continue to follow.  3 hyperlipidemia-continue statin.  4 postoperative atrial fibrillation-patient remains in sinus  rhythm.  We will plan to continue amiodarone for now.  He is scheduled to have resection of renal cell metastatic lesion in the near future.  He will be at increased risk for postoperative atrial fibrillation.  Once he completes the surgery I will see him back 3 months later and if sinus rhythm is maintained we will discontinue amiodarone at that time. Continue apixaban.  Note his previous atrial fibrillation occurred 1 month after CABG and I am therefore hesitant to discontinue anticoagulation.  Hold apixaban 3 days prior to upcoming surgery and resume after when okay with general surgery.  5 history of dilated aortic root-plan follow-up noncontrast chest CT February 2022.  6 lung nodule-follow-up noncontrast chest CT February 2022.  7 chronic stage III kidney disease-followed by nephrology.  8 mass in anterior perihepatic space-scheduled for surgery in the near future to remove metastatic renal lesion.  Kirk Ruths, MD

## 2020-01-31 ENCOUNTER — Other Ambulatory Visit: Payer: Self-pay

## 2020-01-31 MED ORDER — AMIODARONE HCL 200 MG PO TABS: 200.0000 mg | ORAL_TABLET | Freq: Every day | ORAL | 3 refills | Status: DC

## 2020-02-06 DIAGNOSIS — F419 Anxiety disorder, unspecified: Secondary | ICD-10-CM | POA: Diagnosis not present

## 2020-02-06 DIAGNOSIS — Z01818 Encounter for other preprocedural examination: Secondary | ICD-10-CM | POA: Diagnosis not present

## 2020-02-06 DIAGNOSIS — C642 Malignant neoplasm of left kidney, except renal pelvis: Secondary | ICD-10-CM | POA: Diagnosis not present

## 2020-02-06 DIAGNOSIS — E119 Type 2 diabetes mellitus without complications: Secondary | ICD-10-CM | POA: Diagnosis not present

## 2020-02-06 DIAGNOSIS — N1832 Chronic kidney disease, stage 3b: Secondary | ICD-10-CM | POA: Diagnosis not present

## 2020-02-06 DIAGNOSIS — Z79899 Other long term (current) drug therapy: Secondary | ICD-10-CM | POA: Diagnosis not present

## 2020-02-06 DIAGNOSIS — I25810 Atherosclerosis of coronary artery bypass graft(s) without angina pectoris: Secondary | ICD-10-CM | POA: Diagnosis not present

## 2020-02-06 DIAGNOSIS — N400 Enlarged prostate without lower urinary tract symptoms: Secondary | ICD-10-CM | POA: Diagnosis not present

## 2020-02-06 DIAGNOSIS — C109 Malignant neoplasm of oropharynx, unspecified: Secondary | ICD-10-CM | POA: Diagnosis not present

## 2020-02-06 DIAGNOSIS — J449 Chronic obstructive pulmonary disease, unspecified: Secondary | ICD-10-CM | POA: Diagnosis not present

## 2020-02-06 DIAGNOSIS — Z01812 Encounter for preprocedural laboratory examination: Secondary | ICD-10-CM | POA: Diagnosis not present

## 2020-02-06 DIAGNOSIS — I480 Paroxysmal atrial fibrillation: Secondary | ICD-10-CM | POA: Diagnosis not present

## 2020-02-07 DIAGNOSIS — Z23 Encounter for immunization: Secondary | ICD-10-CM | POA: Diagnosis not present

## 2020-02-08 ENCOUNTER — Encounter: Payer: Self-pay | Admitting: Cardiology

## 2020-02-08 ENCOUNTER — Other Ambulatory Visit: Payer: Self-pay

## 2020-02-08 ENCOUNTER — Ambulatory Visit (INDEPENDENT_AMBULATORY_CARE_PROVIDER_SITE_OTHER): Payer: Medicare Other | Admitting: Cardiology

## 2020-02-08 VITALS — BP 133/78 | HR 66 | Ht 67.0 in | Wt 243.8 lb

## 2020-02-08 DIAGNOSIS — I712 Thoracic aortic aneurysm, without rupture, unspecified: Secondary | ICD-10-CM

## 2020-02-08 DIAGNOSIS — E7800 Pure hypercholesterolemia, unspecified: Secondary | ICD-10-CM | POA: Diagnosis not present

## 2020-02-08 DIAGNOSIS — I480 Paroxysmal atrial fibrillation: Secondary | ICD-10-CM | POA: Diagnosis not present

## 2020-02-08 DIAGNOSIS — I10 Essential (primary) hypertension: Secondary | ICD-10-CM

## 2020-02-08 DIAGNOSIS — I2510 Atherosclerotic heart disease of native coronary artery without angina pectoris: Secondary | ICD-10-CM | POA: Diagnosis not present

## 2020-02-08 NOTE — Patient Instructions (Signed)
  Follow-Up: At [facility], you and your health needs are our priority.  As part of our continuing mission to provide you with exceptional heart care, we have created designated Provider Care Teams.  These Care Teams include your primary Cardiologist (physician) and Advanced Practice Providers (APPs -  Physician Assistants and Nurse Practitioners) who all work together to provide you with the care you need, when you need it.  We recommend signing up for the patient portal called "MyChart".  Sign up information is provided on this After Visit Summary.  MyChart is used to connect with patients for Virtual Visits (Telemedicine).  Patients are able to view lab/test results, encounter notes, upcoming appointments, etc.  Non-urgent messages can be sent to your provider as well.   To learn more about what you can do with MyChart, go to [email or web address].    Your next appointment:   3 month(s)  The format for your next appointment:   In Person  Provider:   Brian Crenshaw, MD    

## 2020-02-09 DIAGNOSIS — R400 Somnolence: Secondary | ICD-10-CM | POA: Diagnosis not present

## 2020-02-09 DIAGNOSIS — G4730 Sleep apnea, unspecified: Secondary | ICD-10-CM | POA: Diagnosis not present

## 2020-02-09 DIAGNOSIS — R0683 Snoring: Secondary | ICD-10-CM | POA: Diagnosis not present

## 2020-02-16 DIAGNOSIS — R1901 Right upper quadrant abdominal swelling, mass and lump: Secondary | ICD-10-CM | POA: Diagnosis not present

## 2020-02-16 DIAGNOSIS — C787 Secondary malignant neoplasm of liver and intrahepatic bile duct: Secondary | ICD-10-CM | POA: Diagnosis not present

## 2020-02-16 DIAGNOSIS — C649 Malignant neoplasm of unspecified kidney, except renal pelvis: Secondary | ICD-10-CM | POA: Diagnosis not present

## 2020-02-16 DIAGNOSIS — Z01812 Encounter for preprocedural laboratory examination: Secondary | ICD-10-CM | POA: Diagnosis not present

## 2020-02-17 DIAGNOSIS — Z7951 Long term (current) use of inhaled steroids: Secondary | ICD-10-CM | POA: Diagnosis not present

## 2020-02-17 DIAGNOSIS — E1122 Type 2 diabetes mellitus with diabetic chronic kidney disease: Secondary | ICD-10-CM | POA: Diagnosis not present

## 2020-02-17 DIAGNOSIS — I10 Essential (primary) hypertension: Secondary | ICD-10-CM | POA: Diagnosis not present

## 2020-02-17 DIAGNOSIS — I480 Paroxysmal atrial fibrillation: Secondary | ICD-10-CM | POA: Diagnosis not present

## 2020-02-17 DIAGNOSIS — Z880 Allergy status to penicillin: Secondary | ICD-10-CM | POA: Diagnosis not present

## 2020-02-17 DIAGNOSIS — Z87442 Personal history of urinary calculi: Secondary | ICD-10-CM | POA: Diagnosis not present

## 2020-02-17 DIAGNOSIS — Z905 Acquired absence of kidney: Secondary | ICD-10-CM | POA: Diagnosis not present

## 2020-02-17 DIAGNOSIS — N400 Enlarged prostate without lower urinary tract symptoms: Secondary | ICD-10-CM | POA: Diagnosis not present

## 2020-02-17 DIAGNOSIS — Z7901 Long term (current) use of anticoagulants: Secondary | ICD-10-CM | POA: Diagnosis not present

## 2020-02-17 DIAGNOSIS — Z87891 Personal history of nicotine dependence: Secondary | ICD-10-CM | POA: Diagnosis not present

## 2020-02-17 DIAGNOSIS — Z8049 Family history of malignant neoplasm of other genital organs: Secondary | ICD-10-CM | POA: Diagnosis not present

## 2020-02-17 DIAGNOSIS — C641 Malignant neoplasm of right kidney, except renal pelvis: Secondary | ICD-10-CM | POA: Diagnosis not present

## 2020-02-17 DIAGNOSIS — C786 Secondary malignant neoplasm of retroperitoneum and peritoneum: Secondary | ICD-10-CM | POA: Diagnosis not present

## 2020-02-17 DIAGNOSIS — C7989 Secondary malignant neoplasm of other specified sites: Secondary | ICD-10-CM | POA: Diagnosis not present

## 2020-02-17 DIAGNOSIS — R1901 Right upper quadrant abdominal swelling, mass and lump: Secondary | ICD-10-CM | POA: Diagnosis not present

## 2020-02-17 DIAGNOSIS — E119 Type 2 diabetes mellitus without complications: Secondary | ICD-10-CM | POA: Diagnosis not present

## 2020-02-17 DIAGNOSIS — Z7982 Long term (current) use of aspirin: Secondary | ICD-10-CM | POA: Diagnosis not present

## 2020-02-17 DIAGNOSIS — Z6838 Body mass index (BMI) 38.0-38.9, adult: Secondary | ICD-10-CM | POA: Diagnosis not present

## 2020-02-17 DIAGNOSIS — C642 Malignant neoplasm of left kidney, except renal pelvis: Secondary | ICD-10-CM | POA: Diagnosis not present

## 2020-02-17 DIAGNOSIS — D490 Neoplasm of unspecified behavior of digestive system: Secondary | ICD-10-CM | POA: Diagnosis not present

## 2020-02-17 DIAGNOSIS — E669 Obesity, unspecified: Secondary | ICD-10-CM | POA: Diagnosis not present

## 2020-02-17 DIAGNOSIS — N1832 Chronic kidney disease, stage 3b: Secondary | ICD-10-CM | POA: Diagnosis not present

## 2020-02-17 DIAGNOSIS — C787 Secondary malignant neoplasm of liver and intrahepatic bile duct: Secondary | ICD-10-CM | POA: Diagnosis not present

## 2020-02-17 DIAGNOSIS — I2510 Atherosclerotic heart disease of native coronary artery without angina pectoris: Secondary | ICD-10-CM | POA: Diagnosis not present

## 2020-02-17 DIAGNOSIS — Z86010 Personal history of colonic polyps: Secondary | ICD-10-CM | POA: Diagnosis not present

## 2020-02-17 DIAGNOSIS — I129 Hypertensive chronic kidney disease with stage 1 through stage 4 chronic kidney disease, or unspecified chronic kidney disease: Secondary | ICD-10-CM | POA: Diagnosis not present

## 2020-02-17 DIAGNOSIS — J449 Chronic obstructive pulmonary disease, unspecified: Secondary | ICD-10-CM | POA: Diagnosis not present

## 2020-02-17 DIAGNOSIS — Z888 Allergy status to other drugs, medicaments and biological substances status: Secondary | ICD-10-CM | POA: Diagnosis not present

## 2020-02-17 DIAGNOSIS — Z79899 Other long term (current) drug therapy: Secondary | ICD-10-CM | POA: Diagnosis not present

## 2020-02-17 DIAGNOSIS — Z955 Presence of coronary angioplasty implant and graft: Secondary | ICD-10-CM | POA: Diagnosis not present

## 2020-02-17 DIAGNOSIS — E785 Hyperlipidemia, unspecified: Secondary | ICD-10-CM | POA: Diagnosis not present

## 2020-02-18 DIAGNOSIS — E1122 Type 2 diabetes mellitus with diabetic chronic kidney disease: Secondary | ICD-10-CM | POA: Diagnosis not present

## 2020-02-18 DIAGNOSIS — I480 Paroxysmal atrial fibrillation: Secondary | ICD-10-CM | POA: Diagnosis not present

## 2020-02-18 DIAGNOSIS — C642 Malignant neoplasm of left kidney, except renal pelvis: Secondary | ICD-10-CM | POA: Diagnosis not present

## 2020-02-18 DIAGNOSIS — I129 Hypertensive chronic kidney disease with stage 1 through stage 4 chronic kidney disease, or unspecified chronic kidney disease: Secondary | ICD-10-CM | POA: Diagnosis not present

## 2020-02-18 DIAGNOSIS — N1832 Chronic kidney disease, stage 3b: Secondary | ICD-10-CM | POA: Diagnosis not present

## 2020-02-18 DIAGNOSIS — C786 Secondary malignant neoplasm of retroperitoneum and peritoneum: Secondary | ICD-10-CM | POA: Diagnosis not present

## 2020-02-28 DIAGNOSIS — C649 Malignant neoplasm of unspecified kidney, except renal pelvis: Secondary | ICD-10-CM | POA: Diagnosis not present

## 2020-02-28 DIAGNOSIS — C787 Secondary malignant neoplasm of liver and intrahepatic bile duct: Secondary | ICD-10-CM | POA: Diagnosis not present

## 2020-03-01 DIAGNOSIS — Z1379 Encounter for other screening for genetic and chromosomal anomalies: Secondary | ICD-10-CM | POA: Diagnosis not present

## 2020-03-01 DIAGNOSIS — I25810 Atherosclerosis of coronary artery bypass graft(s) without angina pectoris: Secondary | ICD-10-CM | POA: Diagnosis not present

## 2020-03-01 DIAGNOSIS — C109 Malignant neoplasm of oropharynx, unspecified: Secondary | ICD-10-CM | POA: Diagnosis not present

## 2020-03-01 DIAGNOSIS — Z87891 Personal history of nicotine dependence: Secondary | ICD-10-CM | POA: Diagnosis not present

## 2020-03-01 DIAGNOSIS — C642 Malignant neoplasm of left kidney, except renal pelvis: Secondary | ICD-10-CM | POA: Diagnosis not present

## 2020-03-01 DIAGNOSIS — D649 Anemia, unspecified: Secondary | ICD-10-CM | POA: Diagnosis not present

## 2020-03-01 DIAGNOSIS — R1011 Right upper quadrant pain: Secondary | ICD-10-CM | POA: Diagnosis not present

## 2020-03-01 DIAGNOSIS — E119 Type 2 diabetes mellitus without complications: Secondary | ICD-10-CM | POA: Diagnosis not present

## 2020-03-01 DIAGNOSIS — N1832 Chronic kidney disease, stage 3b: Secondary | ICD-10-CM | POA: Diagnosis not present

## 2020-03-01 DIAGNOSIS — I10 Essential (primary) hypertension: Secondary | ICD-10-CM | POA: Diagnosis not present

## 2020-03-01 DIAGNOSIS — C787 Secondary malignant neoplasm of liver and intrahepatic bile duct: Secondary | ICD-10-CM | POA: Diagnosis not present

## 2020-03-01 DIAGNOSIS — D630 Anemia in neoplastic disease: Secondary | ICD-10-CM | POA: Diagnosis not present

## 2020-03-01 DIAGNOSIS — J449 Chronic obstructive pulmonary disease, unspecified: Secondary | ICD-10-CM | POA: Diagnosis not present

## 2020-03-01 DIAGNOSIS — R1901 Right upper quadrant abdominal swelling, mass and lump: Secondary | ICD-10-CM | POA: Diagnosis not present

## 2020-03-01 DIAGNOSIS — I2510 Atherosclerotic heart disease of native coronary artery without angina pectoris: Secondary | ICD-10-CM | POA: Diagnosis not present

## 2020-03-01 DIAGNOSIS — I129 Hypertensive chronic kidney disease with stage 1 through stage 4 chronic kidney disease, or unspecified chronic kidney disease: Secondary | ICD-10-CM | POA: Diagnosis not present

## 2020-03-01 DIAGNOSIS — Z6837 Body mass index (BMI) 37.0-37.9, adult: Secondary | ICD-10-CM | POA: Diagnosis not present

## 2020-03-01 DIAGNOSIS — E1122 Type 2 diabetes mellitus with diabetic chronic kidney disease: Secondary | ICD-10-CM | POA: Diagnosis not present

## 2020-03-01 DIAGNOSIS — Z85818 Personal history of malignant neoplasm of other sites of lip, oral cavity, and pharynx: Secondary | ICD-10-CM | POA: Diagnosis not present

## 2020-03-05 DIAGNOSIS — N1832 Chronic kidney disease, stage 3b: Secondary | ICD-10-CM | POA: Diagnosis not present

## 2020-03-18 ENCOUNTER — Other Ambulatory Visit: Payer: Self-pay | Admitting: Sports Medicine

## 2020-03-18 DIAGNOSIS — E1121 Type 2 diabetes mellitus with diabetic nephropathy: Secondary | ICD-10-CM

## 2020-04-06 ENCOUNTER — Other Ambulatory Visit: Payer: Self-pay | Admitting: Sports Medicine

## 2020-04-06 DIAGNOSIS — I10 Essential (primary) hypertension: Secondary | ICD-10-CM

## 2020-04-10 ENCOUNTER — Other Ambulatory Visit: Payer: Self-pay

## 2020-04-10 DIAGNOSIS — M5136 Other intervertebral disc degeneration, lumbar region: Secondary | ICD-10-CM

## 2020-04-10 MED ORDER — HYDROCODONE-ACETAMINOPHEN 5-325 MG PO TABS: 1.0000 | ORAL_TABLET | Freq: Three times a day (TID) | ORAL | 0 refills | Status: DC | PRN

## 2020-04-10 NOTE — Telephone Encounter (Signed)
Refill request for Hydrocodone.  

## 2020-04-26 ENCOUNTER — Other Ambulatory Visit: Payer: Self-pay | Admitting: Sports Medicine

## 2020-04-26 DIAGNOSIS — R079 Chest pain, unspecified: Secondary | ICD-10-CM

## 2020-04-29 IMAGING — CR DG CHEST 2V
2 series · 2 of 2 positions shown · non-contrast
Comparison: July 17, 2019.

CLINICAL DATA: Status post coronary bypass graft.

EXAM:
CHEST - 2 VIEW

[w chest pa]
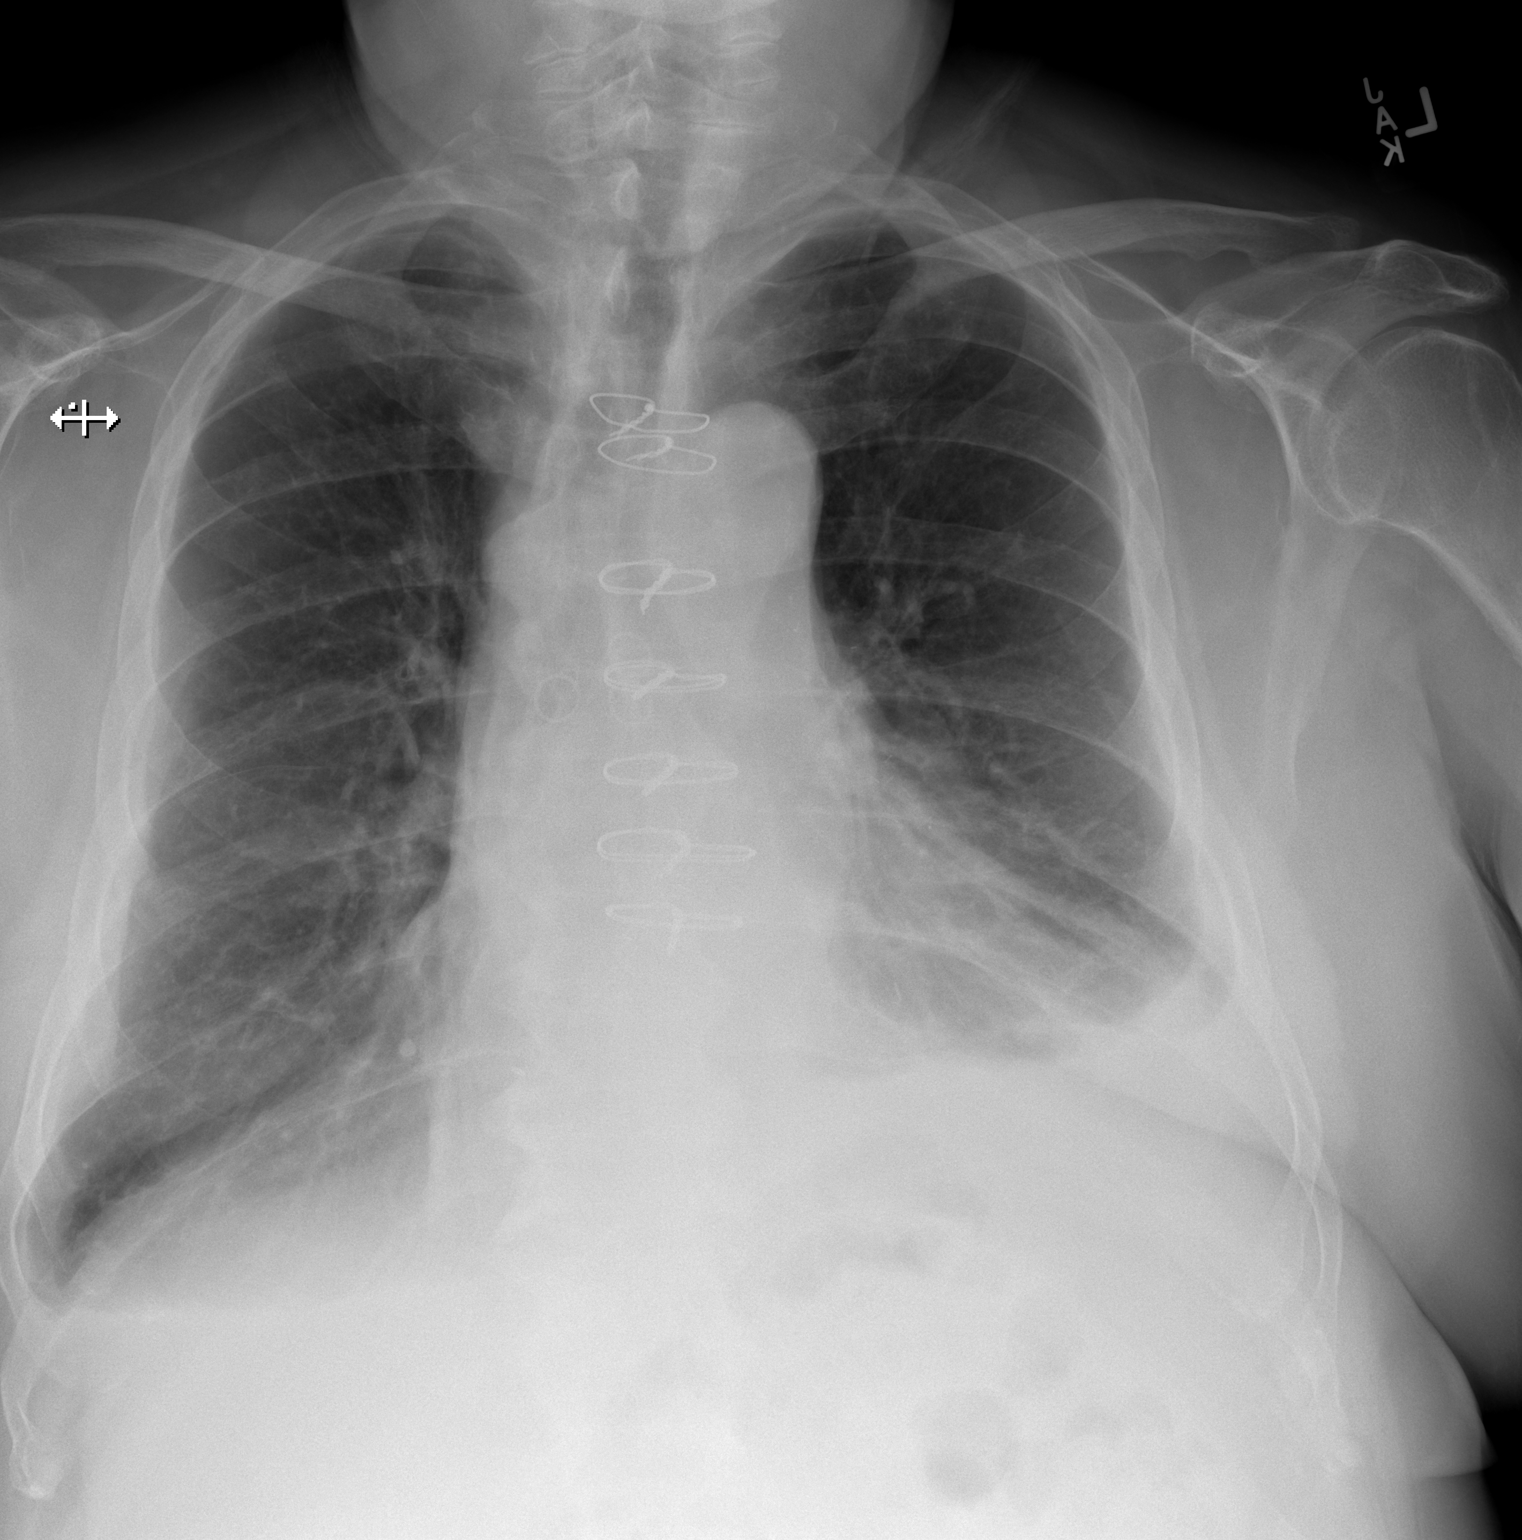

[w chest lat]
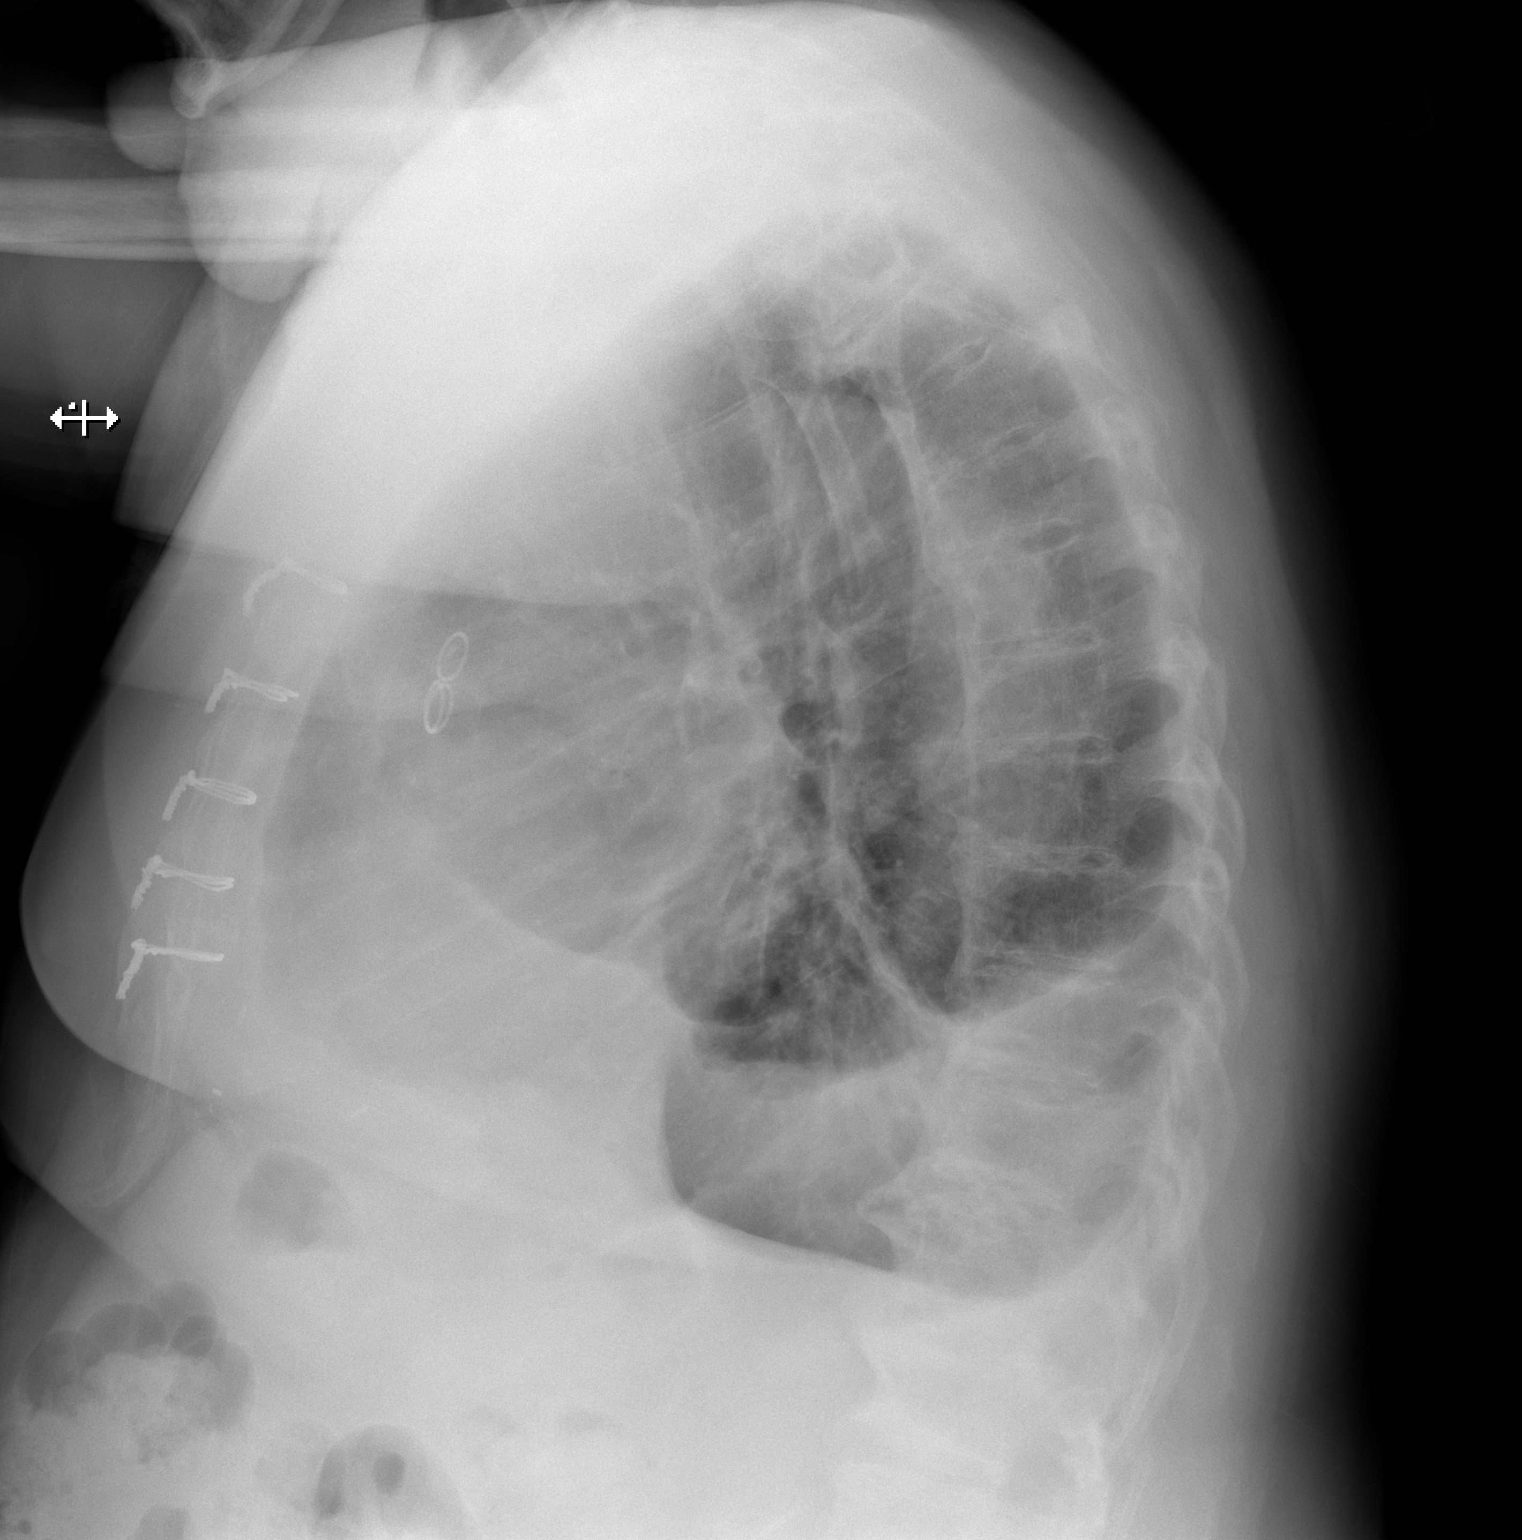

[2 of 2 positions shown; findings below may reference images not displayed]

FINDINGS: Stable cardiomegaly. Sternotomy wires are noted. No pneumothorax is
noted. Bilateral pleural effusions are noted, left greater than
right, with associated subsegmental atelectasis. Bony thorax is
unremarkable.
IMPRESSION: Bilateral pleural effusions are noted, left greater than right, with
associated subsegmental atelectasis.

## 2020-05-10 NOTE — Progress Notes (Deleted)
HPI: Follow-up coronary artery disease. Chest CT February 2021 showed 1.8 cm nodule anterior to the liver and omental metastatic disease should be considered. Ascending thoracic aorta measured 4 cm. There was also note of 3 mm left upper lobe nodule. Cardiac catheterization February 2021 showed 60-70% left main, 50-60% LAD, 60-70% circumflex. Patient underwent coronary artery bypass and graft March 2021 with LIMA to the LAD, saphenous vein graft to the diagonal, saphenous vein graft to the ramus intermediate and saphenous vein graft to the right coronary artery.Patient did develop postoperative atrial fibrillation treated with amiodarone. Abdominal CT March 2021 showed 1.8 cm soft tissue nodule in the anterior inferior perihepatic space and follow-up recommended.Had syncopal episode and seen at [facility] March 2021. Echocardiogram at that time showed normal LV function, mild left ventricular hypertrophy and small pericardial effusion. Developed recurrent atrial fibrillation and had cardioversion April 2021. Since last seen  Current Outpatient Medications  Medication Sig Dispense Refill  . Albuterol Sulfate (PROAIR RESPICLICK) 034 (90 BASE) MCG/ACT AEPB Inhale 2 puffs into the lungs every 4 (four) hours as needed. (Patient taking differently: Inhale 2 puffs into the lungs every 4 (four) hours as needed (shortness of breath). ) 1 each 0  . amiodarone (PACERONE) 200 MG tablet Take 1 tablet (200 mg total) by mouth daily. 30 tablet 3  . Ascorbic Acid (VITAMIN C PO) Take by mouth.    Marland Kitchen aspirin EC 81 MG EC tablet Take 1 tablet (81 mg total) by mouth daily.    Marland Kitchen azelastine (ASTELIN) 0.1 % nasal spray SPRAY 2 SPRAYS INTO EACH NOSTRIL TWICE A DAY AS DIRECTED (Patient taking differently: Place 2 sprays into both nostrils daily as needed for rhinitis or allergies. ) 30 mL 1  . Continuous Blood Gluc Receiver (FREESTYLE LIBRE 14 DAY READER) DEVI 1 application by Does not apply route every 14  (fourteen) days. Use with sensor system 1 each 0  . Continuous Blood Gluc Sensor (FREESTYLE LIBRE 14 DAY SENSOR) MISC 1 application by Does not apply route every 14 (fourteen) days. Apply upper deltoid every 14 days, use reader to determine blood sugars 2 each 11  . Dulaglutide (TRULICITY) 4.5 VQ/2.5ZD SOPN Inject 4.5 mg into the skin every Sunday. (Patient taking differently: Inject 4.5 mg into the skin every Monday. ) 4 pen 2  . ELIQUIS 5 MG TABS tablet TAKE 1 TABLET BY MOUTH TWICE A DAY 60 tablet 2  . furosemide (LASIX) 40 MG tablet TAKE 1 TABLET BY MOUTH EVERY DAY 90 tablet 3  . gabapentin (NEURONTIN) 300 MG capsule Take 1 capsule (300 mg total) by mouth 2 (two) times daily. 90 capsule 3  . HYDROcodone-acetaminophen (NORCO/VICODIN) 5-325 MG tablet Take 1 tablet by mouth every 8 (eight) hours as needed for moderate pain. 15 tablet 0  . metoCLOPramide (REGLAN) 10 MG tablet Take 10 mg by mouth daily as needed for nausea or vomiting.     . metoprolol succinate (TOPROL-XL) 25 MG 24 hr tablet TAKE 1 TABLET BY MOUTH EVERY DAY 90 tablet 1  . metoprolol tartrate (LOPRESSOR) 25 MG tablet TAKE 1 TABLET BY MOUTH TWICE A DAY 60 tablet 1  . nitroGLYCERIN (NITROSTAT) 0.4 MG SL tablet Place 0.4 mg under the tongue every 5 (five) minutes as needed for chest pain.     Marland Kitchen ondansetron (ZOFRAN-ODT) 8 MG disintegrating tablet TAKE 1 TABLET BY MOUTH EVERY 8 HOURS AS NEEDED FOR NAUSEA 20 tablet 11  . pantoprazole (PROTONIX) 40 MG tablet Take 1 tablet (40  mg total) by mouth daily. 90 tablet 3  . polyethylene glycol powder (GLYCOLAX/MIRALAX) 17 GM/SCOOP powder Take 0.5 Containers by mouth daily as needed for mild constipation or moderate constipation.     . pravastatin (PRAVACHOL) 80 MG tablet Take 1 tablet (80 mg total) by mouth daily. 90 tablet 3  . sertraline (ZOLOFT) 50 MG tablet Take 1 tablet (50 mg total) by mouth daily. 90 tablet 3  . tamsulosin (FLOMAX) 0.4 MG CAPS capsule TAKE 2 CAPSULES BY MOUTH EVERY DAY 180  capsule 1  . temazepam (RESTORIL) 7.5 MG capsule TAKE 1 CAPSULE (7.5 MG TOTAL) BY MOUTH AT BEDTIME AS NEEDED FOR SLEEP. 30 capsule 3  . Thiamine HCl (VITAMIN B-1 PO) Take by mouth.    . TRELEGY ELLIPTA 100-62.5-25 MCG/INH AEPB INHALE 1 PUFF BY MOUTH EVERY DAY (Patient taking differently: Inhale 1 puff into the lungs daily. ) 60 each 11  . triamcinolone (NASACORT) 55 MCG/ACT AERO nasal inhaler PLACE 2 SPRAYS INTO THE NOSE DAILY. (Patient taking differently: Place 2 sprays into the nose daily as needed (congestion). ) 1 Inhaler 11  . VITAMIN D PO Take by mouth. Vitamin D3     No current facility-administered medications for this visit.     Past Medical History:  Diagnosis Date  . Arthritis   . CAD (coronary artery disease)    07/01/2019: LHC with LM disease. Needs CABG.  . Cancer [facility]) 2013   left kidney  . Complication of anesthesia    slow to wake up after kidney stone surgery  . Constipation, chronic   . COPD (chronic obstructive pulmonary disease) ([place])   . Diabetes mellitus ([place])   . Dyspnea    with exertion  . Family history of adverse reaction to anesthesia    daughter couldn't talk for 3-4 days after anesthesia   . Frequent urination   . GERD (gastroesophageal reflux disease)   . Headache   . Hernia   . History of kidney stones   . Hyperlipidemia   . Hypertension   . Peripheral vascular disease ([place])    right leg blood clot 20 plus years ago  . Pneumonia   . Renal insufficiency    stage 2 (only 1 kidney) had Kidney cancer  . Spondylolysis   . Tongue cancer ([place]) 2011    Past Surgical History:  Procedure Laterality Date  . CARDIOVERSION N/A 09/02/2019   Procedure: CARDIOVERSION;  Surgeon: Buford Dresser, MD;  Location: [place];  Service: Cardiovascular;  Laterality: N/A;  . CENTRAL VENOUS CATHETER INSERTION Right 07/12/2019   Procedure: Insertion Central Line Adult;  Surgeon: Ivin Poot, MD;  Location: [place];  Service: Open Heart Surgery;   Laterality: Right;  Right subclavian   . COLONOSCOPY    . CORONARY ARTERY BYPASS GRAFT N/A 07/12/2019   Procedure: CORONARY ARTERY BYPASS GRAFTING (CABG), ON PUMP, TIMES FOUR , USING LEFT INTERNAL MAMMARY ARTERY AND ENDOSCOPICALLY HARVESTED BILATERAL GREATER SAPHENOUS VEINS;  Surgeon: Ivin Poot, MD;  Location: [place];  Service: Open Heart Surgery;  Laterality: N/A;  . HERNIA REPAIR     abdominal  . KIDNEY STONE SURGERY    . LEFT HEART CATH AND CORONARY ANGIOGRAPHY N/A 07/01/2019   Procedure: LEFT HEART CATH AND CORONARY ANGIOGRAPHY;  Surgeon: Belva Crome, MD;  Location: [place] CV LAB;  Service: Cardiovascular;  Laterality: N/A;  . NEPHRECTOMY Left 10/08/2011   s/p robotic assisted lap left radical nephrectomy 10/08/11  . PARTIAL GLOSSECTOMY  12/07/2009   s/p partial  glossectomy for SCC right tongue 12/07/09  . TEE WITHOUT CARDIOVERSION N/A 07/12/2019   Procedure: TRANSESOPHAGEAL ECHOCARDIOGRAM (TEE);  Surgeon: Prescott Gum, Collier [place], MD;  Location: [place];  Service: Open Heart Surgery;  Laterality: N/A;    Social History   Socioeconomic History  . Marital status: Married    Spouse name: Not on file  . Number of children: 2  . Years of education: Not on file  . Highest education level: Not on file  Occupational History  . Not on file  Tobacco Use  . Smoking status: Former Smoker    Packs/day: 1.00    Years: 2.00    Pack years: 2.00    Types: Cigarettes  . Smokeless tobacco: Never Used  . Tobacco comment: smoked 3 years as a teenager  Substance and Sexual Activity  . Alcohol use: No  . Drug use: No  . Sexual activity: Never  Other Topics Concern  . Not on file  Social History Narrative  . Not on file   Social Determinants of Health   Financial Resource Strain: Not on file  Food Insecurity: Not on file  Transportation Needs: Not on file  Physical Activity: Not on file  Stress: Not on file  Social Connections: Not on file  Intimate Partner Violence: Not on file     Family History  Problem Relation Age of Onset  . Cancer Mother        bladder  . Heart attack Father     ROS: no fevers or chills, productive cough, hemoptysis, dysphasia, odynophagia, melena, hematochezia, dysuria, hematuria, rash, seizure activity, orthopnea, PND, pedal edema, claudication. Remaining systems are negative.  Physical Exam: Well-developed well-nourished in no acute distress.  Skin is warm and dry.  HEENT is normal.  Neck is supple.  Chest is clear to auscultation with normal expansion.  Cardiovascular exam is regular rate and rhythm.  Abdominal exam nontender or distended. No masses palpated. Extremities show no edema. neuro grossly intact  ECG- personally reviewed  A/P  1 coronary artery disease status post coronary artery bypass and graft-patient denies chest pain.  Continue statin.  No aspirin given need for apixaban.  2 history of atrial fibrillation-this occurred in the postoperative setting.  Patient remains in sinus rhythm.  I will therefore discontinue amiodarone and follow.  Note previous atrial fibrillation occurred 1 month after coronary artery bypass and graft and therefore I am hesitant to discontinue anticoagulation.  Continue apixaban.  3 hypertension-patient's blood pressure is controlled.  Continue present medical regimen.  4 hyperlipidemia-continue statin.  5 dilated aortic root-we will arrange noncontrast chest CT to follow-up February 2022.  6 lung nodule-follow-up chest CT February 2022.  7 chronic stage III kidney disease-Per nephrology.  Kirk Ruths, MD

## 2020-05-16 ENCOUNTER — Ambulatory Visit: Payer: Medicare Other | Admitting: Cardiology

## 2020-05-17 DIAGNOSIS — Z9989 Dependence on other enabling machines and devices: Secondary | ICD-10-CM | POA: Diagnosis not present

## 2020-05-17 DIAGNOSIS — G4733 Obstructive sleep apnea (adult) (pediatric): Secondary | ICD-10-CM | POA: Diagnosis not present

## 2020-05-17 NOTE — Progress Notes (Signed)
HPI: Follow-up coronary artery disease. Cardiac catheterization February 2021 showed 60-70% left main, 50-60% LAD, 60-70% circumflex. Patient underwent coronary artery bypass and graft March 2021 with LIMA to the LAD, saphenous vein graft to the diagonal, saphenous vein graft to the ramus intermediate and saphenous vein graft to the right coronary artery.Patient did develop postoperative atrial fibrillation treated with amiodarone. Abdominal CT March 2021 showed 1.8 cm soft tissue nodule in the anterior inferior perihepatic space and follow-up recommended.Had syncopal episode and seen at [facility] March [facility]. Echocardiogram at that time showed normal LV function, mild left ventricular hypertrophy and small pericardial effusion. Developed recurrent atrial fibrillation and had cardioversion April 2021. Since last seenthere is no dyspnea, chest pain, palpitations or syncope.  Current Outpatient Medications  Medication Sig Dispense Refill  . Albuterol Sulfate (PROAIR RESPICLICK) 400 (90 BASE) MCG/ACT AEPB Inhale 2 puffs into the lungs every 4 (four) hours as needed. (Patient taking differently: Inhale 2 puffs into the lungs every 4 (four) hours as needed (shortness of breath).) 1 each 0  . amiodarone (PACERONE) 200 MG tablet Take 1 tablet (200 mg total) by mouth daily. 30 tablet 3  . Ascorbic Acid (VITAMIN C PO) Take by mouth.    Marland Kitchen aspirin EC 81 MG EC tablet Take 1 tablet (81 mg total) by mouth daily.    Marland Kitchen azelastine (ASTELIN) 0.1 % nasal spray SPRAY 2 SPRAYS INTO EACH NOSTRIL TWICE A DAY AS DIRECTED (Patient taking differently: Place 2 sprays into both nostrils daily as needed for rhinitis or allergies.) 30 mL 1  . Dulaglutide (TRULICITY) 4.5 QQ/7.6PP SOPN Inject 4.5 mg into the skin every Sunday. (Patient taking differently: Inject 4.5 mg into the skin every Monday.) 4 pen 2  . ELIQUIS 5 MG TABS tablet TAKE 1 TABLET BY MOUTH TWICE A DAY 60 tablet 2  . furosemide (LASIX) 40 MG tablet TAKE 1  TABLET BY MOUTH EVERY DAY (Patient taking differently: Take 40 mg by mouth as needed for fluid or edema.) 90 tablet 3  . gabapentin (NEURONTIN) 300 MG capsule Take 1 capsule (300 mg total) by mouth 2 (two) times daily. 90 capsule 3  . HYDROcodone-acetaminophen (NORCO/VICODIN) 5-325 MG tablet Take 1 tablet by mouth every 8 (eight) hours as needed for moderate pain. 15 tablet 0  . metoCLOPramide (REGLAN) 10 MG tablet Take 10 mg by mouth daily as needed for nausea or vomiting.     . metoprolol succinate (TOPROL-XL) 25 MG 24 hr tablet TAKE 1 TABLET BY MOUTH EVERY DAY 90 tablet 1  . metoprolol tartrate (LOPRESSOR) 25 MG tablet TAKE 1 TABLET BY MOUTH TWICE A DAY 60 tablet 1  . nitroGLYCERIN (NITROSTAT) 0.4 MG SL tablet Place 0.4 mg under the tongue every 5 (five) minutes as needed for chest pain.     Marland Kitchen ondansetron (ZOFRAN-ODT) 8 MG disintegrating tablet TAKE 1 TABLET BY MOUTH EVERY 8 HOURS AS NEEDED FOR NAUSEA 20 tablet 11  . pantoprazole (PROTONIX) 40 MG tablet Take 1 tablet (40 mg total) by mouth daily. 90 tablet 3  . polyethylene glycol powder (GLYCOLAX/MIRALAX) 17 GM/SCOOP powder Take 0.5 Containers by mouth daily as needed for mild constipation or moderate constipation.     . pravastatin (PRAVACHOL) 80 MG tablet Take 1 tablet (80 mg total) by mouth daily. 90 tablet 3  . sertraline (ZOLOFT) 50 MG tablet Take 1 tablet (50 mg total) by mouth daily. 90 tablet 3  . tamsulosin (FLOMAX) 0.4 MG CAPS capsule TAKE 2 CAPSULES BY MOUTH  EVERY DAY 180 capsule 1  . Thiamine HCl (VITAMIN B-1 PO) Take by mouth.    . TRELEGY ELLIPTA 100-62.5-25 MCG/INH AEPB INHALE 1 PUFF BY MOUTH EVERY DAY (Patient taking differently: Inhale 1 puff into the lungs daily.) 60 each 11  . triamcinolone (NASACORT) 55 MCG/ACT AERO nasal inhaler PLACE 2 SPRAYS INTO THE NOSE DAILY. (Patient taking differently: Place 2 sprays into the nose daily as needed (congestion).) 1 Inhaler 11  . VITAMIN D PO Take by mouth. Vitamin D3    . Continuous  Blood Gluc Receiver (FREESTYLE LIBRE 14 DAY READER) DEVI 1 application by Does not apply route every 14 (fourteen) days. Use with sensor system (Patient not taking: Reported on 05/30/2020) 1 each 0  . Continuous Blood Gluc Sensor (FREESTYLE LIBRE 14 DAY SENSOR) MISC 1 application by Does not apply route every 14 (fourteen) days. Apply upper deltoid every 14 days, use reader to determine blood sugars (Patient not taking: Reported on 05/30/2020) 2 each 11   No current facility-administered medications for this visit.     Past Medical History:  Diagnosis Date  . Arthritis   . CAD (coronary artery disease)    07/01/2019: LHC with LM disease. Needs CABG.  . Cancer [facility]) 2013   left kidney  . Complication of anesthesia    slow to wake up after kidney stone surgery  . Constipation, chronic   . COPD (chronic obstructive pulmonary disease) ([place])   . Diabetes mellitus ([place])   . Dyspnea    with exertion  . Family history of adverse reaction to anesthesia    daughter couldn't talk for 3-4 days after anesthesia   . Frequent urination   . GERD (gastroesophageal reflux disease)   . Headache   . Hernia   . History of kidney stones   . Hyperlipidemia   . Hypertension   . Peripheral vascular disease ([place])    right leg blood clot 20 plus years ago  . Pneumonia   . Renal insufficiency    stage 2 (only 1 kidney) had Kidney cancer  . Spondylolysis   . Tongue cancer ([place]) 2011    Past Surgical History:  Procedure Laterality Date  . CARDIOVERSION N/A 09/02/2019   Procedure: CARDIOVERSION;  Surgeon: Buford Dresser, MD;  Location: [place];  Service: Cardiovascular;  Laterality: N/A;  . CENTRAL VENOUS CATHETER INSERTION Right 07/12/2019   Procedure: Insertion Central Line Adult;  Surgeon: Ivin Poot, MD;  Location: [place];  Service: Open Heart Surgery;  Laterality: Right;  Right subclavian   . COLONOSCOPY    . CORONARY ARTERY BYPASS GRAFT N/A 07/12/2019   Procedure: CORONARY ARTERY  BYPASS GRAFTING (CABG), ON PUMP, TIMES FOUR , USING LEFT INTERNAL MAMMARY ARTERY AND ENDOSCOPICALLY HARVESTED BILATERAL GREATER SAPHENOUS VEINS;  Surgeon: Ivin Poot, MD;  Location: [place];  Service: Open Heart Surgery;  Laterality: N/A;  . HERNIA REPAIR     abdominal  . KIDNEY STONE SURGERY    . LEFT HEART CATH AND CORONARY ANGIOGRAPHY N/A 07/01/2019   Procedure: LEFT HEART CATH AND CORONARY ANGIOGRAPHY;  Surgeon: Belva Crome, MD;  Location: [place] [facility];  Service: Cardiovascular;  Laterality: N/A;  . NEPHRECTOMY Left 10/08/2011   s/p robotic assisted lap left radical nephrectomy 10/08/11  . PARTIAL GLOSSECTOMY  12/07/2009   s/p partial glossectomy for SCC right tongue 12/07/09  . TEE WITHOUT CARDIOVERSION N/A 07/12/2019   Procedure: TRANSESOPHAGEAL ECHOCARDIOGRAM (TEE);  Surgeon: Prescott Gum, Collier Salina, MD;  Location: [place];  Service:  Open Heart Surgery;  Laterality: N/A;    Social History   Socioeconomic History  . Marital status: Married    Spouse name: Not on file  . Number of children: 2  . Years of education: Not on file  . Highest education level: Not on file  Occupational History  . Not on file  Tobacco Use  . Smoking status: Former Smoker    Packs/day: 1.00    Years: 2.00    Pack years: 2.00    Types: Cigarettes  . Smokeless tobacco: Never Used  . Tobacco comment: smoked 3 years as a teenager  Substance and Sexual Activity  . Alcohol use: No  . Drug use: No  . Sexual activity: Never  Other Topics Concern  . Not on file  Social History Narrative  . Not on file   Social Determinants of Health   Financial Resource Strain: Not on file  Food Insecurity: Not on file  Transportation Needs: Not on file  Physical Activity: Not on file  Stress: Not on file  Social Connections: Not on file  Intimate Partner Violence: Not on file    Family History  Problem Relation Age of Onset  . Cancer Mother        bladder  . Heart attack Father     ROS: no fevers or  chills, productive cough, hemoptysis, dysphasia, odynophagia, melena, hematochezia, dysuria, hematuria, rash, seizure activity, orthopnea, PND, pedal edema, claudication. Remaining systems are negative.  Physical Exam: Well-developed well-nourished in no acute distress.  Skin is warm and dry.  HEENT is normal.  Neck is supple.  Chest is clear to auscultation with normal expansion.  Cardiovascular exam is regular rate and rhythm.  Abdominal exam nontender or distended. No masses palpated. Extremities show no edema. neuro grossly intact  ECG-sinus rhythm at a rate of 69, first-degree AV block, RV conduction delay.  Personally reviewed  A/P  1 coronary artery disease status post coronary artery bypass and graft-patient denies chest pain.  Continue statin.  DC aspirin given need for apixaban.  2 history of atrial fibrillation-this occurred in the postoperative setting.  Patient remains in sinus rhythm.  I will therefore discontinue amiodarone and follow.  Note previous atrial fibrillation occurred 1 month after coronary artery bypass and graft and therefore I am hesitant to discontinue anticoagulation at this point but will consider in the future.  Continue apixaban.  3 hypertension-patient's blood pressure is controlled.  Continue present medical regimen.  4 hyperlipidemia-continue statin.  5 dilated aortic root-patient had follow-up chest CT June 2021 at [facility] does not mention thoracic aneurysm.  6 lung nodule-follow-up chest CT June 2021 at [facility] showed no pulmonary nodules.  7 chronic stage III kidney disease-Per nephrology.  Kirk Ruths, MD

## 2020-05-23 ENCOUNTER — Other Ambulatory Visit: Payer: Self-pay

## 2020-05-23 DIAGNOSIS — M5136 Other intervertebral disc degeneration, lumbar region: Secondary | ICD-10-CM

## 2020-05-23 MED ORDER — HYDROCODONE-ACETAMINOPHEN 5-325 MG PO TABS: 1.0000 | ORAL_TABLET | Freq: Three times a day (TID) | ORAL | 0 refills | Status: DC | PRN

## 2020-05-23 NOTE — Telephone Encounter (Signed)
Ivin Booty called for a refill on patient's hydrocodone.

## 2020-05-30 ENCOUNTER — Encounter: Payer: Self-pay | Admitting: Cardiology

## 2020-05-30 ENCOUNTER — Telehealth: Payer: Self-pay | Admitting: Sports Medicine

## 2020-05-30 ENCOUNTER — Ambulatory Visit (INDEPENDENT_AMBULATORY_CARE_PROVIDER_SITE_OTHER): Payer: Medicare Other | Admitting: Cardiology

## 2020-05-30 ENCOUNTER — Other Ambulatory Visit: Payer: Self-pay

## 2020-05-30 VITALS — BP 144/88 | HR 70 | Ht 67.0 in | Wt 230.4 lb

## 2020-05-30 DIAGNOSIS — I712 Thoracic aortic aneurysm, without rupture, unspecified: Secondary | ICD-10-CM

## 2020-05-30 DIAGNOSIS — E7800 Pure hypercholesterolemia, unspecified: Secondary | ICD-10-CM

## 2020-05-30 DIAGNOSIS — I2510 Atherosclerotic heart disease of native coronary artery without angina pectoris: Secondary | ICD-10-CM

## 2020-05-30 DIAGNOSIS — I10 Essential (primary) hypertension: Secondary | ICD-10-CM

## 2020-05-30 DIAGNOSIS — I480 Paroxysmal atrial fibrillation: Secondary | ICD-10-CM

## 2020-05-30 NOTE — Telephone Encounter (Signed)
Patient is requesting a Pneumonia Vaccine (triage said this has to be approved by PCP) Patient is scheduled for a flu vaccine on 06/08/20..,  AM

## 2020-05-30 NOTE — Patient Instructions (Signed)
Medication Instructions:   STOP Aspirin   STOP Amiodarone   *If you need a refill on your cardiac medications before your next appointment, please call your pharmacy*  Lab Work: NONE ordered at this time of appointment   If you have labs (blood work) drawn today and your tests are completely normal, you will receive your results only by: Marland Kitchen MyChart Message (if you have MyChart) OR . A paper copy in the mail If you have any lab test that is abnormal or we need to change your treatment, we will call you to review the results.  Testing/Procedures: NONE ordered at this time of appointment   Follow-Up: At [facility], you and your health needs are our priority.  As part of our continuing mission to provide you with exceptional heart care, we have created designated Provider Care Teams.  These Care Teams include your primary Cardiologist (physician) and Advanced Practice Providers (APPs -  Physician Assistants and Nurse Practitioners) who all work together to provide you with the care you need, when you need it.  Your next appointment:   6 month(s)  The format for your next appointment:   In Person  Provider:   Kirk Ruths, MD  Other Instructions

## 2020-05-30 NOTE — Telephone Encounter (Signed)
He is actually okay, he had both of his pneumococcal vaccines after the age of 62.  So I do not think he needs another 1.

## 2020-05-31 DIAGNOSIS — Z951 Presence of aortocoronary bypass graft: Secondary | ICD-10-CM | POA: Diagnosis not present

## 2020-05-31 DIAGNOSIS — N200 Calculus of kidney: Secondary | ICD-10-CM | POA: Diagnosis not present

## 2020-05-31 DIAGNOSIS — C642 Malignant neoplasm of left kidney, except renal pelvis: Secondary | ICD-10-CM | POA: Diagnosis not present

## 2020-05-31 DIAGNOSIS — K429 Umbilical hernia without obstruction or gangrene: Secondary | ICD-10-CM | POA: Diagnosis not present

## 2020-05-31 DIAGNOSIS — I2510 Atherosclerotic heart disease of native coronary artery without angina pectoris: Secondary | ICD-10-CM | POA: Diagnosis not present

## 2020-05-31 DIAGNOSIS — C649 Malignant neoplasm of unspecified kidney, except renal pelvis: Secondary | ICD-10-CM | POA: Diagnosis not present

## 2020-05-31 DIAGNOSIS — C787 Secondary malignant neoplasm of liver and intrahepatic bile duct: Secondary | ICD-10-CM | POA: Diagnosis not present

## 2020-05-31 DIAGNOSIS — R911 Solitary pulmonary nodule: Secondary | ICD-10-CM | POA: Diagnosis not present

## 2020-05-31 DIAGNOSIS — N289 Disorder of kidney and ureter, unspecified: Secondary | ICD-10-CM | POA: Diagnosis not present

## 2020-05-31 NOTE — Telephone Encounter (Signed)
LVM advising information below. AM

## 2020-06-01 DIAGNOSIS — C787 Secondary malignant neoplasm of liver and intrahepatic bile duct: Secondary | ICD-10-CM | POA: Diagnosis not present

## 2020-06-01 DIAGNOSIS — C649 Malignant neoplasm of unspecified kidney, except renal pelvis: Secondary | ICD-10-CM | POA: Diagnosis not present

## 2020-06-01 DIAGNOSIS — N1832 Chronic kidney disease, stage 3b: Secondary | ICD-10-CM | POA: Diagnosis not present

## 2020-06-04 DIAGNOSIS — E119 Type 2 diabetes mellitus without complications: Secondary | ICD-10-CM | POA: Diagnosis not present

## 2020-06-04 DIAGNOSIS — C642 Malignant neoplasm of left kidney, except renal pelvis: Secondary | ICD-10-CM | POA: Diagnosis not present

## 2020-06-04 DIAGNOSIS — N184 Chronic kidney disease, stage 4 (severe): Secondary | ICD-10-CM | POA: Diagnosis not present

## 2020-06-04 DIAGNOSIS — Z6837 Body mass index (BMI) 37.0-37.9, adult: Secondary | ICD-10-CM | POA: Diagnosis not present

## 2020-06-04 DIAGNOSIS — J449 Chronic obstructive pulmonary disease, unspecified: Secondary | ICD-10-CM | POA: Diagnosis not present

## 2020-06-04 DIAGNOSIS — I480 Paroxysmal atrial fibrillation: Secondary | ICD-10-CM | POA: Diagnosis not present

## 2020-06-04 DIAGNOSIS — C109 Malignant neoplasm of oropharynx, unspecified: Secondary | ICD-10-CM | POA: Diagnosis not present

## 2020-06-05 DIAGNOSIS — C649 Malignant neoplasm of unspecified kidney, except renal pelvis: Secondary | ICD-10-CM | POA: Diagnosis not present

## 2020-06-05 DIAGNOSIS — C7989 Secondary malignant neoplasm of other specified sites: Secondary | ICD-10-CM | POA: Diagnosis not present

## 2020-06-05 DIAGNOSIS — N401 Enlarged prostate with lower urinary tract symptoms: Secondary | ICD-10-CM | POA: Diagnosis not present

## 2020-06-05 DIAGNOSIS — R222 Localized swelling, mass and lump, trunk: Secondary | ICD-10-CM | POA: Diagnosis not present

## 2020-06-08 ENCOUNTER — Ambulatory Visit (INDEPENDENT_AMBULATORY_CARE_PROVIDER_SITE_OTHER): Payer: Medicare Other | Admitting: Sports Medicine

## 2020-06-08 VITALS — Temp 98.2°F

## 2020-06-08 DIAGNOSIS — Z23 Encounter for immunization: Secondary | ICD-10-CM

## 2020-06-08 NOTE — Progress Notes (Signed)
Pt here for flu shot. Afebrile,no recent illness. Vaccination given, pt tolerated well.  

## 2020-06-15 DIAGNOSIS — C7989 Secondary malignant neoplasm of other specified sites: Secondary | ICD-10-CM | POA: Diagnosis not present

## 2020-06-15 DIAGNOSIS — C642 Malignant neoplasm of left kidney, except renal pelvis: Secondary | ICD-10-CM | POA: Diagnosis not present

## 2020-06-15 DIAGNOSIS — Z87891 Personal history of nicotine dependence: Secondary | ICD-10-CM | POA: Diagnosis not present

## 2020-06-15 DIAGNOSIS — Z79899 Other long term (current) drug therapy: Secondary | ICD-10-CM | POA: Diagnosis not present

## 2020-06-15 DIAGNOSIS — M6289 Other specified disorders of muscle: Secondary | ICD-10-CM | POA: Diagnosis not present

## 2020-06-19 ENCOUNTER — Other Ambulatory Visit: Payer: Self-pay | Admitting: Sports Medicine

## 2020-06-19 ENCOUNTER — Other Ambulatory Visit: Payer: Self-pay | Admitting: Cardiology

## 2020-06-19 DIAGNOSIS — I10 Essential (primary) hypertension: Secondary | ICD-10-CM

## 2020-06-25 DIAGNOSIS — C109 Malignant neoplasm of oropharynx, unspecified: Secondary | ICD-10-CM | POA: Diagnosis not present

## 2020-06-25 DIAGNOSIS — C642 Malignant neoplasm of left kidney, except renal pelvis: Secondary | ICD-10-CM | POA: Diagnosis not present

## 2020-06-27 ENCOUNTER — Other Ambulatory Visit: Payer: Self-pay | Admitting: Sports Medicine

## 2020-06-27 DIAGNOSIS — K219 Gastro-esophageal reflux disease without esophagitis: Secondary | ICD-10-CM

## 2020-06-28 DIAGNOSIS — R222 Localized swelling, mass and lump, trunk: Secondary | ICD-10-CM | POA: Diagnosis not present

## 2020-07-06 ENCOUNTER — Encounter: Payer: Self-pay | Admitting: Sports Medicine

## 2020-07-06 ENCOUNTER — Other Ambulatory Visit: Payer: Self-pay | Admitting: Sports Medicine

## 2020-07-06 DIAGNOSIS — Z8049 Family history of malignant neoplasm of other genital organs: Secondary | ICD-10-CM | POA: Diagnosis not present

## 2020-07-06 DIAGNOSIS — C228 Malignant neoplasm of liver, primary, unspecified as to type: Secondary | ICD-10-CM | POA: Diagnosis not present

## 2020-07-06 DIAGNOSIS — J440 Chronic obstructive pulmonary disease with acute lower respiratory infection: Secondary | ICD-10-CM

## 2020-07-14 DIAGNOSIS — Z20822 Contact with and (suspected) exposure to covid-19: Secondary | ICD-10-CM | POA: Diagnosis not present

## 2020-07-14 DIAGNOSIS — Z01812 Encounter for preprocedural laboratory examination: Secondary | ICD-10-CM | POA: Diagnosis not present

## 2020-07-16 DIAGNOSIS — C7902 Secondary malignant neoplasm of left kidney and renal pelvis: Secondary | ICD-10-CM | POA: Diagnosis not present

## 2020-07-16 DIAGNOSIS — C7900 Secondary malignant neoplasm of unspecified kidney and renal pelvis: Secondary | ICD-10-CM | POA: Diagnosis not present

## 2020-07-17 DIAGNOSIS — Z905 Acquired absence of kidney: Secondary | ICD-10-CM | POA: Diagnosis not present

## 2020-07-17 DIAGNOSIS — Z79899 Other long term (current) drug therapy: Secondary | ICD-10-CM | POA: Diagnosis not present

## 2020-07-17 DIAGNOSIS — Z85819 Personal history of malignant neoplasm of unspecified site of lip, oral cavity, and pharynx: Secondary | ICD-10-CM | POA: Diagnosis not present

## 2020-07-17 DIAGNOSIS — I480 Paroxysmal atrial fibrillation: Secondary | ICD-10-CM | POA: Diagnosis not present

## 2020-07-17 DIAGNOSIS — M6289 Other specified disorders of muscle: Secondary | ICD-10-CM | POA: Diagnosis not present

## 2020-07-17 DIAGNOSIS — E785 Hyperlipidemia, unspecified: Secondary | ICD-10-CM | POA: Diagnosis not present

## 2020-07-17 DIAGNOSIS — E1122 Type 2 diabetes mellitus with diabetic chronic kidney disease: Secondary | ICD-10-CM | POA: Diagnosis not present

## 2020-07-17 DIAGNOSIS — K429 Umbilical hernia without obstruction or gangrene: Secondary | ICD-10-CM | POA: Diagnosis not present

## 2020-07-17 DIAGNOSIS — Z6836 Body mass index (BMI) 36.0-36.9, adult: Secondary | ICD-10-CM | POA: Diagnosis not present

## 2020-07-17 DIAGNOSIS — I129 Hypertensive chronic kidney disease with stage 1 through stage 4 chronic kidney disease, or unspecified chronic kidney disease: Secondary | ICD-10-CM | POA: Diagnosis not present

## 2020-07-17 DIAGNOSIS — Z888 Allergy status to other drugs, medicaments and biological substances status: Secondary | ICD-10-CM | POA: Diagnosis not present

## 2020-07-17 DIAGNOSIS — K439 Ventral hernia without obstruction or gangrene: Secondary | ICD-10-CM | POA: Diagnosis not present

## 2020-07-17 DIAGNOSIS — Z87891 Personal history of nicotine dependence: Secondary | ICD-10-CM | POA: Diagnosis not present

## 2020-07-17 DIAGNOSIS — N1832 Chronic kidney disease, stage 3b: Secondary | ICD-10-CM | POA: Diagnosis not present

## 2020-07-17 DIAGNOSIS — I2510 Atherosclerotic heart disease of native coronary artery without angina pectoris: Secondary | ICD-10-CM | POA: Diagnosis not present

## 2020-07-17 DIAGNOSIS — E1165 Type 2 diabetes mellitus with hyperglycemia: Secondary | ICD-10-CM | POA: Diagnosis not present

## 2020-07-17 DIAGNOSIS — Z951 Presence of aortocoronary bypass graft: Secondary | ICD-10-CM | POA: Diagnosis not present

## 2020-07-17 DIAGNOSIS — Z85528 Personal history of other malignant neoplasm of kidney: Secondary | ICD-10-CM | POA: Diagnosis not present

## 2020-07-17 DIAGNOSIS — Z7901 Long term (current) use of anticoagulants: Secondary | ICD-10-CM | POA: Diagnosis not present

## 2020-07-17 DIAGNOSIS — G4730 Sleep apnea, unspecified: Secondary | ICD-10-CM | POA: Diagnosis not present

## 2020-07-17 DIAGNOSIS — Z880 Allergy status to penicillin: Secondary | ICD-10-CM | POA: Diagnosis not present

## 2020-07-17 DIAGNOSIS — C7902 Secondary malignant neoplasm of left kidney and renal pelvis: Secondary | ICD-10-CM | POA: Diagnosis not present

## 2020-07-17 DIAGNOSIS — C642 Malignant neoplasm of left kidney, except renal pelvis: Secondary | ICD-10-CM | POA: Diagnosis not present

## 2020-07-17 DIAGNOSIS — E669 Obesity, unspecified: Secondary | ICD-10-CM | POA: Diagnosis not present

## 2020-07-17 DIAGNOSIS — Z6835 Body mass index (BMI) 35.0-35.9, adult: Secondary | ICD-10-CM | POA: Diagnosis not present

## 2020-07-17 DIAGNOSIS — M1990 Unspecified osteoarthritis, unspecified site: Secondary | ICD-10-CM | POA: Diagnosis not present

## 2020-07-17 DIAGNOSIS — Z7951 Long term (current) use of inhaled steroids: Secondary | ICD-10-CM | POA: Diagnosis not present

## 2020-07-17 DIAGNOSIS — K469 Unspecified abdominal hernia without obstruction or gangrene: Secondary | ICD-10-CM | POA: Diagnosis not present

## 2020-07-17 DIAGNOSIS — N400 Enlarged prostate without lower urinary tract symptoms: Secondary | ICD-10-CM | POA: Diagnosis not present

## 2020-07-17 DIAGNOSIS — C7989 Secondary malignant neoplasm of other specified sites: Secondary | ICD-10-CM | POA: Diagnosis not present

## 2020-07-17 DIAGNOSIS — J449 Chronic obstructive pulmonary disease, unspecified: Secondary | ICD-10-CM | POA: Diagnosis not present

## 2020-07-18 DIAGNOSIS — J449 Chronic obstructive pulmonary disease, unspecified: Secondary | ICD-10-CM | POA: Diagnosis not present

## 2020-07-18 DIAGNOSIS — C7989 Secondary malignant neoplasm of other specified sites: Secondary | ICD-10-CM | POA: Diagnosis not present

## 2020-07-18 DIAGNOSIS — I129 Hypertensive chronic kidney disease with stage 1 through stage 4 chronic kidney disease, or unspecified chronic kidney disease: Secondary | ICD-10-CM | POA: Diagnosis not present

## 2020-07-18 DIAGNOSIS — N1832 Chronic kidney disease, stage 3b: Secondary | ICD-10-CM | POA: Diagnosis not present

## 2020-07-18 DIAGNOSIS — K429 Umbilical hernia without obstruction or gangrene: Secondary | ICD-10-CM | POA: Diagnosis not present

## 2020-07-18 DIAGNOSIS — E1122 Type 2 diabetes mellitus with diabetic chronic kidney disease: Secondary | ICD-10-CM | POA: Diagnosis not present

## 2020-07-22 ENCOUNTER — Other Ambulatory Visit: Payer: Self-pay | Admitting: Sports Medicine

## 2020-07-23 ENCOUNTER — Encounter: Payer: Self-pay | Admitting: Sports Medicine

## 2020-07-23 DIAGNOSIS — Z9889 Other specified postprocedural states: Secondary | ICD-10-CM | POA: Diagnosis not present

## 2020-07-25 DIAGNOSIS — I25810 Atherosclerosis of coronary artery bypass graft(s) without angina pectoris: Secondary | ICD-10-CM | POA: Diagnosis not present

## 2020-07-25 DIAGNOSIS — C109 Malignant neoplasm of oropharynx, unspecified: Secondary | ICD-10-CM | POA: Diagnosis not present

## 2020-07-25 DIAGNOSIS — R222 Localized swelling, mass and lump, trunk: Secondary | ICD-10-CM | POA: Diagnosis not present

## 2020-07-25 DIAGNOSIS — Z1379 Encounter for other screening for genetic and chromosomal anomalies: Secondary | ICD-10-CM | POA: Diagnosis not present

## 2020-07-25 DIAGNOSIS — N1832 Chronic kidney disease, stage 3b: Secondary | ICD-10-CM | POA: Diagnosis not present

## 2020-07-25 DIAGNOSIS — C642 Malignant neoplasm of left kidney, except renal pelvis: Secondary | ICD-10-CM | POA: Diagnosis not present

## 2020-07-25 DIAGNOSIS — Z6837 Body mass index (BMI) 37.0-37.9, adult: Secondary | ICD-10-CM | POA: Diagnosis not present

## 2020-07-25 DIAGNOSIS — E119 Type 2 diabetes mellitus without complications: Secondary | ICD-10-CM | POA: Diagnosis not present

## 2020-07-25 DIAGNOSIS — C787 Secondary malignant neoplasm of liver and intrahepatic bile duct: Secondary | ICD-10-CM | POA: Diagnosis not present

## 2020-07-25 DIAGNOSIS — D649 Anemia, unspecified: Secondary | ICD-10-CM | POA: Diagnosis not present

## 2020-07-25 DIAGNOSIS — I10 Essential (primary) hypertension: Secondary | ICD-10-CM | POA: Diagnosis not present

## 2020-07-26 DIAGNOSIS — Z9889 Other specified postprocedural states: Secondary | ICD-10-CM | POA: Diagnosis not present

## 2020-07-27 DIAGNOSIS — K828 Other specified diseases of gallbladder: Secondary | ICD-10-CM | POA: Diagnosis not present

## 2020-07-28 DIAGNOSIS — Z7901 Long term (current) use of anticoagulants: Secondary | ICD-10-CM | POA: Diagnosis not present

## 2020-07-28 DIAGNOSIS — E785 Hyperlipidemia, unspecified: Secondary | ICD-10-CM | POA: Diagnosis present

## 2020-07-28 DIAGNOSIS — D631 Anemia in chronic kidney disease: Secondary | ICD-10-CM | POA: Diagnosis present

## 2020-07-28 DIAGNOSIS — M1990 Unspecified osteoarthritis, unspecified site: Secondary | ICD-10-CM | POA: Diagnosis present

## 2020-07-28 DIAGNOSIS — I480 Paroxysmal atrial fibrillation: Secondary | ICD-10-CM | POA: Diagnosis present

## 2020-07-28 DIAGNOSIS — E1122 Type 2 diabetes mellitus with diabetic chronic kidney disease: Secondary | ICD-10-CM | POA: Diagnosis present

## 2020-07-28 DIAGNOSIS — I129 Hypertensive chronic kidney disease with stage 1 through stage 4 chronic kidney disease, or unspecified chronic kidney disease: Secondary | ICD-10-CM | POA: Diagnosis present

## 2020-07-28 DIAGNOSIS — T8140XA Infection following a procedure, unspecified, initial encounter: Secondary | ICD-10-CM | POA: Diagnosis present

## 2020-07-28 DIAGNOSIS — Z87442 Personal history of urinary calculi: Secondary | ICD-10-CM | POA: Diagnosis not present

## 2020-07-28 DIAGNOSIS — Z79899 Other long term (current) drug therapy: Secondary | ICD-10-CM | POA: Diagnosis not present

## 2020-07-28 DIAGNOSIS — Z880 Allergy status to penicillin: Secondary | ICD-10-CM | POA: Diagnosis not present

## 2020-07-28 DIAGNOSIS — J449 Chronic obstructive pulmonary disease, unspecified: Secondary | ICD-10-CM | POA: Diagnosis present

## 2020-07-28 DIAGNOSIS — Z85520 Personal history of malignant carcinoid tumor of kidney: Secondary | ICD-10-CM | POA: Diagnosis not present

## 2020-07-28 DIAGNOSIS — E6609 Other obesity due to excess calories: Secondary | ICD-10-CM | POA: Diagnosis present

## 2020-07-28 DIAGNOSIS — Z6837 Body mass index (BMI) 37.0-37.9, adult: Secondary | ICD-10-CM | POA: Diagnosis not present

## 2020-07-28 DIAGNOSIS — I2510 Atherosclerotic heart disease of native coronary artery without angina pectoris: Secondary | ICD-10-CM | POA: Diagnosis present

## 2020-07-28 DIAGNOSIS — C44509 Unspecified malignant neoplasm of skin of other part of trunk: Secondary | ICD-10-CM | POA: Diagnosis present

## 2020-07-28 DIAGNOSIS — Z7951 Long term (current) use of inhaled steroids: Secondary | ICD-10-CM | POA: Diagnosis not present

## 2020-07-28 DIAGNOSIS — N400 Enlarged prostate without lower urinary tract symptoms: Secondary | ICD-10-CM | POA: Diagnosis present

## 2020-07-28 DIAGNOSIS — L02211 Cutaneous abscess of abdominal wall: Secondary | ICD-10-CM | POA: Diagnosis present

## 2020-07-28 DIAGNOSIS — Z888 Allergy status to other drugs, medicaments and biological substances status: Secondary | ICD-10-CM | POA: Diagnosis not present

## 2020-07-28 DIAGNOSIS — Z951 Presence of aortocoronary bypass graft: Secondary | ICD-10-CM | POA: Diagnosis not present

## 2020-07-28 DIAGNOSIS — Z905 Acquired absence of kidney: Secondary | ICD-10-CM | POA: Diagnosis not present

## 2020-07-28 DIAGNOSIS — N1832 Chronic kidney disease, stage 3b: Secondary | ICD-10-CM | POA: Diagnosis present

## 2020-08-02 DIAGNOSIS — I2510 Atherosclerotic heart disease of native coronary artery without angina pectoris: Secondary | ICD-10-CM | POA: Diagnosis not present

## 2020-08-02 DIAGNOSIS — N1832 Chronic kidney disease, stage 3b: Secondary | ICD-10-CM | POA: Diagnosis not present

## 2020-08-02 DIAGNOSIS — D631 Anemia in chronic kidney disease: Secondary | ICD-10-CM | POA: Diagnosis not present

## 2020-08-02 DIAGNOSIS — Z6835 Body mass index (BMI) 35.0-35.9, adult: Secondary | ICD-10-CM | POA: Diagnosis not present

## 2020-08-02 DIAGNOSIS — R1909 Other intra-abdominal and pelvic swelling, mass and lump: Secondary | ICD-10-CM | POA: Diagnosis not present

## 2020-08-02 DIAGNOSIS — C109 Malignant neoplasm of oropharynx, unspecified: Secondary | ICD-10-CM | POA: Diagnosis not present

## 2020-08-02 DIAGNOSIS — Z951 Presence of aortocoronary bypass graft: Secondary | ICD-10-CM | POA: Diagnosis not present

## 2020-08-02 DIAGNOSIS — Z1371 Encounter for nonprocreative screening for genetic disease carrier status: Secondary | ICD-10-CM | POA: Diagnosis not present

## 2020-08-02 DIAGNOSIS — E119 Type 2 diabetes mellitus without complications: Secondary | ICD-10-CM | POA: Diagnosis not present

## 2020-08-02 DIAGNOSIS — I129 Hypertensive chronic kidney disease with stage 1 through stage 4 chronic kidney disease, or unspecified chronic kidney disease: Secondary | ICD-10-CM | POA: Diagnosis not present

## 2020-08-02 DIAGNOSIS — C642 Malignant neoplasm of left kidney, except renal pelvis: Secondary | ICD-10-CM | POA: Diagnosis not present

## 2020-08-02 DIAGNOSIS — C787 Secondary malignant neoplasm of liver and intrahepatic bile duct: Secondary | ICD-10-CM | POA: Diagnosis not present

## 2020-08-03 DIAGNOSIS — N1832 Chronic kidney disease, stage 3b: Secondary | ICD-10-CM | POA: Diagnosis not present

## 2020-08-03 DIAGNOSIS — C642 Malignant neoplasm of left kidney, except renal pelvis: Secondary | ICD-10-CM | POA: Diagnosis not present

## 2020-08-03 DIAGNOSIS — I129 Hypertensive chronic kidney disease with stage 1 through stage 4 chronic kidney disease, or unspecified chronic kidney disease: Secondary | ICD-10-CM | POA: Diagnosis not present

## 2020-08-03 DIAGNOSIS — D631 Anemia in chronic kidney disease: Secondary | ICD-10-CM | POA: Diagnosis not present

## 2020-08-03 DIAGNOSIS — C109 Malignant neoplasm of oropharynx, unspecified: Secondary | ICD-10-CM | POA: Diagnosis not present

## 2020-08-03 DIAGNOSIS — C787 Secondary malignant neoplasm of liver and intrahepatic bile duct: Secondary | ICD-10-CM | POA: Diagnosis not present

## 2020-08-06 DIAGNOSIS — N1832 Chronic kidney disease, stage 3b: Secondary | ICD-10-CM | POA: Diagnosis not present

## 2020-08-06 DIAGNOSIS — Z85810 Personal history of malignant neoplasm of tongue: Secondary | ICD-10-CM | POA: Diagnosis not present

## 2020-08-06 DIAGNOSIS — Z800 Family history of malignant neoplasm of digestive organs: Secondary | ICD-10-CM | POA: Diagnosis not present

## 2020-08-06 DIAGNOSIS — Z8051 Family history of malignant neoplasm of kidney: Secondary | ICD-10-CM | POA: Diagnosis not present

## 2020-08-06 DIAGNOSIS — C642 Malignant neoplasm of left kidney, except renal pelvis: Secondary | ICD-10-CM | POA: Diagnosis not present

## 2020-08-06 DIAGNOSIS — C109 Malignant neoplasm of oropharynx, unspecified: Secondary | ICD-10-CM | POA: Diagnosis not present

## 2020-08-06 DIAGNOSIS — D631 Anemia in chronic kidney disease: Secondary | ICD-10-CM | POA: Diagnosis not present

## 2020-08-06 DIAGNOSIS — E119 Type 2 diabetes mellitus without complications: Secondary | ICD-10-CM | POA: Diagnosis not present

## 2020-08-06 DIAGNOSIS — E1122 Type 2 diabetes mellitus with diabetic chronic kidney disease: Secondary | ICD-10-CM | POA: Diagnosis not present

## 2020-08-06 DIAGNOSIS — L02211 Cutaneous abscess of abdominal wall: Secondary | ICD-10-CM | POA: Diagnosis not present

## 2020-08-06 DIAGNOSIS — I129 Hypertensive chronic kidney disease with stage 1 through stage 4 chronic kidney disease, or unspecified chronic kidney disease: Secondary | ICD-10-CM | POA: Diagnosis not present

## 2020-08-06 DIAGNOSIS — Z6834 Body mass index (BMI) 34.0-34.9, adult: Secondary | ICD-10-CM | POA: Diagnosis not present

## 2020-08-06 DIAGNOSIS — Z6837 Body mass index (BMI) 37.0-37.9, adult: Secondary | ICD-10-CM | POA: Diagnosis not present

## 2020-08-06 DIAGNOSIS — C787 Secondary malignant neoplasm of liver and intrahepatic bile duct: Secondary | ICD-10-CM | POA: Diagnosis not present

## 2020-08-06 DIAGNOSIS — Z1371 Encounter for nonprocreative screening for genetic disease carrier status: Secondary | ICD-10-CM | POA: Diagnosis not present

## 2020-08-06 DIAGNOSIS — Z905 Acquired absence of kidney: Secondary | ICD-10-CM | POA: Diagnosis not present

## 2020-08-06 DIAGNOSIS — Z5112 Encounter for antineoplastic immunotherapy: Secondary | ICD-10-CM | POA: Diagnosis not present

## 2020-08-06 DIAGNOSIS — Z794 Long term (current) use of insulin: Secondary | ICD-10-CM | POA: Diagnosis not present

## 2020-08-06 DIAGNOSIS — Z1379 Encounter for other screening for genetic and chromosomal anomalies: Secondary | ICD-10-CM | POA: Diagnosis not present

## 2020-08-06 DIAGNOSIS — C649 Malignant neoplasm of unspecified kidney, except renal pelvis: Secondary | ICD-10-CM | POA: Diagnosis not present

## 2020-08-08 DIAGNOSIS — Z9889 Other specified postprocedural states: Secondary | ICD-10-CM | POA: Diagnosis not present

## 2020-08-11 ENCOUNTER — Other Ambulatory Visit: Payer: Self-pay | Admitting: Sports Medicine

## 2020-08-11 DIAGNOSIS — I10 Essential (primary) hypertension: Secondary | ICD-10-CM

## 2020-08-11 DIAGNOSIS — E7800 Pure hypercholesterolemia, unspecified: Secondary | ICD-10-CM

## 2020-08-17 ENCOUNTER — Ambulatory Visit (INDEPENDENT_AMBULATORY_CARE_PROVIDER_SITE_OTHER): Payer: Medicare Other | Admitting: Sports Medicine

## 2020-08-17 ENCOUNTER — Encounter: Payer: Self-pay | Admitting: Sports Medicine

## 2020-08-17 ENCOUNTER — Other Ambulatory Visit: Payer: Self-pay

## 2020-08-17 DIAGNOSIS — C649 Malignant neoplasm of unspecified kidney, except renal pelvis: Secondary | ICD-10-CM | POA: Diagnosis not present

## 2020-08-17 DIAGNOSIS — E1165 Type 2 diabetes mellitus with hyperglycemia: Secondary | ICD-10-CM

## 2020-08-17 DIAGNOSIS — E1121 Type 2 diabetes mellitus with diabetic nephropathy: Secondary | ICD-10-CM

## 2020-08-17 DIAGNOSIS — I2510 Atherosclerotic heart disease of native coronary artery without angina pectoris: Secondary | ICD-10-CM | POA: Diagnosis not present

## 2020-08-17 DIAGNOSIS — IMO0002 Reserved for concepts with insufficient information to code with codable children: Secondary | ICD-10-CM

## 2020-08-17 MED ORDER — ONDANSETRON 8 MG PO TBDP: 8.0000 mg | ORAL_TABLET | Freq: Three times a day (TID) | ORAL | 3 refills | Status: DC | PRN

## 2020-08-17 MED ORDER — METOCLOPRAMIDE HCL 10 MG PO TABS: ORAL_TABLET | ORAL | 3 refills | Status: DC

## 2020-08-17 MED ORDER — NITROGLYCERIN 0.4 MG SL SUBL: 0.4000 mg | SUBLINGUAL_TABLET | SUBLINGUAL | 11 refills | Status: DC | PRN

## 2020-08-17 NOTE — Progress Notes (Signed)
    Procedures performed today:    None.  Independent interpretation of notes and tests performed by another provider:   None.  Brief History, Exam, Impression, and Recommendations:    History of renal cancer status post left nephrectomy Clinton Ortiz has known metastatic renal cell carcinoma, he underwent an abdominal procedure, ultimately complicated by an abdominal wall abscess, he had a drain, this was removed, overall things are doing better. Unfortunately his recovery was plagued with severe nausea and vomiting from his antibiotic, the antibiotic was stopped, and things are doing better with Reglan and Zofran, refilling. If he develops increasing redness, fullness, or pain we will proceed with a CT abdomen and pelvis with IV contrast.    ___________________________________________ Gwen Her. Dianah Field, M.D., ABFM., CAQSM. Primary Care and [place] Instructor of [place] of [facility] of Medicine

## 2020-08-17 NOTE — Assessment & Plan Note (Addendum)
Clinton Ortiz has known metastatic renal cell carcinoma, he underwent an abdominal procedure, ultimately complicated by an abdominal wall abscess, he had a drain, this was removed, overall things are doing better. Unfortunately his recovery was plagued with severe nausea and vomiting from his antibiotic, the antibiotic was stopped, and things are doing better with Reglan and Zofran, refilling. If he develops increasing redness, fullness, or pain we will proceed with a CT abdomen and pelvis with IV contrast.

## 2020-08-23 DIAGNOSIS — Z9989 Dependence on other enabling machines and devices: Secondary | ICD-10-CM | POA: Diagnosis not present

## 2020-08-23 DIAGNOSIS — G4733 Obstructive sleep apnea (adult) (pediatric): Secondary | ICD-10-CM | POA: Diagnosis not present

## 2020-08-28 DIAGNOSIS — C109 Malignant neoplasm of oropharynx, unspecified: Secondary | ICD-10-CM | POA: Diagnosis not present

## 2020-08-28 DIAGNOSIS — C787 Secondary malignant neoplasm of liver and intrahepatic bile duct: Secondary | ICD-10-CM | POA: Diagnosis not present

## 2020-08-28 DIAGNOSIS — C642 Malignant neoplasm of left kidney, except renal pelvis: Secondary | ICD-10-CM | POA: Diagnosis not present

## 2020-08-28 DIAGNOSIS — C649 Malignant neoplasm of unspecified kidney, except renal pelvis: Secondary | ICD-10-CM | POA: Diagnosis not present

## 2020-08-28 DIAGNOSIS — N1832 Chronic kidney disease, stage 3b: Secondary | ICD-10-CM | POA: Diagnosis not present

## 2020-08-28 DIAGNOSIS — Z8051 Family history of malignant neoplasm of kidney: Secondary | ICD-10-CM | POA: Diagnosis not present

## 2020-08-28 DIAGNOSIS — Z1379 Encounter for other screening for genetic and chromosomal anomalies: Secondary | ICD-10-CM | POA: Diagnosis not present

## 2020-08-28 DIAGNOSIS — W19XXXA Unspecified fall, initial encounter: Secondary | ICD-10-CM | POA: Diagnosis not present

## 2020-09-05 ENCOUNTER — Other Ambulatory Visit: Payer: Self-pay | Admitting: Sports Medicine

## 2020-09-05 DIAGNOSIS — E1121 Type 2 diabetes mellitus with diabetic nephropathy: Secondary | ICD-10-CM

## 2020-09-10 ENCOUNTER — Other Ambulatory Visit: Payer: Self-pay | Admitting: Sports Medicine

## 2020-09-10 DIAGNOSIS — E1121 Type 2 diabetes mellitus with diabetic nephropathy: Secondary | ICD-10-CM

## 2020-09-13 DIAGNOSIS — N1832 Chronic kidney disease, stage 3b: Secondary | ICD-10-CM | POA: Diagnosis not present

## 2020-09-13 DIAGNOSIS — E1122 Type 2 diabetes mellitus with diabetic chronic kidney disease: Secondary | ICD-10-CM | POA: Diagnosis not present

## 2020-09-13 DIAGNOSIS — C786 Secondary malignant neoplasm of retroperitoneum and peritoneum: Secondary | ICD-10-CM | POA: Diagnosis not present

## 2020-09-13 DIAGNOSIS — Z1371 Encounter for nonprocreative screening for genetic disease carrier status: Secondary | ICD-10-CM | POA: Diagnosis not present

## 2020-09-13 DIAGNOSIS — C109 Malignant neoplasm of oropharynx, unspecified: Secondary | ICD-10-CM | POA: Diagnosis not present

## 2020-09-13 DIAGNOSIS — I10 Essential (primary) hypertension: Secondary | ICD-10-CM | POA: Diagnosis not present

## 2020-09-13 DIAGNOSIS — C787 Secondary malignant neoplasm of liver and intrahepatic bile duct: Secondary | ICD-10-CM | POA: Diagnosis not present

## 2020-09-13 DIAGNOSIS — C642 Malignant neoplasm of left kidney, except renal pelvis: Secondary | ICD-10-CM | POA: Diagnosis not present

## 2020-09-13 DIAGNOSIS — Z923 Personal history of irradiation: Secondary | ICD-10-CM | POA: Diagnosis not present

## 2020-09-13 DIAGNOSIS — Z5112 Encounter for antineoplastic immunotherapy: Secondary | ICD-10-CM | POA: Diagnosis not present

## 2020-09-13 DIAGNOSIS — E119 Type 2 diabetes mellitus without complications: Secondary | ICD-10-CM | POA: Diagnosis not present

## 2020-09-13 DIAGNOSIS — I129 Hypertensive chronic kidney disease with stage 1 through stage 4 chronic kidney disease, or unspecified chronic kidney disease: Secondary | ICD-10-CM | POA: Diagnosis not present

## 2020-09-13 DIAGNOSIS — Z85810 Personal history of malignant neoplasm of tongue: Secondary | ICD-10-CM | POA: Diagnosis not present

## 2020-09-13 DIAGNOSIS — Z1379 Encounter for other screening for genetic and chromosomal anomalies: Secondary | ICD-10-CM | POA: Diagnosis not present

## 2020-09-13 DIAGNOSIS — Z905 Acquired absence of kidney: Secondary | ICD-10-CM | POA: Diagnosis not present

## 2020-09-17 DIAGNOSIS — E860 Dehydration: Secondary | ICD-10-CM | POA: Diagnosis not present

## 2020-09-19 DIAGNOSIS — R808 Other proteinuria: Secondary | ICD-10-CM | POA: Diagnosis not present

## 2020-09-19 DIAGNOSIS — E860 Dehydration: Secondary | ICD-10-CM | POA: Diagnosis not present

## 2020-09-19 DIAGNOSIS — C109 Malignant neoplasm of oropharynx, unspecified: Secondary | ICD-10-CM | POA: Diagnosis not present

## 2020-10-03 DIAGNOSIS — N1832 Chronic kidney disease, stage 3b: Secondary | ICD-10-CM | POA: Diagnosis not present

## 2020-10-04 DIAGNOSIS — E1122 Type 2 diabetes mellitus with diabetic chronic kidney disease: Secondary | ICD-10-CM | POA: Diagnosis not present

## 2020-10-04 DIAGNOSIS — Z5112 Encounter for antineoplastic immunotherapy: Secondary | ICD-10-CM | POA: Diagnosis not present

## 2020-10-04 DIAGNOSIS — D649 Anemia, unspecified: Secondary | ICD-10-CM | POA: Diagnosis not present

## 2020-10-04 DIAGNOSIS — Z1379 Encounter for other screening for genetic and chromosomal anomalies: Secondary | ICD-10-CM | POA: Diagnosis not present

## 2020-10-04 DIAGNOSIS — J449 Chronic obstructive pulmonary disease, unspecified: Secondary | ICD-10-CM | POA: Diagnosis not present

## 2020-10-04 DIAGNOSIS — C109 Malignant neoplasm of oropharynx, unspecified: Secondary | ICD-10-CM | POA: Diagnosis not present

## 2020-10-04 DIAGNOSIS — I10 Essential (primary) hypertension: Secondary | ICD-10-CM | POA: Diagnosis not present

## 2020-10-04 DIAGNOSIS — I25810 Atherosclerosis of coronary artery bypass graft(s) without angina pectoris: Secondary | ICD-10-CM | POA: Diagnosis not present

## 2020-10-04 DIAGNOSIS — C642 Malignant neoplasm of left kidney, except renal pelvis: Secondary | ICD-10-CM | POA: Diagnosis not present

## 2020-10-04 DIAGNOSIS — E119 Type 2 diabetes mellitus without complications: Secondary | ICD-10-CM | POA: Diagnosis not present

## 2020-10-04 DIAGNOSIS — I480 Paroxysmal atrial fibrillation: Secondary | ICD-10-CM | POA: Diagnosis not present

## 2020-10-04 DIAGNOSIS — I129 Hypertensive chronic kidney disease with stage 1 through stage 4 chronic kidney disease, or unspecified chronic kidney disease: Secondary | ICD-10-CM | POA: Diagnosis not present

## 2020-10-04 DIAGNOSIS — N1832 Chronic kidney disease, stage 3b: Secondary | ICD-10-CM | POA: Diagnosis not present

## 2020-10-04 DIAGNOSIS — C787 Secondary malignant neoplasm of liver and intrahepatic bile duct: Secondary | ICD-10-CM | POA: Diagnosis not present

## 2020-10-05 ENCOUNTER — Telehealth: Payer: Self-pay | Admitting: Sports Medicine

## 2020-10-05 NOTE — Chronic Care Management (AMB) (Signed)
  Chronic Care Management   Note  10/05/2020 Name: Clinton Ortiz MRN: [id number] DOB: 1941/01/11  Clinton Ortiz is a 80 y.o. year old male who is a primary care patient of Dianah Field, Gwen Her, MD. I reached out to [name] by phone today in response to a referral sent by Clinton Ortiz's PCP, Silverio Decamp, MD.   Clinton Ortiz was given information about Chronic Care Management services today including:  1. CCM service includes personalized support from designated clinical staff supervised by his physician, including individualized plan of care and coordination with other care providers 2. 24/7 contact phone numbers for assistance for urgent and routine care needs. 3. Service will only be billed when office clinical staff spend 20 minutes or more in a month to coordinate care. 4. Only one practitioner may furnish and bill the service in a calendar month. 5. The patient may stop CCM services at any time (effective at the end of the month) by phone call to the office staff.   Patient agreed to services and verbal consent obtained.   Follow up plan:   Clinton Ortiz Upstream Scheduler

## 2020-10-11 DIAGNOSIS — C109 Malignant neoplasm of oropharynx, unspecified: Secondary | ICD-10-CM | POA: Diagnosis not present

## 2020-10-11 DIAGNOSIS — C649 Malignant neoplasm of unspecified kidney, except renal pelvis: Secondary | ICD-10-CM | POA: Diagnosis not present

## 2020-10-11 DIAGNOSIS — C787 Secondary malignant neoplasm of liver and intrahepatic bile duct: Secondary | ICD-10-CM | POA: Diagnosis not present

## 2020-10-12 ENCOUNTER — Ambulatory Visit (INDEPENDENT_AMBULATORY_CARE_PROVIDER_SITE_OTHER): Payer: Medicare Other

## 2020-10-12 ENCOUNTER — Telehealth: Payer: Self-pay | Admitting: Sports Medicine

## 2020-10-12 DIAGNOSIS — W010XXD Fall on same level from slipping, tripping and stumbling without subsequent striking against object, subsequent encounter: Secondary | ICD-10-CM | POA: Diagnosis not present

## 2020-10-12 DIAGNOSIS — R079 Chest pain, unspecified: Secondary | ICD-10-CM | POA: Diagnosis not present

## 2020-10-12 DIAGNOSIS — R0789 Other chest pain: Secondary | ICD-10-CM

## 2020-10-12 NOTE — Assessment & Plan Note (Signed)
Recent fall, right rib injuries, ordering rib series x-rays.

## 2020-10-12 NOTE — Telephone Encounter (Signed)
Done

## 2020-10-12 NOTE — Telephone Encounter (Signed)
Pt fell down and is requesting Dr. Dianah Field to order a XRAY for his Rt rib area. Prefers not to wait in Urgent care since he is on Chemo. I advised pt to wait in Imaging

## 2020-10-18 DIAGNOSIS — C787 Secondary malignant neoplasm of liver and intrahepatic bile duct: Secondary | ICD-10-CM | POA: Diagnosis not present

## 2020-10-18 DIAGNOSIS — C649 Malignant neoplasm of unspecified kidney, except renal pelvis: Secondary | ICD-10-CM | POA: Diagnosis not present

## 2020-10-18 DIAGNOSIS — C109 Malignant neoplasm of oropharynx, unspecified: Secondary | ICD-10-CM | POA: Diagnosis not present

## 2020-10-25 DIAGNOSIS — C642 Malignant neoplasm of left kidney, except renal pelvis: Secondary | ICD-10-CM | POA: Diagnosis not present

## 2020-10-25 DIAGNOSIS — R0789 Other chest pain: Secondary | ICD-10-CM | POA: Diagnosis not present

## 2020-10-25 DIAGNOSIS — Z85810 Personal history of malignant neoplasm of tongue: Secondary | ICD-10-CM | POA: Diagnosis not present

## 2020-10-25 DIAGNOSIS — Z5112 Encounter for antineoplastic immunotherapy: Secondary | ICD-10-CM | POA: Diagnosis not present

## 2020-10-25 DIAGNOSIS — Z1379 Encounter for other screening for genetic and chromosomal anomalies: Secondary | ICD-10-CM | POA: Diagnosis not present

## 2020-10-25 DIAGNOSIS — Z8051 Family history of malignant neoplasm of kidney: Secondary | ICD-10-CM | POA: Diagnosis not present

## 2020-10-25 DIAGNOSIS — C109 Malignant neoplasm of oropharynx, unspecified: Secondary | ICD-10-CM | POA: Diagnosis not present

## 2020-10-25 DIAGNOSIS — N1832 Chronic kidney disease, stage 3b: Secondary | ICD-10-CM | POA: Diagnosis not present

## 2020-10-25 DIAGNOSIS — C787 Secondary malignant neoplasm of liver and intrahepatic bile duct: Secondary | ICD-10-CM | POA: Diagnosis not present

## 2020-10-26 DIAGNOSIS — Z9889 Other specified postprocedural states: Secondary | ICD-10-CM | POA: Diagnosis not present

## 2020-10-26 DIAGNOSIS — K439 Ventral hernia without obstruction or gangrene: Secondary | ICD-10-CM | POA: Diagnosis not present

## 2020-11-01 DIAGNOSIS — N200 Calculus of kidney: Secondary | ICD-10-CM | POA: Diagnosis not present

## 2020-11-01 DIAGNOSIS — K439 Ventral hernia without obstruction or gangrene: Secondary | ICD-10-CM | POA: Diagnosis not present

## 2020-11-01 DIAGNOSIS — K8689 Other specified diseases of pancreas: Secondary | ICD-10-CM | POA: Diagnosis not present

## 2020-11-04 ENCOUNTER — Other Ambulatory Visit: Payer: Self-pay | Admitting: Sports Medicine

## 2020-11-11 ENCOUNTER — Other Ambulatory Visit: Payer: Self-pay | Admitting: Sports Medicine

## 2020-11-11 DIAGNOSIS — E1121 Type 2 diabetes mellitus with diabetic nephropathy: Secondary | ICD-10-CM

## 2020-11-13 ENCOUNTER — Ambulatory Visit: Payer: Medicare Other

## 2020-11-15 ENCOUNTER — Other Ambulatory Visit: Payer: Self-pay | Admitting: *Deleted

## 2020-11-15 DIAGNOSIS — I480 Paroxysmal atrial fibrillation: Secondary | ICD-10-CM | POA: Diagnosis not present

## 2020-11-15 DIAGNOSIS — J449 Chronic obstructive pulmonary disease, unspecified: Secondary | ICD-10-CM | POA: Diagnosis not present

## 2020-11-15 DIAGNOSIS — E1122 Type 2 diabetes mellitus with diabetic chronic kidney disease: Secondary | ICD-10-CM | POA: Diagnosis not present

## 2020-11-15 DIAGNOSIS — C109 Malignant neoplasm of oropharynx, unspecified: Secondary | ICD-10-CM | POA: Diagnosis not present

## 2020-11-15 DIAGNOSIS — Z5112 Encounter for antineoplastic immunotherapy: Secondary | ICD-10-CM | POA: Diagnosis not present

## 2020-11-15 DIAGNOSIS — Z6837 Body mass index (BMI) 37.0-37.9, adult: Secondary | ICD-10-CM | POA: Diagnosis not present

## 2020-11-15 DIAGNOSIS — Z1379 Encounter for other screening for genetic and chromosomal anomalies: Secondary | ICD-10-CM | POA: Diagnosis not present

## 2020-11-15 DIAGNOSIS — I25810 Atherosclerosis of coronary artery bypass graft(s) without angina pectoris: Secondary | ICD-10-CM | POA: Diagnosis not present

## 2020-11-15 DIAGNOSIS — C787 Secondary malignant neoplasm of liver and intrahepatic bile duct: Secondary | ICD-10-CM | POA: Diagnosis not present

## 2020-11-15 DIAGNOSIS — Z9981 Dependence on supplemental oxygen: Secondary | ICD-10-CM | POA: Diagnosis not present

## 2020-11-15 DIAGNOSIS — M51369 Other intervertebral disc degeneration, lumbar region without mention of lumbar back pain or lower extremity pain: Secondary | ICD-10-CM

## 2020-11-15 DIAGNOSIS — N1832 Chronic kidney disease, stage 3b: Secondary | ICD-10-CM | POA: Diagnosis not present

## 2020-11-15 DIAGNOSIS — I129 Hypertensive chronic kidney disease with stage 1 through stage 4 chronic kidney disease, or unspecified chronic kidney disease: Secondary | ICD-10-CM | POA: Diagnosis not present

## 2020-11-15 DIAGNOSIS — M5136 Other intervertebral disc degeneration, lumbar region: Secondary | ICD-10-CM

## 2020-11-15 DIAGNOSIS — C642 Malignant neoplasm of left kidney, except renal pelvis: Secondary | ICD-10-CM | POA: Diagnosis not present

## 2020-11-15 DIAGNOSIS — D649 Anemia, unspecified: Secondary | ICD-10-CM | POA: Diagnosis not present

## 2020-11-15 DIAGNOSIS — Z6833 Body mass index (BMI) 33.0-33.9, adult: Secondary | ICD-10-CM | POA: Diagnosis not present

## 2020-11-15 NOTE — Telephone Encounter (Signed)
Pt's daughter left vm requesting an order for a humidifier bottle to go with his O2 to be sent to [facility].  Fax # [phone].

## 2020-11-16 DIAGNOSIS — C642 Malignant neoplasm of left kidney, except renal pelvis: Secondary | ICD-10-CM | POA: Diagnosis not present

## 2020-11-16 DIAGNOSIS — C787 Secondary malignant neoplasm of liver and intrahepatic bile duct: Secondary | ICD-10-CM | POA: Diagnosis not present

## 2020-11-16 DIAGNOSIS — R519 Headache, unspecified: Secondary | ICD-10-CM | POA: Diagnosis not present

## 2020-11-16 DIAGNOSIS — C649 Malignant neoplasm of unspecified kidney, except renal pelvis: Secondary | ICD-10-CM | POA: Diagnosis not present

## 2020-11-16 DIAGNOSIS — G319 Degenerative disease of nervous system, unspecified: Secondary | ICD-10-CM | POA: Diagnosis not present

## 2020-11-16 MED ORDER — AMBULATORY NON FORMULARY MEDICATION: 0 refills | Status: DC

## 2020-11-16 MED ORDER — HYDROCODONE-ACETAMINOPHEN 5-325 MG PO TABS: 1.0000 | ORAL_TABLET | Freq: Three times a day (TID) | ORAL | 0 refills | Status: DC | PRN

## 2020-11-16 NOTE — Telephone Encounter (Signed)
Al done.

## 2020-11-27 DIAGNOSIS — N184 Chronic kidney disease, stage 4 (severe): Secondary | ICD-10-CM | POA: Diagnosis not present

## 2020-11-28 ENCOUNTER — Other Ambulatory Visit: Payer: Self-pay

## 2020-11-28 ENCOUNTER — Ambulatory Visit (INDEPENDENT_AMBULATORY_CARE_PROVIDER_SITE_OTHER): Payer: Medicare Other | Admitting: Sports Medicine

## 2020-11-28 ENCOUNTER — Ambulatory Visit: Payer: Medicare Other | Admitting: Sports Medicine

## 2020-11-28 ENCOUNTER — Encounter: Payer: Self-pay | Admitting: Sports Medicine

## 2020-11-28 DIAGNOSIS — E1121 Type 2 diabetes mellitus with diabetic nephropathy: Secondary | ICD-10-CM

## 2020-11-28 DIAGNOSIS — G4733 Obstructive sleep apnea (adult) (pediatric): Secondary | ICD-10-CM | POA: Diagnosis not present

## 2020-11-28 DIAGNOSIS — J440 Chronic obstructive pulmonary disease with acute lower respiratory infection: Secondary | ICD-10-CM | POA: Diagnosis not present

## 2020-11-28 DIAGNOSIS — H6123 Impacted cerumen, bilateral: Secondary | ICD-10-CM | POA: Diagnosis not present

## 2020-11-28 DIAGNOSIS — I2510 Atherosclerotic heart disease of native coronary artery without angina pectoris: Secondary | ICD-10-CM

## 2020-11-28 DIAGNOSIS — Z0000 Encounter for general adult medical examination without abnormal findings: Secondary | ICD-10-CM

## 2020-11-28 MED ORDER — AMBULATORY NON FORMULARY MEDICATION: 11 refills | Status: DC

## 2020-11-28 MED ORDER — FREESTYLE LIBRE 2 READER DEVI: 1.0000 | Freq: Four times a day (QID) | 11 refills | Status: DC

## 2020-11-28 NOTE — Progress Notes (Signed)
    Procedures performed today:    Indication: Cerumen impaction of the left and right ear(s) Medical necessity statement: On physical examination, cerumen impairs clinically significant portions of the external auditory canal, and tympanic membrane. Noted obstructive, copious cerumen that cannot be removed without magnification and instrumentations requiring physician skills Consent: Discussed benefits and risks of procedure and verbal consent obtained Procedure: Patient was prepped for the procedure. Utilized an otoscope to assess and take note of the ear canal, the tympanic membrane, and the presence, amount, and placement of the cerumen. Gentle water irrigation and soft plastic curette was utilized to remove cerumen.  Post procedure examination: shows cerumen was completely removed. Patient tolerated procedure well. The patient is made aware that they may experience temporary vertigo, temporary hearing loss, and temporary discomfort. If these symptom last for more than 24 hours to call the clinic or proceed to the ED.  Independent interpretation of notes and tests performed by another provider:   None.  Brief History, Exam, Impression, and Recommendations:    COPD (chronic obstructive pulmonary disease) ([place]) Aul is on Trelegy 100 mg, he is having some increasing coughing, without fevers or chills, this seems to be chronic. His exam is benign and his lungs are completely clear. I gave him a Trelegy 200 mg sample today, if this seems to work better we will switch him to 200 mg.  Obstructive sleep apnea Refusing CPAP. Humidified oxygen is the next best bet. No change in plan there.  Hearing loss due to cerumen impaction, bilateral Bilateral cerumen impaction with difficulty hearing, we irrigated both canals today.    ___________________________________________ Gwen Her. Dianah Field, M.D., ABFM., CAQSM. Primary Care and [place] Instructor of [place] of [facility] of Medicine

## 2020-11-28 NOTE — Assessment & Plan Note (Signed)
Refusing CPAP. Humidified oxygen is the next best bet. No change in plan there.

## 2020-11-28 NOTE — Assessment & Plan Note (Signed)
Bilateral cerumen impaction with difficulty hearing, we irrigated both canals today.

## 2020-11-28 NOTE — Assessment & Plan Note (Addendum)
Clinton Ortiz is on Trelegy 100 mg, he is having some increasing coughing, without fevers or chills, this seems to be chronic. His exam is benign and his lungs are completely clear. I gave him a Trelegy 200 mg sample today, if this seems to work better we will switch him to 200 mg.

## 2020-12-06 DIAGNOSIS — Z6837 Body mass index (BMI) 37.0-37.9, adult: Secondary | ICD-10-CM | POA: Diagnosis not present

## 2020-12-06 DIAGNOSIS — C787 Secondary malignant neoplasm of liver and intrahepatic bile duct: Secondary | ICD-10-CM | POA: Diagnosis not present

## 2020-12-06 DIAGNOSIS — C642 Malignant neoplasm of left kidney, except renal pelvis: Secondary | ICD-10-CM | POA: Diagnosis not present

## 2020-12-06 DIAGNOSIS — T451X5A Adverse effect of antineoplastic and immunosuppressive drugs, initial encounter: Secondary | ICD-10-CM | POA: Diagnosis not present

## 2020-12-06 DIAGNOSIS — E032 Hypothyroidism due to medicaments and other exogenous substances: Secondary | ICD-10-CM | POA: Diagnosis not present

## 2020-12-06 DIAGNOSIS — Z905 Acquired absence of kidney: Secondary | ICD-10-CM | POA: Diagnosis not present

## 2020-12-06 DIAGNOSIS — N1832 Chronic kidney disease, stage 3b: Secondary | ICD-10-CM | POA: Diagnosis not present

## 2020-12-06 DIAGNOSIS — Z5112 Encounter for antineoplastic immunotherapy: Secondary | ICD-10-CM | POA: Diagnosis not present

## 2020-12-06 DIAGNOSIS — C109 Malignant neoplasm of oropharynx, unspecified: Secondary | ICD-10-CM | POA: Diagnosis not present

## 2020-12-10 DIAGNOSIS — D485 Neoplasm of uncertain behavior of skin: Secondary | ICD-10-CM | POA: Diagnosis not present

## 2020-12-10 DIAGNOSIS — D692 Other nonthrombocytopenic purpura: Secondary | ICD-10-CM | POA: Diagnosis not present

## 2020-12-10 DIAGNOSIS — L821 Other seborrheic keratosis: Secondary | ICD-10-CM | POA: Diagnosis not present

## 2020-12-10 DIAGNOSIS — L820 Inflamed seborrheic keratosis: Secondary | ICD-10-CM | POA: Diagnosis not present

## 2020-12-10 DIAGNOSIS — L570 Actinic keratosis: Secondary | ICD-10-CM | POA: Diagnosis not present

## 2020-12-12 ENCOUNTER — Telehealth: Payer: Self-pay | Admitting: Pharmacist

## 2020-12-12 ENCOUNTER — Ambulatory Visit: Payer: Medicare Other | Admitting: Sports Medicine

## 2020-12-12 NOTE — Chronic Care Management (AMB) (Signed)
Chronic Care Management Pharmacy Assistant   Name: Clinton Ortiz  MRN: [id number] DOB: [birth year]  Clinton Ortiz is an 80 y.o. year old male who presents for his initial CCM visit with the clinical pharmacist.  Reason for Encounter: Initial CCM Visit  Recent office visits:  11/28/20- Aundria Mems, MD- chronic conditions addressed, prescribed humidified oxygen,  denied CPAP, ear canals  irrigated, follow up 2 weeks 08/17/20- Aundria Mems, MD- seen for history of renal cancer status post left nephrectomy, no medication changes, no follow up documented  Recent consult visits:  11/27/20- Loura Back, MD (Nephrology)-seen for follow up of stage 3 CKD, no medication changes, follow up 4 months 11/15/20- Verdell Carmine, MD ( Hematology and Oncology)- seen for follow up of renal cell carcinoma of left kidney, infusion received, no medication changes, follow up 4 weeks 10/04/20- Verdell Carmine, MD ( Hematology and Oncology)- seen for follow up of renal cell carcinoma of left kidney, infusion received, no medication changes, follow up 3 weeks 08/08/20- Tally Due, MD ( General Surgery)- seen for post removal of a right rectus sheath metastatic deposit of renal cell carcinoma complicated by postoperative surgical site infection, no medication changes, no follow up documented 08/06/20- Verdell Carmine, MD ( Hematology and Oncology)- seen for renal cell carcinoma of left kidney, labs ordered, no medication changes, follow up 3 weeks 07/25/20 - Verdell Carmine, MD ( Hematology and Oncology)- seen for hypertension, will rtc for chemotherapy teaching, follow up 2 weeks 06/25/20- Robynn Pane, PA ( Hematology and Oncology)- seen for renal cell carcinoma of left kidney, reviewed biopsy and recommended surgical management, referral to general surgery, follow up 4 weeks      06/15/20- CT guided biopsy for renal cell carcinoma of left kidney  Hospital visits:  [facility] Medical Surgical  Department- Abdominal wall abscess, no content documented  Medications: Outpatient Encounter Medications as of 12/12/2020  Medication Sig   Albuterol Sulfate (PROAIR RESPICLICK) 536 (90 BASE) MCG/ACT AEPB Inhale 2 puffs into the lungs every 4 (four) hours as needed. (Patient taking differently: Inhale 2 puffs into the lungs every 4 (four) hours as needed (shortness of breath).)   AMBULATORY NON FORMULARY MEDICATION Humidifier to be used with O2.  Fax to [facility] f: [phone]   AMBULATORY NON FORMULARY MEDICATION Medication Name: Wound care supplies as needed.   azelastine (ASTELIN) 0.1 % nasal spray SPRAY 2 SPRAYS INTO EACH NOSTRIL TWICE A DAY AS DIRECTED (Patient taking differently: Place 2 sprays into both nostrils daily as needed for rhinitis or allergies.)   Continuous Blood Gluc Receiver (FREESTYLE LIBRE 2 READER) DEVI 1 each by Does not apply route in the morning, at noon, in the evening, and at bedtime.   Continuous Blood Gluc Sensor (FREESTYLE LIBRE 14 DAY SENSOR) MISC 1 APPLICATION BY DOES NOT APPLY ROUTE EVERY 14 (FOURTEEN) DAYS. APPLY UPPER DELTOID EVERY 14 DAYS, USE READER TO DETERMINE BLOOD SUGARS   Dulaglutide (TRULICITY) 4.5 QP/6.1PJ SOPN Inject 4.5 mg into the skin every Monday.   ELIQUIS 5 MG TABS tablet TAKE 1 TABLET BY MOUTH TWICE A DAY   furosemide (LASIX) 40 MG tablet TAKE 1 TABLET BY MOUTH EVERY DAY (Patient taking differently: Take 40 mg by mouth as needed for fluid or edema.)   gabapentin (NEURONTIN) 300 MG capsule TAKE 1 CAPSULE BY MOUTH TWICE A DAY   HYDROcodone-acetaminophen (NORCO/VICODIN) 5-325 MG tablet Take 1 tablet by mouth every 8 (eight) hours as needed for moderate pain.   INLYTA 5 MG tablet Take by  mouth.   metoCLOPramide (REGLAN) 10 MG tablet 1 tab PO TID prn Nausea   metoprolol tartrate (LOPRESSOR) 25 MG tablet TAKE 1 TABLET BY MOUTH TWICE A DAY   nitroGLYCERIN (NITROSTAT) 0.4 MG SL tablet Place 1 tablet (0.4 mg total) under the tongue every 5 (five)  minutes as needed for chest pain.   ondansetron (ZOFRAN-ODT) 8 MG disintegrating tablet Take 1 tablet (8 mg total) by mouth every 8 (eight) hours as needed for nausea.   pantoprazole (PROTONIX) 40 MG tablet TAKE 1 TABLET BY MOUTH EVERY DAY   polyethylene glycol powder (GLYCOLAX/MIRALAX) 17 GM/SCOOP powder Take 0.5 Containers by mouth daily as needed for mild constipation or moderate constipation.    pravastatin (PRAVACHOL) 80 MG tablet TAKE 1 TABLET BY MOUTH EVERY DAY   sertraline (ZOLOFT) 50 MG tablet Take 1 tablet (50 mg total) by mouth daily.   tamsulosin (FLOMAX) 0.4 MG CAPS capsule TAKE 2 CAPSULES BY MOUTH EVERY DAY   Thiamine HCl (VITAMIN B-1 PO) Take by mouth.   TRELEGY ELLIPTA 100-62.5-25 MCG/INH AEPB INHALE 1 PUFF BY MOUTH EVERY DAY   triamcinolone (NASACORT) 55 MCG/ACT AERO nasal inhaler PLACE 2 SPRAYS INTO THE NOSE DAILY. (Patient taking differently: Place 2 sprays into the nose daily as needed (congestion).)   VITAMIN D PO Take by mouth. Vitamin D3   No facility-administered encounter medications on file as of 12/12/2020.    Current Documented Medications Albuterol Sulfate 108 MCG/ACT AEPB azelastine  0.1 % nasal spray Dulaglutide 4.5 MG/0.5ML SOPN- 28 DS last filled 11/19/20 ELIQUIS 5 MG- 30 DS last filled 12/11/20 furosemide 40 MG- 90 DS last filled 04/26/20 gabapentin 300 MG - 45 DS last filled 11/01/20 HYDROcodone-acetaminophen  5-325 MG- 10 DS last filled 11/16/20 INLYTA 5 MG- 30 DS last filled 11/21/20 metoCLOPramide 10 MG - 10 DS last filled 09/05/20 metoprolol tartrate 25 MG- 30 DS last filled 08/13/20 nitroGLYCERIN  0.4 MG SL- 08/17/20 ondansetron 8 MG- 6 DS last filled 11/28/20 pantoprazole  40 MG- 90 DS last filled 10/09/20 polyethylene glycol powder  17 GM/SCOOP powder pravastatin 80 MG- 90 DS last filled 11/01/20 sertraline  50 MG- 90 DS last filled 10/09/20 tamsulosin 0.4 MG - 90 DS last filled 10/09/20 Thiamine HCl  TRELEGY ELLIPTA 100-62.5-25 MCG/INH  AEPB triamcinolone 55 MCG/ACT AERO nasal inhaler

## 2020-12-19 ENCOUNTER — Ambulatory Visit: Payer: Medicare Other

## 2020-12-19 ENCOUNTER — Ambulatory Visit: Payer: Medicare Other | Admitting: Sports Medicine

## 2020-12-21 ENCOUNTER — Telehealth: Payer: Self-pay | Admitting: Pharmacist

## 2020-12-21 NOTE — Chronic Care Management (AMB) (Signed)
Chronic Care Management Pharmacy Assistant   Name: Brayden Brodhead  MRN: [id number] DOB: 1940-05-16  Clinton Ortiz is an 80 y.o. year old male who presents for his initial CCM visit with the clinical pharmacist.   Recent office visits:  11/28/20-Thomas Dianah Field, MD (PCP) Seen for Cerumen impaction of the left and right ears. Samples given of Trelegy 200 mg. Patient refuses CPAP. Follow up in 2 weeks. 08/17/20-Thomas Thekkekandam (PCP) Follow up.  Recent consult visits:  12/06/20-Thomas Rondel Baton (Hematology) Notes not available. 11/27/20-Gregory Gillian Shields (Nephrology) Follow up visit. Follow up in 4 months. 11/15/20-Judith O. Hopkins (Hematology) Follow up visit. Infusion Assessment, Patient here to receive Keytruda and 1L of normal saline over 2 hours. 10/26/20-Joshua S. Rickey (General Surgery) Notes not available. 10/25/20-Angela Vella Kohler (Hematology) Notes not available. 10/04/20-Judith Kennith Center, MD (Hematology) Follow up visit. Infusion assessment to receive Keytruda.  10/03/20-Gregory Scharlene Gloss (Nephrology) Notes not available. 09/13/20-Kefal Leonel Ramsay (Hematology) Notes not available. 08/28/20-Anita Anthonette Legato (Hematology) Notes not available. 08/23/20-[facility] (Neurology) Notes not available. 08/08/20-Joshua S. Rickey (General surgery)  08/06/20-Saej Mincy (Hematology) Follow up visit. Chemotherapy infusion. 07/25/20-Judith Carmela Rima (Hematology) Follow up visit. 07/23/20-Joshua S. Rickey (General Surgery) Notes not available. 06/28/20-Joshue S. Rickey (General Surgery) Notes not available. 06/25/20-Thomas Rondel Baton (Hematology) Ambulatory Referral to General Surgery . Follow up in 4 weeks.  Hospital visits:  None in previous 6 months  Medications: Outpatient Encounter Medications as of 12/21/2020  Medication Sig   Albuterol Sulfate (PROAIR RESPICLICK) 160 (90 BASE) MCG/ACT AEPB Inhale 2 puffs into the lungs every 4 (four)  hours as needed. (Patient taking differently: Inhale 2 puffs into the lungs every 4 (four) hours as needed (shortness of breath).)   AMBULATORY NON FORMULARY MEDICATION Humidifier to be used with O2.  Fax to [facility] f: [phone]   AMBULATORY NON FORMULARY MEDICATION Medication Name: Wound care supplies as needed.   azelastine (ASTELIN) 0.1 % nasal spray SPRAY 2 SPRAYS INTO EACH NOSTRIL TWICE A DAY AS DIRECTED (Patient taking differently: Place 2 sprays into both nostrils daily as needed for rhinitis or allergies.)   Continuous Blood Gluc Receiver (FREESTYLE LIBRE 2 READER) DEVI 1 each by Does not apply route in the morning, at noon, in the evening, and at bedtime.   Continuous Blood Gluc Sensor (FREESTYLE LIBRE 14 DAY SENSOR) MISC 1 APPLICATION BY DOES NOT APPLY ROUTE EVERY 14 (FOURTEEN) DAYS. APPLY UPPER DELTOID EVERY 14 DAYS, USE READER TO DETERMINE BLOOD SUGARS   Dulaglutide (TRULICITY) 4.5 WN/4.6EV SOPN Inject 4.5 mg into the skin every Monday.   ELIQUIS 5 MG TABS tablet TAKE 1 TABLET BY MOUTH TWICE A DAY   furosemide (LASIX) 40 MG tablet TAKE 1 TABLET BY MOUTH EVERY DAY (Patient taking differently: Take 40 mg by mouth as needed for fluid or edema.)   gabapentin (NEURONTIN) 300 MG capsule TAKE 1 CAPSULE BY MOUTH TWICE A DAY   HYDROcodone-acetaminophen (NORCO/VICODIN) 5-325 MG tablet Take 1 tablet by mouth every 8 (eight) hours as needed for moderate pain.   INLYTA 5 MG tablet Take by mouth.   metoCLOPramide (REGLAN) 10 MG tablet 1 tab PO TID prn Nausea   metoprolol tartrate (LOPRESSOR) 25 MG tablet TAKE 1 TABLET BY MOUTH TWICE A DAY   nitroGLYCERIN (NITROSTAT) 0.4 MG SL tablet Place 1 tablet (0.4 mg total) under the tongue every 5 (five) minutes as needed for chest pain.   ondansetron (ZOFRAN-ODT) 8 MG disintegrating tablet Take 1 tablet (8 mg total) by mouth  every 8 (eight) hours as needed for nausea.   pantoprazole (PROTONIX) 40 MG tablet TAKE 1 TABLET BY MOUTH EVERY DAY   polyethylene  glycol powder (GLYCOLAX/MIRALAX) 17 GM/SCOOP powder Take 0.5 Containers by mouth daily as needed for mild constipation or moderate constipation.    pravastatin (PRAVACHOL) 80 MG tablet TAKE 1 TABLET BY MOUTH EVERY DAY   sertraline (ZOLOFT) 50 MG tablet Take 1 tablet (50 mg total) by mouth daily.   tamsulosin (FLOMAX) 0.4 MG CAPS capsule TAKE 2 CAPSULES BY MOUTH EVERY DAY   Thiamine HCl (VITAMIN B-1 PO) Take by mouth.   TRELEGY ELLIPTA 100-62.5-25 MCG/INH AEPB INHALE 1 PUFF BY MOUTH EVERY DAY   triamcinolone (NASACORT) 55 MCG/ACT AERO nasal inhaler PLACE 2 SPRAYS INTO THE NOSE DAILY. (Patient taking differently: Place 2 sprays into the nose daily as needed (congestion).)   VITAMIN D PO Take by mouth. Vitamin D3   No facility-administered encounter medications on file as of 12/21/2020.   Albuterol Sulfate (PROAIR RESPICLICK) 132 (90 BASE) MCG/ACT AEPB Last filled:None noted Azelastine (ASTELIN) 0.1 % nasal spray Last filled:10/21/18 30 DS Dulaglutide (TRULICITY) 4.5 GM/0.1UU SOPN Last filled:12/21/20 28 DS ELIQUIS 5 MG TABS tablet Last filled:12/11/20 30 DS Furosemide (LASIX) 40 MG tablet Last filled:04/26/20 90 DS Gabapentin (NEURONTIN) 300 MG capsule Last filled:11/01/20 45 DS HYDROcodone-acetaminophen (NORCO/VICODIN) 5-325 MG tablet Last filled:11/16/20 10 DS INLYTA 5 MG tablet Last filled:None noted MetoCLOPramide (REGLAN) 10 MG tablet Last filled:09/05/20 10 DS Metoprolol tartrate (LOPRESSOR) 25 MG tablet Last filled:08/13/20 30 DS NitroGLYCERIN (NITROSTAT) 0.4 MG SL tablet Last filled:08/17/20 1 DS Ondansetron (ZOFRAN-ODT) 8 MG disintegrating tablet Last filled:12/20/20 6 DS Pantoprazole (PROTONIX) 40 MG tablet Last filled:10/09/20 90 DS Polyethylene glycol powder (GLYCOLAX/MIRALAX) 17 GM/SCOOP powder Last filled:None noted Pravastatin (PRAVACHOL) 80 MG tablet Last filled:11/01/20 90 DS Sertraline (ZOLOFT) 50 MG tablet Last filled:10/09/20 90 DS Tamsulosin (FLOMAX) 0.4 MG CAPS capsule  Last filled:10/09/20 90 DS Thiamine HCl (VITAMIN B-1 PO) Last filled:None noted TRELEGY ELLIPTA 100-62.5-25 MCG/INH AEPB Last filled:None noted Triamcinolone (NASACORT) 55 MCG/ACT AERO nasal inhaler Last filled:11/25/19 30 DS   Star Rating Drugs: Pravastatin (PRAVACHOL) 80 MG tablet Last filled:11/01/20 90 DS Dulaglutide (TRULICITY) 4.5 VO/5.3GU SOPN Last filled:12/21/20 28 DS  Myriam Elta [place], [place]

## 2020-12-27 DIAGNOSIS — I129 Hypertensive chronic kidney disease with stage 1 through stage 4 chronic kidney disease, or unspecified chronic kidney disease: Secondary | ICD-10-CM | POA: Diagnosis not present

## 2020-12-27 DIAGNOSIS — E032 Hypothyroidism due to medicaments and other exogenous substances: Secondary | ICD-10-CM | POA: Diagnosis not present

## 2020-12-27 DIAGNOSIS — T451X5A Adverse effect of antineoplastic and immunosuppressive drugs, initial encounter: Secondary | ICD-10-CM | POA: Diagnosis not present

## 2020-12-27 DIAGNOSIS — C642 Malignant neoplasm of left kidney, except renal pelvis: Secondary | ICD-10-CM | POA: Diagnosis not present

## 2020-12-27 DIAGNOSIS — N1832 Chronic kidney disease, stage 3b: Secondary | ICD-10-CM | POA: Diagnosis not present

## 2020-12-27 DIAGNOSIS — C109 Malignant neoplasm of oropharynx, unspecified: Secondary | ICD-10-CM | POA: Diagnosis not present

## 2020-12-27 DIAGNOSIS — I10 Essential (primary) hypertension: Secondary | ICD-10-CM | POA: Diagnosis not present

## 2020-12-27 DIAGNOSIS — Z1379 Encounter for other screening for genetic and chromosomal anomalies: Secondary | ICD-10-CM | POA: Diagnosis not present

## 2020-12-27 DIAGNOSIS — E119 Type 2 diabetes mellitus without complications: Secondary | ICD-10-CM | POA: Diagnosis not present

## 2020-12-27 DIAGNOSIS — C787 Secondary malignant neoplasm of liver and intrahepatic bile duct: Secondary | ICD-10-CM | POA: Diagnosis not present

## 2020-12-27 DIAGNOSIS — E1122 Type 2 diabetes mellitus with diabetic chronic kidney disease: Secondary | ICD-10-CM | POA: Diagnosis not present

## 2020-12-27 DIAGNOSIS — E860 Dehydration: Secondary | ICD-10-CM | POA: Diagnosis not present

## 2020-12-27 DIAGNOSIS — Z6837 Body mass index (BMI) 37.0-37.9, adult: Secondary | ICD-10-CM | POA: Diagnosis not present

## 2020-12-27 DIAGNOSIS — Z5112 Encounter for antineoplastic immunotherapy: Secondary | ICD-10-CM | POA: Diagnosis not present

## 2020-12-27 DIAGNOSIS — I25810 Atherosclerosis of coronary artery bypass graft(s) without angina pectoris: Secondary | ICD-10-CM | POA: Diagnosis not present

## 2020-12-31 ENCOUNTER — Encounter: Payer: Self-pay | Admitting: Sports Medicine

## 2020-12-31 ENCOUNTER — Other Ambulatory Visit: Payer: Self-pay

## 2020-12-31 ENCOUNTER — Other Ambulatory Visit: Payer: Self-pay | Admitting: Sports Medicine

## 2020-12-31 ENCOUNTER — Ambulatory Visit (INDEPENDENT_AMBULATORY_CARE_PROVIDER_SITE_OTHER): Payer: Medicare Other | Admitting: Sports Medicine

## 2020-12-31 DIAGNOSIS — I2510 Atherosclerotic heart disease of native coronary artery without angina pectoris: Secondary | ICD-10-CM

## 2020-12-31 DIAGNOSIS — E1121 Type 2 diabetes mellitus with diabetic nephropathy: Secondary | ICD-10-CM | POA: Diagnosis not present

## 2020-12-31 DIAGNOSIS — J440 Chronic obstructive pulmonary disease with acute lower respiratory infection: Secondary | ICD-10-CM | POA: Diagnosis not present

## 2020-12-31 DIAGNOSIS — G4733 Obstructive sleep apnea (adult) (pediatric): Secondary | ICD-10-CM | POA: Diagnosis not present

## 2020-12-31 MED ORDER — TRELEGY ELLIPTA 200-62.5-25 MCG/INH IN AEPB: INHALATION_SPRAY | RESPIRATORY_TRACT | 11 refills | Status: DC

## 2020-12-31 MED ORDER — PROAIR RESPICLICK 108 (90 BASE) MCG/ACT IN AEPB: 2.0000 | INHALATION_SPRAY | RESPIRATORY_TRACT | 11 refills | Status: DC | PRN

## 2020-12-31 MED ORDER — FREESTYLE LIBRE 2 SENSOR MISC: 0 refills | Status: DC

## 2020-12-31 NOTE — Assessment & Plan Note (Signed)
Clinton Ortiz is doing overall really well with regards to his diabetes, he needs a new prescription for the freestyle libre 2 sensor.

## 2020-12-31 NOTE — Assessment & Plan Note (Signed)
We did some Trelegy 200 mg samples at the last visit and Garris has done extremely well, his cough is for the most part gone, calling in his new dose.

## 2020-12-31 NOTE — Assessment & Plan Note (Signed)
Clinton Ortiz has declined CPAP, he does have humidified oxygen which seems to have helped his symptoms as well.

## 2020-12-31 NOTE — Progress Notes (Signed)
    Procedures performed today:    None.  Independent interpretation of notes and tests performed by another provider:   None.  Brief History, Exam, Impression, and Recommendations:    COPD (chronic obstructive pulmonary disease) (HCC) We did some Trelegy 200 mg samples at the last visit and Clinton Ortiz has done extremely well, his cough is for the most part gone, calling in his new dose.  Obstructive sleep apnea Clinton Ortiz has declined CPAP, he does have humidified oxygen which seems to have helped his symptoms as well.  Controlled type 2 diabetes mellitus with diabetic nephropathy ([place]) Clinton Ortiz is doing overall really well with regards to his diabetes, he needs a new prescription for the freestyle libre 2 sensor.    ___________________________________________ Gwen Her. Dianah Field, M.D., ABFM., CAQSM. Primary Care and [place] [employer] of [employer] [facility] [employer]

## 2021-01-02 ENCOUNTER — Telehealth: Payer: Medicare Other

## 2021-01-02 ENCOUNTER — Telehealth: Payer: Self-pay | Admitting: Pharmacist

## 2021-01-02 NOTE — Telephone Encounter (Signed)
Patient's appointment has been cancelled to avoid no show fee. AM

## 2021-01-02 NOTE — Chronic Care Management (AMB) (Signed)
  Care Management   Note  01/02/2021 Name: Clinton Ortiz MRN: [id number] DOB: 02/08/1941  Clinton Ortiz is a 80 y.o. year old male who is a primary care patient of Dianah Field, Gwen Her, MD and is actively engaged with the care management team. I reached out to [name] by phone today to assist with re-scheduling an initial visit with the Pharmacist  Follow up plan: Unsuccessful telephone outreach attempt made. A HIPAA compliant phone message was left for the patient providing contact information and requesting a return call.   Julian Hy, [place] Management  Direct Dial: [phone]

## 2021-01-02 NOTE — Telephone Encounter (Signed)
Appears that patient had initial appt onsite scheduled 12/19/20 which was rescheduled to phone appt for 01/02/21 for CCM services with pharmacist.  Attempt x 2, unsuccessful outreach, left VM.  Routing to schedule team for reschedule.  Darius Bump

## 2021-01-02 NOTE — Progress Notes (Deleted)
Current Barriers:  {PHARMCACYBARRIERS:21091514}  Pharmacist Clinical Goal(s):  Over the next *** days, patient will {PHARMACYGOALCHOICES:25079} through collaboration with PharmD and provider.   Interventions: 1:1 collaboration with Silverio Decamp, MD regarding development and update of comprehensive plan of care as evidenced by provider attestation and co-signature Inter-disciplinary care team collaboration (see longitudinal plan of care) Comprehensive medication review performed; medication list updated in electronic medical record  Diabetes:  Uncontrolled/controlled; current treatment:trulicity; Z9D 6.2  Current glucose readings: fasting glucose: ***, post prandial glucose: ***  Denies/reports hypoglycemic/hyperglycemic symptoms  Current meal patterns: breakfast: ***; lunch: ***; dinner: ***; snacks: ***; drinks: ***  Current exercise: ***  {PHARMACYINTERVENTION:21091513},  Hypertension:  Uncontrolled/controlled; current treatment:metoprolol 25mg  BID;   Current home readings:   Denies/reports hypotensive/hypertensive symptoms  {PHARMACYINTERVENTION:21091513},  Hyperlipidemia:  Uncontrolled/controlled; current treatment: pravastatin 80mg  daily;   Medications previously tried: ***   Current dietary patterns: ***  {PHARMACYINTERVENTION:21091513} Atrial Fibrillation:  Uncontrolled/controlled; current rate/rhythm control: metoprolol 25mg  BID; anticoagulant treatment: elqiuis 5mg  BID  CHADS2VASc score:   Home blood pressure, heart rate readings:   {PHARMACYINTERVENTION:21091513}  Patient Goals/Self-Care Activities Over the next *** days, patient will:  {PHARMACYPATIENTGOALS:25081}  Follow Up Plan: {CM FOLLOW UP JTTS:17793} ***      Chronic Care Management Pharmacy Note  01/02/2021 Name:  Clinton Ortiz MRN:  [id number] DOB:  1940/05/29  Summary:  Recommendations/Changes made from today's visit:  Plan:  Subjective: Clinton Ortiz is an 80 y.o. year old male who is a  primary patient of Thekkekandam, Gwen Her, MD.  The CCM team was consulted for assistance with disease management and care coordination needs.    {CCMTELEPHONEFACETOFACE:21091510} for {CCMINITIALFOLLOWUPCHOICE:21091511} in response to provider referral for pharmacy case management and/or care coordination services.   Consent to Services:  {CCMCONSENTOPTIONS:25074}  Patient Care Team: Silverio Decamp, MD as PCP - General (Family Medicine) Stanford Breed Denice Bors, MD as PCP - Cardiology (Cardiology) Belva Crome, MD as Consulting Physician (Cardiology) Darius Bump, [facility] as Pharmacist (Pharmacist)  Recent office visits:  11/28/20- Aundria Mems, MD- chronic conditions addressed, prescribed humidified oxygen,  denied CPAP, ear canals  irrigated, follow up 2 weeks 08/17/20- Aundria Mems, MD- seen for history of renal cancer status post left nephrectomy, no medication changes, no follow up documented   Recent consult visits:  11/27/20- Loura Back, MD (Nephrology)-seen for follow up of stage 3 CKD, no medication changes, follow up 4 months 11/15/20- Verdell Carmine, MD ( Hematology and Oncology)- seen for follow up of renal cell carcinoma of left kidney, infusion received, no medication changes, follow up 4 weeks 10/04/20- Verdell Carmine, MD ( Hematology and Oncology)- seen for follow up of renal cell carcinoma of left kidney, infusion received, no medication changes, follow up 3 weeks 08/08/20- Tally Due, MD ( General Surgery)- seen for post removal of a right rectus sheath metastatic deposit of renal cell carcinoma complicated by postoperative surgical site infection, no medication changes, no follow up documented 08/06/20- Verdell Carmine, MD ( Hematology and Oncology)- seen for renal cell carcinoma of left kidney, labs ordered, no medication changes, follow up 3 weeks 07/25/20 - Verdell Carmine, MD ( Hematology and Oncology)- seen for hypertension, will rtc for  chemotherapy teaching, follow up 2 weeks 06/25/20- Robynn Pane, PA ( Hematology and Oncology)- seen for renal cell carcinoma of left kidney, reviewed biopsy and recommended surgical management, referral to general surgery, follow up 4 weeks      06/15/20- CT guided biopsy for renal cell carcinoma of left kidney   Hospital visits:  07/26/20- [facility] Medical Surgical Department- Abdominal wall abscess, no content documented  Objective:  Lab Results  Component Value Date   CREATININE 2.79 (H) 08/29/2019   CREATININE 2.60 (H) 07/19/2019   CREATININE 2.60 (H) 07/18/2019    Lab Results  Component Value Date   HGBA1C 6.7 (A) 11/09/2019   Last diabetic Eye exam:  Lab Results  Component Value Date/Time   HMDIABEYEEXA No Retinopathy 06/20/2015 11:23 AM    Last diabetic Foot exam: No results found for: HMDIABFOOTEX      Component Value Date/Time   CHOL 146 07/01/2019 0318   TRIG 270 (H) 07/01/2019 0318   HDL 24 (L) 07/01/2019 0318   CHOLHDL 6.1 07/01/2019 0318   VLDL 54 (H) 07/01/2019 0318   LDLCALC 68 07/01/2019 0318   LDLCALC 84 10/19/2018 0805    Hepatic Function Latest Ref Rng & Units 07/19/2019 07/18/2019 07/17/2019  Total Protein 6.5 - 8.1 g/dL 5.6(L) 5.6(L) 5.5(L)  Albumin 3.5 - 5.0 g/dL 2.6(L) 2.6(L) 2.6(L)  AST 15 - 41 U/L $Remo'28 29 31  'DXOSC$ ALT 0 - 44 U/L $Remo'26 25 25  'lLIyh$ Alk Phosphatase 38 - 126 U/L 63 63 64  Total Bilirubin 0.3 - 1.2 mg/dL 1.0 1.0 0.9    Lab Results  Component Value Date/Time   TSH 7.010 (H) 09/09/2019 09:39 AM   TSH 4.72 (H) 10/19/2018 08:05 AM   TSH 3.42 06/04/2017 11:47 AM   FREET4 1.18 (H) 09/09/2019 11:47 AM   FREET4 1.0 10/19/2018 08:05 AM    CBC Latest Ref Rng & Units 08/29/2019 07/19/2019 07/18/2019  WBC 4.0 - 10.5 K/uL 8.5 6.3 6.3  Hemoglobin 13.0 - 17.0 g/dL 13.2 9.9(L) 9.6(L)  Hematocrit 39.0 - 52.0 % 44.2 29.5(L) 28.6(L)  Platelets 150 - 400 K/uL 348 222 175    No results found for: VD25OH  Clinical ASCVD: {YES/NO:21197} The 10-year ASCVD risk  score Mikey Bussing DC Jr., et al., 2013) is: 69.2%   Values used to calculate the score:     Age: 69 years     Sex: Male     Is Non-Hispanic African American: No     Diabetic: Yes     Tobacco smoker: No     Systolic Blood Pressure: 718 mmHg     Is BP treated: Yes     HDL Cholesterol: 24 mg/dL     Total Cholesterol: 146 mg/dL    Other: (CHADS2VASc if Afib, PHQ9 if depression, MMRC or CAT for COPD, ACT, DEXA)  Social History   Tobacco Use  Smoking Status Former   Packs/day: 1.00   Years: 2.00   Pack years: 2.00   Types: Cigarettes  Smokeless Tobacco Never  Tobacco Comments   smoked 3 years as a teenager   BP Readings from Last 3 Encounters:  12/31/20 (!) 144/94  11/28/20 133/89  08/17/20 113/68   Pulse Readings from Last 3 Encounters:  12/31/20 61  11/28/20 72  08/17/20 74   Wt Readings from Last 3 Encounters:  12/31/20 201 lb (91.2 kg)  11/28/20 207 lb (93.9 kg)  08/17/20 218 lb (98.9 kg)    Assessment: Review of patient past medical history, allergies, medications, health status, including review of consultants reports, laboratory and other test data, was performed as part of comprehensive evaluation and provision of chronic care management services.   SDOH:  (Social Determinants of Health) assessments and interventions performed:    CCM Care Plan  Allergies  Allergen Reactions   Flavoring Agent Other (See Comments)    makes him  feel funny    Medications Reviewed Today     Reviewed by Dema Severin, CMA (Certified Medical Assistant) on 12/31/20 at 1453  Med List Status: <None>   Medication Order Taking? Sig Documenting Provider Last Dose Status Informant  Albuterol Sulfate (PROAIR RESPICLICK) 086 (90 BASE) MCG/ACT AEPB [id number] Yes Inhale 2 puffs into the lungs every 4 (four) hours as needed.  Patient taking differently: Inhale 2 puffs into the lungs every 4 (four) hours as needed (shortness of breath).   Donella Stade, PA-C Taking Active   AMBULATORY  NON FORMULARY MEDICATION [id number] Yes Humidifier to be used with O2.  Fax to [facility] f: [phone] Silverio Decamp, MD Taking Active   Camillo Flaming MEDICATION [id number] Yes Medication Name: Wound care supplies as needed. Silverio Decamp, MD Taking Active   azelastine (ASTELIN) 0.1 % nasal spray [id number] Yes SPRAY 2 SPRAYS INTO EACH NOSTRIL TWICE A DAY AS DIRECTED  Patient taking differently: Place 2 sprays into both nostrils daily as needed for rhinitis or allergies.   Silverio Decamp, MD Taking Active   Continuous Blood Gluc Receiver (FREESTYLE LIBRE 2 READER) DEVI [id number] Yes 1 each by Does not apply route in the morning, at noon, in the evening, and at bedtime. Silverio Decamp, MD Taking Active   Continuous Blood Gluc Sensor (FREESTYLE LIBRE 14 DAY SENSOR) [place] [id number] Yes 1 APPLICATION BY DOES NOT APPLY ROUTE EVERY 14 (FOURTEEN) DAYS. APPLY UPPER DELTOID EVERY 14 DAYS, USE READER TO DETERMINE BLOOD SUGARS Silverio Decamp, MD Taking Active   Dulaglutide (TRULICITY) 4.5 WF/0.9NA SOPN [id number] Yes Inject 4.5 mg into the skin every Monday. Silverio Decamp, MD Taking Active   ELIQUIS 5 MG TABS tablet [id number] Yes TAKE 1 TABLET BY MOUTH TWICE A DAY Silverio Decamp, MD Taking Active   furosemide (LASIX) 40 MG tablet [id number] Yes TAKE 1 TABLET BY MOUTH EVERY DAY  Patient taking differently: Take 40 mg by mouth as needed for fluid or edema.   Silverio Decamp, MD Taking Active   gabapentin (NEURONTIN) 300 MG capsule [id number] Yes TAKE 1 CAPSULE BY MOUTH TWICE A DAY Silverio Decamp, MD Taking Active   HYDROcodone-acetaminophen (NORCO/VICODIN) 5-325 MG tablet [id number] Yes Take 1 tablet by mouth every 8 (eight) hours as needed for moderate pain. Silverio Decamp, MD Taking Active   INLYTA 5 MG tablet [id number] Yes Take by mouth. [provider] Taking Active   metoCLOPramide (REGLAN) 10 MG tablet  [id number] Yes 1 tab PO TID prn Nausea Silverio Decamp, MD Taking Active   metoprolol tartrate (LOPRESSOR) 25 MG tablet [id number] Yes TAKE 1 TABLET BY MOUTH TWICE A DAY Silverio Decamp, MD Taking Active   nitroGLYCERIN (NITROSTAT) 0.4 MG SL tablet [id number] Yes Place 1 tablet (0.4 mg total) under the tongue every 5 (five) minutes as needed for chest pain. Silverio Decamp, MD Taking Active   ondansetron (ZOFRAN-ODT) 8 MG disintegrating tablet [id number] Yes Take 1 tablet (8 mg total) by mouth every 8 (eight) hours as needed for nausea. Silverio Decamp, MD Taking Active   pantoprazole (PROTONIX) 40 MG tablet [id number] Yes TAKE 1 TABLET BY MOUTH EVERY DAY Silverio Decamp, MD Taking Active   polyethylene glycol powder (GLYCOLAX/MIRALAX) 17 GM/SCOOP powder [id number] Yes Take 0.5 Containers by mouth daily as needed for mild constipation or moderate constipation.  [provider] Taking Active Child  pravastatin (PRAVACHOL) 80 MG tablet [id number] Yes TAKE 1 TABLET BY MOUTH  EVERY DAY Silverio Decamp, MD Taking Active   sertraline (ZOLOFT) 50 MG tablet [id number] Yes Take 1 tablet (50 mg total) by mouth daily. Silverio Decamp, MD Taking Active   tamsulosin [facility]) 0.4 MG CAPS capsule [id number] Yes TAKE 2 CAPSULES BY MOUTH EVERY DAY Silverio Decamp, MD Taking Active   Thiamine HCl (VITAMIN B-1 PO) [id number] Yes Take by mouth. [provider] Taking Active   TRELEGY ELLIPTA 100-62.5-25 MCG/INH AEPB [id number] Yes INHALE 1 PUFF BY MOUTH EVERY DAY Silverio Decamp, MD Taking Active   triamcinolone (NASACORT) 55 MCG/ACT AERO nasal inhaler [id number] Yes PLACE 2 SPRAYS INTO THE NOSE DAILY.  Patient taking differently: Place 2 sprays into the nose daily as needed (congestion).   Silverio Decamp, MD Taking Active   VITAMIN D PO [id number] Yes Take by mouth. Vitamin D3 [provider] Taking Active             Patient  Active Problem List   Diagnosis Date Noted   Obstructive sleep apnea 11/28/2020   Acquired elevated hemidiaphragm 09/02/2019   Secondary hypercoagulable state ([place]) 08/29/2019   Atrial fibrillation ([place]) 08/24/2019   Insomnia secondary to situational depression 08/19/2019   S/P CABG x 4 07/12/2019   Left main coronary artery disease 07/01/2019   Right foot injury 06/15/2019   Subclinical hypothyroidism 10/20/2018   Plantar fasciitis, bilateral 05/07/2018   Sepsis due to urinary tract infection ([place]) 06/22/2017   CKD stage 3 due to type 2 diabetes mellitus and post unilateral nephrectomy for renal cell carcinoma 06/05/2017   LPRD (laryngopharyngeal reflux disease) 06/04/2017   Mass of right side of neck 09/13/2014   COPD (chronic obstructive pulmonary disease) ([place]) 06/01/2014   Controlled type 2 diabetes mellitus with diabetic nephropathy ([place]) 12/30/2013   Benign prostatic hyperplasia 12/28/2013   Morbid obesity ([place]) 12/28/2013   Annual physical exam 02/14/2013   Hypertension 09/02/2012   Hyperlipidemia 09/02/2012   History of renal cancer status post left nephrectomy 09/02/2012   Swelling, scrotum 09/02/2012   Lumbar degenerative disc disease 12/29/2011    Immunization History  Administered Date(s) Administered   Fluad Quad(high Dose 65+) 01/18/2019, 06/08/2020   Influenza, High Dose Seasonal PF 04/30/2017, 02/08/2018   Influenza,inj,Quad PF,6+ Mos 02/14/2013, 02/02/2014, 06/07/2015, 02/29/2016   PFIZER(Purple Top)SARS-COV-2 Vaccination 05/31/2019, 07/26/2019   Pneumococcal Conjugate-13 09/13/2014   Pneumococcal Polysaccharide-23 11/29/2015   Tdap 02/14/2013    Conditions to be addressed/monitored: {CCM ASSESSMENT DISEASE OPTIONS:25047}  There are no care plans that you recently modified to display for this patient.   Medication Assistance: {MEDASSISTANCEINFO:25044}  Patient's preferred pharmacy is:  [id number] - [place], [place] [address] Phone: [phone] Fax: [phone]  Uses pill box? {Yes or If no, why not?:20788} Pt endorses ***% compliance  Follow Up:  {FOLLOWUP:24991}  Plan: {CM FOLLOW UP PLAN:25073}  SIG***

## 2021-01-04 NOTE — Chronic Care Management (AMB) (Signed)
  Care Management   Note  01/04/2021 Name: Clinton Ortiz MRN: [id number] DOB: 17-Apr-1941  Clinton Ortiz is a 80 y.o. year old male who is a primary care patient of Dianah Field, Gwen Her, MD and is actively engaged with the care management team. I reached out to [name] by phone today to assist with re-scheduling an initial visit with the Pharmacist  Follow up plan: 2nd attempt Unsuccessful telephone outreach attempt made. A HIPAA compliant phone message was left for the patient providing contact information and requesting a return call.   Julian Hy, [place] Management  Direct Dial: [phone]

## 2021-01-09 ENCOUNTER — Other Ambulatory Visit: Payer: Self-pay | Admitting: Sports Medicine

## 2021-01-09 DIAGNOSIS — F4321 Adjustment disorder with depressed mood: Secondary | ICD-10-CM

## 2021-01-10 NOTE — Chronic Care Management (AMB) (Signed)
  Care Management   Note  01/10/2021 Name: Clinton Ortiz MRN: [id number] DOB: 1941-03-12  Clinton Ortiz is a 80 y.o. year old male who is a primary care patient of Dianah Field, Gwen Her, MD and is actively engaged with the care management team. I reached out to [name] by phone today to assist with re-scheduling an initial visit with the Pharmacist  Follow up plan: We have been unable to make contact with the patient for follow up. The care management team is available to follow up    Julian Hy, [place]: [phone]

## 2021-01-11 ENCOUNTER — Ambulatory Visit: Payer: Medicare Other | Admitting: Medical-Surgical

## 2021-01-16 ENCOUNTER — Ambulatory Visit (INDEPENDENT_AMBULATORY_CARE_PROVIDER_SITE_OTHER): Payer: Medicare Other | Admitting: Sports Medicine

## 2021-01-16 ENCOUNTER — Ambulatory Visit (INDEPENDENT_AMBULATORY_CARE_PROVIDER_SITE_OTHER): Payer: Medicare Other

## 2021-01-16 ENCOUNTER — Other Ambulatory Visit: Payer: Self-pay

## 2021-01-16 VITALS — BP 134/93 | HR 73 | Ht 67.0 in | Wt 199.0 lb

## 2021-01-16 DIAGNOSIS — E1121 Type 2 diabetes mellitus with diabetic nephropathy: Secondary | ICD-10-CM

## 2021-01-16 DIAGNOSIS — I10 Essential (primary) hypertension: Secondary | ICD-10-CM

## 2021-01-16 DIAGNOSIS — I2510 Atherosclerotic heart disease of native coronary artery without angina pectoris: Secondary | ICD-10-CM | POA: Diagnosis not present

## 2021-01-16 DIAGNOSIS — I700 Atherosclerosis of aorta: Secondary | ICD-10-CM | POA: Diagnosis not present

## 2021-01-16 DIAGNOSIS — J440 Chronic obstructive pulmonary disease with acute lower respiratory infection: Secondary | ICD-10-CM

## 2021-01-16 DIAGNOSIS — Z951 Presence of aortocoronary bypass graft: Secondary | ICD-10-CM | POA: Diagnosis not present

## 2021-01-16 DIAGNOSIS — R059 Cough, unspecified: Secondary | ICD-10-CM | POA: Diagnosis not present

## 2021-01-16 DIAGNOSIS — Z0000 Encounter for general adult medical examination without abnormal findings: Secondary | ICD-10-CM

## 2021-01-16 LAB — POCT GLYCOSYLATED HEMOGLOBIN (HGB A1C): Hemoglobin A1C: 6.2 % — AB (ref 4.0–5.6)

## 2021-01-16 MED ORDER — SHINGRIX 50 MCG/0.5ML IM SUSR: 0.5000 mL | Freq: Once | INTRAMUSCULAR | 0 refills | Status: AC

## 2021-01-16 MED ORDER — TRULICITY 3 MG/0.5ML ~~LOC~~ SOAJ: SUBCUTANEOUS | 11 refills | Status: DC

## 2021-01-16 NOTE — Assessment & Plan Note (Signed)
Under acceptable control for a 80 year old, no change in plan.

## 2021-01-16 NOTE — Assessment & Plan Note (Signed)
Due for Shingrix, sending to his pharmacy.

## 2021-01-16 NOTE — Assessment & Plan Note (Signed)
Has done well with Trelegy, mild persistent cough, we will go ahead and get another chest x-ray today. No evidence of thrush on exam today. Chest is clear to auscultation.

## 2021-01-16 NOTE — Assessment & Plan Note (Signed)
Diabetes is under excellent control with Trulicity 4.5 mg, he is having some low blood sugar warnings, today A1c is 6.2%, we will go ahead and decrease Trulicity to 3 mg and recheck in 3 months.

## 2021-01-16 NOTE — Progress Notes (Signed)
    Procedures performed today:    None.  Independent interpretation of notes and tests performed by another provider:   None.  Brief History, Exam, Impression, and Recommendations:    Annual physical exam Due for Shingrix, sending to his pharmacy.  Controlled type 2 diabetes mellitus with diabetic nephropathy ([place]) Diabetes is under excellent control with Trulicity 4.5 mg, he is having some low blood sugar warnings, today A1c is 6.2%, we will go ahead and decrease Trulicity to 3 mg and recheck in 3 months.  Hypertension Under acceptable control for a 80 year old, no change in plan.  COPD (chronic obstructive pulmonary disease) (HCC) Has done well with Trelegy, mild persistent cough, we will go ahead and get another chest x-ray today. No evidence of thrush on exam today. Chest is clear to auscultation.    ___________________________________________ Gwen Her. Dianah Field, M.D., ABFM., CAQSM. Primary Care and [place] Instructor of [place] of [facility] of Medicine

## 2021-01-17 DIAGNOSIS — I129 Hypertensive chronic kidney disease with stage 1 through stage 4 chronic kidney disease, or unspecified chronic kidney disease: Secondary | ICD-10-CM | POA: Diagnosis not present

## 2021-01-17 DIAGNOSIS — D751 Secondary polycythemia: Secondary | ICD-10-CM | POA: Diagnosis not present

## 2021-01-17 DIAGNOSIS — C109 Malignant neoplasm of oropharynx, unspecified: Secondary | ICD-10-CM | POA: Diagnosis not present

## 2021-01-17 DIAGNOSIS — I480 Paroxysmal atrial fibrillation: Secondary | ICD-10-CM | POA: Diagnosis not present

## 2021-01-17 DIAGNOSIS — C787 Secondary malignant neoplasm of liver and intrahepatic bile duct: Secondary | ICD-10-CM | POA: Diagnosis not present

## 2021-01-17 DIAGNOSIS — C642 Malignant neoplasm of left kidney, except renal pelvis: Secondary | ICD-10-CM | POA: Diagnosis not present

## 2021-01-17 DIAGNOSIS — Z905 Acquired absence of kidney: Secondary | ICD-10-CM | POA: Diagnosis not present

## 2021-01-17 DIAGNOSIS — Z1379 Encounter for other screening for genetic and chromosomal anomalies: Secondary | ICD-10-CM | POA: Diagnosis not present

## 2021-01-17 DIAGNOSIS — Z85810 Personal history of malignant neoplasm of tongue: Secondary | ICD-10-CM | POA: Diagnosis not present

## 2021-01-17 DIAGNOSIS — I898 Other specified noninfective disorders of lymphatic vessels and lymph nodes: Secondary | ICD-10-CM | POA: Diagnosis not present

## 2021-01-17 DIAGNOSIS — Z79899 Other long term (current) drug therapy: Secondary | ICD-10-CM | POA: Diagnosis not present

## 2021-01-17 DIAGNOSIS — E1122 Type 2 diabetes mellitus with diabetic chronic kidney disease: Secondary | ICD-10-CM | POA: Diagnosis not present

## 2021-01-17 DIAGNOSIS — Z8051 Family history of malignant neoplasm of kidney: Secondary | ICD-10-CM | POA: Diagnosis not present

## 2021-01-17 DIAGNOSIS — E119 Type 2 diabetes mellitus without complications: Secondary | ICD-10-CM | POA: Diagnosis not present

## 2021-01-17 DIAGNOSIS — Z5112 Encounter for antineoplastic immunotherapy: Secondary | ICD-10-CM | POA: Diagnosis not present

## 2021-01-17 DIAGNOSIS — N1832 Chronic kidney disease, stage 3b: Secondary | ICD-10-CM | POA: Diagnosis not present

## 2021-01-17 DIAGNOSIS — Z5181 Encounter for therapeutic drug level monitoring: Secondary | ICD-10-CM | POA: Diagnosis not present

## 2021-01-17 DIAGNOSIS — E032 Hypothyroidism due to medicaments and other exogenous substances: Secondary | ICD-10-CM | POA: Diagnosis not present

## 2021-01-17 DIAGNOSIS — T451X5A Adverse effect of antineoplastic and immunosuppressive drugs, initial encounter: Secondary | ICD-10-CM | POA: Diagnosis not present

## 2021-01-17 DIAGNOSIS — I10 Essential (primary) hypertension: Secondary | ICD-10-CM | POA: Diagnosis not present

## 2021-01-17 DIAGNOSIS — D696 Thrombocytopenia, unspecified: Secondary | ICD-10-CM | POA: Diagnosis not present

## 2021-01-17 DIAGNOSIS — Z1509 Genetic susceptibility to other malignant neoplasm: Secondary | ICD-10-CM | POA: Diagnosis not present

## 2021-01-29 ENCOUNTER — Ambulatory Visit (INDEPENDENT_AMBULATORY_CARE_PROVIDER_SITE_OTHER): Payer: Medicare Other

## 2021-01-29 ENCOUNTER — Other Ambulatory Visit: Payer: Self-pay | Admitting: Sports Medicine

## 2021-01-29 ENCOUNTER — Other Ambulatory Visit: Payer: Self-pay

## 2021-01-29 ENCOUNTER — Telehealth: Payer: Self-pay

## 2021-01-29 DIAGNOSIS — R9082 White matter disease, unspecified: Secondary | ICD-10-CM | POA: Diagnosis not present

## 2021-01-29 DIAGNOSIS — R112 Nausea with vomiting, unspecified: Secondary | ICD-10-CM | POA: Diagnosis not present

## 2021-01-29 DIAGNOSIS — S0990XA Unspecified injury of head, initial encounter: Secondary | ICD-10-CM

## 2021-01-29 DIAGNOSIS — Z043 Encounter for examination and observation following other accident: Secondary | ICD-10-CM | POA: Diagnosis not present

## 2021-01-29 DIAGNOSIS — M2578 Osteophyte, vertebrae: Secondary | ICD-10-CM | POA: Diagnosis not present

## 2021-01-29 NOTE — Assessment & Plan Note (Signed)
Patient fell, having some vomiting, have not done an exam yet, we need a stat head CT.

## 2021-01-29 NOTE — Telephone Encounter (Signed)
Patient fell and hit head, nausea vomiting, stat head CT.

## 2021-01-29 NOTE — Telephone Encounter (Signed)
Ivin Booty called and stated that patient had a couple falls over the weekend here he hit his head and had vomiting afterward. Patient is refusing to go to UC and you don't have any openings. Please advise.

## 2021-02-07 DIAGNOSIS — Z6837 Body mass index (BMI) 37.0-37.9, adult: Secondary | ICD-10-CM | POA: Diagnosis not present

## 2021-02-07 DIAGNOSIS — Z1379 Encounter for other screening for genetic and chromosomal anomalies: Secondary | ICD-10-CM | POA: Diagnosis not present

## 2021-02-07 DIAGNOSIS — C787 Secondary malignant neoplasm of liver and intrahepatic bile duct: Secondary | ICD-10-CM | POA: Diagnosis not present

## 2021-02-07 DIAGNOSIS — C649 Malignant neoplasm of unspecified kidney, except renal pelvis: Secondary | ICD-10-CM | POA: Diagnosis not present

## 2021-02-07 DIAGNOSIS — N1832 Chronic kidney disease, stage 3b: Secondary | ICD-10-CM | POA: Diagnosis not present

## 2021-02-07 DIAGNOSIS — C642 Malignant neoplasm of left kidney, except renal pelvis: Secondary | ICD-10-CM | POA: Diagnosis not present

## 2021-02-07 DIAGNOSIS — D751 Secondary polycythemia: Secondary | ICD-10-CM | POA: Diagnosis not present

## 2021-02-07 DIAGNOSIS — I25810 Atherosclerosis of coronary artery bypass graft(s) without angina pectoris: Secondary | ICD-10-CM | POA: Diagnosis not present

## 2021-02-07 DIAGNOSIS — C109 Malignant neoplasm of oropharynx, unspecified: Secondary | ICD-10-CM | POA: Diagnosis not present

## 2021-02-07 DIAGNOSIS — D696 Thrombocytopenia, unspecified: Secondary | ICD-10-CM | POA: Diagnosis not present

## 2021-02-07 DIAGNOSIS — E032 Hypothyroidism due to medicaments and other exogenous substances: Secondary | ICD-10-CM | POA: Diagnosis not present

## 2021-02-14 DIAGNOSIS — C642 Malignant neoplasm of left kidney, except renal pelvis: Secondary | ICD-10-CM | POA: Diagnosis not present

## 2021-02-14 DIAGNOSIS — Z5112 Encounter for antineoplastic immunotherapy: Secondary | ICD-10-CM | POA: Diagnosis not present

## 2021-02-14 DIAGNOSIS — C787 Secondary malignant neoplasm of liver and intrahepatic bile duct: Secondary | ICD-10-CM | POA: Diagnosis not present

## 2021-02-14 DIAGNOSIS — C109 Malignant neoplasm of oropharynx, unspecified: Secondary | ICD-10-CM | POA: Diagnosis not present

## 2021-02-15 ENCOUNTER — Other Ambulatory Visit: Payer: Self-pay | Admitting: Sports Medicine

## 2021-02-15 DIAGNOSIS — C649 Malignant neoplasm of unspecified kidney, except renal pelvis: Secondary | ICD-10-CM | POA: Diagnosis not present

## 2021-02-15 DIAGNOSIS — C642 Malignant neoplasm of left kidney, except renal pelvis: Secondary | ICD-10-CM | POA: Diagnosis not present

## 2021-02-15 DIAGNOSIS — C787 Secondary malignant neoplasm of liver and intrahepatic bile duct: Secondary | ICD-10-CM | POA: Diagnosis not present

## 2021-02-18 DIAGNOSIS — C649 Malignant neoplasm of unspecified kidney, except renal pelvis: Secondary | ICD-10-CM | POA: Diagnosis not present

## 2021-02-18 DIAGNOSIS — C109 Malignant neoplasm of oropharynx, unspecified: Secondary | ICD-10-CM | POA: Diagnosis not present

## 2021-02-18 DIAGNOSIS — C642 Malignant neoplasm of left kidney, except renal pelvis: Secondary | ICD-10-CM | POA: Diagnosis not present

## 2021-02-18 DIAGNOSIS — C787 Secondary malignant neoplasm of liver and intrahepatic bile duct: Secondary | ICD-10-CM | POA: Diagnosis not present

## 2021-03-07 DIAGNOSIS — C642 Malignant neoplasm of left kidney, except renal pelvis: Secondary | ICD-10-CM | POA: Diagnosis not present

## 2021-03-07 DIAGNOSIS — I10 Essential (primary) hypertension: Secondary | ICD-10-CM | POA: Diagnosis not present

## 2021-03-07 DIAGNOSIS — C787 Secondary malignant neoplasm of liver and intrahepatic bile duct: Secondary | ICD-10-CM | POA: Diagnosis not present

## 2021-03-07 DIAGNOSIS — E119 Type 2 diabetes mellitus without complications: Secondary | ICD-10-CM | POA: Diagnosis not present

## 2021-03-07 DIAGNOSIS — E032 Hypothyroidism due to medicaments and other exogenous substances: Secondary | ICD-10-CM | POA: Diagnosis not present

## 2021-03-07 DIAGNOSIS — I129 Hypertensive chronic kidney disease with stage 1 through stage 4 chronic kidney disease, or unspecified chronic kidney disease: Secondary | ICD-10-CM | POA: Diagnosis not present

## 2021-03-07 DIAGNOSIS — Z6837 Body mass index (BMI) 37.0-37.9, adult: Secondary | ICD-10-CM | POA: Diagnosis not present

## 2021-03-07 DIAGNOSIS — C109 Malignant neoplasm of oropharynx, unspecified: Secondary | ICD-10-CM | POA: Diagnosis not present

## 2021-03-07 DIAGNOSIS — E1121 Type 2 diabetes mellitus with diabetic nephropathy: Secondary | ICD-10-CM | POA: Diagnosis not present

## 2021-03-07 DIAGNOSIS — Z5112 Encounter for antineoplastic immunotherapy: Secondary | ICD-10-CM | POA: Diagnosis not present

## 2021-03-07 DIAGNOSIS — I25810 Atherosclerosis of coronary artery bypass graft(s) without angina pectoris: Secondary | ICD-10-CM | POA: Diagnosis not present

## 2021-03-07 DIAGNOSIS — N1832 Chronic kidney disease, stage 3b: Secondary | ICD-10-CM | POA: Diagnosis not present

## 2021-03-07 DIAGNOSIS — D696 Thrombocytopenia, unspecified: Secondary | ICD-10-CM | POA: Diagnosis not present

## 2021-03-07 DIAGNOSIS — T451X5A Adverse effect of antineoplastic and immunosuppressive drugs, initial encounter: Secondary | ICD-10-CM | POA: Diagnosis not present

## 2021-03-07 DIAGNOSIS — Z1379 Encounter for other screening for genetic and chromosomal anomalies: Secondary | ICD-10-CM | POA: Diagnosis not present

## 2021-03-12 DIAGNOSIS — K8689 Other specified diseases of pancreas: Secondary | ICD-10-CM | POA: Diagnosis not present

## 2021-03-12 DIAGNOSIS — C787 Secondary malignant neoplasm of liver and intrahepatic bile duct: Secondary | ICD-10-CM | POA: Diagnosis not present

## 2021-03-12 DIAGNOSIS — J9811 Atelectasis: Secondary | ICD-10-CM | POA: Diagnosis not present

## 2021-03-12 DIAGNOSIS — C642 Malignant neoplasm of left kidney, except renal pelvis: Secondary | ICD-10-CM | POA: Diagnosis not present

## 2021-03-12 DIAGNOSIS — C649 Malignant neoplasm of unspecified kidney, except renal pelvis: Secondary | ICD-10-CM | POA: Diagnosis not present

## 2021-03-12 DIAGNOSIS — N200 Calculus of kidney: Secondary | ICD-10-CM | POA: Diagnosis not present

## 2021-03-12 DIAGNOSIS — C109 Malignant neoplasm of oropharynx, unspecified: Secondary | ICD-10-CM | POA: Diagnosis not present

## 2021-03-12 DIAGNOSIS — K8020 Calculus of gallbladder without cholecystitis without obstruction: Secondary | ICD-10-CM | POA: Diagnosis not present

## 2021-03-17 ENCOUNTER — Other Ambulatory Visit: Payer: Self-pay | Admitting: Sports Medicine

## 2021-03-17 DIAGNOSIS — C649 Malignant neoplasm of unspecified kidney, except renal pelvis: Secondary | ICD-10-CM

## 2021-03-25 ENCOUNTER — Other Ambulatory Visit: Payer: Self-pay | Admitting: Sports Medicine

## 2021-03-25 DIAGNOSIS — E1121 Type 2 diabetes mellitus with diabetic nephropathy: Secondary | ICD-10-CM

## 2021-03-28 DIAGNOSIS — I129 Hypertensive chronic kidney disease with stage 1 through stage 4 chronic kidney disease, or unspecified chronic kidney disease: Secondary | ICD-10-CM | POA: Diagnosis not present

## 2021-03-28 DIAGNOSIS — Z5112 Encounter for antineoplastic immunotherapy: Secondary | ICD-10-CM | POA: Diagnosis not present

## 2021-03-28 DIAGNOSIS — C109 Malignant neoplasm of oropharynx, unspecified: Secondary | ICD-10-CM | POA: Diagnosis not present

## 2021-03-28 DIAGNOSIS — I25810 Atherosclerosis of coronary artery bypass graft(s) without angina pectoris: Secondary | ICD-10-CM | POA: Diagnosis not present

## 2021-03-28 DIAGNOSIS — D696 Thrombocytopenia, unspecified: Secondary | ICD-10-CM | POA: Diagnosis not present

## 2021-03-28 DIAGNOSIS — E1122 Type 2 diabetes mellitus with diabetic chronic kidney disease: Secondary | ICD-10-CM | POA: Diagnosis not present

## 2021-03-28 DIAGNOSIS — Z87898 Personal history of other specified conditions: Secondary | ICD-10-CM | POA: Diagnosis not present

## 2021-03-28 DIAGNOSIS — Z85810 Personal history of malignant neoplasm of tongue: Secondary | ICD-10-CM | POA: Diagnosis not present

## 2021-03-28 DIAGNOSIS — C642 Malignant neoplasm of left kidney, except renal pelvis: Secondary | ICD-10-CM | POA: Diagnosis not present

## 2021-03-28 DIAGNOSIS — I10 Essential (primary) hypertension: Secondary | ICD-10-CM | POA: Diagnosis not present

## 2021-03-28 DIAGNOSIS — E032 Hypothyroidism due to medicaments and other exogenous substances: Secondary | ICD-10-CM | POA: Diagnosis not present

## 2021-03-28 DIAGNOSIS — N1832 Chronic kidney disease, stage 3b: Secondary | ICD-10-CM | POA: Diagnosis not present

## 2021-03-28 DIAGNOSIS — Z1379 Encounter for other screening for genetic and chromosomal anomalies: Secondary | ICD-10-CM | POA: Diagnosis not present

## 2021-03-28 DIAGNOSIS — E119 Type 2 diabetes mellitus without complications: Secondary | ICD-10-CM | POA: Diagnosis not present

## 2021-03-28 DIAGNOSIS — T50905A Adverse effect of unspecified drugs, medicaments and biological substances, initial encounter: Secondary | ICD-10-CM | POA: Diagnosis not present

## 2021-03-28 DIAGNOSIS — C787 Secondary malignant neoplasm of liver and intrahepatic bile duct: Secondary | ICD-10-CM | POA: Diagnosis not present

## 2021-04-01 DIAGNOSIS — N184 Chronic kidney disease, stage 4 (severe): Secondary | ICD-10-CM | POA: Diagnosis not present

## 2021-04-17 ENCOUNTER — Other Ambulatory Visit: Payer: Self-pay

## 2021-04-17 ENCOUNTER — Ambulatory Visit (INDEPENDENT_AMBULATORY_CARE_PROVIDER_SITE_OTHER): Payer: Medicare Other | Admitting: Sports Medicine

## 2021-04-17 VITALS — BP 131/81 | HR 62 | Wt 214.1 lb

## 2021-04-17 DIAGNOSIS — I2510 Atherosclerotic heart disease of native coronary artery without angina pectoris: Secondary | ICD-10-CM

## 2021-04-17 DIAGNOSIS — E1121 Type 2 diabetes mellitus with diabetic nephropathy: Secondary | ICD-10-CM

## 2021-04-17 DIAGNOSIS — Z23 Encounter for immunization: Secondary | ICD-10-CM

## 2021-04-17 DIAGNOSIS — Z0000 Encounter for general adult medical examination without abnormal findings: Secondary | ICD-10-CM

## 2021-04-17 LAB — POCT GLYCOSYLATED HEMOGLOBIN (HGB A1C): Hemoglobin A1C: 5.9 % — AB (ref 4.0–5.6)

## 2021-04-17 MED ORDER — SHINGRIX 50 MCG/0.5ML IM SUSR: 0.5000 mL | Freq: Once | INTRAMUSCULAR | 0 refills | Status: AC

## 2021-04-17 NOTE — Assessment & Plan Note (Signed)
Diabetes was under good control with Trulicity 4.5 mg though he was having some low blood sugar warnings on his glucometer, A1c was 6.2% 3 months ago, I decreased Trulicity to 3 mg and O8T has dropped even more to 5.9%. He feels really good. He is happy, return in 6 months.

## 2021-04-17 NOTE — Assessment & Plan Note (Signed)
Clinton Ortiz is doing really well, he is due for Shingrix #2, sending to his pharmacy now.

## 2021-04-17 NOTE — Progress Notes (Signed)
    Procedures performed today:    None.  Independent interpretation of notes and tests performed by another provider:   None.  Brief History, Exam, Impression, and Recommendations:    Controlled type 2 diabetes mellitus with diabetic nephropathy (HCC) Diabetes was under good control with Trulicity 4.5 mg though he was having some low blood sugar warnings on his glucometer, A1c was 6.2% 3 months ago, I decreased Trulicity to 3 mg and T5T has dropped even more to 5.9%. He feels really good. He is happy, return in 6 months.  Annual physical exam Clinton Ortiz is doing really well, he is due for Shingrix #2, sending to his pharmacy now.    ___________________________________________ Gwen Her. Dianah Field, M.D., ABFM., CAQSM. Primary Care and [place] Instructor of [place] of [facility] of Medicine

## 2021-04-18 DIAGNOSIS — C649 Malignant neoplasm of unspecified kidney, except renal pelvis: Secondary | ICD-10-CM | POA: Diagnosis not present

## 2021-04-18 DIAGNOSIS — I10 Essential (primary) hypertension: Secondary | ICD-10-CM | POA: Diagnosis not present

## 2021-04-18 DIAGNOSIS — E1121 Type 2 diabetes mellitus with diabetic nephropathy: Secondary | ICD-10-CM | POA: Diagnosis not present

## 2021-04-18 DIAGNOSIS — N183 Chronic kidney disease, stage 3 unspecified: Secondary | ICD-10-CM | POA: Diagnosis not present

## 2021-04-18 DIAGNOSIS — I480 Paroxysmal atrial fibrillation: Secondary | ICD-10-CM | POA: Diagnosis not present

## 2021-04-18 DIAGNOSIS — C642 Malignant neoplasm of left kidney, except renal pelvis: Secondary | ICD-10-CM | POA: Diagnosis not present

## 2021-04-18 DIAGNOSIS — N1832 Chronic kidney disease, stage 3b: Secondary | ICD-10-CM | POA: Diagnosis not present

## 2021-04-18 DIAGNOSIS — C787 Secondary malignant neoplasm of liver and intrahepatic bile duct: Secondary | ICD-10-CM | POA: Diagnosis not present

## 2021-04-18 DIAGNOSIS — Z5112 Encounter for antineoplastic immunotherapy: Secondary | ICD-10-CM | POA: Diagnosis not present

## 2021-04-18 DIAGNOSIS — I129 Hypertensive chronic kidney disease with stage 1 through stage 4 chronic kidney disease, or unspecified chronic kidney disease: Secondary | ICD-10-CM | POA: Diagnosis not present

## 2021-04-18 DIAGNOSIS — E032 Hypothyroidism due to medicaments and other exogenous substances: Secondary | ICD-10-CM | POA: Diagnosis not present

## 2021-04-18 DIAGNOSIS — C109 Malignant neoplasm of oropharynx, unspecified: Secondary | ICD-10-CM | POA: Diagnosis not present

## 2021-04-18 DIAGNOSIS — D696 Thrombocytopenia, unspecified: Secondary | ICD-10-CM | POA: Diagnosis not present

## 2021-04-20 ENCOUNTER — Other Ambulatory Visit: Payer: Self-pay | Admitting: Sports Medicine

## 2021-04-20 DIAGNOSIS — I10 Essential (primary) hypertension: Secondary | ICD-10-CM

## 2021-04-25 ENCOUNTER — Other Ambulatory Visit: Payer: Self-pay | Admitting: Sports Medicine

## 2021-04-25 DIAGNOSIS — E1121 Type 2 diabetes mellitus with diabetic nephropathy: Secondary | ICD-10-CM

## 2021-04-30 ENCOUNTER — Other Ambulatory Visit: Payer: Self-pay

## 2021-04-30 DIAGNOSIS — M51369 Other intervertebral disc degeneration, lumbar region without mention of lumbar back pain or lower extremity pain: Secondary | ICD-10-CM

## 2021-04-30 DIAGNOSIS — M5136 Other intervertebral disc degeneration, lumbar region: Secondary | ICD-10-CM

## 2021-04-30 MED ORDER — HYDROCODONE-ACETAMINOPHEN 5-325 MG PO TABS: 1.0000 | ORAL_TABLET | Freq: Three times a day (TID) | ORAL | 0 refills | Status: DC | PRN

## 2021-04-30 NOTE — Telephone Encounter (Signed)
Daughter aware prescription sent to pharmacy.

## 2021-04-30 NOTE — Telephone Encounter (Signed)
Last Fill: 11/16/20 #30 Last OV: 04/17/2021

## 2021-05-03 ENCOUNTER — Ambulatory Visit (INDEPENDENT_AMBULATORY_CARE_PROVIDER_SITE_OTHER): Payer: Medicare Other | Admitting: Sports Medicine

## 2021-05-03 ENCOUNTER — Encounter: Payer: Self-pay | Admitting: Sports Medicine

## 2021-05-03 ENCOUNTER — Other Ambulatory Visit: Payer: Self-pay

## 2021-05-03 ENCOUNTER — Ambulatory Visit (INDEPENDENT_AMBULATORY_CARE_PROVIDER_SITE_OTHER): Payer: Medicare Other

## 2021-05-03 DIAGNOSIS — J440 Chronic obstructive pulmonary disease with acute lower respiratory infection: Secondary | ICD-10-CM

## 2021-05-03 DIAGNOSIS — M47816 Spondylosis without myelopathy or radiculopathy, lumbar region: Secondary | ICD-10-CM

## 2021-05-03 DIAGNOSIS — I2510 Atherosclerotic heart disease of native coronary artery without angina pectoris: Secondary | ICD-10-CM

## 2021-05-03 DIAGNOSIS — R059 Cough, unspecified: Secondary | ICD-10-CM | POA: Diagnosis not present

## 2021-05-03 MED ORDER — DOXYCYCLINE HYCLATE 100 MG PO TABS: 100.0000 mg | ORAL_TABLET | Freq: Two times a day (BID) | ORAL | 0 refills | Status: AC

## 2021-05-03 MED ORDER — HYDROCOD POLST-CPM POLST ER 10-8 MG/5ML PO SUER: 5.0000 mL | Freq: Two times a day (BID) | ORAL | 0 refills | Status: DC | PRN

## 2021-05-03 MED ORDER — PREDNISONE 50 MG PO TABS: ORAL_TABLET | ORAL | 0 refills | Status: DC

## 2021-05-03 NOTE — Progress Notes (Signed)
° ° °  Procedures performed today:    None.  Independent interpretation of notes and tests performed by another provider:   None.  Brief History, Exam, Impression, and Recommendations:    COPD (chronic obstructive pulmonary disease) ([place]) Clinton Ortiz returns, for 2 weeks has had increasing cough, historically he has done well on Trelegy with his COPD. Adding 5 days of prednisone, doxycycline, he can use his albuterol as needed. Adding Tussionex, chest x-ray. Return to see me if not better in a couple of weeks.  Lumbar spondylosis Clinton Ortiz also has some chronic back pain, he did not respond initially to lumbar epidurals but has not done his L3-S1 facet joint injections, we deferred them last year, when he is ready he can let me know and I am happy to order the facet joint injections.    ___________________________________________ Gwen Her. Dianah Field, M.D., ABFM., CAQSM. Primary Care and [place] Instructor of [place] of [facility] of Medicine

## 2021-05-03 NOTE — Assessment & Plan Note (Signed)
Clinton Ortiz returns, for 2 weeks has had increasing cough, historically he has done well on Trelegy with his COPD. Adding 5 days of prednisone, doxycycline, he can use his albuterol as needed. Adding Tussionex, chest x-ray. Return to see me if not better in a couple of weeks.

## 2021-05-03 NOTE — Assessment & Plan Note (Signed)
Clinton Ortiz also has some chronic back pain, he did not respond initially to lumbar epidurals but has not done his L3-S1 facet joint injections, we deferred them last year, when he is ready he can let me know and I am happy to order the facet joint injections.

## 2021-05-09 DIAGNOSIS — Z5112 Encounter for antineoplastic immunotherapy: Secondary | ICD-10-CM | POA: Diagnosis not present

## 2021-05-09 DIAGNOSIS — C787 Secondary malignant neoplasm of liver and intrahepatic bile duct: Secondary | ICD-10-CM | POA: Diagnosis not present

## 2021-05-09 DIAGNOSIS — C642 Malignant neoplasm of left kidney, except renal pelvis: Secondary | ICD-10-CM | POA: Diagnosis not present

## 2021-05-15 ENCOUNTER — Telehealth: Payer: Self-pay

## 2021-05-15 MED ORDER — TRULICITY 1.5 MG/0.5ML ~~LOC~~ SOAJ
3.0000 mg | SUBCUTANEOUS | 0 refills | Status: DC
Start: 2021-05-15 — End: 2021-06-10

## 2021-05-15 NOTE — Telephone Encounter (Signed)
I can send in a short supply of the 1.5 mg and he can just do 2 shots.

## 2021-05-15 NOTE — Telephone Encounter (Signed)
Daughter, Ivin Booty, aware of the plan and is agreeable.

## 2021-05-15 NOTE — Telephone Encounter (Signed)
Clinton Ortiz called stating that the pharmacy told her the Trulicity 3.0 is on backorder and "no one has it". She doesn't know what to do. [name] accidentally took the higher dose a couple days and had low blood sugars. Please advise.

## 2021-05-17 ENCOUNTER — Ambulatory Visit (INDEPENDENT_AMBULATORY_CARE_PROVIDER_SITE_OTHER): Payer: Medicare Other | Admitting: Sports Medicine

## 2021-05-17 ENCOUNTER — Other Ambulatory Visit: Payer: Self-pay

## 2021-05-17 ENCOUNTER — Encounter: Payer: Self-pay | Admitting: Sports Medicine

## 2021-05-17 DIAGNOSIS — J449 Chronic obstructive pulmonary disease, unspecified: Secondary | ICD-10-CM

## 2021-05-17 DIAGNOSIS — E1121 Type 2 diabetes mellitus with diabetic nephropathy: Secondary | ICD-10-CM

## 2021-05-17 NOTE — Progress Notes (Addendum)
° ° °  Procedures performed today:    None.  Independent interpretation of notes and tests performed by another provider:   None.  Brief History, Exam, Impression, and Recommendations:    COPD (chronic obstructive pulmonary disease) ([place]) I saw Laquinton 2 weeks ago with a COPD exacerbation, we treated him as is typical with doxycycline, steroids, chest x-ray was clear. Is doing really well today lungs are clear, return as needed, continue Trelegy.  Controlled type 2 diabetes mellitus with diabetic nephropathy ([place]) Ison does have diabetes mellitus type 2, we decreased his Trulicity to 3 mg from 4.5 mg, he was having some low blood sugar warnings on his glucometer, A1c was 5.9% back in December 2022. Due to frequent low blood sugar warnings he does need to check his blood sugars continuously, at least 3-4 times per day, and thus he is a good candidate for a continuous blood sugar monitor such as the freestyle libre or Dexcom.    ___________________________________________ Gwen Her. Dianah Field, M.D., ABFM., CAQSM. Primary Care and [place] Instructor of [place] of [facility] of Medicine

## 2021-05-17 NOTE — Assessment & Plan Note (Signed)
I saw Clinton Ortiz 2 weeks ago with a COPD exacerbation, we treated him as is typical with doxycycline, steroids, chest x-ray was clear. Is doing really well today lungs are clear, return as needed, continue Trelegy.

## 2021-05-21 ENCOUNTER — Other Ambulatory Visit: Payer: Self-pay | Admitting: Sports Medicine

## 2021-06-05 ENCOUNTER — Other Ambulatory Visit: Payer: Self-pay | Admitting: Sports Medicine

## 2021-06-05 DIAGNOSIS — K219 Gastro-esophageal reflux disease without esophagitis: Secondary | ICD-10-CM

## 2021-06-05 NOTE — Assessment & Plan Note (Signed)
Clinton Ortiz does have diabetes mellitus type 2, we decreased his Trulicity to 3 mg from 4.5 mg, he was having some low blood sugar warnings on his glucometer, A1c was 5.9% back in December 2022. Due to frequent low blood sugar warnings he does need to check his blood sugars continuously, at least 3-4 times per day, and thus he is a good candidate for a continuous blood sugar monitor such as the freestyle libre or Dexcom.

## 2021-06-09 ENCOUNTER — Other Ambulatory Visit: Payer: Self-pay | Admitting: Sports Medicine

## 2021-06-18 ENCOUNTER — Ambulatory Visit (INDEPENDENT_AMBULATORY_CARE_PROVIDER_SITE_OTHER): Payer: Medicare Other

## 2021-06-18 ENCOUNTER — Ambulatory Visit (INDEPENDENT_AMBULATORY_CARE_PROVIDER_SITE_OTHER): Payer: Medicare Other | Admitting: Sports Medicine

## 2021-06-18 ENCOUNTER — Other Ambulatory Visit: Payer: Self-pay

## 2021-06-18 DIAGNOSIS — J449 Chronic obstructive pulmonary disease, unspecified: Secondary | ICD-10-CM | POA: Diagnosis not present

## 2021-06-18 DIAGNOSIS — R0602 Shortness of breath: Secondary | ICD-10-CM | POA: Diagnosis not present

## 2021-06-18 MED ORDER — PREDNISONE 50 MG PO TABS: 50.0000 mg | ORAL_TABLET | Freq: Every day | ORAL | 0 refills | Status: DC

## 2021-06-18 MED ORDER — AZITHROMYCIN 250 MG PO TABS: ORAL_TABLET | ORAL | 0 refills | Status: DC

## 2021-06-18 NOTE — Assessment & Plan Note (Addendum)
Increasing shortness of breath, headache, I suspect he is having a viral process. He is also gained about 20 pounds, has been out of his 3 mg Trulicity.  We gave him some samples of the 1.5 mg Trulicity today. Adding chest x-ray, prednisone, azithromycin. He will do a home COVID test. Return in 2 weeks.

## 2021-06-18 NOTE — Progress Notes (Signed)
° ° °  Procedures performed today:    None.  Independent interpretation of notes and tests performed by another provider:   None.  Brief History, Exam, Impression, and Recommendations:    COPD (chronic obstructive pulmonary disease) (HCC) Increasing shortness of breath, headache, I suspect he is having a viral process. He is also gained about 20 pounds, has been out of his 3 mg Trulicity.  We gave him some samples of the 1.5 mg Trulicity today. Adding chest x-ray, prednisone, azithromycin. He will do a home COVID test. Return in 2 weeks.  Chronic process with exacerbation and pharmacologic intervention  ___________________________________________ Gwen Her. Dianah Field, M.D., ABFM., CAQSM. Primary Care and [place] Instructor of [place] of [facility] of Medicine

## 2021-06-21 ENCOUNTER — Telehealth: Payer: Self-pay

## 2021-06-21 NOTE — Telephone Encounter (Signed)
Spoke with daughter, Ivin Booty, regarding some results. She stated that patient would need a PA on his Freestyle sensors and Eliquis.

## 2021-06-23 ENCOUNTER — Other Ambulatory Visit: Payer: Self-pay | Admitting: Sports Medicine

## 2021-06-23 DIAGNOSIS — I10 Essential (primary) hypertension: Secondary | ICD-10-CM

## 2021-06-25 ENCOUNTER — Telehealth: Payer: Self-pay

## 2021-06-25 NOTE — Telephone Encounter (Addendum)
Initiated Prior authorization ADL:[id number] Blood Gluc Sensor (FREESTYLE LIBRE 2 Via: Covermymeds Case/Key: [id number] Status: denied as of 06/25/21 Reason:plan exclusion Notified Pt via: Called daughter left vm.   Initiated Prior authorization SVE:XOGACGB 5 MG TABS tablet  Via: Covermymeds Case/Key:[id number] - PA Case ID: [id number] Status: approved as of 06/25/21 $47 copay [facility] Reason:Additional Information Required This medication or product is on your plan's list of covered drugs. Prior authorization is not required at this time. If your pharmacy has questions regarding the processing of your prescription, please have them call the [facility] pharmacy help desk at ([phone]. Notified Pt via: called daughter left vm

## 2021-07-02 ENCOUNTER — Ambulatory Visit (INDEPENDENT_AMBULATORY_CARE_PROVIDER_SITE_OTHER): Payer: Medicare Other | Admitting: Sports Medicine

## 2021-07-02 ENCOUNTER — Other Ambulatory Visit: Payer: Self-pay

## 2021-07-02 ENCOUNTER — Encounter: Payer: Self-pay | Admitting: Sports Medicine

## 2021-07-02 DIAGNOSIS — M5136 Other intervertebral disc degeneration, lumbar region: Secondary | ICD-10-CM

## 2021-07-02 DIAGNOSIS — J449 Chronic obstructive pulmonary disease, unspecified: Secondary | ICD-10-CM | POA: Diagnosis not present

## 2021-07-02 DIAGNOSIS — M51369 Other intervertebral disc degeneration, lumbar region without mention of lumbar back pain or lower extremity pain: Secondary | ICD-10-CM

## 2021-07-02 DIAGNOSIS — M1612 Unilateral primary osteoarthritis, left hip: Secondary | ICD-10-CM | POA: Diagnosis not present

## 2021-07-02 MED ORDER — HYDROCODONE-ACETAMINOPHEN 5-325 MG PO TABS: 1.0000 | ORAL_TABLET | Freq: Two times a day (BID) | ORAL | 0 refills | Status: DC | PRN

## 2021-07-02 NOTE — Assessment & Plan Note (Signed)
Clinton Ortiz also has left hip and groin pain, this occurred after trying to pull/lift his debilitated wife. We will give a couple weeks of pain medicine before considering a hip joint injection.

## 2021-07-02 NOTE — Assessment & Plan Note (Signed)
Vinny returns, he is a pleasant 81 year old male, we treated him for a COPD exacerbation at the last visit with prednisone, azithromycin, he has done well, return as needed for this.

## 2021-07-02 NOTE — Progress Notes (Signed)
° ° °  Procedures performed today:    None.  Independent interpretation of notes and tests performed by another provider:   None.  Brief History, Exam, Impression, and Recommendations:    COPD (chronic obstructive pulmonary disease) ([place]) Clinton Ortiz returns, he is a pleasant 81 year old male, we treated him for a COPD exacerbation at the last visit with prednisone, azithromycin, he has done well, return as needed for this.  Primary osteoarthritis of left hip Clinton Ortiz also has left hip and groin pain, this occurred after trying to pull/lift his debilitated wife. We will give a couple weeks of pain medicine before considering a hip joint injection.  Chronic process with exacerbation and pharmacologic intervention  ___________________________________________ Gwen Her. Dianah Field, M.D., ABFM., CAQSM. Primary Care and [place] Instructor of [place] of [facility] of Medicine

## 2021-07-16 ENCOUNTER — Ambulatory Visit: Payer: Medicare Other | Admitting: Sports Medicine

## 2021-07-20 ENCOUNTER — Other Ambulatory Visit: Payer: Self-pay | Admitting: Sports Medicine

## 2021-07-20 DIAGNOSIS — C649 Malignant neoplasm of unspecified kidney, except renal pelvis: Secondary | ICD-10-CM

## 2021-07-20 DIAGNOSIS — E7800 Pure hypercholesterolemia, unspecified: Secondary | ICD-10-CM

## 2021-08-19 ENCOUNTER — Other Ambulatory Visit: Payer: Self-pay | Admitting: Sports Medicine

## 2021-08-19 DIAGNOSIS — I10 Essential (primary) hypertension: Secondary | ICD-10-CM

## 2021-08-22 ENCOUNTER — Telehealth: Payer: Self-pay

## 2021-08-22 DIAGNOSIS — M47816 Spondylosis without myelopathy or radiculopathy, lumbar region: Secondary | ICD-10-CM

## 2021-08-22 NOTE — Telephone Encounter (Signed)
Patient's hydrocodone 5/325 is on backorder at [facility] and they don't know when they will get anymore in. The pharmacy told Clinton Ortiz to call us to see about calling in a different strength to accommodate patient's needs.  ?

## 2021-08-23 MED ORDER — HYDROCODONE-ACETAMINOPHEN 10-325 MG PO TABS: 0.5000 | ORAL_TABLET | Freq: Two times a day (BID) | ORAL | 0 refills | Status: DC | PRN

## 2021-08-23 NOTE — Telephone Encounter (Signed)
Left msg for sharon with new prescription and directions. Question or concerns call office at [phone] ?

## 2021-08-23 NOTE — Telephone Encounter (Signed)
We can try a half tab of 10/325. ?

## 2021-08-26 ENCOUNTER — Other Ambulatory Visit: Payer: Self-pay | Admitting: Sports Medicine

## 2021-10-05 IMAGING — DX DG CERVICAL SPINE COMPLETE 4+V
6 series · 6 of 6 positions shown · non-contrast
Comparison: Cervical spine x-ray 11/05/2017.

CLINICAL DATA: Fall.

EXAM:
CERVICAL SPINE - COMPLETE 4+ VIEW

[c-spine lat]
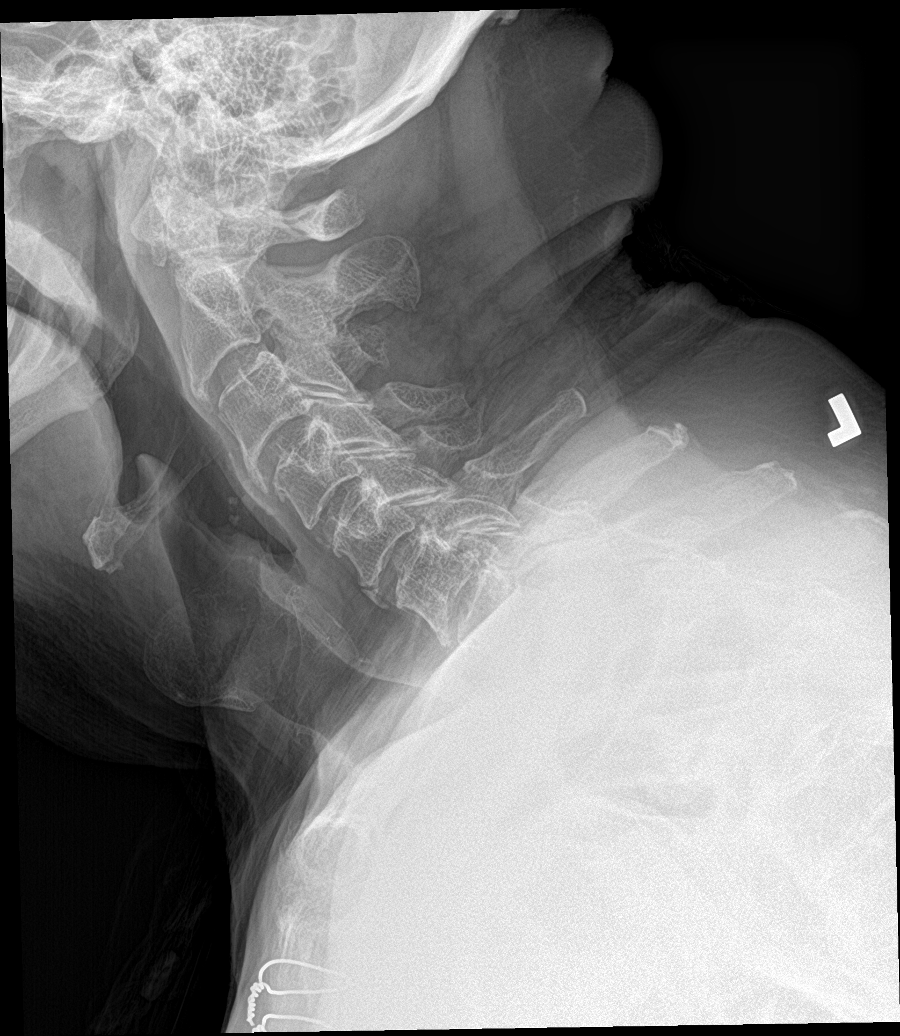

[c-spine obl (1 of 2)]
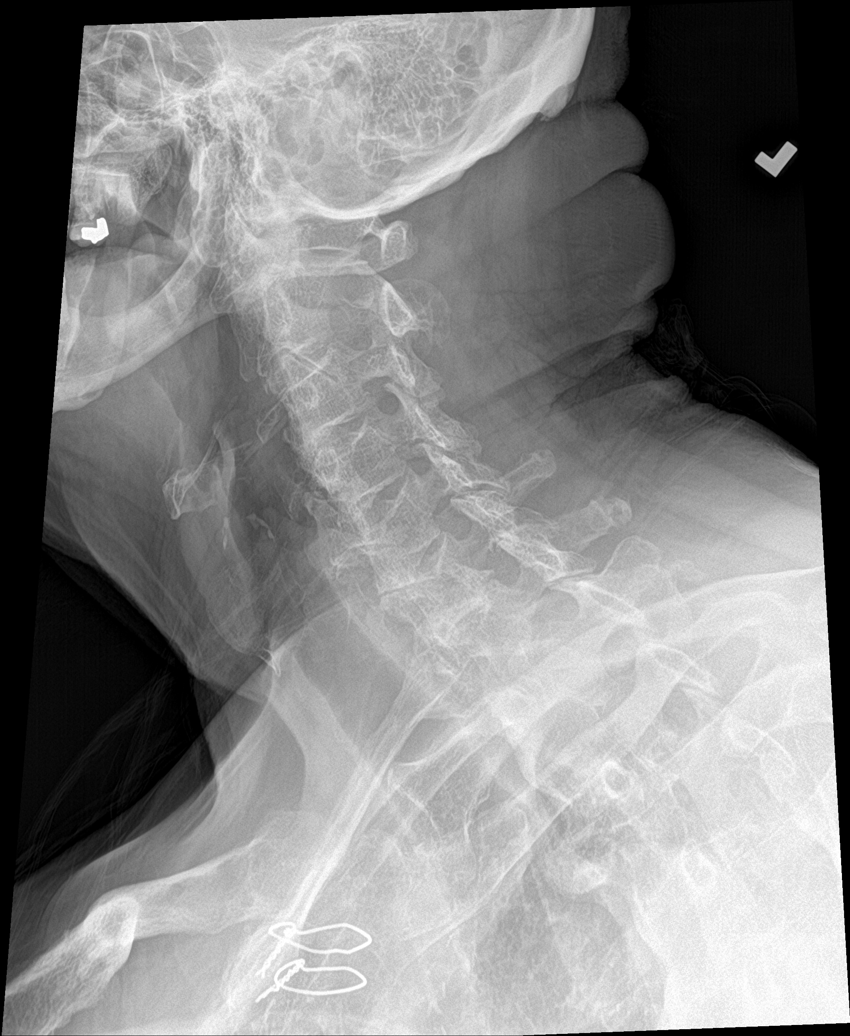

[c-spine obl (2 of 2)]
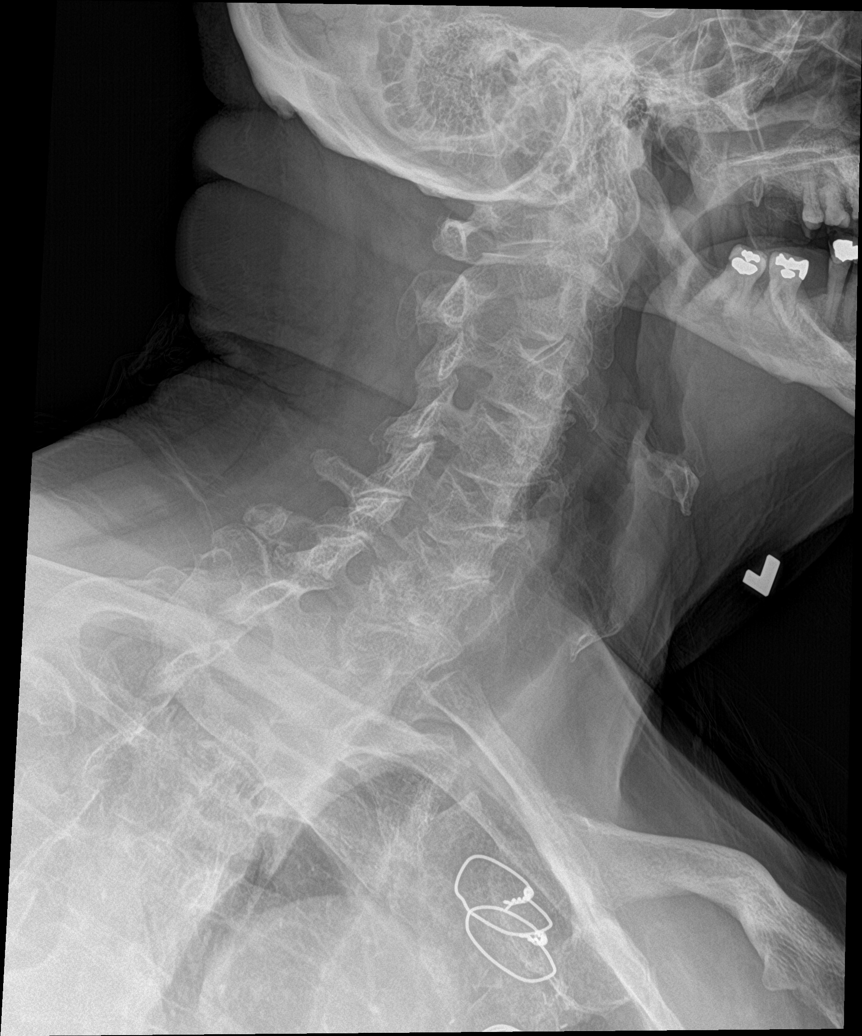

[c-spine ap]
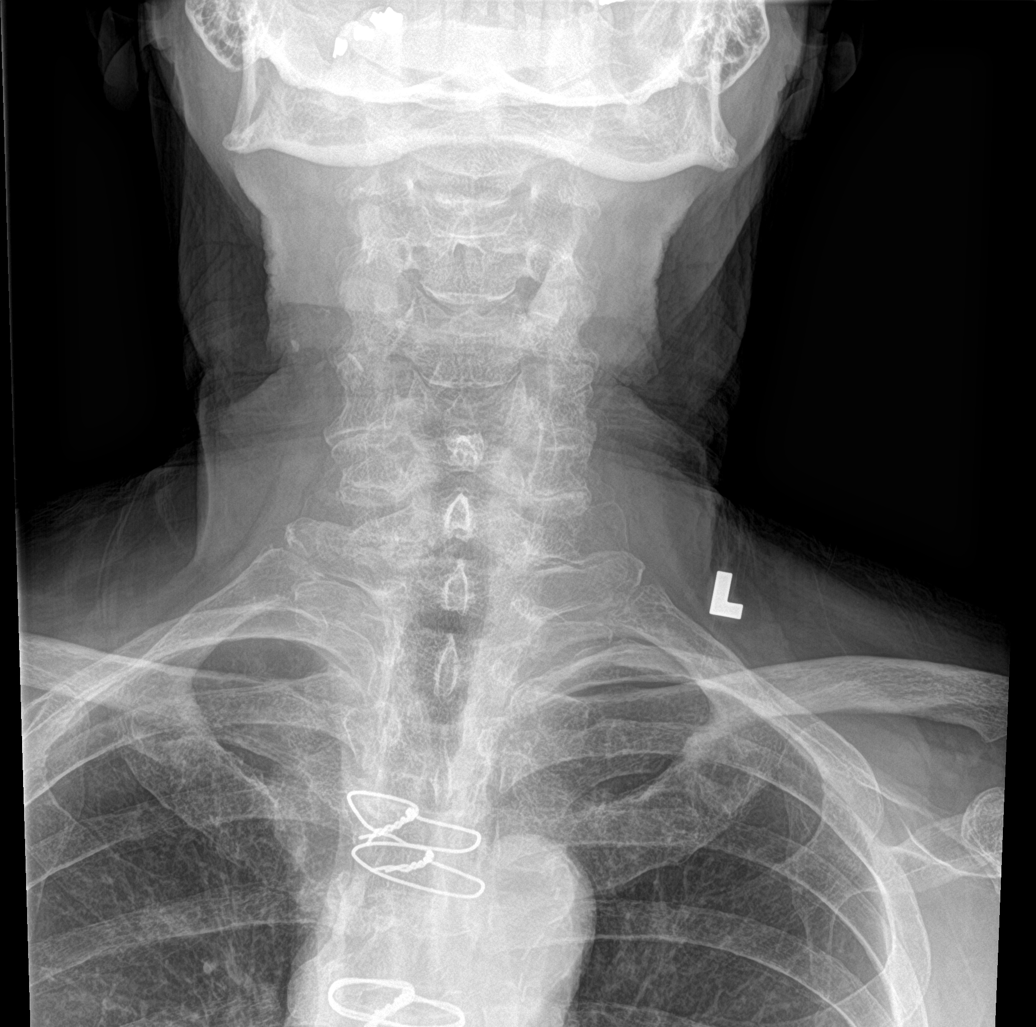

[c-spine open mouth]
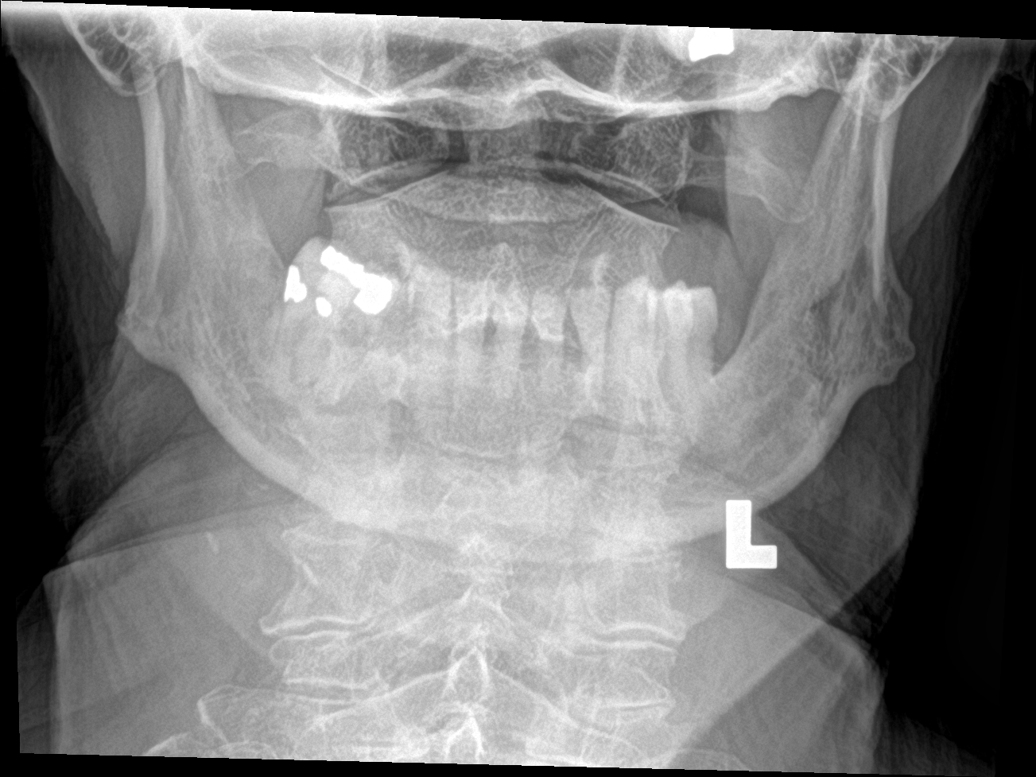

[c-spine swimmers]
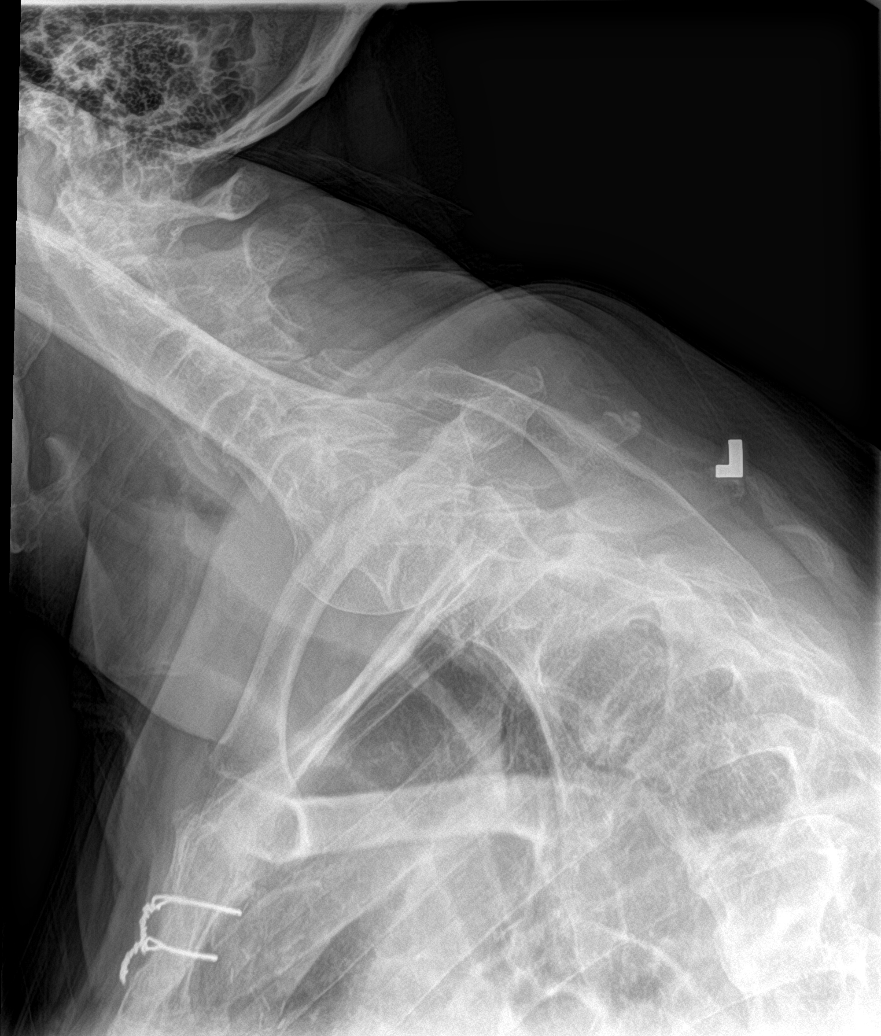

[6 of 6 positions shown; findings below may reference images not displayed]

FINDINGS: The bones are osteopenic. Alignment is anatomic. There is no
evidence for acute fracture. Neural foramina appear patent
bilaterally. C1-C2 interval is within normal limits on the
open-mouth view. There is no prevertebral soft tissue swelling.
There is stable mild disc space narrowing and osteophyte formation
throughout the cervical spine most significant at C5-C6 and C6-C7
compatible with degenerative change. Sternotomy wires are noted.
IMPRESSION: 1. No acute fracture or malalignment of the cervical spine.
2. Stable degenerative changes.

## 2021-10-13 ENCOUNTER — Other Ambulatory Visit: Payer: Self-pay | Admitting: Sports Medicine

## 2021-10-13 DIAGNOSIS — E1121 Type 2 diabetes mellitus with diabetic nephropathy: Secondary | ICD-10-CM

## 2021-10-16 ENCOUNTER — Ambulatory Visit (INDEPENDENT_AMBULATORY_CARE_PROVIDER_SITE_OTHER): Payer: Medicare Other | Admitting: Sports Medicine

## 2021-10-16 VITALS — BP 134/78 | HR 69 | Ht 67.0 in | Wt 241.0 lb

## 2021-10-16 DIAGNOSIS — J449 Chronic obstructive pulmonary disease, unspecified: Secondary | ICD-10-CM | POA: Diagnosis not present

## 2021-10-16 DIAGNOSIS — E1121 Type 2 diabetes mellitus with diabetic nephropathy: Secondary | ICD-10-CM

## 2021-10-16 LAB — POCT GLYCOSYLATED HEMOGLOBIN (HGB A1C): HbA1c, POC (controlled diabetic range): 6.4 % (ref 0.0–7.0)

## 2021-10-16 MED ORDER — HYDROCODONE-ACETAMINOPHEN 5-325 MG PO TABS: 1.0000 | ORAL_TABLET | Freq: Three times a day (TID) | ORAL | 0 refills | Status: DC | PRN

## 2021-10-16 MED ORDER — BENZONATATE 200 MG PO CAPS: 200.0000 mg | ORAL_CAPSULE | Freq: Three times a day (TID) | ORAL | 11 refills | Status: DC | PRN

## 2021-10-16 NOTE — Assessment & Plan Note (Signed)
Pleasant 81 year old male returns, historically well controlled COPD with occasional albuterol and Trelegy daily. Has been having worsening cough over the past several months. Typically controlled with dextromethorphan. Adding [employer], he does get a CT chest abdomen pelvis for follow-up of his renal cell carcinoma, this will help ensure that we are not dealing with a metastatic process.

## 2021-10-16 NOTE — Assessment & Plan Note (Signed)
Clinton Ortiz is also having some low blood sugar readings, his A1c at the last visit in December was 5.9%. We decreased his Trulicity from 4.5 to 3 mg, he returns today with an A1c of 6.4%, no longer getting the low readings. Sometimes he drops into the 60s at night, he can have a small snack such as a graham cracker before bedtime to mitigate this.

## 2021-10-16 NOTE — Progress Notes (Signed)
    Procedures performed today:    None.  Independent interpretation of notes and tests performed by another provider:   None.  Brief History, Exam, Impression, and Recommendations:    COPD (chronic obstructive pulmonary disease) ([place]) Pleasant 81 year old male returns, historically well controlled COPD with occasional albuterol and Trelegy daily. Has been having worsening cough over the past several months. Typically controlled with dextromethorphan. Adding [employer], he does get a CT chest abdomen pelvis for follow-up of his renal cell carcinoma, this will help ensure that we are not dealing with a metastatic process.  Controlled type 2 diabetes mellitus with diabetic nephropathy [facility]) Maury is also having some low blood sugar readings, his A1c at the last visit in December was 5.9%. We decreased his Trulicity from 4.5 to 3 mg, he returns today with an A1c of 6.4%, no longer getting the low readings. Sometimes he drops into the 60s at night, he can have a small snack such as a graham cracker before bedtime to mitigate this.  Chronic process with exacerbation and pharmacologic intervention  ___________________________________________ Gwen Her. Dianah Field, M.D., ABFM., CAQSM. Primary Care and [place] Instructor of [place] of [facility] of Medicine

## 2021-10-20 ENCOUNTER — Other Ambulatory Visit: Payer: Self-pay | Admitting: Sports Medicine

## 2021-10-20 DIAGNOSIS — F4321 Adjustment disorder with depressed mood: Secondary | ICD-10-CM

## 2021-10-20 DIAGNOSIS — I10 Essential (primary) hypertension: Secondary | ICD-10-CM

## 2021-11-24 ENCOUNTER — Other Ambulatory Visit: Payer: Self-pay | Admitting: Sports Medicine

## 2021-11-25 NOTE — Progress Notes (Signed)
HPI: Follow-up coronary artery disease.  Cardiac cath  February 2021 showed 60-70% left main, 50-60% LAD, 60-70% circumflex.  Patient underwent coronary artery bypass and graft March 2021 with LIMA to the LAD, saphenous vein graft to the diagonal, saphenous vein graft to the ramus intermediate and saphenous vein graft to the right coronary artery.  Patient did develop postoperative atrial fibrillation treated with amiodarone.  Had syncopal episode and seen at [facility] March 2021.  Echocardiogram at that time showed normal LV function, mild left ventricular hypertrophy and small pericardial effusion.  Developed recurrent atrial fibrillation and had cardioversion April 2021.  Also with metastatic renal cell carcinoma.  Since last seen the patient has dyspnea with more extreme activities but not with routine activities. It is relieved with rest. It is not associated with chest pain. There is no orthopnea, PND or pedal edema. There is no syncope or palpitations. There is no exertional chest pain.   Current Outpatient Medications  Medication Sig Dispense Refill   Albuterol Sulfate (PROAIR RESPICLICK) 675 (90 Base) MCG/ACT AEPB Inhale 2 puffs into the lungs every 4 (four) hours as needed (shortness of breath). 1 each 11   AMBULATORY NON FORMULARY MEDICATION Humidifier to be used with O2.  Fax to [facility] f: [phone] 1 each 0   AMBULATORY NON FORMULARY MEDICATION Medication Name: Wound care supplies as needed. 1 each 11   azelastine (ASTELIN) 0.1 % nasal spray SPRAY 2 SPRAYS INTO EACH NOSTRIL TWICE A DAY AS DIRECTED (Patient taking differently: Place 2 sprays into both nostrils daily as needed for rhinitis or allergies.) 30 mL 1   B Complex-C (VITAMIN B + C COMPLEX PO) Take by mouth.     benzonatate (TESSALON) 200 MG capsule Take 1 capsule (200 mg total) by mouth 3 (three) times daily as needed for cough. 45 capsule 11   Continuous Blood Gluc Receiver (FREESTYLE LIBRE 2 READER) DEVI 1 each by  Does not apply route in the morning, at noon, in the evening, and at bedtime. 1 each 11   Continuous Blood Gluc Sensor (FREESTYLE LIBRE 2 SENSOR) MISC 1 APPLICATION BY DOES NOT APPLY ROUTE EVERY 14 (FOURTEEN) DAYS. APPLY UPPER DELTOID EVERY 14 DAYS, USE READER TO DETERMINE BLOOD SUGARS 3 each 11   ELIQUIS 5 MG TABS tablet TAKE 1 TABLET BY MOUTH TWICE A DAY 60 tablet 2   Fluticasone-Umeclidin-Vilant (TRELEGY ELLIPTA) 200-62.5-25 MCG/INH AEPB 1 puff inhaled daily 28 each 11   gabapentin (NEURONTIN) 300 MG capsule TAKE 1 CAPSULE BY MOUTH TWICE A DAY 90 capsule 3   HYDROcodone-acetaminophen (NORCO/VICODIN) 5-325 MG tablet Take 1 tablet by mouth every 8 (eight) hours as needed for moderate pain. 60 tablet 0   levothyroxine (SYNTHROID) 50 MCG tablet Take 50 mcg by mouth every morning.     metoCLOPramide (REGLAN) 10 MG tablet 1 tab PO TID prn Nausea 30 tablet 3   metoprolol tartrate (LOPRESSOR) 25 MG tablet TAKE 1 TABLET BY MOUTH TWICE A DAY 60 tablet 1   nitroGLYCERIN (NITROSTAT) 0.4 MG SL tablet PLACE 1 TABLET UNDER THE TONGUE EVERY 5 MINUTES AS NEEDED FOR CHEST PAIN. 5 tablet 11   ondansetron (ZOFRAN-ODT) 8 MG disintegrating tablet TAKE 1 TABLET BY MOUTH EVERY 8 HOURS AS NEEDED FOR NAUSEA 20 tablet 3   pantoprazole (PROTONIX) 40 MG tablet TAKE 1 TABLET BY MOUTH EVERY DAY 90 tablet 3   polyethylene glycol powder (GLYCOLAX/MIRALAX) 17 GM/SCOOP powder Take 0.5 Containers by mouth daily as needed for mild constipation  or moderate constipation.      pravastatin (PRAVACHOL) 80 MG tablet TAKE 1 TABLET BY MOUTH EVERY DAY 90 tablet 3   sertraline (ZOLOFT) 50 MG tablet TAKE 1 TABLET BY MOUTH EVERY DAY 90 tablet 3   tamsulosin (FLOMAX) 0.4 MG CAPS capsule TAKE 2 CAPSULES BY MOUTH EVERY DAY 180 capsule 1   Thiamine HCl (VITAMIN B-1 PO) Take by mouth.     triamcinolone (NASACORT) 55 MCG/ACT AERO nasal inhaler PLACE 2 SPRAYS INTO THE NOSE DAILY. (Patient taking differently: Place 2 sprays into the nose daily as  needed (congestion).) 1 Inhaler 11   TRULICITY 1.5 BM/8.4XL SOPN INJECT 3 MG INTO THE SKIN ONCE A WEEK FOR 14 DAYS. 3 mL 2   TRULICITY 3 KG/4.0NU SOPN 1 INJECTION SUBCUTANEOUS ON MONDAYS 2 mL 11   UNABLE TO FIND Med Name: Super beets chews     UNABLE TO FIND Med Name: Digestive Advantage probiotic chews     VITAMIN D PO Take by mouth. Vitamin D3     No current facility-administered medications for this visit.     Past Medical History:  Diagnosis Date   Arthritis    CAD (coronary artery disease)    07/01/2019: LHC with LM disease. Needs CABG.   Cancer [facility]) 2013   left kidney   Complication of anesthesia    slow to wake up after kidney stone surgery   Constipation, chronic    COPD (chronic obstructive pulmonary disease) ([place])    Diabetes mellitus ([place])    Dyspnea    with exertion   Family history of adverse reaction to anesthesia    daughter couldn't talk for 3-4 days after anesthesia    Frequent urination    GERD (gastroesophageal reflux disease)    Headache    Hernia    History of kidney stones    Hyperlipidemia    Hypertension    Peripheral vascular disease ([place])    right leg blood clot 20 plus years ago   Pneumonia    Renal insufficiency    stage 2 (only 1 kidney) had Kidney cancer   Spondylolysis    Tongue cancer ([place]) 2011    Past Surgical History:  Procedure Laterality Date   CARDIOVERSION N/A 09/02/2019   Procedure: CARDIOVERSION;  Surgeon: Buford Dresser, MD;  Location: [place];  Service: Cardiovascular;  Laterality: N/A;   CENTRAL VENOUS CATHETER INSERTION Right 07/12/2019   Procedure: Insertion Central Line Adult;  Surgeon: Ivin Poot, MD;  Location: [place];  Service: Open Heart Surgery;  Laterality: Right;  Right subclavian    COLONOSCOPY     CORONARY ARTERY BYPASS GRAFT N/A 07/12/2019   Procedure: CORONARY ARTERY BYPASS GRAFTING (CABG), ON PUMP, TIMES FOUR , USING LEFT INTERNAL MAMMARY ARTERY AND ENDOSCOPICALLY HARVESTED BILATERAL GREATER  SAPHENOUS VEINS;  Surgeon: Ivin Poot, MD;  Location: [place];  Service: Open Heart Surgery;  Laterality: N/A;   HERNIA REPAIR     abdominal   KIDNEY STONE SURGERY     LEFT HEART CATH AND CORONARY ANGIOGRAPHY N/A 07/01/2019   Procedure: LEFT HEART CATH AND CORONARY ANGIOGRAPHY;  Surgeon: Belva Crome, MD;  Location: [place] CV LAB;  Service: Cardiovascular;  Laterality: N/A;   NEPHRECTOMY Left 10/08/2011   s/p robotic assisted lap left radical nephrectomy 10/08/11   PARTIAL GLOSSECTOMY  12/07/2009   s/p partial glossectomy for SCC right tongue 12/07/09   TEE WITHOUT CARDIOVERSION N/A 07/12/2019   Procedure: TRANSESOPHAGEAL ECHOCARDIOGRAM (TEE);  Surgeon: Prescott Gum, Collier Salina, MD;  Location: [facility];  Service: Open Heart Surgery;  Laterality: N/A;    Social History   Socioeconomic History   Marital status: Married    Spouse name: Not on file   Number of children: 2   Years of education: Not on file   Highest education level: Not on file  Occupational History   Not on file  Tobacco Use   Smoking status: Former    Packs/day: 1.00    Years: 2.00    Total pack years: 2.00    Types: Cigarettes   Smokeless tobacco: Never   Tobacco comments:    smoked 3 years as a teenager  Substance and Sexual Activity   Alcohol use: No   Drug use: No   Sexual activity: Never  Other Topics Concern   Not on file  Social History Narrative   Not on file   Social Determinants of Health   Financial Resource Strain: Not on file  Food Insecurity: Not on file  Transportation Needs: Not on file  Physical Activity: Not on file  Stress: Not on file  Social Connections: Not on file  Intimate Partner Violence: Not on file    Family History  Problem Relation Age of Onset   Cancer Mother        bladder   Heart attack Father     ROS: no fevers or chills, productive cough, hemoptysis, dysphasia, odynophagia, melena, hematochezia, dysuria, hematuria, rash, seizure activity, orthopnea, PND, pedal  edema, claudication. Remaining systems are negative.  Physical Exam: Well-developed well-nourished in no acute distress.  Skin is warm and dry.  HEENT is normal.  Neck is supple.  Chest is clear to auscultation with normal expansion.  Cardiovascular exam is regular rate and rhythm.  Abdominal exam nontender or distended. No masses palpated. Extremities show no edema. neuro grossly intact  ECG-sinus rhythm at a rate of 100, no ST changes.  First-degree AV block. 2 Personally reviewed  A/P  1 CAD s/p CABG-continue statin; no ASA given need for apixaban.   2 H/O atrial fibrillation-this occurred postoperatively.  Patient remains in sinus rhythm.  Amiodarone discontinued previously.  Most recent episode of atrial fibrillation occurred 1 month following coronary artery bypass graft and we will therefore continue anticoagulation long-term.  3 Hypertension-blood pressure controlled.  Continue present medical regimen.  4 hyperlipidemia-continue statin.  5 chronic stage III kidney disease-followed by nephrology.  6 H/O dilated aortic root-not evident on CT dated June 2021.  7 metastatic renal cell carcinoma-Per oncology.  Kirk Ruths, MD

## 2021-11-29 ENCOUNTER — Other Ambulatory Visit: Payer: Self-pay | Admitting: Sports Medicine

## 2021-11-29 DIAGNOSIS — E1121 Type 2 diabetes mellitus with diabetic nephropathy: Secondary | ICD-10-CM

## 2021-12-04 ENCOUNTER — Ambulatory Visit (INDEPENDENT_AMBULATORY_CARE_PROVIDER_SITE_OTHER): Payer: Medicare Other | Admitting: Cardiology

## 2021-12-04 ENCOUNTER — Encounter: Payer: Self-pay | Admitting: Cardiology

## 2021-12-04 VITALS — BP 108/74 | HR 100 | Ht 67.0 in | Wt 242.0 lb

## 2021-12-04 DIAGNOSIS — I10 Essential (primary) hypertension: Secondary | ICD-10-CM | POA: Diagnosis not present

## 2021-12-04 DIAGNOSIS — E7800 Pure hypercholesterolemia, unspecified: Secondary | ICD-10-CM

## 2021-12-04 DIAGNOSIS — I480 Paroxysmal atrial fibrillation: Secondary | ICD-10-CM | POA: Diagnosis not present

## 2021-12-04 DIAGNOSIS — I2510 Atherosclerotic heart disease of native coronary artery without angina pectoris: Secondary | ICD-10-CM

## 2021-12-04 NOTE — Patient Instructions (Signed)

## 2021-12-05 ENCOUNTER — Other Ambulatory Visit: Payer: Self-pay

## 2021-12-05 ENCOUNTER — Other Ambulatory Visit: Payer: Self-pay | Admitting: Sports Medicine

## 2021-12-05 DIAGNOSIS — C649 Malignant neoplasm of unspecified kidney, except renal pelvis: Secondary | ICD-10-CM

## 2021-12-05 MED ORDER — TAMSULOSIN HCL 0.4 MG PO CAPS: 0.8000 mg | ORAL_CAPSULE | Freq: Every day | ORAL | 1 refills | Status: DC

## 2021-12-23 ENCOUNTER — Other Ambulatory Visit: Payer: Self-pay | Admitting: Sports Medicine

## 2021-12-23 DIAGNOSIS — I10 Essential (primary) hypertension: Secondary | ICD-10-CM

## 2022-01-01 ENCOUNTER — Encounter: Payer: Self-pay | Admitting: General Practice

## 2022-01-05 ENCOUNTER — Other Ambulatory Visit: Payer: Self-pay | Admitting: Sports Medicine

## 2022-01-05 DIAGNOSIS — J440 Chronic obstructive pulmonary disease with acute lower respiratory infection: Secondary | ICD-10-CM

## 2022-02-10 ENCOUNTER — Other Ambulatory Visit: Payer: Self-pay | Admitting: Sports Medicine

## 2022-02-10 DIAGNOSIS — J440 Chronic obstructive pulmonary disease with acute lower respiratory infection: Secondary | ICD-10-CM

## 2022-02-22 ENCOUNTER — Other Ambulatory Visit: Payer: Self-pay | Admitting: Sports Medicine

## 2022-02-25 ENCOUNTER — Other Ambulatory Visit: Payer: Self-pay | Admitting: Sports Medicine

## 2022-02-25 DIAGNOSIS — I10 Essential (primary) hypertension: Secondary | ICD-10-CM

## 2022-03-14 ENCOUNTER — Telehealth: Payer: Self-pay | Admitting: General Practice

## 2022-03-14 NOTE — Telephone Encounter (Signed)
Transition Care Management Follow-up Telephone Call Date of discharge and from where: 03/13/22 from [place] How have you been since you were released from the hospital? His daughter says that he is doing much better. Any questions or concerns? No  Items Reviewed: Did the pt receive and understand the discharge instructions provided? Yes  Medications obtained and verified? Yes  Other? No  Any new allergies since your discharge? No  Dietary orders reviewed? Yes Do you have support at home? Yes   Home Care and Equipment/Supplies: Were home health services ordered? no   Functional Questionnaire: (I = Independent and D = Dependent) ADLs: I  Bathing/Dressing- I  Meal Prep- I  Eating- I  Maintaining continence- I  Transferring/Ambulation- I  Managing Meds- I  Follow up appointments reviewed:  PCP Hospital f/u appt confirmed? No   Specialist Hospital f/u appt confirmed? Yes  Scheduled to see the pulmonologist; his daughter is going to call Dr. Jacalyn Lefevre office to make an appointment. Are transportation arrangements needed? No  If their condition worsens, is the pt aware to call PCP or go to the Emergency Dept.? Yes Was the patient provided with contact information for the PCP's office or ED? Yes Was to pt encouraged to call back with questions or concerns? Yes

## 2022-04-21 ENCOUNTER — Other Ambulatory Visit: Payer: Self-pay | Admitting: Sports Medicine

## 2022-04-21 DIAGNOSIS — K219 Gastro-esophageal reflux disease without esophagitis: Secondary | ICD-10-CM

## 2022-04-23 ENCOUNTER — Other Ambulatory Visit: Payer: Self-pay

## 2022-04-23 ENCOUNTER — Other Ambulatory Visit: Payer: Self-pay | Admitting: Sports Medicine

## 2022-04-23 DIAGNOSIS — K219 Gastro-esophageal reflux disease without esophagitis: Secondary | ICD-10-CM

## 2022-04-23 MED ORDER — AZELASTINE HCL 0.1 % NA SOLN: 2.0000 | Freq: Two times a day (BID) | NASAL | 1 refills | Status: DC | PRN

## 2022-04-23 NOTE — Telephone Encounter (Signed)
Med refill

## 2022-04-24 ENCOUNTER — Other Ambulatory Visit: Payer: Self-pay | Admitting: Sports Medicine

## 2022-04-24 MED ORDER — AZELASTINE HCL 0.1 % NA SOLN: 2.0000 | Freq: Two times a day (BID) | NASAL | 11 refills | Status: DC | PRN

## 2022-04-25 ENCOUNTER — Other Ambulatory Visit: Payer: Self-pay | Admitting: Sports Medicine

## 2022-04-25 DIAGNOSIS — I10 Essential (primary) hypertension: Secondary | ICD-10-CM

## 2022-04-29 ENCOUNTER — Ambulatory Visit (INDEPENDENT_AMBULATORY_CARE_PROVIDER_SITE_OTHER): Payer: Medicare Other | Admitting: Sports Medicine

## 2022-04-29 ENCOUNTER — Encounter: Payer: Self-pay | Admitting: Sports Medicine

## 2022-04-29 DIAGNOSIS — I2510 Atherosclerotic heart disease of native coronary artery without angina pectoris: Secondary | ICD-10-CM

## 2022-04-29 DIAGNOSIS — E1121 Type 2 diabetes mellitus with diabetic nephropathy: Secondary | ICD-10-CM | POA: Diagnosis not present

## 2022-04-29 NOTE — Assessment & Plan Note (Addendum)
Very pleasant 81 year old male, here to recheck his labs, we have not gotten routine labs in a while so we will check everything. He continues with Trulicity 3 mg weekly. Was recently on prednisone for a pulmonary illness and has had a few blood sugars in the 350s, will see if we need to adjust Trulicity because of this. He has been off of prednisone for about a week and a half.  Update: Blood sugars uncontrolled, increasing Trulicity to 4.5 mg

## 2022-04-29 NOTE — Progress Notes (Addendum)
    Procedures performed today:    None.  Independent interpretation of notes and tests performed by another provider:   None.  Brief History, Exam, Impression, and Recommendations:    Controlled type 2 diabetes mellitus with diabetic nephropathy [facility]) Very pleasant 81 year old male, here to recheck his labs, we have not gotten routine labs in a while so we will check everything. He continues with Trulicity 3 mg weekly. Was recently on prednisone for a pulmonary illness and has had a few blood sugars in the 350s, will see if we need to adjust Trulicity because of this. He has been off of prednisone for about a week and a half.  Update: Blood sugars uncontrolled, increasing Trulicity to 4.5 mg    ____________________________________________ Gwen Her. Dianah Field, M.D., ABFM., CAQSM., AME. Primary Care and Sports Medicine  [facility]  Adjunct Professor of [place] of [facility] of Medicine  Risk manager

## 2022-04-30 LAB — MICROALBUMIN / CREATININE URINE RATIO
Creatinine, Urine: 164 mg/dL (ref 20–320)
Microalb Creat Ratio: 15 mcg/mg creat (ref <30)
Microalb, Ur: 2.4 mg/dL

## 2022-05-02 LAB — CBC
HCT: 42.1 % (ref 38.5–50.0)
Hemoglobin: 14.4 g/dL (ref 13.2–17.1)
MCH: 32.3 pg (ref 27.0–33.0)
MCHC: 34.2 g/dL (ref 32.0–36.0)
MCV: 94.4 fL (ref 80.0–100.0)
MPV: 13.2 fL — ABNORMAL HIGH (ref 7.5–12.5)
Platelets: 159 10*3/uL (ref 140–400)
RBC: 4.46 10*6/uL (ref 4.20–5.80)
RDW: 12.5 % (ref 11.0–15.0)
WBC: 6.5 10*3/uL (ref 3.8–10.8)

## 2022-05-02 LAB — COMPLETE METABOLIC PANEL WITH GFR
AG Ratio: 1.3 (calc) (ref 1.0–2.5)
ALT: 22 U/L (ref 9–46)
AST: 17 U/L (ref 10–35)
Albumin: 3.9 g/dL (ref 3.6–5.1)
Alkaline phosphatase (APISO): 87 U/L (ref 35–144)
BUN/Creatinine Ratio: 10 (calc) (ref 6–22)
BUN: 24 mg/dL (ref 7–25)
CO2: 25 mmol/L (ref 20–32)
Calcium: 8.9 mg/dL (ref 8.6–10.3)
Chloride: 103 mmol/L (ref 98–110)
Creat: 2.41 mg/dL — ABNORMAL HIGH (ref 0.70–1.22)
Globulin: 3 g/dL (calc) (ref 1.9–3.7)
Glucose, Bld: 188 mg/dL — ABNORMAL HIGH (ref 65–99)
Potassium: 4.4 mmol/L (ref 3.5–5.3)
Sodium: 138 mmol/L (ref 135–146)
Total Bilirubin: 0.4 mg/dL (ref 0.2–1.2)
Total Protein: 6.9 g/dL (ref 6.1–8.1)
eGFR: 26 mL/min/{1.73_m2} — ABNORMAL LOW (ref >=60)

## 2022-05-02 LAB — LIPID PANEL
Cholesterol: 163 mg/dL (ref <200)
HDL: 41 mg/dL (ref >=40)
LDL Cholesterol (Calc): 83 mg/dL (calc)
Non-HDL Cholesterol (Calc): 122 mg/dL (calc) (ref <130)
Total CHOL/HDL Ratio: 4 (calc) (ref <5.0)
Triglycerides: 324 mg/dL — ABNORMAL HIGH (ref <150)

## 2022-05-02 LAB — HEMOGLOBIN A1C
Hgb A1c MFr Bld: 9.4 % of total Hgb — ABNORMAL HIGH (ref <5.7)
Mean Plasma Glucose: 223 mg/dL
eAG (mmol/L): 12.4 mmol/L

## 2022-05-02 LAB — TSH: TSH: 2.73 mIU/L (ref 0.40–4.50)

## 2022-05-08 ENCOUNTER — Other Ambulatory Visit: Payer: Self-pay | Admitting: Sports Medicine

## 2022-05-08 DIAGNOSIS — E1121 Type 2 diabetes mellitus with diabetic nephropathy: Secondary | ICD-10-CM

## 2022-05-08 MED ORDER — TRULICITY 4.5 MG/0.5ML ~~LOC~~ SOAJ: 4.5000 mg | SUBCUTANEOUS | 3 refills | Status: DC

## 2022-05-08 NOTE — Addendum Note (Signed)
Addended by: Silverio Decamp on: 05/08/2022 03:39 PM   Modules accepted: Orders

## 2022-05-14 ENCOUNTER — Other Ambulatory Visit: Payer: Self-pay | Admitting: Sports Medicine

## 2022-05-16 ENCOUNTER — Telehealth: Payer: Self-pay | Admitting: General Practice

## 2022-05-16 NOTE — Telephone Encounter (Signed)
Transition Care Management Unsuccessful Follow-up Telephone Call  Date of discharge and from where:  05/15/22 from [facility]  Attempts:  1st Attempt  Reason for unsuccessful TCM follow-up call:  Left voice message Needs one week hospital follow up.

## 2022-05-19 NOTE — Telephone Encounter (Signed)
Transition Care Management Unsuccessful Follow-up Telephone Call  Date of discharge and from where:  05/15/22 from [facility]  Attempts:  2nd Attempt  Reason for unsuccessful TCM follow-up call:  Left voice message

## 2022-05-21 ENCOUNTER — Ambulatory Visit (INDEPENDENT_AMBULATORY_CARE_PROVIDER_SITE_OTHER): Payer: Medicare Other | Admitting: Sports Medicine

## 2022-05-21 VITALS — BP 145/81 | HR 91

## 2022-05-21 DIAGNOSIS — K5904 Chronic idiopathic constipation: Secondary | ICD-10-CM

## 2022-05-21 DIAGNOSIS — H6123 Impacted cerumen, bilateral: Secondary | ICD-10-CM

## 2022-05-21 MED ORDER — POLYETHYLENE GLYCOL 3350 17 G PO PACK: 17.0000 g | PACK | Freq: Two times a day (BID) | ORAL | 11 refills | Status: AC

## 2022-05-21 NOTE — Assessment & Plan Note (Signed)
Aggressive irrigation today, return as needed.

## 2022-05-21 NOTE — Progress Notes (Signed)
    Procedures performed today:    Indication: Cerumen impaction of the ear(s) Medical necessity statement: On physical examination, cerumen impairs clinically significant portions of the external auditory canal, and tympanic membrane. Noted obstructive, copious cerumen that cannot be removed without magnification and instrumentations requiring physician skills Consent: Discussed benefits and risks of procedure and verbal consent obtained Procedure: Patient was prepped for the procedure. Utilized an otoscope to assess and take note of the ear canal, the tympanic membrane, and the presence, amount, and placement of the cerumen. Gentle water irrigation was utilized to remove cerumen.  Post procedure examination: shows cerumen was completely removed. Patient tolerated procedure well. The patient is made aware that they may experience temporary vertigo, temporary hearing loss, and temporary discomfort. If these symptom last for more than 24 hours to call the clinic or proceed to the ED.  Independent interpretation of notes and tests performed by another provider:   None.  Brief History, Exam, Impression, and Recommendations:    Bilateral hearing loss due to cerumen impaction Aggressive irrigation today, return as needed.  Chronic idiopathic constipation Currently taking Senokot-S twice a day, adding MiraLAX. He did have an ED visit for lower abdominal pain, urinalysis was negative, labs were unrevealing with the exception of stable renal insufficiency, CT abdomen pelvis was negative. He does endorse difficult to pass stools, I think this was the cause of his problem. Abdominal exam today was normal. We will add MiraLAX 17 g daily and he will continue Senokot-S twice a day.    ____________________________________________ Gwen Her. Dianah Field, M.D., ABFM., CAQSM., AME. Primary Care and Sports Medicine [facility] [facility]  Adjunct Professor of [place]  of [facility] of Medicine  Risk manager

## 2022-05-21 NOTE — Assessment & Plan Note (Signed)
Currently taking Senokot-S twice a day, adding MiraLAX. He did have an ED visit for lower abdominal pain, urinalysis was negative, labs were unrevealing with the exception of stable renal insufficiency, CT abdomen pelvis was negative. He does endorse difficult to pass stools, I think this was the cause of his problem. Abdominal exam today was normal. We will add MiraLAX 17 g daily and he will continue Senokot-S twice a day.

## 2022-05-23 NOTE — Telephone Encounter (Signed)
Transition Care Management Follow-up Telephone Call Date of discharge and from where: 05/15/22 from [place] How have you been since you were released from the hospital? Patient had OV wit pcp. Any questions or concerns? No

## 2022-05-24 ENCOUNTER — Other Ambulatory Visit: Payer: Self-pay | Admitting: Sports Medicine

## 2022-06-08 ENCOUNTER — Other Ambulatory Visit: Payer: Self-pay | Admitting: Sports Medicine

## 2022-06-08 DIAGNOSIS — J449 Chronic obstructive pulmonary disease, unspecified: Secondary | ICD-10-CM

## 2022-06-12 ENCOUNTER — Other Ambulatory Visit: Payer: Self-pay | Admitting: Sports Medicine

## 2022-06-12 DIAGNOSIS — E7800 Pure hypercholesterolemia, unspecified: Secondary | ICD-10-CM

## 2022-06-16 LAB — HM DIABETES EYE EXAM

## 2022-06-19 ENCOUNTER — Encounter: Payer: Self-pay | Admitting: Sports Medicine

## 2022-06-25 ENCOUNTER — Other Ambulatory Visit: Payer: Self-pay | Admitting: Sports Medicine

## 2022-06-25 DIAGNOSIS — J449 Chronic obstructive pulmonary disease, unspecified: Secondary | ICD-10-CM

## 2022-06-28 ENCOUNTER — Other Ambulatory Visit: Payer: Self-pay | Admitting: Sports Medicine

## 2022-06-28 DIAGNOSIS — I10 Essential (primary) hypertension: Secondary | ICD-10-CM

## 2022-07-03 ENCOUNTER — Other Ambulatory Visit: Payer: Self-pay | Admitting: Sports Medicine

## 2022-07-03 DIAGNOSIS — J440 Chronic obstructive pulmonary disease with acute lower respiratory infection: Secondary | ICD-10-CM

## 2022-07-14 ENCOUNTER — Other Ambulatory Visit: Payer: Self-pay | Admitting: Sports Medicine

## 2022-07-14 DIAGNOSIS — J449 Chronic obstructive pulmonary disease, unspecified: Secondary | ICD-10-CM

## 2022-07-22 ENCOUNTER — Telehealth: Payer: Self-pay | Admitting: Sports Medicine

## 2022-07-22 NOTE — Telephone Encounter (Signed)
Called patient to schedule Medicare Annual Wellness Visit (AWV). Unable to reach patient.  Last date of AWV: 06/04/2017  Please schedule an appointment at any time with Nurse Health Advisor.  If any questions, please contact me at [phone].  Thank you ,  Lin Givens Patient Access Advocate II Direct Dial: [phone]

## 2022-07-24 ENCOUNTER — Other Ambulatory Visit: Payer: Self-pay | Admitting: Sports Medicine

## 2022-07-24 DIAGNOSIS — K219 Gastro-esophageal reflux disease without esophagitis: Secondary | ICD-10-CM

## 2022-08-02 ENCOUNTER — Other Ambulatory Visit: Payer: Self-pay | Admitting: Sports Medicine

## 2022-08-02 DIAGNOSIS — J449 Chronic obstructive pulmonary disease, unspecified: Secondary | ICD-10-CM

## 2022-08-20 ENCOUNTER — Other Ambulatory Visit: Payer: Self-pay | Admitting: Sports Medicine

## 2022-08-20 DIAGNOSIS — E1121 Type 2 diabetes mellitus with diabetic nephropathy: Secondary | ICD-10-CM

## 2022-08-21 ENCOUNTER — Ambulatory Visit (INDEPENDENT_AMBULATORY_CARE_PROVIDER_SITE_OTHER): Payer: Medicare Other | Admitting: Sports Medicine

## 2022-08-21 ENCOUNTER — Encounter: Payer: Self-pay | Admitting: Sports Medicine

## 2022-08-21 VITALS — BP 129/70 | HR 84 | Temp 97.9°F | Ht 67.0 in

## 2022-08-21 DIAGNOSIS — L03312 Cellulitis of back [any part except buttock]: Secondary | ICD-10-CM

## 2022-08-21 MED ORDER — DOXYCYCLINE HYCLATE 100 MG PO TABS: 100.0000 mg | ORAL_TABLET | Freq: Two times a day (BID) | ORAL | 0 refills | Status: AC

## 2022-08-21 NOTE — Progress Notes (Signed)
    Procedures performed today:    None.  Independent interpretation of notes and tests performed by another provider:   None.  Brief History, Exam, Impression, and Recommendations:    Cellulitis of back Telvin had what sounds to be an abscess, it drained, now has some left upper back erythema, on exam it looks like a cellulitis now, no fluctuance. Adding doxycycline, return to see me if not better in 1 to 2 weeks.    ____________________________________________ Ihor Austin. Benjamin Stain, M.D., ABFM., CAQSM., AME. Primary Care and Sports Medicine [place] [facility]  Adjunct Professor of Family Medicine  [place] [employer] [facility] [employer] [facility] [employer] of Medicine  Restaurant manager, fast food

## 2022-08-21 NOTE — Assessment & Plan Note (Signed)
Clinton Ortiz had what sounds to be an abscess, it drained, now has some left upper back erythema, on exam it looks like a cellulitis now, no fluctuance. Adding doxycycline, return to see me if not better in 1 to 2 weeks.

## 2022-08-31 ENCOUNTER — Other Ambulatory Visit: Payer: Self-pay | Admitting: Sports Medicine

## 2022-08-31 DIAGNOSIS — I10 Essential (primary) hypertension: Secondary | ICD-10-CM

## 2022-09-02 ENCOUNTER — Telehealth: Payer: Self-pay | Admitting: Sports Medicine

## 2022-09-02 NOTE — Telephone Encounter (Signed)
Signed and placed in my box.

## 2022-09-02 NOTE — Telephone Encounter (Signed)
Patient dropped off a medical release form to be signed. Paperwork left in Dr. Lucienne Minks box.

## 2022-09-03 ENCOUNTER — Other Ambulatory Visit: Payer: Self-pay | Admitting: Sports Medicine

## 2022-09-03 DIAGNOSIS — J449 Chronic obstructive pulmonary disease, unspecified: Secondary | ICD-10-CM

## 2022-09-03 DIAGNOSIS — K219 Gastro-esophageal reflux disease without esophagitis: Secondary | ICD-10-CM

## 2022-09-08 ENCOUNTER — Encounter: Payer: Self-pay | Admitting: Sports Medicine

## 2022-09-08 ENCOUNTER — Ambulatory Visit (INDEPENDENT_AMBULATORY_CARE_PROVIDER_SITE_OTHER): Payer: Medicare Other | Admitting: Sports Medicine

## 2022-09-08 VITALS — BP 133/50 | HR 71 | Ht 67.0 in | Wt 239.0 lb

## 2022-09-08 DIAGNOSIS — L03312 Cellulitis of back [any part except buttock]: Secondary | ICD-10-CM | POA: Diagnosis not present

## 2022-09-08 DIAGNOSIS — E1121 Type 2 diabetes mellitus with diabetic nephropathy: Secondary | ICD-10-CM | POA: Diagnosis not present

## 2022-09-08 LAB — POCT GLYCOSYLATED HEMOGLOBIN (HGB A1C): Hemoglobin A1C: 8 % — AB (ref 4.0–5.6)

## 2022-09-08 NOTE — Progress Notes (Signed)
    Procedures performed today:    None.  Independent interpretation of notes and tests performed by another provider:   None.  Brief History, Exam, Impression, and Recommendations:    Controlled type 2 diabetes mellitus with diabetic nephropathy (HCC) A1c improved to 8% from 9.4% with the increase to 4.5 of Trulicity, up-to-date on screening measures.  Return in 6 months for repeat A1c.  Cellulitis of back For the most part resolved, last seen 2 weeks ago, he had a week of doxycycline. Return as needed for this.  I spent 30 minutes of total time managing this patient today, this includes chart review, face to face, and non-face to face time.  ____________________________________________ Ihor Austin. Benjamin Stain, M.D., ABFM., CAQSM., AME. Primary Care and Sports Medicine  [facility]  Adjunct Professor of Family Medicine  [place] [employer] [facility] of Medicine  Restaurant manager, fast food

## 2022-09-08 NOTE — Assessment & Plan Note (Signed)
A1c improved to 8% from 9.4% with the increase to 4.5 of Trulicity, up-to-date on screening measures.  Return in 6 months for repeat A1c.

## 2022-09-08 NOTE — Assessment & Plan Note (Signed)
For the most part resolved, last seen 2 weeks ago, he had a week of doxycycline. Return as needed for this.

## 2022-10-07 ENCOUNTER — Ambulatory Visit (INDEPENDENT_AMBULATORY_CARE_PROVIDER_SITE_OTHER): Payer: Medicare Other | Admitting: Family Medicine

## 2022-10-07 ENCOUNTER — Encounter: Payer: Self-pay | Admitting: Family Medicine

## 2022-10-07 ENCOUNTER — Ambulatory Visit: Payer: Self-pay

## 2022-10-07 ENCOUNTER — Ambulatory Visit (INDEPENDENT_AMBULATORY_CARE_PROVIDER_SITE_OTHER): Payer: Medicare Other

## 2022-10-07 VITALS — BP 116/62 | HR 85 | Temp 97.7°F | Ht 67.0 in | Wt 230.0 lb

## 2022-10-07 DIAGNOSIS — R0602 Shortness of breath: Secondary | ICD-10-CM

## 2022-10-07 DIAGNOSIS — R0789 Other chest pain: Secondary | ICD-10-CM | POA: Diagnosis not present

## 2022-10-07 DIAGNOSIS — R112 Nausea with vomiting, unspecified: Secondary | ICD-10-CM | POA: Diagnosis not present

## 2022-10-07 LAB — CBC WITH DIFFERENTIAL/PLATELET
Basophils Relative: 0.5 %
HCT: 41 % (ref 38.5–50.0)
Hemoglobin: 13.5 g/dL (ref 13.2–17.1)
MCH: 30.5 pg (ref 27.0–33.0)
Neutrophils Relative %: 74.5 %

## 2022-10-07 MED ORDER — ONDANSETRON HCL 4 MG PO TABS: 4.0000 mg | ORAL_TABLET | Freq: Three times a day (TID) | ORAL | 0 refills | Status: AC | PRN

## 2022-10-07 NOTE — Progress Notes (Signed)
Acute Office Visit  Subjective:     Patient ID: Clinton Ortiz, male    DOB: June 17, 1940, 82 y.o.   MRN: [id number]  No chief complaint on file.   HPI Patient is in today for chest pain and vomiting that started on Friday, 4 days ago.  Hx of CAD and Afib.  He describes the chest pain as feeling like razor blade was going up his midsternal area just slightly to the right.  It started after the vomiting started and continued to hurt throughout the weekend. Has had some constipation but says he had diarrhea after he got here today. .  + high of chronic cough.  Last A1C was 8.09 which was 4 weeks ago.  Better than last December.    No other sick contracts.    ROS      Objective:    BP 116/62   Pulse 85   Temp 97.7 F (36.5 C)   Ht 5\' 7"  (1.702 m)   Wt 230 lb (104.3 kg)   SpO2 93%   BMI 36.02 kg/m    Physical Exam Constitutional:      Appearance: He is well-developed.  HENT:     Head: Normocephalic and atraumatic.     Right Ear: External ear normal.     Left Ear: External ear normal.     Nose: Nose normal.     Mouth/Throat:     Pharynx: Oropharynx is clear.  Eyes:     Conjunctiva/sclera: Conjunctivae normal.     Pupils: Pupils are equal, round, and reactive to light.  Neck:     Thyroid: No thyromegaly.  Cardiovascular:     Rate and Rhythm: Normal rate and regular rhythm.     Heart sounds: Normal heart sounds.  Pulmonary:     Effort: Pulmonary effort is normal.     Breath sounds: Normal breath sounds.     Comments: Upper rhonchi in the left upper chest.   Abdominal:     General: Bowel sounds are normal.     Palpations: Abdomen is soft.     Tenderness: There is no abdominal tenderness.  Musculoskeletal:     Cervical back: Neck supple.  Lymphadenopathy:     Cervical: No cervical adenopathy.  Skin:    General: Skin is warm and dry.  Neurological:     Mental Status: He is alert and oriented to person, place, and time.  Psychiatric:        Behavior: Behavior  normal.     No results found for any visits on 10/07/22.      Assessment & Plan:   Problem List Items Addressed This Visit   None Visit Diagnoses     SOB (shortness of breath)    -  Primary   Relevant Orders   CBC with Differential/Platelet   COMPLETE METABOLIC PANEL WITH GFR   Lipase   Nausea and vomiting, unspecified vomiting type       Relevant Orders   CBC with Differential/Platelet   COMPLETE METABOLIC PANEL WITH GFR   Lipase   Atypical chest pain       Relevant Orders   CBC with Differential/Platelet   COMPLETE METABOLIC PANEL WITH GFR   Lipase   DG Chest 2 View       Shortness of breath-will get chest x-ray today for further workup I am here a little bit of rhonchi in the left upper lobe.  Nausea/vomiting-it actually seems a little better today he actually had a pretty decent  appetite and was able to eat and keep breakfast down this morning.  We discussed that it could be viral versus food poisoning unclear etiology again we will send over Zofran to use as needed.  He is on a GLP-1 so we will also check for pancreatitis.  Atypical chest pain-EKG today shows rate of 80 bpm with first-degree AV block which is not new and incomplete right bundle branch block which is also not new compared to prior EKG from July 2023.  Meds ordered this encounter  Medications   ondansetron (ZOFRAN) 4 MG tablet    Sig: Take 1 tablet (4 mg total) by mouth every 8 (eight) hours as needed for nausea or vomiting.    Dispense:  10 tablet    Refill:  0    Return if symptoms worsen or fail to improve.  Nani Gasser, MD

## 2022-10-08 ENCOUNTER — Encounter: Payer: Self-pay | Admitting: Family Medicine

## 2022-10-08 DIAGNOSIS — N184 Chronic kidney disease, stage 4 (severe): Secondary | ICD-10-CM | POA: Insufficient documentation

## 2022-10-08 LAB — COMPLETE METABOLIC PANEL WITH GFR
AG Ratio: 1.4 (calc) (ref 1.0–2.5)
ALT: 15 U/L (ref 9–46)
AST: 17 U/L (ref 10–35)
Albumin: 3.8 g/dL (ref 3.6–5.1)
Alkaline phosphatase (APISO): 94 U/L (ref 35–144)
BUN/Creatinine Ratio: 12 (calc) (ref 6–22)
BUN: 28 mg/dL — ABNORMAL HIGH (ref 7–25)
CO2: 21 mmol/L (ref 20–32)
Calcium: 9.5 mg/dL (ref 8.6–10.3)
Chloride: 103 mmol/L (ref 98–110)
Creat: 2.37 mg/dL — ABNORMAL HIGH (ref 0.70–1.22)
Globulin: 2.8 g/dL (calc) (ref 1.9–3.7)
Glucose, Bld: 134 mg/dL — ABNORMAL HIGH (ref 65–99)
Potassium: 4.3 mmol/L (ref 3.5–5.3)
Sodium: 137 mmol/L (ref 135–146)
Total Bilirubin: 0.4 mg/dL (ref 0.2–1.2)
Total Protein: 6.6 g/dL (ref 6.1–8.1)
eGFR: 27 mL/min/{1.73_m2} — ABNORMAL LOW (ref >=60)

## 2022-10-08 LAB — CBC WITH DIFFERENTIAL/PLATELET
Absolute Monocytes: 854 cells/uL (ref 200–950)
Basophils Absolute: 45 cells/uL (ref 0–200)
Eosinophils Absolute: 409 cells/uL (ref 15–500)
Eosinophils Relative: 4.6 %
Lymphs Abs: 961 cells/uL (ref 850–3900)
MCHC: 32.9 g/dL (ref 32.0–36.0)
MCV: 92.6 fL (ref 80.0–100.0)
MPV: 12.7 fL — ABNORMAL HIGH (ref 7.5–12.5)
Monocytes Relative: 9.6 %
Neutro Abs: 6631 cells/uL (ref 1500–7800)
Platelets: 214 10*3/uL (ref 140–400)
RBC: 4.43 10*6/uL (ref 4.20–5.80)
RDW: 12.4 % (ref 11.0–15.0)
Total Lymphocyte: 10.8 %
WBC: 8.9 10*3/uL (ref 3.8–10.8)

## 2022-10-08 LAB — LIPASE: Lipase: 10 U/L (ref 7–60)

## 2022-10-08 NOTE — Progress Notes (Signed)
Clinton Ortiz, your blood count looks reassuring.  No sign of acute infection so I do think this was probably more viral or something that you may have eaten.  Kidney function is stable around 2.3.  Liver function is normal no sign of pancreatitis or hepatitis.  Hopefully you are continuing to feel a little better today.

## 2022-10-09 NOTE — Progress Notes (Signed)
Hi Godson, great news!  Chest x-ray actually looks good.  Please call patient and see if he is feeling better today he was feeling a little bit better when I saw him on the 28th so just 1 to make sure that he is continuing to improve or if we need to do further workup.

## 2022-10-15 NOTE — Addendum Note (Signed)
Addended by: Chalmers Cater on: 10/15/2022 02:03 PM   Modules accepted: Orders

## 2022-10-17 ENCOUNTER — Other Ambulatory Visit: Payer: Self-pay | Admitting: Sports Medicine

## 2022-10-20 ENCOUNTER — Telehealth: Payer: Self-pay | Admitting: Sports Medicine

## 2022-10-20 DIAGNOSIS — J449 Chronic obstructive pulmonary disease, unspecified: Secondary | ICD-10-CM

## 2022-10-20 MED ORDER — AMBULATORY NON FORMULARY MEDICATION: 0 refills | Status: DC

## 2022-10-20 NOTE — Telephone Encounter (Signed)
Rx in box. 

## 2022-10-20 NOTE — Telephone Encounter (Signed)
Daughter called. Requesting a new rx to [facility] for oxygen.

## 2022-10-28 ENCOUNTER — Ambulatory Visit: Payer: Medicare Other | Admitting: Sports Medicine

## 2022-10-31 ENCOUNTER — Other Ambulatory Visit: Payer: Self-pay | Admitting: Sports Medicine

## 2022-10-31 DIAGNOSIS — I10 Essential (primary) hypertension: Secondary | ICD-10-CM

## 2022-11-20 ENCOUNTER — Other Ambulatory Visit: Payer: Self-pay | Admitting: Sports Medicine

## 2022-11-20 DIAGNOSIS — E1121 Type 2 diabetes mellitus with diabetic nephropathy: Secondary | ICD-10-CM

## 2022-11-20 DIAGNOSIS — F4321 Adjustment disorder with depressed mood: Secondary | ICD-10-CM

## 2022-11-28 ENCOUNTER — Telehealth: Payer: Self-pay | Admitting: Sports Medicine

## 2022-11-28 NOTE — Telephone Encounter (Signed)
Patient's daughter called to follow up on oxygen from [facility] in [facility] She is requesting an order be placed  Please advise

## 2022-12-01 MED ORDER — AMBULATORY NON FORMULARY MEDICATION: 0 refills | Status: AC

## 2022-12-01 NOTE — Telephone Encounter (Signed)
Prescription written and will be faxed.

## 2022-12-02 ENCOUNTER — Other Ambulatory Visit: Payer: Self-pay | Admitting: Sports Medicine

## 2022-12-02 ENCOUNTER — Telehealth: Payer: Self-pay | Admitting: Sports Medicine

## 2022-12-02 DIAGNOSIS — E1121 Type 2 diabetes mellitus with diabetic nephropathy: Secondary | ICD-10-CM

## 2022-12-02 DIAGNOSIS — K219 Gastro-esophageal reflux disease without esophagitis: Secondary | ICD-10-CM

## 2022-12-02 MED ORDER — FREESTYLE LIBRE 2 SENSOR MISC: 11 refills | Status: DC

## 2022-12-02 MED ORDER — FREESTYLE LIBRE 2 READER DEVI
1.0000 | Freq: Four times a day (QID) | 11 refills | Status: DC
Start: 2022-12-02 — End: 2023-12-28

## 2022-12-02 MED ORDER — PANTOPRAZOLE SODIUM 40 MG PO TBEC
40.0000 mg | DELAYED_RELEASE_TABLET | Freq: Every day | ORAL | 3 refills | Status: DC
Start: 2022-12-02 — End: 2023-12-04

## 2022-12-02 NOTE — Telephone Encounter (Signed)
Patient is requesting refills on Continuous Blood Gluc Reciever 2 sensors and Protonix 40mg  Please send to [facility] [place] [address] [place]

## 2022-12-02 NOTE — Telephone Encounter (Signed)
Done

## 2023-01-02 ENCOUNTER — Encounter: Payer: Self-pay | Admitting: Podiatry

## 2023-01-02 ENCOUNTER — Ambulatory Visit (INDEPENDENT_AMBULATORY_CARE_PROVIDER_SITE_OTHER): Payer: Medicare Other | Admitting: Podiatry

## 2023-01-02 DIAGNOSIS — B351 Tinea unguium: Secondary | ICD-10-CM | POA: Diagnosis not present

## 2023-01-02 DIAGNOSIS — M79675 Pain in left toe(s): Secondary | ICD-10-CM

## 2023-01-02 DIAGNOSIS — E1121 Type 2 diabetes mellitus with diabetic nephropathy: Secondary | ICD-10-CM | POA: Diagnosis not present

## 2023-01-02 DIAGNOSIS — M79674 Pain in right toe(s): Secondary | ICD-10-CM

## 2023-01-02 NOTE — Progress Notes (Signed)
  Subjective:  Patient ID: Clinton Ortiz, male    DOB: November 22, 1940,   MRN: [id number]  Chief Complaint  Patient presents with   Nail Problem    Pt present today for a diabetic foot care. Pt denies pain    82 y.o. male presents for concern of thickened elongated and painful nails that are difficult to trim. Requesting to have them trimmed today. Relates burning and tingling in their feet. Patient is diabetic and last A1c was  Lab Results  Component Value Date   HGBA1C 8.0 (A) 09/08/2022   .   PCP:  Monica Becton, MD    . Denies any other pedal complaints. Denies n/v/f/c.   Past Medical History:  Diagnosis Date   Arthritis    CAD (coronary artery disease)    07/01/2019: LHC with LM disease. Needs CABG.   Cancer [facility]) 2013   left kidney   Complication of anesthesia    slow to wake up after kidney stone surgery   Constipation, chronic    COPD (chronic obstructive pulmonary disease) (HCC)    Diabetes mellitus (HCC)    Dyspnea    with exertion   Family history of adverse reaction to anesthesia    daughter couldn't talk for 3-4 days after anesthesia    Frequent urination    GERD (gastroesophageal reflux disease)    Headache    Hernia    History of kidney stones    Hyperlipidemia    Hypertension    Peripheral vascular disease (HCC)    right leg blood clot 20 plus years ago   Pneumonia    Renal insufficiency    stage 2 (only 1 kidney) had Kidney cancer   Spondylolysis    Tongue cancer (HCC) 2011    Objective:  Physical Exam: .Vascular: DP/PT pulses 2/4 bilateral. CFT <3 seconds. Absent hair growth on digits. Edema noted to bilateral lower extremities. Xerosis noted bilaterally.  Skin. No lacerations or abrasions bilateral feet. Nails 1-5 bilateral  are thickened discolored and elongated with subungual debris.  Musculoskeletal: MMT 5/5 bilateral lower extremities in DF, PF, Inversion and Eversion. Deceased ROM in DF of ankle joint.  Neurological: Sensation intact to  light touch. Protective sensation diminished bilateral.    Assessment:   1. Pain due to onychomycosis of toenails of both feet   2. Controlled type 2 diabetes mellitus with diabetic nephropathy, unspecified whether long term insulin use (HCC)      Plan:  Patient was evaluated and treated and all questions answered. -Discussed and educated patient on diabetic foot care, especially with  regards to the vascular, neurological and musculoskeletal systems.  -Stressed the importance of good glycemic control and the detriment of not  controlling glucose levels in relation to the foot. -Discussed supportive shoes at all times and checking feet regularly.  -Mechanically debrided all nails 1-5 bilateral using sterile nail nipper and filed with dremel without incident  -Answered all patient questions -Patient to return  in 3 months for at risk foot care -Patient advised to call the office if any problems or questions arise in the meantime.   Louann Sjogren, DPM

## 2023-01-06 ENCOUNTER — Ambulatory Visit: Payer: Self-pay | Admitting: Podiatry

## 2023-01-30 ENCOUNTER — Other Ambulatory Visit: Payer: Self-pay | Admitting: Sports Medicine

## 2023-01-30 DIAGNOSIS — J440 Chronic obstructive pulmonary disease with acute lower respiratory infection: Secondary | ICD-10-CM

## 2023-02-02 ENCOUNTER — Other Ambulatory Visit: Payer: Self-pay | Admitting: Sports Medicine

## 2023-03-01 ENCOUNTER — Other Ambulatory Visit: Payer: Self-pay | Admitting: Sports Medicine

## 2023-03-01 DIAGNOSIS — I10 Essential (primary) hypertension: Secondary | ICD-10-CM

## 2023-03-08 ENCOUNTER — Other Ambulatory Visit: Payer: Self-pay | Admitting: Sports Medicine

## 2023-03-10 ENCOUNTER — Encounter: Payer: Self-pay | Admitting: Sports Medicine

## 2023-03-10 ENCOUNTER — Ambulatory Visit (INDEPENDENT_AMBULATORY_CARE_PROVIDER_SITE_OTHER): Payer: Medicare Other | Admitting: Sports Medicine

## 2023-03-10 VITALS — BP 107/69 | HR 73 | Ht 67.0 in | Wt 221.0 lb

## 2023-03-10 DIAGNOSIS — Z23 Encounter for immunization: Secondary | ICD-10-CM

## 2023-03-10 DIAGNOSIS — N400 Enlarged prostate without lower urinary tract symptoms: Secondary | ICD-10-CM

## 2023-03-10 DIAGNOSIS — E1121 Type 2 diabetes mellitus with diabetic nephropathy: Secondary | ICD-10-CM

## 2023-03-10 DIAGNOSIS — Z0000 Encounter for general adult medical examination without abnormal findings: Secondary | ICD-10-CM | POA: Diagnosis not present

## 2023-03-10 DIAGNOSIS — Z7984 Long term (current) use of oral hypoglycemic drugs: Secondary | ICD-10-CM

## 2023-03-10 LAB — POCT GLYCOSYLATED HEMOGLOBIN (HGB A1C): Hemoglobin A1C: 10.3 % — AB (ref 4.0–5.6)

## 2023-03-10 MED ORDER — GLIPIZIDE 5 MG PO TABS
5.0000 mg | ORAL_TABLET | Freq: Two times a day (BID) | ORAL | 3 refills | Status: AC
Start: 2023-03-10 — End: ?

## 2023-03-10 MED ORDER — TETANUS-DIPHTH-ACELL PERTUSSIS 5-2.5-18.5 LF-MCG/0.5 IM SUSY
0.5000 mL | PREFILLED_SYRINGE | Freq: Once | INTRAMUSCULAR | 0 refills | Status: AC
Start: 2023-03-10 — End: 2023-03-10

## 2023-03-10 MED ORDER — SHINGRIX 50 MCG/0.5ML IM SUSR: 0.5000 mL | Freq: Once | INTRAMUSCULAR | 0 refills | Status: AC

## 2023-03-10 NOTE — Assessment & Plan Note (Addendum)
Orthostatic with Flomax twice daily, decreasing to 1 pill daily. We will reevaluate his urinary symptoms at the follow-up visit. The addition of tadalafil also remains an option.

## 2023-03-10 NOTE — Progress Notes (Signed)
    Procedures performed today:    None.  Independent interpretation of notes and tests performed by another provider:   None.  Brief History, Exam, Impression, and Recommendations:    Controlled type 2 diabetes mellitus with diabetic nephropathy (HCC) Clinton Ortiz is currently on Trulicity 4.5 mg weekly, he does not miss any doses, A1c was 8% 6 months ago, 10.3% today unfortunately. Due to renal insufficiency we will avoid metformin, adding glipizide 5 mg twice daily with a 42-month recheck, increase to 10 mg twice daily if needed. Up-to-date on other diabetic screening measures.  Annual physical exam Did Zostavax in the distant past but has not done Shingrix, sending to the pharmacy, also sending Tdap.  Benign prostatic hyperplasia Orthostatic with Flomax twice daily, decreasing to 1 pill daily. We will reevaluate his urinary symptoms at the follow-up visit. The addition of tadalafil also remains an option.  Chronic process not at goal with pharmacologic intervention  ____________________________________________ Clinton Ortiz. Clinton Ortiz, M.D., ABFM., CAQSM., AME. Primary Care and Sports Medicine [facility]  Adjunct Professor of Family Medicine  [employer]  Restaurant manager, fast food

## 2023-03-10 NOTE — Assessment & Plan Note (Signed)
Did Zostavax in the distant past but has not done Shingrix, sending to the pharmacy, also sending Tdap.

## 2023-03-10 NOTE — Assessment & Plan Note (Signed)
Ova is currently on Trulicity 4.5 mg weekly, he does not miss any doses, A1c was 8% 6 months ago, 10.3% today unfortunately. Due to renal insufficiency we will avoid metformin, adding glipizide 5 mg twice daily with a 74-month recheck, increase to 10 mg twice daily if needed. Up-to-date on other diabetic screening measures.

## 2023-03-23 LAB — MICROALBUMIN / CREATININE URINE RATIO: Microalb Creat Ratio: 6

## 2023-03-30 ENCOUNTER — Other Ambulatory Visit: Payer: Self-pay | Admitting: Sports Medicine

## 2023-03-30 DIAGNOSIS — I10 Essential (primary) hypertension: Secondary | ICD-10-CM

## 2023-04-03 ENCOUNTER — Encounter: Payer: Self-pay | Admitting: Podiatry

## 2023-04-03 ENCOUNTER — Ambulatory Visit (INDEPENDENT_AMBULATORY_CARE_PROVIDER_SITE_OTHER): Payer: Medicare Other | Admitting: Podiatry

## 2023-04-03 DIAGNOSIS — M79674 Pain in right toe(s): Secondary | ICD-10-CM | POA: Diagnosis not present

## 2023-04-03 DIAGNOSIS — M79675 Pain in left toe(s): Secondary | ICD-10-CM

## 2023-04-03 DIAGNOSIS — B351 Tinea unguium: Secondary | ICD-10-CM | POA: Diagnosis not present

## 2023-04-03 DIAGNOSIS — E1121 Type 2 diabetes mellitus with diabetic nephropathy: Secondary | ICD-10-CM | POA: Diagnosis not present

## 2023-04-03 NOTE — Progress Notes (Signed)
  Subjective:  Patient ID: Clinton Ortiz, male    DOB: May 23, 1940,   MRN: [id number]  Chief Complaint  Patient presents with   Nail Problem    RFC.    82 y.o. male presents for concern of thickened elongated and painful nails that are difficult to trim. Requesting to have them trimmed today. Relates burning and tingling in their feet. Patient is diabetic and last A1c was  Lab Results  Component Value Date   HGBA1C 10.3 (A) 03/10/2023   .   PCP:  Monica Becton, MD    . Denies any other pedal complaints. Denies n/v/f/c.   Past Medical History:  Diagnosis Date   Arthritis    CAD (coronary artery disease)    07/01/2019: LHC with LM disease. Needs CABG.   Cancer [facility]) 2013   left kidney   Complication of anesthesia    slow to wake up after kidney stone surgery   Constipation, chronic    COPD (chronic obstructive pulmonary disease) (HCC)    Diabetes mellitus (HCC)    Dyspnea    with exertion   Family history of adverse reaction to anesthesia    daughter couldn't talk for 3-4 days after anesthesia    Frequent urination    GERD (gastroesophageal reflux disease)    Headache    Hernia    History of kidney stones    Hyperlipidemia    Hypertension    Peripheral vascular disease (HCC)    right leg blood clot 20 plus years ago   Pneumonia    Renal insufficiency    stage 2 (only 1 kidney) had Kidney cancer   Spondylolysis    Tongue cancer (HCC) 2011    Objective:  Physical Exam: .Vascular: DP/PT pulses 2/4 bilateral. CFT <3 seconds. Absent hair growth on digits. Edema noted to bilateral lower extremities. Xerosis noted bilaterally.  Skin. No lacerations or abrasions bilateral feet. Nails 1-5 bilateral  are thickened discolored and elongated with subungual debris.  Musculoskeletal: MMT 5/5 bilateral lower extremities in DF, PF, Inversion and Eversion. Deceased ROM in DF of ankle joint.  Neurological: Sensation intact to light touch. Protective sensation diminished bilateral.     Assessment:   1. Pain due to onychomycosis of toenails of both feet   2. Controlled type 2 diabetes mellitus with diabetic nephropathy, unspecified whether long term insulin use (HCC)      Plan:  Patient was evaluated and treated and all questions answered. -Discussed and educated patient on diabetic foot care, especially with  regards to the vascular, neurological and musculoskeletal systems.  -Stressed the importance of good glycemic control and the detriment of not  controlling glucose levels in relation to the foot. -Discussed supportive shoes at all times and checking feet regularly.  -Mechanically debrided all nails 1-5 bilateral using sterile nail nipper and filed with dremel without incident  -Answered all patient questions -Patient to return  in 3 months for at risk foot care -Patient advised to call the office if any problems or questions arise in the meantime.   Louann Sjogren, DPM

## 2023-05-04 ENCOUNTER — Other Ambulatory Visit: Payer: Self-pay | Admitting: Sports Medicine

## 2023-05-04 DIAGNOSIS — E1121 Type 2 diabetes mellitus with diabetic nephropathy: Secondary | ICD-10-CM

## 2023-05-07 ENCOUNTER — Telehealth: Payer: Self-pay

## 2023-05-07 ENCOUNTER — Other Ambulatory Visit: Payer: Self-pay | Admitting: Sports Medicine

## 2023-05-07 DIAGNOSIS — E1121 Type 2 diabetes mellitus with diabetic nephropathy: Secondary | ICD-10-CM

## 2023-05-07 DIAGNOSIS — E7800 Pure hypercholesterolemia, unspecified: Secondary | ICD-10-CM

## 2023-05-07 DIAGNOSIS — F4321 Adjustment disorder with depressed mood: Secondary | ICD-10-CM

## 2023-05-07 MED ORDER — SERTRALINE HCL 50 MG PO TABS: 50.0000 mg | ORAL_TABLET | Freq: Every day | ORAL | 3 refills | Status: DC

## 2023-05-07 NOTE — Telephone Encounter (Signed)
Copied from CRM [id number]. Topic: Clinical - Medication Refill >> May 07, 2023  2:40 PM Elle L wrote: Most Recent Primary Care Visit:  Provider: Monica Becton  Department: [facility] CARE [facility]  Visit Type: OFFICE VISIT  Date: 03/10/2023  Medication: sertraline (ZOLOFT) 50 MG tablet  Has the patient contacted their pharmacy? Yes (Agent: If no, request that the patient contact the pharmacy for the refill. If patient does not wish to contact the pharmacy document the reason why and proceed with request.) (Agent: If yes, when and what did the pharmacy advise?)  Is this the correct pharmacy for this prescription? Yes .  This is the patient's preferred pharmacy:  [phone] - [place], [place] - [address] [place] [address] Phone: [phone] Fax: [phone]  Has the prescription been filled recently? Yes  Is the patient out of the medication? Yes  Has the patient been seen for an appointment in the last year OR does the patient have an upcoming appointment? Yes  Can we respond through MyChart? Yes  Agent: Please be advised that Rx refills may take up to 3 business days. We ask that you follow-up with your pharmacy.

## 2023-05-07 NOTE — Telephone Encounter (Signed)
Sertraline refill sent to pharmacy today.

## 2023-05-11 LAB — COMPREHENSIVE METABOLIC PANEL: eGFR: 12

## 2023-05-14 ENCOUNTER — Other Ambulatory Visit: Payer: Self-pay | Admitting: Sports Medicine

## 2023-06-03 ENCOUNTER — Other Ambulatory Visit: Payer: Self-pay | Admitting: Sports Medicine

## 2023-06-04 NOTE — Progress Notes (Signed)
HPI: Follow-up coronary artery disease.  Cardiac cath February 2021 showed 60-70% left main, 50-60% LAD, 60-70% circumflex.  Patient underwent coronary artery bypass and graft March 2021 with LIMA to the LAD, saphenous vein graft to the diagonal, saphenous vein graft to the ramus intermediate and saphenous vein graft to the right coronary artery.  Patient did develop postoperative atrial fibrillation treated with amiodarone.  Had syncopal episode and seen at [facility] March 2021.  Echocardiogram at that time showed normal LV function, mild left ventricular hypertrophy and small pericardial effusion.  Developed recurrent atrial fibrillation and had cardioversion April 2021.  Also with metastatic renal cell carcinoma.  Since last seen he has dyspnea with more vigorous activities but not routine activities.  No orthopnea, PND, pedal edema, chest pain or syncope.  Current Outpatient Medications  Medication Sig Dispense Refill   AMBULATORY NON FORMULARY MEDICATION Home O2 with humidifier.  2 to 4 L to be used daily, fax to [facility] f: [phone] 1 each 0   apixaban (ELIQUIS) 5 MG TABS tablet TAKE 1 TABLET BY MOUTH TWICE A DAY 180 tablet 3   Azelastine HCl 137 MCG/SPRAY SOLN PLACE 2 SPRAYS INTO BOTH NOSTRILS 2 (TWO) TIMES DAILY AS NEEDED FOR RHINITIS. 30 mL 11   cetirizine (ZYRTEC) 10 MG tablet Take 10 mg by mouth daily.     Continuous Glucose Receiver (FREESTYLE LIBRE 2 READER) DEVI 1 each by Does not apply route in the morning, at noon, in the evening, and at bedtime. 1 each 11   Continuous Glucose Sensor (FREESTYLE LIBRE 2 SENSOR) MISC 1 APPLICATION BY DOES NOT APPLY ROUTE EVERY 14 (FOURTEEN) DAYS. APPLY UPPER DELTOID EVERY 14 DAYS, USE READER TO DETERMINE BLOOD SUGARS 3 each 11   Dulaglutide (TRULICITY) 4.5 MG/0.5ML SOAJ INJECT 4.5 MG SUBCUTANEOUSLY ONCE A WEEK 12 mL 3   gabapentin (NEURONTIN) 300 MG capsule Take 1 capsule (300 mg total) by mouth 3 (three) times daily. 270 capsule 3   glipiZIDE  (GLUCOTROL) 5 MG tablet Take 1 tablet (5 mg total) by mouth 2 (two) times daily before a meal. 180 tablet 3   levothyroxine (SYNTHROID) 50 MCG tablet Take 50 mcg by mouth every morning.     metoprolol tartrate (LOPRESSOR) 25 MG tablet TAKE 1 TABLET BY MOUTH TWICE A DAY 180 tablet 1   nitroGLYCERIN (NITROSTAT) 0.4 MG SL tablet PLACE 1 TABLET UNDER THE TONGUE EVERY 5 MINUTES AS NEEDED FOR CHEST PAIN. 25 tablet 2   ondansetron (ZOFRAN) 4 MG tablet Take 1 tablet (4 mg total) by mouth every 8 (eight) hours as needed for nausea or vomiting. 10 tablet 0   pantoprazole (PROTONIX) 40 MG tablet Take 1 tablet (40 mg total) by mouth daily. 90 tablet 3   polyethylene glycol (MIRALAX / GLYCOLAX) 17 g packet Take 17 g by mouth 2 (two) times daily. Until stooling regularly 30 packet 11   pravastatin (PRAVACHOL) 80 MG tablet TAKE 1 TABLET BY MOUTH EVERY DAY 90 tablet 3   PROAIR RESPICLICK 108 (90 Base) MCG/ACT AEPB INHALE 2 PUFFS INTO THE LUNGS EVERY 4 (FOUR) HOURS AS NEEDED (SHORTNESS OF BREATH). 1 each 11   SENEXON-S 8.6-50 MG tablet TAKE TWO TABLETS BY MOUTH 2 TIMES DAILY. 120 tablet 11   sertraline (ZOLOFT) 50 MG tablet Take 1 tablet (50 mg total) by mouth daily. 90 tablet 3   tamsulosin (FLOMAX) 0.4 MG CAPS capsule Take 1 capsule (0.4 mg total) by mouth daily.     Thiamine HCl (VITAMIN  B-1 PO) Take by mouth.     TRELEGY ELLIPTA 200-62.5-25 MCG/ACT AEPB INHALE 1 PUFF DAILY 60 each 11   triamcinolone (NASACORT) 55 MCG/ACT AERO nasal inhaler PLACE 2 SPRAYS INTO THE NOSE DAILY. (Patient taking differently: Place 2 sprays into the nose daily as needed (congestion).) 1 Inhaler 11   VENTOLIN HFA 108 (90 Base) MCG/ACT inhaler INHALE 2 PUFFS BY MOUTH EVERY 6 HOURS AS NEEDED FOR WHEEZE 18 each 3   No current facility-administered medications for this visit.     Past Medical History:  Diagnosis Date   Arthritis    CAD (coronary artery disease)    07/01/2019: LHC with LM disease. Needs CABG.   Cancer [facility]) 2013    left kidney   Complication of anesthesia    slow to wake up after kidney stone surgery   Constipation, chronic    COPD (chronic obstructive pulmonary disease) (HCC)    Diabetes mellitus (HCC)    Dyspnea    with exertion   Family history of adverse reaction to anesthesia    daughter couldn't talk for 3-4 days after anesthesia    Frequent urination    GERD (gastroesophageal reflux disease)    Headache    Hernia    History of kidney stones    Hyperlipidemia    Hypertension    Peripheral vascular disease (HCC)    right leg blood clot 20 plus years ago   Pneumonia    Renal insufficiency    stage 2 (only 1 kidney) had Kidney cancer   Spondylolysis    Tongue cancer (HCC) 2011    Past Surgical History:  Procedure Laterality Date   CARDIOVERSION N/A 09/02/2019   Procedure: CARDIOVERSION;  Surgeon: Jodelle Red, MD;  Location: [facility] ENDOSCOPY;  Service: Cardiovascular;  Laterality: N/A;   CENTRAL VENOUS CATHETER INSERTION Right 07/12/2019   Procedure: Insertion Central Line Adult;  Surgeon: Kerin Perna, MD;  Location: [facility] OR;  Service: Open Heart Surgery;  Laterality: Right;  Right subclavian    COLONOSCOPY     CORONARY ARTERY BYPASS GRAFT N/A 07/12/2019   Procedure: CORONARY ARTERY BYPASS GRAFTING (CABG), ON PUMP, TIMES FOUR , USING LEFT INTERNAL MAMMARY ARTERY AND ENDOSCOPICALLY HARVESTED BILATERAL GREATER SAPHENOUS VEINS;  Surgeon: Kerin Perna, MD;  Location: [facility] OR;  Service: Open Heart Surgery;  Laterality: N/A;   HERNIA REPAIR     abdominal   KIDNEY STONE SURGERY     LEFT HEART CATH AND CORONARY ANGIOGRAPHY N/A 07/01/2019   Procedure: LEFT HEART CATH AND CORONARY ANGIOGRAPHY;  Surgeon: Lyn Records, MD;  Location: [facility] INVASIVE CV LAB;  Service: Cardiovascular;  Laterality: N/A;   NEPHRECTOMY Left 10/08/2011   s/p robotic assisted lap left radical nephrectomy 10/08/11   PARTIAL GLOSSECTOMY  12/07/2009   s/p partial glossectomy for SCC right tongue 12/07/09   TEE WITHOUT  CARDIOVERSION N/A 07/12/2019   Procedure: TRANSESOPHAGEAL ECHOCARDIOGRAM (TEE);  Surgeon: Donata Clay, Theron Arista, MD;  Location: [facility] OR;  Service: Open Heart Surgery;  Laterality: N/A;    Social History   Socioeconomic History   Marital status: Married    Spouse name: Not on file   Number of children: 2   Years of education: Not on file   Highest education level: Not on file  Occupational History   Not on file  Tobacco Use   Smoking status: Former    Current packs/day: 1.00    Average packs/day: 1 pack/day for 2.0 years (2.0 ttl pk-yrs)    Types: Cigarettes  Smokeless tobacco: Never   Tobacco comments:    smoked 3 years as a teenager  Substance and Sexual Activity   Alcohol use: No   Drug use: No   Sexual activity: Never  Other Topics Concern   Not on file  Social History Narrative   Not on file   Social Drivers of Health   Financial Resource Strain: Low Risk  (07/24/2022)   Received from [facility], [facility]   Overall Financial Resource Strain (CARDIA)    Difficulty of Paying Living Expenses: Not hard at all  Food Insecurity: No Food Insecurity (07/24/2022)   [facility], [facility]   Hunger Vital Sign    Worried About Running Out of Food in the Last Year: Never true    Ran Out of Food in the Last Year: Never true  Transportation Needs: No Transportation Needs (07/24/2022)   Received from [facility], [facility]   PRAPARE - Transportation    Lack of Transportation (Medical): No    Lack of Transportation (Non-Medical): No  Physical Activity: Not on file  Stress: No Stress Concern Present (07/26/2020)   Received from [facility], [facility] of Occupational Health - Occupational Stress Questionnaire    Feeling of Stress : Not at all  Social Connections: Unknown (09/08/2021)   Received from [facility], [facility]   Social Network    Social Network: Not on file  Intimate Partner Violence: Not At Risk (10/16/2022)    Received from [facility], [facility]   HITS    Over the last 12 months how often did your partner physically hurt you?: Never    Over the last 12 months how often did your partner insult you or talk down to you?: Never    Over the last 12 months how often did your partner threaten you with physical harm?: Never    Over the last 12 months how often did your partner scream or curse at you?: Never    Family History  Problem Relation Age of Onset   Cancer Mother        bladder   Heart attack Father     ROS: no fevers or chills, productive cough, hemoptysis, dysphasia, odynophagia, melena, hematochezia, dysuria, hematuria, rash, seizure activity, orthopnea, PND, pedal edema, claudication. Remaining systems are negative.  Physical Exam: Well-developed well-nourished in no acute distress.  Skin is warm and dry.  HEENT is normal.  Neck is supple.  Chest is clear to auscultation with normal expansion.  Cardiovascular exam is regular rate and rhythm.  Abdominal exam nontender or distended. No masses palpated. Extremities show no edema. neuro grossly intact   A/P  1 coronary artery disease status post coronary artery bypass and graft-patient will continue statin.  He is not on aspirin given need for anticoagulation.  2 history of atrial fibrillation-most recent documented episode occurred 1 month after coronary artery bypass and graft.  He remains in sinus rhythm.  I have elected to continue apixaban.  Given that he is 82 and creatinine greater than 1.5 will decrease to 2.5 mg twice daily.  3 hypertension-patient's blood pressure is controlled.  Continue present medications.  4 hyperlipidemia-continue statin.  5 chronic stage III kidney disease-Per nephrology.  6 history of metastatic renal cell carcinoma-Per oncology.  Olga Millers, MD

## 2023-06-11 ENCOUNTER — Encounter: Payer: Self-pay | Admitting: Sports Medicine

## 2023-06-11 ENCOUNTER — Ambulatory Visit: Payer: Medicare Other | Admitting: Sports Medicine

## 2023-06-11 VITALS — BP 124/79 | HR 79

## 2023-06-11 DIAGNOSIS — I10 Essential (primary) hypertension: Secondary | ICD-10-CM

## 2023-06-11 DIAGNOSIS — Z7984 Long term (current) use of oral hypoglycemic drugs: Secondary | ICD-10-CM | POA: Diagnosis not present

## 2023-06-11 DIAGNOSIS — E1121 Type 2 diabetes mellitus with diabetic nephropathy: Secondary | ICD-10-CM

## 2023-06-11 DIAGNOSIS — Z7985 Long-term (current) use of injectable non-insulin antidiabetic drugs: Secondary | ICD-10-CM

## 2023-06-11 LAB — POCT GLYCOSYLATED HEMOGLOBIN (HGB A1C): Hemoglobin A1C: 6.1 % — AB (ref 4.0–5.6)

## 2023-06-11 MED ORDER — GABAPENTIN 300 MG PO CAPS: 300.0000 mg | ORAL_CAPSULE | Freq: Three times a day (TID) | ORAL | 3 refills | Status: AC

## 2023-06-11 NOTE — Progress Notes (Addendum)
    Procedures performed today:    None.  Independent interpretation of notes and tests performed by another provider:   None.  Brief History, Exam, Impression, and Recommendations:    Controlled type 2 diabetes mellitus with diabetic nephropathy (HCC) Currently on Trulicity  4.5, A1c increased to 10.3% at the last visit, we added glipizide , 5 mg twice a day, today he returns with an A1c that has normalized. We avoided metformin  due to renal insufficiency. Continue current medications, return in 6 months for repeat A1c.   Hypertension Acceptably controlled hypertension, he does need to check it at home as he has had some labile readings in the past. I would recommend he use a home blood pressure cuff. This will be medically necessary.    ____________________________________________ Joselyn Nicely. Sandy Crumb, M.D., ABFM., CAQSM., AME. Primary Care and Sports Medicine [facility]  Adjunct Professor of [facility] Medicine  [employer]  Restaurant manager, fast food

## 2023-06-11 NOTE — Assessment & Plan Note (Signed)
Currently on Trulicity 4.5, A1c increased to 10.3% at the last visit, we added glipizide, 5 mg twice a day, today he returns with an A1c that has normalized. We avoided metformin due to renal insufficiency. Continue current medications, return in 6 months for repeat A1c.

## 2023-06-15 ENCOUNTER — Encounter: Payer: Self-pay | Admitting: Cardiology

## 2023-06-15 ENCOUNTER — Ambulatory Visit (INDEPENDENT_AMBULATORY_CARE_PROVIDER_SITE_OTHER): Payer: Medicare Other | Admitting: Cardiology

## 2023-06-15 VITALS — BP 117/74 | HR 77 | Ht 67.0 in | Wt 220.0 lb

## 2023-06-15 DIAGNOSIS — I2510 Atherosclerotic heart disease of native coronary artery without angina pectoris: Secondary | ICD-10-CM | POA: Diagnosis not present

## 2023-06-15 DIAGNOSIS — E7800 Pure hypercholesterolemia, unspecified: Secondary | ICD-10-CM | POA: Diagnosis not present

## 2023-06-15 DIAGNOSIS — I10 Essential (primary) hypertension: Secondary | ICD-10-CM

## 2023-06-15 DIAGNOSIS — I480 Paroxysmal atrial fibrillation: Secondary | ICD-10-CM

## 2023-06-15 MED ORDER — APIXABAN 2.5 MG PO TABS: 2.5000 mg | ORAL_TABLET | Freq: Two times a day (BID) | ORAL | 3 refills | Status: AC

## 2023-06-15 NOTE — Patient Instructions (Signed)
Medication Instructions:   DECREASE ELIQUIS TO 2.5 MG TWICE DAILY= 1/2 OF THE 5 MG TABLET TWICE DAILY  *If you need a refill on your cardiac medications before your next appointment, please call your pharmacy*   Follow-Up: At [facility], you and your health needs are our priority.  As part of our continuing mission to provide you with exceptional heart care, we have created designated Provider Care Teams.  These Care Teams include your primary Cardiologist (physician) and Advanced Practice Providers (APPs -  Physician Assistants and Nurse Practitioners) who all work together to provide you with the care you need, when you need it.  Your next appointment:   12 month(s)  Provider:   Olga Millers, MD

## 2023-07-03 ENCOUNTER — Ambulatory Visit: Payer: Medicare Other | Admitting: Podiatry

## 2023-07-03 DIAGNOSIS — B351 Tinea unguium: Secondary | ICD-10-CM | POA: Diagnosis not present

## 2023-07-03 DIAGNOSIS — M79675 Pain in left toe(s): Secondary | ICD-10-CM

## 2023-07-03 DIAGNOSIS — M79674 Pain in right toe(s): Secondary | ICD-10-CM | POA: Diagnosis not present

## 2023-07-03 DIAGNOSIS — E1121 Type 2 diabetes mellitus with diabetic nephropathy: Secondary | ICD-10-CM

## 2023-07-03 NOTE — Progress Notes (Signed)
  Subjective:  Patient ID: Clinton Ortiz, male    DOB: 09/06/1940,   MRN: [id number]  Chief Complaint  Patient presents with   Nail Problem    RFC    83 y.o. male presents for concern of thickened elongated and painful nails that are difficult to trim. Requesting to have them trimmed today. Relates burning and tingling in their feet. Patient is diabetic and last A1c was  Lab Results  Component Value Date   HGBA1C 6.1 (A) 06/11/2023   .   PCP:  Monica Becton, MD    . Denies any other pedal complaints. Denies n/v/f/c.   Past Medical History:  Diagnosis Date   Arthritis    CAD (coronary artery disease)    07/01/2019: LHC with LM disease. Needs CABG.   Cancer [facility]) 2013   left kidney   Complication of anesthesia    slow to wake up after kidney stone surgery   Constipation, chronic    COPD (chronic obstructive pulmonary disease) (HCC)    Diabetes mellitus (HCC)    Dyspnea    with exertion   Family history of adverse reaction to anesthesia    daughter couldn't talk for 3-4 days after anesthesia    Frequent urination    GERD (gastroesophageal reflux disease)    Headache    Hernia    History of kidney stones    Hyperlipidemia    Hypertension    Peripheral vascular disease (HCC)    right leg blood clot 20 plus years ago   Pneumonia    Renal insufficiency    stage 2 (only 1 kidney) had Kidney cancer   Spondylolysis    Tongue cancer (HCC) 2011    Objective:  Physical Exam: .Vascular: DP/PT pulses 2/4 bilateral. CFT <3 seconds. Absent hair growth on digits. Edema noted to bilateral lower extremities. Xerosis noted bilaterally.  Skin. No lacerations or abrasions bilateral feet. Nails 1-5 bilateral  are thickened discolored and elongated with subungual debris.  Musculoskeletal: MMT 5/5 bilateral lower extremities in DF, PF, Inversion and Eversion. Deceased ROM in DF of ankle joint.  Neurological: Sensation intact to light touch. Protective sensation diminished bilateral.     Assessment:   1. Pain due to onychomycosis of toenails of both feet   2. Controlled type 2 diabetes mellitus with diabetic nephropathy, unspecified whether long term insulin use (HCC)      Plan:  Patient was evaluated and treated and all questions answered. -Discussed and educated patient on diabetic foot care, especially with  regards to the vascular, neurological and musculoskeletal systems.  -Stressed the importance of good glycemic control and the detriment of not  controlling glucose levels in relation to the foot. -Discussed supportive shoes at all times and checking feet regularly.  -Mechanically debrided all nails 1-5 bilateral using sterile nail nipper and filed with dremel without incident  -Answered all patient questions -Patient to return  in 3 months for at risk foot care -Patient advised to call the office if any problems or questions arise in the meantime.   Louann Sjogren, DPM

## 2023-08-19 ENCOUNTER — Other Ambulatory Visit (HOSPITAL_COMMUNITY): Payer: Self-pay

## 2023-08-24 ENCOUNTER — Telehealth: Payer: Self-pay

## 2023-08-25 NOTE — Telephone Encounter (Signed)
error 

## 2023-08-31 ENCOUNTER — Other Ambulatory Visit: Payer: Self-pay | Admitting: Sports Medicine

## 2023-08-31 DIAGNOSIS — I10 Essential (primary) hypertension: Secondary | ICD-10-CM

## 2023-09-10 NOTE — Assessment & Plan Note (Signed)
 Acceptably controlled hypertension, he does need to check it at home as he has had some labile readings in the past. I would recommend he use a home blood pressure cuff. This will be medically necessary.

## 2023-09-30 ENCOUNTER — Telehealth: Payer: Self-pay | Admitting: Sports Medicine

## 2023-09-30 NOTE — Telephone Encounter (Unsigned)
 Copied from CRM [id number]. Topic: Clinical - Medication Refill >> Sep 30, 2023 12:44 PM Shamecia H wrote: Medication: cyclobenzaprine  (FLEXERIL ) 10 MG tablet  Has the patient contacted their pharmacy? Yes (Agent: If no, request that the patient contact the pharmacy for the refill. If patient does not wish to contact the pharmacy document the reason why and proceed with request.) (Agent: If yes, when and what did the pharmacy advise?)  This is the patient's preferred pharmacy:  [facility] [phone] - [place],  - [address] [place] [place] [address] Phone: [phone] Fax: [phone]  Is this the correct pharmacy for this prescription? Yes If no, delete pharmacy and type the correct one.   Has the prescription been filled recently? No  Is the patient out of the medication? Yes  Has the patient been seen for an appointment in the last year OR does the patient have an upcoming appointment? Yes  Can we respond through MyChart? Yes  Agent: Please be advised that Rx refills may take up to 3 business days. We ask that you follow-up with your pharmacy.

## 2023-10-08 ENCOUNTER — Ambulatory Visit: Payer: Medicare Other | Admitting: Podiatry

## 2023-10-15 ENCOUNTER — Ambulatory Visit (INDEPENDENT_AMBULATORY_CARE_PROVIDER_SITE_OTHER): Payer: Medicare Other | Admitting: Podiatry

## 2023-10-15 ENCOUNTER — Encounter: Payer: Self-pay | Admitting: Podiatry

## 2023-10-15 DIAGNOSIS — B351 Tinea unguium: Secondary | ICD-10-CM | POA: Diagnosis not present

## 2023-10-15 DIAGNOSIS — M79674 Pain in right toe(s): Secondary | ICD-10-CM | POA: Diagnosis not present

## 2023-10-15 DIAGNOSIS — M79675 Pain in left toe(s): Secondary | ICD-10-CM

## 2023-10-15 DIAGNOSIS — E1121 Type 2 diabetes mellitus with diabetic nephropathy: Secondary | ICD-10-CM

## 2023-10-15 NOTE — Progress Notes (Signed)
  Subjective:  Patient ID: Clinton Ortiz, male    DOB: 15-Apr-1941,   MRN: [id number]  Chief Complaint  Patient presents with   Diabetes    83 y.o. male presents for concern of thickened elongated and painful nails that are difficult to trim. Requesting to have them trimmed today. Relates burning and tingling in their feet. Patient is diabetic and last A1c was  Lab Results  Component Value Date   HGBA1C 6.1 (A) 06/11/2023   .   PCP:  Gean Keels, MD    . Denies any other pedal complaints. Denies n/v/f/c.   Past Medical History:  Diagnosis Date   Arthritis    CAD (coronary artery disease)    07/01/2019: LHC with LM disease. Needs CABG.   Cancer [facility]) 2013   left kidney   Complication of anesthesia    slow to wake up after kidney stone surgery   Constipation, chronic    COPD (chronic obstructive pulmonary disease) (HCC)    Diabetes mellitus (HCC)    Dyspnea    with exertion   Family history of adverse reaction to anesthesia    daughter couldn't talk for 3-4 days after anesthesia    Frequent urination    GERD (gastroesophageal reflux disease)    Headache    Hernia    History of kidney stones    Hyperlipidemia    Hypertension    Peripheral vascular disease (HCC)    right leg blood clot 20 plus years ago   Pneumonia    Renal insufficiency    stage 2 (only 1 kidney) had Kidney cancer   Spondylolysis    Tongue cancer (HCC) 2011    Objective:  Physical Exam: .Vascular: DP/PT pulses 2/4 bilateral. CFT <3 seconds. Absent hair growth on digits. Edema noted to bilateral lower extremities. Xerosis noted bilaterally.  Skin. No lacerations or abrasions bilateral feet. Nails 1-5 bilateral  are thickened discolored and elongated with subungual debris.  Musculoskeletal: MMT 5/5 bilateral lower extremities in DF, PF, Inversion and Eversion. Deceased ROM in DF of ankle joint.  Neurological: Sensation intact to light touch. Protective sensation diminished bilateral.     Assessment:   1. Pain due to onychomycosis of toenails of both feet   2. Controlled type 2 diabetes mellitus with diabetic nephropathy, unspecified whether long term insulin  use (HCC)      Plan:  Patient was evaluated and treated and all questions answered. -Discussed and educated patient on diabetic foot care, especially with  regards to the vascular, neurological and musculoskeletal systems.  -Stressed the importance of good glycemic control and the detriment of not  controlling glucose levels in relation to the foot. -Discussed supportive shoes at all times and checking feet regularly.  -Mechanically debrided all nails 1-5 bilateral using sterile nail nipper and filed with dremel without incident  -Answered all patient questions -Patient to return  in 3 months for at risk foot care -Patient advised to call the office if any problems or questions arise in the meantime.   Jennefer Moats, DPM

## 2023-12-01 ENCOUNTER — Other Ambulatory Visit: Payer: Self-pay | Admitting: Sports Medicine

## 2023-12-01 DIAGNOSIS — E1121 Type 2 diabetes mellitus with diabetic nephropathy: Secondary | ICD-10-CM

## 2023-12-04 ENCOUNTER — Other Ambulatory Visit: Payer: Self-pay | Admitting: Sports Medicine

## 2023-12-04 DIAGNOSIS — K219 Gastro-esophageal reflux disease without esophagitis: Secondary | ICD-10-CM

## 2023-12-04 DIAGNOSIS — N400 Enlarged prostate without lower urinary tract symptoms: Secondary | ICD-10-CM

## 2023-12-10 ENCOUNTER — Ambulatory Visit: Payer: Medicare Other | Admitting: Sports Medicine

## 2023-12-10 VITALS — BP 111/68 | HR 79 | Ht 67.0 in | Wt 238.0 lb

## 2023-12-10 DIAGNOSIS — Z7984 Long term (current) use of oral hypoglycemic drugs: Secondary | ICD-10-CM

## 2023-12-10 DIAGNOSIS — M47816 Spondylosis without myelopathy or radiculopathy, lumbar region: Secondary | ICD-10-CM

## 2023-12-10 DIAGNOSIS — E1121 Type 2 diabetes mellitus with diabetic nephropathy: Secondary | ICD-10-CM | POA: Diagnosis not present

## 2023-12-10 LAB — POCT GLYCOSYLATED HEMOGLOBIN (HGB A1C): HbA1c, POC (controlled diabetic range): 6.1 % (ref 0.0–7.0)

## 2023-12-10 MED ORDER — METHOCARBAMOL 500 MG PO TABS: 500.0000 mg | ORAL_TABLET | Freq: Three times a day (TID) | ORAL | 0 refills | Status: AC

## 2023-12-10 MED ORDER — HYDROCODONE-ACETAMINOPHEN 5-325 MG PO TABS: 1.0000 | ORAL_TABLET | Freq: Three times a day (TID) | ORAL | 0 refills | Status: AC | PRN

## 2023-12-10 NOTE — Assessment & Plan Note (Signed)
 Clinton Ortiz also has chronic low back pain, initially did not respond to lumbar epidurals. The neck step for him was going to be L3-S1 facet joint injections, ultimately he became well-controlled on hydrocodone , happy to refill this, his daughter is wondering if a muscle relaxer would be a better option, I will send in some methocarbamol  and he can alternate to see which one works better.

## 2023-12-10 NOTE — Progress Notes (Signed)
    Procedures performed today:    None.  Independent interpretation of notes and tests performed by another provider:   None.  Brief History, Exam, Impression, and Recommendations:    Controlled type 2 diabetes mellitus with diabetic nephropathy (HCC) A1c is now beautifully controlled 6.1% on Trulicity  4.5 mg, glipizide  5 mg twice a day. We avoided metformin  due to renal insufficiency. Return in 6 months, we can do a Medicare wellness exam at the same time.  Lumbar spondylosis Clinton Ortiz also has chronic low back pain, initially did not respond to lumbar epidurals. The neck step for him was going to be L3-S1 facet joint injections, ultimately he became well-controlled on hydrocodone , happy to refill this, his daughter is wondering if a muscle relaxer would be a better option, I will send in some methocarbamol  and he can alternate to see which one works better.    ____________________________________________ Debby PARAS. Curtis, M.D., ABFM., CAQSM., AME. Primary Care and Sports Medicine [facility]  Adjunct Professor of [facility] Medicine  [employer]  Restaurant manager, fast food

## 2023-12-10 NOTE — Assessment & Plan Note (Signed)
 A1c is now beautifully controlled 6.1% on Trulicity  4.5 mg, glipizide  5 mg twice a day. We avoided metformin  due to renal insufficiency. Return in 6 months, we can do a Medicare wellness exam at the same time.

## 2023-12-27 ENCOUNTER — Other Ambulatory Visit: Payer: Self-pay | Admitting: Sports Medicine

## 2023-12-27 DIAGNOSIS — E1121 Type 2 diabetes mellitus with diabetic nephropathy: Secondary | ICD-10-CM

## 2024-01-12 ENCOUNTER — Encounter: Payer: Self-pay | Admitting: Sports Medicine

## 2024-01-15 ENCOUNTER — Ambulatory Visit: Admitting: Podiatry

## 2024-01-15 ENCOUNTER — Encounter: Payer: Self-pay | Admitting: Podiatry

## 2024-01-15 DIAGNOSIS — E1121 Type 2 diabetes mellitus with diabetic nephropathy: Secondary | ICD-10-CM

## 2024-01-15 DIAGNOSIS — M79674 Pain in right toe(s): Secondary | ICD-10-CM | POA: Diagnosis not present

## 2024-01-15 DIAGNOSIS — B351 Tinea unguium: Secondary | ICD-10-CM | POA: Diagnosis not present

## 2024-01-15 DIAGNOSIS — M79675 Pain in left toe(s): Secondary | ICD-10-CM | POA: Diagnosis not present

## 2024-01-15 NOTE — Progress Notes (Signed)
  Subjective:  Patient ID: Clinton Ortiz, male    DOB: 08-09-1940,   MRN: [id number]  Chief Complaint  Patient presents with   Diabetes    Cut the toenails.  Saw Dr. Curtis -     83 y.o. male presents for concern of thickened elongated and painful nails that are difficult to trim. Requesting to have them trimmed today. Relates burning and tingling in their feet. Patient is diabetic and last A1c was  Lab Results  Component Value Date   HGBA1C 6.1 12/10/2023   .   PCP:  No primary care provider on file.    . Denies any other pedal complaints. Denies n/v/f/c.   Past Medical History:  Diagnosis Date   Arthritis    CAD (coronary artery disease)    07/01/2019: LHC with LM disease. Needs CABG.   Cancer [facility]) 2013   left kidney   Complication of anesthesia    slow to wake up after kidney stone surgery   Constipation, chronic    COPD (chronic obstructive pulmonary disease) (HCC)    Diabetes mellitus (HCC)    Dyspnea    with exertion   Family history of adverse reaction to anesthesia    daughter couldn't talk for 3-4 days after anesthesia    Frequent urination    GERD (gastroesophageal reflux disease)    Headache    Hernia    History of kidney stones    Hyperlipidemia    Hypertension    Peripheral vascular disease (HCC)    right leg blood clot 20 plus years ago   Pneumonia    Renal insufficiency    stage 2 (only 1 kidney) had Kidney cancer   Spondylolysis    Tongue cancer (HCC) 2011    Objective:  Physical Exam: .Vascular: DP/PT pulses 2/4 bilateral. CFT <3 seconds. Absent hair growth on digits. Edema noted to bilateral lower extremities. Xerosis noted bilaterally.  Skin. No lacerations or abrasions bilateral feet. Nails 1-5 bilateral  are thickened discolored and elongated with subungual debris.  Musculoskeletal: MMT 5/5 bilateral lower extremities in DF, PF, Inversion and Eversion. Deceased ROM in DF of ankle joint.  Neurological: Sensation intact to light touch.  Protective sensation diminished bilateral.    Assessment:   1. Pain due to onychomycosis of toenails of both feet   2. Controlled type 2 diabetes mellitus with diabetic nephropathy, unspecified whether long term insulin  use (HCC)      Plan:  Patient was evaluated and treated and all questions answered. -Discussed and educated patient on diabetic foot care, especially with  regards to the vascular, neurological and musculoskeletal systems.  -Stressed the importance of good glycemic control and the detriment of not  controlling glucose levels in relation to the foot. -Discussed supportive shoes at all times and checking feet regularly.  -Mechanically debrided all nails 1-5 bilateral using sterile nail nipper and filed with dremel without incident  -Answered all patient questions -Patient to return  in 3 months for at risk foot care -Patient advised to call the office if any problems or questions arise in the meantime.   Asberry Failing, DPM

## 2024-01-20 ENCOUNTER — Other Ambulatory Visit: Payer: Self-pay

## 2024-01-20 MED ORDER — ALBUTEROL SULFATE HFA 108 (90 BASE) MCG/ACT IN AERS: 2.0000 | INHALATION_SPRAY | Freq: Four times a day (QID) | RESPIRATORY_TRACT | 0 refills | Status: AC | PRN

## 2024-03-10 ENCOUNTER — Telehealth: Payer: Self-pay | Admitting: Sports Medicine

## 2024-03-10 ENCOUNTER — Other Ambulatory Visit (HOSPITAL_BASED_OUTPATIENT_CLINIC_OR_DEPARTMENT_OTHER): Payer: Self-pay

## 2024-03-10 DIAGNOSIS — I10 Essential (primary) hypertension: Secondary | ICD-10-CM

## 2024-03-10 MED ORDER — METOPROLOL TARTRATE 25 MG PO TABS: 25.0000 mg | ORAL_TABLET | Freq: Two times a day (BID) | ORAL | 0 refills | Status: AC

## 2024-03-10 NOTE — Telephone Encounter (Signed)
 Patient's daughter requested that her father see you as his new pcp he is currently in the hospital and also wanted to do a hospital follow up with you when he gets out

## 2024-03-10 NOTE — Telephone Encounter (Signed)
 Attempted to contact patient in regards to medication refill. Unfortunately, I was sent to voicemail. Per DPR, I was able to leave a detailed voice message to the number [phone]. In message I stated I will be providing pt with a 90-day supply for Metoprolol  Tartrate 25 mg. Furthermore, I also stated patient needed to establish with a new PCP for further refills, he could contact our office for further assistance if needed.

## 2024-03-10 NOTE — Telephone Encounter (Signed)
 Patient's daughter stated patient is in hospital and ask if you can see him as a toc  Please advise   [phone]

## 2024-03-11 NOTE — Telephone Encounter (Signed)
 Lets go ahead and get him in with me for hospital follow-up next week if possible.  As far as taking new patients I have to understand that I am not available on Fridays and most of Wednesday and that may limit their access to me and being able to get in quickly.  So as long as they are okay with that

## 2024-03-15 ENCOUNTER — Encounter: Payer: Self-pay | Admitting: Urgent Care

## 2024-03-15 ENCOUNTER — Ambulatory Visit (INDEPENDENT_AMBULATORY_CARE_PROVIDER_SITE_OTHER): Admitting: Urgent Care

## 2024-03-15 VITALS — BP 112/70 | HR 98 | Ht 67.0 in | Wt 234.0 lb

## 2024-03-15 DIAGNOSIS — J90 Pleural effusion, not elsewhere classified: Secondary | ICD-10-CM | POA: Diagnosis not present

## 2024-03-15 DIAGNOSIS — N184 Chronic kidney disease, stage 4 (severe): Secondary | ICD-10-CM

## 2024-03-15 DIAGNOSIS — Z23 Encounter for immunization: Secondary | ICD-10-CM

## 2024-03-15 DIAGNOSIS — R0989 Other specified symptoms and signs involving the circulatory and respiratory systems: Secondary | ICD-10-CM | POA: Diagnosis not present

## 2024-03-15 DIAGNOSIS — Z09 Encounter for follow-up examination after completed treatment for conditions other than malignant neoplasm: Secondary | ICD-10-CM

## 2024-03-15 DIAGNOSIS — W19XXXD Unspecified fall, subsequent encounter: Secondary | ICD-10-CM

## 2024-03-15 DIAGNOSIS — E039 Hypothyroidism, unspecified: Secondary | ICD-10-CM

## 2024-03-15 DIAGNOSIS — M4810 Ankylosing hyperostosis [Forestier], site unspecified: Secondary | ICD-10-CM | POA: Insufficient documentation

## 2024-03-15 NOTE — Progress Notes (Signed)
 Established Patient Office Visit  Subjective:  Patient ID: Clinton Ortiz, male    DOB: [birth year]  Age: 83 y.o. MRN: [id number]  Chief Complaint  Patient presents with   Hospitalization Follow-up    A. Flutter and vomiting    HPI  Discussed the use of AI scribe software for clinical note transcription with the patient, who gave verbal consent to proceed.  History of Present Illness   Clinton Ortiz is an 83 year old male with atrial fibrillation who presents with recent episodes of vomiting and syncope.  He experienced an episode of vomiting and syncope on Thursday while at [place], leading to a fall. EMS was called, and he was taken to the hospital where he was found to be in atrial flutter. He was treated with magnesium  and fluids, which initially reduced his heart rate, but it later increased again. He did not stay overnight at the hospital. A comprehensive CT scan of his head, neck, abdomen, and chest was performed during his hospital visit. He has not experienced any further vomiting since the incident and feels well today.  He has a history of chronic cough and shortness of breath for over five years, with no significant changes recently. He uses a rescue inhaler and a daily inhaler, but these have not significantly alleviated his symptoms. He denies recent symptoms such as sneezing, fever, or sinus pain.  He has a history of atrial fibrillation and is on Eliquis . He previously underwent a cardioversion following a quadruple bypass surgery. No recent chest pain, although he experienced chest pain and left arm pain about four to five weeks ago, which resolved with nitroglycerin .  He reports a history of weakness and falls, with two falls occurring at home the day before the [employer] incident. He describes his legs as feeling 'really weak' during these episodes.  He is currently taking oral thiamine (B1) and Synthroid (levothyroxine) in the mornings. There is some uncertainty about the current  prescription status of his thyroid  medication, as the last recorded prescription was three years ago.       Patient Active Problem List   Diagnosis Date Noted   DISH (diffuse idiopathic skeletal hyperostosis) 03/15/2024   CKD stage G4/A1, GFR 15-29 and albumin  creatinine ratio <30 mg/g (HCC) 10/08/2022   Cellulitis of back 08/21/2022   Chronic idiopathic constipation 05/21/2022   Primary osteoarthritis of left hip 07/02/2021   Head trauma 01/29/2021   Obstructive sleep apnea 11/28/2020   Bilateral hearing loss due to cerumen impaction 11/28/2020   Acquired elevated hemidiaphragm 09/02/2019   Secondary hypercoagulable state 08/29/2019   Atrial fibrillation (HCC) 08/24/2019   Insomnia secondary to situational depression 08/19/2019   S/P CABG x 4 07/12/2019   Left main coronary artery disease 07/01/2019   Right foot injury 06/15/2019   Subclinical hypothyroidism 10/20/2018   Plantar fasciitis, bilateral 05/07/2018   Sepsis due to urinary tract infection (HCC) 06/22/2017   CKD stage 3 due to type 2 diabetes mellitus and post unilateral nephrectomy for renal cell carcinoma 06/05/2017   LPRD (laryngopharyngeal reflux disease) 06/04/2017   Mass of right side of neck 09/13/2014   COPD (chronic obstructive pulmonary disease) (HCC) 06/01/2014   Controlled type 2 diabetes mellitus with diabetic nephropathy (HCC) 12/30/2013   Benign prostatic hyperplasia 12/28/2013   Morbid obesity (HCC) 12/28/2013   Annual physical exam 02/14/2013   Hypertension 09/02/2012   Hyperlipidemia 09/02/2012   History of renal cancer status post left nephrectomy 09/02/2012   Swelling, scrotum 09/02/2012   Lumbar spondylosis 12/29/2011  Past Medical History:  Diagnosis Date   Arthritis    CAD (coronary artery disease)    07/01/2019: LHC with LM disease. Needs CABG.   Cancer [facility]) 2013   left kidney   Complication of anesthesia    slow to wake up after kidney stone surgery   Constipation, chronic    COPD  (chronic obstructive pulmonary disease) (HCC)    Diabetes mellitus (HCC)    Dyspnea    with exertion   Family history of adverse reaction to anesthesia    daughter couldn't talk for 3-4 days after anesthesia    Frequent urination    GERD (gastroesophageal reflux disease)    Headache    Hernia    History of kidney stones    Hyperlipidemia    Hypertension    Peripheral vascular disease    right leg blood clot 20 plus years ago   Pneumonia    Renal insufficiency    stage 2 (only 1 kidney) had Kidney cancer   Spondylolysis    Tongue cancer (HCC) 2011   Past Surgical History:  Procedure Laterality Date   CARDIOVERSION N/A 09/02/2019   Procedure: CARDIOVERSION;  Surgeon: Lonni Slain, MD;  Location: [facility] ENDOSCOPY;  Service: Cardiovascular;  Laterality: N/A;   CENTRAL VENOUS CATHETER INSERTION Right 07/12/2019   Procedure: Insertion Central Line Adult;  Surgeon: Fleeta Hanford Coy, MD;  Location: [facility] OR;  Service: Open Heart Surgery;  Laterality: Right;  Right subclavian    COLONOSCOPY     CORONARY ARTERY BYPASS GRAFT N/A 07/12/2019   Procedure: CORONARY ARTERY BYPASS GRAFTING (CABG), ON PUMP, TIMES FOUR , USING LEFT INTERNAL MAMMARY ARTERY AND ENDOSCOPICALLY HARVESTED BILATERAL GREATER SAPHENOUS VEINS;  Surgeon: Fleeta Hanford Coy, MD;  Location: [facility] OR;  Service: Open Heart Surgery;  Laterality: N/A;   HERNIA REPAIR     abdominal   KIDNEY STONE SURGERY     LEFT HEART CATH AND CORONARY ANGIOGRAPHY N/A 07/01/2019   Procedure: LEFT HEART CATH AND CORONARY ANGIOGRAPHY;  Surgeon: Claudene Victory ORN, MD;  Location: [facility] INVASIVE CV LAB;  Service: Cardiovascular;  Laterality: N/A;   NEPHRECTOMY Left 10/08/2011   s/p robotic assisted lap left radical nephrectomy 10/08/11   PARTIAL GLOSSECTOMY  12/07/2009   s/p partial glossectomy for SCC right tongue 12/07/09   TEE WITHOUT CARDIOVERSION N/A 07/12/2019   Procedure: TRANSESOPHAGEAL ECHOCARDIOGRAM (TEE);  Surgeon: Fleeta Hanford, Coy, MD;  Location: [facility] OR;   Service: Open Heart Surgery;  Laterality: N/A;   Social History   Tobacco Use   Smoking status: Former    Current packs/day: 1.00    Average packs/day: 1 pack/day for 2.0 years (2.0 ttl pk-yrs)    Types: Cigarettes   Smokeless tobacco: Never   Tobacco comments:    smoked 3 years as a teenager  Substance Use Topics   Alcohol use: No   Drug use: No      ROS: as noted in HPI  Objective:     BP 112/70   Pulse 98   Ht 5' 7 (1.702 m)   Wt 234 lb (106.1 kg)   SpO2 94%   BMI 36.65 kg/m  BP Readings from Last 3 Encounters:  03/15/24 112/70  12/10/23 111/68  06/15/23 117/74   Wt Readings from Last 3 Encounters:  03/15/24 234 lb (106.1 kg)  12/10/23 238 lb (108 kg)  06/15/23 220 lb (99.8 kg)      Physical Exam Vitals and nursing note reviewed. Exam conducted with a chaperone present.  Constitutional:  General: He is not in acute distress.    Appearance: Normal appearance. He is not ill-appearing, toxic-appearing or diaphoretic.  HENT:     Head: Normocephalic and atraumatic.     Right Ear: External ear normal.     Left Ear: External ear normal.     Nose: Nose normal. No congestion.     Mouth/Throat:     Mouth: Mucous membranes are moist.     Pharynx: No oropharyngeal exudate or posterior oropharyngeal erythema.  Eyes:     General: No scleral icterus.       Right eye: No discharge.        Left eye: No discharge.     Pupils: Pupils are equal, round, and reactive to light.  Cardiovascular:     Rate and Rhythm: Normal rate and regular rhythm.  Pulmonary:     Effort: Pulmonary effort is normal. No respiratory distress.  Abdominal:     Palpations: Abdomen is soft.     Tenderness: There is no guarding or rebound.  Musculoskeletal:     Cervical back: Normal range of motion and neck supple. No tenderness.     Right lower leg: No edema.     Left lower leg: No edema.  Lymphadenopathy:     Cervical: No cervical adenopathy.  Skin:    General: Skin is warm and  dry.     Findings: No erythema or rash.  Neurological:     General: No focal deficit present.     Mental Status: He is alert and oriented to person, place, and time.      No results found for any visits on 03/15/24.  Last CBC Lab Results  Component Value Date   WBC 8.9 10/07/2022   HGB 13.5 10/07/2022   HCT 41.0 10/07/2022   MCV 92.6 10/07/2022   MCH 30.5 10/07/2022   RDW 12.4 10/07/2022   PLT 214 10/07/2022   Last metabolic panel Lab Results  Component Value Date   GLUCOSE 134 (H) 10/07/2022   NA 137 10/07/2022   K 4.3 10/07/2022   CL 103 10/07/2022   CO2 21 10/07/2022   BUN 28 (H) 10/07/2022   CREATININE 2.37 (H) 10/07/2022   EGFR 12 05/11/2023   CALCIUM  9.5 10/07/2022   PHOS 3.4 07/14/2019   PROT 6.6 10/07/2022   ALBUMIN  2.6 (L) 07/19/2019   BILITOT 0.4 10/07/2022   ALKPHOS 63 07/19/2019   AST 17 10/07/2022   ALT 15 10/07/2022   ANIONGAP 15 08/29/2019   Last lipids Lab Results  Component Value Date   CHOL 163 05/01/2022   HDL 41 05/01/2022   LDLCALC 83 05/01/2022   TRIG 324 (H) 05/01/2022   CHOLHDL 4.0 05/01/2022   Last hemoglobin A1c Lab Results  Component Value Date   HGBA1C 6.1 12/10/2023   Last thyroid  functions Lab Results  Component Value Date   TSH 2.73 05/01/2022   FREET4 1.18 (H) 09/09/2019   Last vitamin D No results found for: 25OHVITD2, 25OHVITD3, VD25OH Last vitamin B12 and Folate No results found for: VITAMINB12, FOLATE    The ASCVD Risk score (Arnett DK, et al., 2019) failed to calculate for the following reasons:   The 2019 ASCVD risk score is only valid for ages 73 to 43  Assessment & Plan:  Hospital discharge follow-up  Fall, subsequent encounter  Pleural effusion on left  Pulmonary vascular congestion  DISH (diffuse idiopathic skeletal hyperostosis)  CKD stage G4/A1, GFR 15-29 and albumin  creatinine ratio <30 mg/g (HCC)  Immunization due -  Flu vaccine HIGH DOSE PF(Fluzone  Trivalent)  Hypothyroidism, unspecified type -     TSH + free T4  Assessment and Plan    Atrial flutter, new onset New onset atrial flutter with fluctuating heart rate. Possible viral etiology considered. Thyroid  function not assessed in ER. - Ordered thyroid  function tests. - in NSR in office  Chronic kidney disease, stage 4 Well-managed with no acute changes in recent labs.  Chronic cough and dyspnea with pulmonary congestion Chronic cough and dyspnea with pulmonary congestion noted on chest x-ray. Current inhaler regimen ineffective.  Hypothyroidism, on levothyroxine Managed with levothyroxine. Last prescription three years ago, raising concern about current thyroid  function. - Ordered thyroid  function tests.  History of falls Recent falls possibly related to weakness and vomiting. No acute injuries.  General Health Maintenance Flu shot administered. Declined COVID-19 vaccine due to concerns about side effects. - Administered flu shot. - Discontinued COVID-19 vaccine due to patient's preference.         Return in about 2 months (around 05/15/2024).   Benton LITTIE Gave, PA

## 2024-03-15 NOTE — Patient Instructions (Signed)
Schedule follow-up in 2 months.

## 2024-03-16 ENCOUNTER — Ambulatory Visit: Payer: Self-pay | Admitting: Urgent Care

## 2024-03-16 LAB — TSH+FREE T4
Free T4: 1.09 ng/dL (ref 0.82–1.77)
TSH: 3.61 u[IU]/mL (ref 0.450–4.500)

## 2024-03-31 ENCOUNTER — Other Ambulatory Visit: Payer: Self-pay

## 2024-03-31 DIAGNOSIS — F4321 Adjustment disorder with depressed mood: Secondary | ICD-10-CM

## 2024-03-31 MED ORDER — SERTRALINE HCL 50 MG PO TABS: 50.0000 mg | ORAL_TABLET | Freq: Every day | ORAL | 1 refills | Status: AC

## 2024-04-15 ENCOUNTER — Ambulatory Visit: Admitting: Podiatry

## 2024-05-09 ENCOUNTER — Other Ambulatory Visit: Payer: Self-pay | Admitting: Urgent Care

## 2024-05-09 DIAGNOSIS — E1121 Type 2 diabetes mellitus with diabetic nephropathy: Secondary | ICD-10-CM

## 2024-05-09 MED ORDER — TRULICITY 4.5 MG/0.5ML ~~LOC~~ SOAJ: 4.5000 mg | SUBCUTANEOUS | 1 refills | Status: AC

## 2024-05-09 NOTE — Telephone Encounter (Signed)
 Copied from CRM [id number]. Topic: Clinical - Medication Refill >> May 09, 2024 10:19 AM Rosaria A wrote: Medication: Dulaglutide  (TRULICITY ) 4.5 MG/0.5ML SOAJ   Has the patient contacted their pharmacy? Yes Patient is out of refills.   This is the patient's preferred pharmacy:  [facility] [address] [place] - [address] [place] [address] Phone: [phone] Fax: [phone]      Is this the correct pharmacy for this prescription? Yes If no, delete pharmacy and type the correct one.   Has the prescription been filled recently? No  Is the patient out of the medication? Yes. Patient took his last shot last week and is due for his shot today.   Has the patient been seen for an appointment in the last year OR does the patient have an upcoming appointment? Yes  Can we respond through MyChart? No  Agent: Please be advised that Rx refills may take up to 3 business days. We ask that you follow-up with your pharmacy.

## 2024-05-16 ENCOUNTER — Ambulatory Visit (INDEPENDENT_AMBULATORY_CARE_PROVIDER_SITE_OTHER): Admitting: Podiatry

## 2024-05-16 ENCOUNTER — Ambulatory Visit: Admitting: Urgent Care

## 2024-05-16 ENCOUNTER — Encounter: Payer: Self-pay | Admitting: Podiatry

## 2024-05-16 VITALS — BP 116/79 | HR 95 | Ht 67.0 in | Wt 235.0 lb

## 2024-05-16 DIAGNOSIS — M79675 Pain in left toe(s): Secondary | ICD-10-CM

## 2024-05-16 DIAGNOSIS — M79674 Pain in right toe(s): Secondary | ICD-10-CM | POA: Diagnosis not present

## 2024-05-16 DIAGNOSIS — N184 Chronic kidney disease, stage 4 (severe): Secondary | ICD-10-CM

## 2024-05-16 DIAGNOSIS — E039 Hypothyroidism, unspecified: Secondary | ICD-10-CM | POA: Diagnosis not present

## 2024-05-16 DIAGNOSIS — Z7985 Long-term (current) use of injectable non-insulin antidiabetic drugs: Secondary | ICD-10-CM | POA: Diagnosis not present

## 2024-05-16 DIAGNOSIS — J449 Chronic obstructive pulmonary disease, unspecified: Secondary | ICD-10-CM | POA: Diagnosis not present

## 2024-05-16 DIAGNOSIS — G4733 Obstructive sleep apnea (adult) (pediatric): Secondary | ICD-10-CM

## 2024-05-16 DIAGNOSIS — E1121 Type 2 diabetes mellitus with diabetic nephropathy: Secondary | ICD-10-CM | POA: Diagnosis not present

## 2024-05-16 DIAGNOSIS — W19XXXD Unspecified fall, subsequent encounter: Secondary | ICD-10-CM

## 2024-05-16 DIAGNOSIS — E1142 Type 2 diabetes mellitus with diabetic polyneuropathy: Secondary | ICD-10-CM

## 2024-05-16 DIAGNOSIS — E119 Type 2 diabetes mellitus without complications: Secondary | ICD-10-CM | POA: Diagnosis not present

## 2024-05-16 DIAGNOSIS — C642 Malignant neoplasm of left kidney, except renal pelvis: Secondary | ICD-10-CM

## 2024-05-16 DIAGNOSIS — B351 Tinea unguium: Secondary | ICD-10-CM | POA: Diagnosis not present

## 2024-05-16 DIAGNOSIS — Z0189 Encounter for other specified special examinations: Secondary | ICD-10-CM | POA: Diagnosis not present

## 2024-05-16 DIAGNOSIS — R0989 Other specified symptoms and signs involving the circulatory and respiratory systems: Secondary | ICD-10-CM | POA: Diagnosis not present

## 2024-05-16 DIAGNOSIS — I480 Paroxysmal atrial fibrillation: Secondary | ICD-10-CM | POA: Diagnosis not present

## 2024-05-16 NOTE — Progress Notes (Signed)
 "  Established Patient Office Visit  Subjective:  Patient ID: Clinton Ortiz, male    DOB: [birth year]  Age: 84 y.o. MRN: [id number]  Chief Complaint  Patient presents with   Follow-up    HPI  Discussed the use of AI scribe software for clinical note transcription with the patient, who gave verbal consent to proceed.  History of Present Illness   Clinton Ortiz is an 84 year old male with hypertension and atrial fibrillation who presents with concerns about elevated blood pressure and sleep disturbances.  He has been experiencing elevated blood pressure readings at home and has had a couple of falls, including one in a grocery store due to feeling lightheaded and weak. He checks his blood pressure before taking his medications in the morning. He is currently on metoprolol  for atrial fibrillation. No changes in physical activity or diet have been noted. His home BP readings fluctuate between 117-140s/ 60-90s. In office reading today is good.  He reports significant sleep disturbances, stating he only sleeps a couple of hours before waking up. He has a history of sleep apnea but is not using a CPAP machine due to mask fit issues related to his beard. A sleep study was conducted approximately two years ago.  He has a chronic cough and shortness of breath, for which he uses inhalers, but they have not been effective. He recently saw his pulmonologist who stopped his singulair.  He monitors his blood sugar at home and reports stable levels since starting glipizide , though he occasionally experiences low readings in the 80s or 90s, which he manages with CHO intake.       Patient Active Problem List   Diagnosis Date Noted   DISH (diffuse idiopathic skeletal hyperostosis) 03/15/2024   CKD stage G4/A1, GFR 15-29 and albumin  creatinine ratio <30 mg/g (HCC) 10/08/2022   Cellulitis of back 08/21/2022   Chronic idiopathic constipation 05/21/2022   Primary osteoarthritis of left hip 07/02/2021   Head trauma  01/29/2021   Obstructive sleep apnea 11/28/2020   Bilateral hearing loss due to cerumen impaction 11/28/2020   Acquired elevated hemidiaphragm 09/02/2019   Secondary hypercoagulable state 08/29/2019   Atrial fibrillation (HCC) 08/24/2019   Insomnia secondary to situational depression 08/19/2019   S/P CABG x 4 07/12/2019   Left main coronary artery disease 07/01/2019   Right foot injury 06/15/2019   Subclinical hypothyroidism 10/20/2018   Plantar fasciitis, bilateral 05/07/2018   Sepsis due to urinary tract infection (HCC) 06/22/2017   CKD stage 3 due to type 2 diabetes mellitus and post unilateral nephrectomy for renal cell carcinoma 06/05/2017   LPRD (laryngopharyngeal reflux disease) 06/04/2017   Mass of right side of neck 09/13/2014   COPD (chronic obstructive pulmonary disease) (HCC) 06/01/2014   Controlled type 2 diabetes mellitus with diabetic nephropathy (HCC) 12/30/2013   Benign prostatic hyperplasia 12/28/2013   Morbid obesity (HCC) 12/28/2013   Annual physical exam 02/14/2013   Hypertension 09/02/2012   Hyperlipidemia 09/02/2012   History of renal cancer status post left nephrectomy 09/02/2012   Swelling, scrotum 09/02/2012   Lumbar spondylosis 12/29/2011   Past Medical History:  Diagnosis Date   Arthritis    CAD (coronary artery disease)    07/01/2019: LHC with LM disease. Needs CABG.   Cancer [facility]) 2013   left kidney   Complication of anesthesia    slow to wake up after kidney stone surgery   Constipation, chronic    COPD (chronic obstructive pulmonary disease) (HCC)    Diabetes mellitus (HCC)  Dyspnea    with exertion   Family history of adverse reaction to anesthesia    daughter couldn't talk for 3-4 days after anesthesia    Frequent urination    GERD (gastroesophageal reflux disease)    Headache    Hernia    History of kidney stones    Hyperlipidemia    Hypertension    Peripheral vascular disease    right leg blood clot 20 plus years ago   Pneumonia     Renal insufficiency    stage 2 (only 1 kidney) had Kidney cancer   Spondylolysis    Tongue cancer (HCC) 2011   Past Surgical History:  Procedure Laterality Date   CARDIOVERSION N/A 09/02/2019   Procedure: CARDIOVERSION;  Surgeon: Lonni Slain, MD;  Location: [facility] ENDOSCOPY;  Service: Cardiovascular;  Laterality: N/A;   CENTRAL VENOUS CATHETER INSERTION Right 07/12/2019   Procedure: Insertion Central Line Adult;  Surgeon: Fleeta Hanford Coy, MD;  Location: [facility] OR;  Service: Open Heart Surgery;  Laterality: Right;  Right subclavian    COLONOSCOPY     CORONARY ARTERY BYPASS GRAFT N/A 07/12/2019   Procedure: CORONARY ARTERY BYPASS GRAFTING (CABG), ON PUMP, TIMES FOUR , USING LEFT INTERNAL MAMMARY ARTERY AND ENDOSCOPICALLY HARVESTED BILATERAL GREATER SAPHENOUS VEINS;  Surgeon: Fleeta Hanford Coy, MD;  Location: [facility] OR;  Service: Open Heart Surgery;  Laterality: N/A;   HERNIA REPAIR     abdominal   KIDNEY STONE SURGERY     LEFT HEART CATH AND CORONARY ANGIOGRAPHY N/A 07/01/2019   Procedure: LEFT HEART CATH AND CORONARY ANGIOGRAPHY;  Surgeon: Claudene Victory ORN, MD;  Location: [facility] INVASIVE CV LAB;  Service: Cardiovascular;  Laterality: N/A;   NEPHRECTOMY Left 10/08/2011   s/p robotic assisted lap left radical nephrectomy 10/08/11   PARTIAL GLOSSECTOMY  12/07/2009   s/p partial glossectomy for SCC right tongue 12/07/09   TEE WITHOUT CARDIOVERSION N/A 07/12/2019   Procedure: TRANSESOPHAGEAL ECHOCARDIOGRAM (TEE);  Surgeon: Fleeta Hanford, Coy, MD;  Location: [facility] OR;  Service: Open Heart Surgery;  Laterality: N/A;   Social History[1]    ROS: as noted in HPI  Objective:     BP 116/79   Pulse 95   Ht 5' 7 (1.702 m)   Wt 235 lb (106.6 kg)   SpO2 94%   BMI 36.81 kg/m  BP Readings from Last 3 Encounters:  05/16/24 116/79  03/15/24 112/70  12/10/23 111/68   Wt Readings from Last 3 Encounters:  05/16/24 235 lb (106.6 kg)  03/15/24 234 lb (106.1 kg)  12/10/23 238 lb (108 kg)      Physical  Exam Vitals and nursing note reviewed. Exam conducted with a chaperone present.  Constitutional:      General: He is not in acute distress.    Appearance: Normal appearance. He is not ill-appearing, toxic-appearing or diaphoretic.  HENT:     Head: Normocephalic and atraumatic.     Right Ear: External ear normal.     Left Ear: External ear normal.     Nose: Nose normal. No congestion.     Mouth/Throat:     Mouth: Mucous membranes are moist.     Pharynx: No oropharyngeal exudate or posterior oropharyngeal erythema.  Eyes:     General: No scleral icterus.       Right eye: No discharge.        Left eye: No discharge.     Pupils: Pupils are equal, round, and reactive to light.  Cardiovascular:     Rate and Rhythm:  Normal rate and regular rhythm.  Pulmonary:     Effort: Pulmonary effort is normal. No respiratory distress.     Breath sounds: Wheezing (scant posterior) present.  Abdominal:     Palpations: Abdomen is soft.     Tenderness: There is no guarding or rebound.  Musculoskeletal:     Cervical back: Normal range of motion and neck supple. No tenderness.     Right lower leg: No edema.     Left lower leg: No edema.     Right foot: Normal range of motion. No deformity.     Left foot: Normal range of motion. No deformity.  Feet:     Right foot:     Protective Sensation: 10 sites tested.  10 sites sensed.     Skin integrity: Skin integrity normal.     Left foot:     Protective Sensation: 10 sites tested.  10 sites sensed.     Skin integrity: Skin integrity normal.  Lymphadenopathy:     Cervical: No cervical adenopathy.  Skin:    General: Skin is warm and dry.     Coloration: Skin is not jaundiced.     Findings: No bruising, erythema or rash.  Neurological:     General: No focal deficit present.     Mental Status: He is alert and oriented to person, place, and time.     Sensory: No sensory deficit.  Psychiatric:        Behavior: Behavior normal.      No results found  for any visits on 05/16/24.  Last CBC Lab Results  Component Value Date   WBC 8.9 10/07/2022   HGB 13.5 10/07/2022   HCT 41.0 10/07/2022   MCV 92.6 10/07/2022   MCH 30.5 10/07/2022   RDW 12.4 10/07/2022   PLT 214 10/07/2022   Last metabolic panel Lab Results  Component Value Date   GLUCOSE 134 (H) 10/07/2022   NA 137 10/07/2022   K 4.3 10/07/2022   CL 103 10/07/2022   CO2 21 10/07/2022   BUN 28 (H) 10/07/2022   CREATININE 2.37 (H) 10/07/2022   EGFR 12 05/11/2023   CALCIUM  9.5 10/07/2022   PHOS 3.4 07/14/2019   PROT 6.6 10/07/2022   ALBUMIN  2.6 (L) 07/19/2019   BILITOT 0.4 10/07/2022   ALKPHOS 63 07/19/2019   AST 17 10/07/2022   ALT 15 10/07/2022   ANIONGAP 15 08/29/2019   Last lipids Lab Results  Component Value Date   CHOL 163 05/01/2022   HDL 41 05/01/2022   LDLCALC 83 05/01/2022   TRIG 324 (H) 05/01/2022   CHOLHDL 4.0 05/01/2022   Last hemoglobin A1c Lab Results  Component Value Date   HGBA1C 6.1 12/10/2023   Last thyroid  functions Lab Results  Component Value Date   TSH 3.610 03/15/2024   FREET4 1.09 03/15/2024   Last vitamin D No results found for: 25OHVITD2, 25OHVITD3, VD25OH Last vitamin B12 and Folate No results found for: VITAMINB12, FOLATE    The ASCVD Risk score (Arnett DK, et al., 2019) failed to calculate for the following reasons:   The 2019 ASCVD risk score is only valid for ages 13 to 54   * - Cholesterol units were assumed  Assessment & Plan:  Controlled type 2 diabetes mellitus with diabetic nephropathy (HCC) -     CMP14+EGFR -     CBC with Differential/Platelet -     Microalbumin / creatinine urine ratio -     Hemoglobin A1c -  HM Diabetes Foot Exam  Fall, subsequent encounter  CKD stage G4/A1, GFR 15-29 and albumin  creatinine ratio <30 mg/g (HCC)  Hypothyroidism, unspecified type  Pulmonary vascular congestion -     Pro b natriuretic peptide (BNP)  Obstructive sleep apnea -     For home use only DME  continuous positive airway pressure (CPAP)  Malignant neoplasm of left kidney (HCC)  Chronic obstructive pulmonary disease, unspecified COPD type (HCC)  Paroxysmal atrial fibrillation (HCC)  Morbid obesity (HCC)  Assessment and Plan    Type 2 diabetes mellitus with diabetic nephropathy Blood glucose well-controlled with glipizide . Occasional morning hypoglycemia managed with orange juice. - Continue glipizide . - Obtained urine sample for urine microalbumin. - foot exam completed today, no concerning findings  Hypertension Elevated home blood pressure readings; office reading normal. Uncertain accuracy of home cuff. - Scheduled nurse visit to compare home blood pressure cuff with office readings. - Consider obtaining new blood pressure cuff if home readings are inaccurate.  Atrial fibrillation Managed with metoprolol . Previous echocardiogram normal. - Ordered specific lab test to assess cardiac function. - Consider updating echocardiogram if lab test indicates cardiac involvement.  Chronic cough and dyspnea Persistent symptoms. Previous imaging showed pulmonary vascular engorgement and small pleural effusion. Possible cardiac contribution. - Ordered pro-bnp today - Consider updating echocardiogram if lab test indicates cardiac involvement. Last one in 2021 with normal EF at the time  Obstructive sleep apnea Previous CPAP use discontinued due to mask fit issues. Discussed CPAP benefits for pulmonary, cardiac, and blood pressure management. - Explore options for obtaining new CPAP machine with nasal pillow mask. Will need autotitrating machine.  CKD 4 with hx of RCC s/p nephrectomy Monitor labs.   Falls None since last visit. Encouraged adequate hydration and use of assistive devices for ambulation. Monitor for now, consider PT if recurrent.        Return in about 4 months (around 09/13/2024).   Benton LITTIE Gave, PA     [1]  Social History Tobacco Use   Smoking  status: Former    Current packs/day: 1.00    Average packs/day: 1 pack/day for 2.0 years (2.0 ttl pk-yrs)    Types: Cigarettes   Smokeless tobacco: Never   Tobacco comments:    smoked 3 years as a teenager  Substance Use Topics   Alcohol use: No   Drug use: No   "

## 2024-05-16 NOTE — Patient Instructions (Signed)
 Continue all your medications as currently prescribed.  Please come in for a nurse visit to assess the accuracy of your blood pressure cuff.  Follow up with me in 4 months, sooner pending lab results.

## 2024-05-16 NOTE — Progress Notes (Signed)
"  °  Subjective:  Patient ID: Clinton Ortiz, male    DOB: 1940/08/27,   MRN: [id number]  Chief Complaint  Patient presents with   Diabetes    She's going to cut them toenails.   [place]aw [place], [place] - 05/16/2024; A1c - 6.1 July 25     83 y.o. male presents for concern of thickened elongated and painful nails that are difficult to trim. Requesting to have them trimmed today. Relates burning and tingling in their feet. Patient is diabetic and last A1c was  Lab Results  Component Value Date   HGBA1C 6.1 12/10/2023   .   PCP:  Lowella Benton CROME, PA    . Denies any other pedal complaints. Denies n/v/f/c.   Past Medical History:  Diagnosis Date   Arthritis    CAD (coronary artery disease)    07/01/2019: LHC with LM disease. Needs CABG.   Cancer [facility]) 2013   left kidney   Complication of anesthesia    slow to wake up after kidney stone surgery   Constipation, chronic    COPD (chronic obstructive pulmonary disease) (HCC)    Diabetes mellitus (HCC)    Dyspnea    with exertion   Family history of adverse reaction to anesthesia    daughter couldn't talk for 3-4 days after anesthesia    Frequent urination    GERD (gastroesophageal reflux disease)    Headache    Hernia    History of kidney stones    Hyperlipidemia    Hypertension    Peripheral vascular disease    right leg blood clot 20 plus years ago   Pneumonia    Renal insufficiency    stage 2 (only 1 kidney) had Kidney cancer   Spondylolysis    Tongue cancer (HCC) 2011    Objective:  Physical Exam: .Vascular: DP/PT pulses 2/4 bilateral. CFT <3 seconds. Absent hair growth on digits. Edema noted to bilateral lower extremities. Xerosis noted bilaterally.  Skin. No lacerations or abrasions bilateral feet. Nails 1-5 bilateral  are thickened discolored and elongated with subungual debris.  Musculoskeletal: MMT 5/5 bilateral lower extremities in DF, PF, Inversion and Eversion. Deceased ROM in DF of ankle joint.  Neurological:  Sensation intact to light touch. Protective sensation diminished bilateral.    Assessment:   1. Pain due to onychomycosis of toenails of both feet      Plan:  Patient was evaluated and treated and all questions answered. -Discussed and educated patient on diabetic foot care, especially with  regards to the vascular, neurological and musculoskeletal systems.  -Stressed the importance of good glycemic control and the detriment of not  controlling glucose levels in relation to the foot. -Discussed supportive shoes at all times and checking feet regularly.  -Mechanically debrided all nails 1-5 bilateral using sterile nail nipper and filed with dremel without incident  -Answered all patient questions -Patient to return  in 3 months for at risk foot care -Patient advised to call the office if any problems or questions arise in the meantime.   Asberry Failing, DPM    "

## 2024-05-17 ENCOUNTER — Encounter: Payer: Self-pay | Admitting: Urgent Care

## 2024-05-17 ENCOUNTER — Ambulatory Visit: Payer: Self-pay | Admitting: Urgent Care

## 2024-05-17 LAB — CMP14+EGFR
ALT: 25 IU/L (ref 0–44)
AST: 24 IU/L (ref 0–40)
Albumin: 4.2 g/dL (ref 3.7–4.7)
Alkaline Phosphatase: 111 IU/L (ref 48–129)
BUN/Creatinine Ratio: 14 (ref 10–24)
BUN: 33 mg/dL — ABNORMAL HIGH (ref 8–27)
Bilirubin Total: 0.4 mg/dL (ref 0.0–1.2)
CO2: 21 mmol/L (ref 20–29)
Calcium: 9.5 mg/dL (ref 8.6–10.2)
Chloride: 106 mmol/L (ref 96–106)
Creatinine, Ser: 2.38 mg/dL — ABNORMAL HIGH (ref 0.76–1.27)
Globulin, Total: 2.4 g/dL (ref 1.5–4.5)
Glucose: 54 mg/dL — ABNORMAL LOW (ref 70–99)
Potassium: 5.2 mmol/L (ref 3.5–5.2)
Sodium: 144 mmol/L (ref 134–144)
Total Protein: 6.6 g/dL (ref 6.0–8.5)
eGFR: 27 mL/min/1.73 — ABNORMAL LOW

## 2024-05-17 LAB — CBC WITH DIFFERENTIAL/PLATELET
Basophils Absolute: 0 x10E3/uL (ref 0.0–0.2)
Basos: 1 %
EOS (ABSOLUTE): 0.2 x10E3/uL (ref 0.0–0.4)
Eos: 2 %
Hematocrit: 50.2 % (ref 37.5–51.0)
Hemoglobin: 16.6 g/dL (ref 13.0–17.7)
Immature Grans (Abs): 0 x10E3/uL (ref 0.0–0.1)
Immature Granulocytes: 0 %
Lymphocytes Absolute: 1.2 x10E3/uL (ref 0.7–3.1)
Lymphs: 15 %
MCH: 32.4 pg (ref 26.6–33.0)
MCHC: 33.1 g/dL (ref 31.5–35.7)
MCV: 98 fL — ABNORMAL HIGH (ref 79–97)
Monocytes Absolute: 0.9 x10E3/uL (ref 0.1–0.9)
Monocytes: 11 %
Neutrophils Absolute: 5.7 x10E3/uL (ref 1.4–7.0)
Neutrophils: 71 %
Platelets: 169 x10E3/uL (ref 150–450)
RBC: 5.13 x10E6/uL (ref 4.14–5.80)
RDW: 12.8 % (ref 11.6–15.4)
WBC: 8 x10E3/uL (ref 3.4–10.8)

## 2024-05-17 LAB — HEMOGLOBIN A1C
Est. average glucose Bld gHb Est-mCnc: 134 mg/dL
Hgb A1c MFr Bld: 6.3 % — ABNORMAL HIGH (ref 4.8–5.6)

## 2024-05-17 LAB — MICROALBUMIN / CREATININE URINE RATIO
Creatinine, Urine: 146.7 mg/dL
Microalb/Creat Ratio: 5 mg/g{creat} (ref 0–29)
Microalbumin, Urine: 7.8 ug/mL

## 2024-05-17 LAB — SPECIMEN STATUS REPORT

## 2024-05-17 LAB — PRO B NATRIURETIC PEPTIDE: NT-Pro BNP: 418 pg/mL (ref 0–486)

## 2024-05-20 ENCOUNTER — Encounter: Payer: Self-pay | Admitting: Cardiology

## 2024-05-25 ENCOUNTER — Ambulatory Visit (INDEPENDENT_AMBULATORY_CARE_PROVIDER_SITE_OTHER)

## 2024-05-25 VITALS — BP 124/81 | HR 97 | Resp 18 | Ht 67.0 in | Wt 235.0 lb

## 2024-05-25 DIAGNOSIS — I10 Essential (primary) hypertension: Secondary | ICD-10-CM

## 2024-05-25 NOTE — Progress Notes (Signed)
" ° °  Subjective:    Patient ID: Clinton Ortiz, male    DOB: [birth year], 84 y.o.   MRN: [id number]  HPI  Patient here for a BP check. He has been getting high readings in the AM. He did state that he takes it before taking his BP medication. Denies CP, SOB, heart palpitations, vision changes.  Review of Systems     Objective:   Physical Exam        Assessment & Plan:   First BP reading is 120/70 with our cuff and 124/81 with his. Reported to [facility] who is satisfied with these readings. Recommended patient continue to monitor and home after taking medication and sitting for about an hour before taking it. Pt voiced his understanding "

## 2024-06-09 ENCOUNTER — Telehealth: Payer: Self-pay

## 2024-06-09 NOTE — Telephone Encounter (Signed)
 [facility] sent a fax requesting patient's sleep study results. Patient had sleep study at [facility] and was told that [facility] can access [facility] to get the sleep study results.   Call to Danielle at [facility] to let her know they should be able to access [facility] to get the sleep study results per the [facility] office.

## 2024-06-10 ENCOUNTER — Encounter: Admitting: Sports Medicine

## 2024-06-17 ENCOUNTER — Other Ambulatory Visit: Payer: Self-pay

## 2024-06-17 DIAGNOSIS — E7800 Pure hypercholesterolemia, unspecified: Secondary | ICD-10-CM

## 2024-06-17 MED ORDER — PRAVASTATIN SODIUM 80 MG PO TABS: 80.0000 mg | ORAL_TABLET | Freq: Every day | ORAL | 3 refills | Status: AC

## 2024-08-15 ENCOUNTER — Ambulatory Visit: Admitting: Podiatry

## 2024-09-13 ENCOUNTER — Ambulatory Visit: Admitting: Urgent Care

## 2024-09-26 ENCOUNTER — Ambulatory Visit: Admitting: Cardiology
